# Patient Record
Sex: Female | Born: 1935
Health system: Southern US, Community
[De-identification: ages and names within clinical notes are randomized; demographics above are authoritative.]

## PROBLEM LIST (undated history)

## (undated) DIAGNOSIS — C50919 Malignant neoplasm of unspecified site of unspecified female breast: Secondary | ICD-10-CM

## (undated) DIAGNOSIS — H269 Unspecified cataract: Secondary | ICD-10-CM

## (undated) DIAGNOSIS — I10 Essential (primary) hypertension: Secondary | ICD-10-CM

## (undated) DIAGNOSIS — B159 Hepatitis A without hepatic coma: Secondary | ICD-10-CM

## (undated) DIAGNOSIS — R4189 Other symptoms and signs involving cognitive functions and awareness: Secondary | ICD-10-CM

## (undated) DIAGNOSIS — I639 Cerebral infarction, unspecified: Secondary | ICD-10-CM

## (undated) DIAGNOSIS — L253 Unspecified contact dermatitis due to other chemical products: Secondary | ICD-10-CM

## (undated) DIAGNOSIS — H3530 Unspecified macular degeneration: Secondary | ICD-10-CM

## (undated) DIAGNOSIS — H409 Unspecified glaucoma: Secondary | ICD-10-CM

## (undated) DIAGNOSIS — R7303 Prediabetes: Secondary | ICD-10-CM

## (undated) DIAGNOSIS — F419 Anxiety disorder, unspecified: Secondary | ICD-10-CM

## (undated) DIAGNOSIS — H5316 Psychophysical visual disturbances: Secondary | ICD-10-CM

## (undated) DIAGNOSIS — F0150 Vascular dementia without behavioral disturbance: Secondary | ICD-10-CM

## (undated) DIAGNOSIS — F411 Generalized anxiety disorder: Secondary | ICD-10-CM

## (undated) DIAGNOSIS — E785 Hyperlipidemia, unspecified: Secondary | ICD-10-CM

## (undated) DIAGNOSIS — K644 Residual hemorrhoidal skin tags: Secondary | ICD-10-CM

## (undated) DIAGNOSIS — M25559 Pain in unspecified hip: Secondary | ICD-10-CM

## (undated) DIAGNOSIS — I6990 Unspecified sequelae of unspecified cerebrovascular disease: Secondary | ICD-10-CM

## (undated) DIAGNOSIS — E559 Vitamin D deficiency, unspecified: Secondary | ICD-10-CM

## (undated) DIAGNOSIS — G453 Amaurosis fugax: Secondary | ICD-10-CM

## (undated) DIAGNOSIS — M21619 Bunion of unspecified foot: Secondary | ICD-10-CM

## (undated) DIAGNOSIS — R21 Rash and other nonspecific skin eruption: Secondary | ICD-10-CM

## (undated) DIAGNOSIS — K219 Gastro-esophageal reflux disease without esophagitis: Secondary | ICD-10-CM

## (undated) DIAGNOSIS — R609 Edema, unspecified: Secondary | ICD-10-CM

## (undated) DIAGNOSIS — R1310 Dysphagia, unspecified: Secondary | ICD-10-CM

## (undated) HISTORY — DX: Edema, unspecified: R60.9

## (undated) HISTORY — DX: Vitamin D deficiency, unspecified: E55.9

## (undated) HISTORY — DX: Essential (primary) hypertension: I10

## (undated) HISTORY — DX: Unspecified sequelae of unspecified cerebrovascular disease: I69.90

## (undated) HISTORY — DX: Unspecified contact dermatitis due to other chemical products: L25.3

## (undated) HISTORY — DX: Generalized anxiety disorder: F41.1

## (undated) HISTORY — DX: Unspecified cataract: H26.9

## (undated) HISTORY — DX: Gastro-esophageal reflux disease without esophagitis: K21.9

## (undated) HISTORY — DX: Residual hemorrhoidal skin tags: K64.4

## (undated) HISTORY — DX: Rash and other nonspecific skin eruption: R21

## (undated) HISTORY — DX: Bunion of unspecified foot: M21.619

## (undated) HISTORY — DX: Pain in unspecified hip: M25.559

## (undated) HISTORY — DX: Hepatitis a without hepatic coma: B15.9

## (undated) HISTORY — DX: Malignant neoplasm of unspecified site of unspecified female breast: C50.919

---

## 1970-05-23 HISTORY — PX: CHOLECYSTECTOMY: SHX55

## 1993-05-23 HISTORY — PX: BREAST LUMPECTOMY: SHX2

## 2002-05-23 HISTORY — PX: CARDIAC CATHETERIZATION: SHX172

## 2011-06-09 ENCOUNTER — Other Ambulatory Visit: Payer: Self-pay | Admitting: Internal Medicine

## 2011-06-09 DIAGNOSIS — Z780 Asymptomatic menopausal state: Secondary | ICD-10-CM

## 2011-06-16 ENCOUNTER — Other Ambulatory Visit: Payer: Self-pay

## 2012-08-13 ENCOUNTER — Encounter: Payer: Self-pay | Admitting: Internal Medicine

## 2012-08-13 ENCOUNTER — Ambulatory Visit (INDEPENDENT_AMBULATORY_CARE_PROVIDER_SITE_OTHER): Payer: Medicare Other | Admitting: Internal Medicine

## 2012-08-13 VITALS — BP 164/78 | HR 88 | Temp 97.8°F | Resp 20 | Ht 63.5 in | Wt 159.6 lb

## 2012-08-13 DIAGNOSIS — W1809XA Striking against other object with subsequent fall, initial encounter: Secondary | ICD-10-CM

## 2012-08-13 DIAGNOSIS — N390 Urinary tract infection, site not specified: Secondary | ICD-10-CM

## 2012-08-13 DIAGNOSIS — S0190XA Unspecified open wound of unspecified part of head, initial encounter: Secondary | ICD-10-CM

## 2012-08-13 MED ORDER — CIPROFLOXACIN HCL 500 MG PO TABS: 500.0000 mg | ORAL_TABLET | Freq: Two times a day (BID) | ORAL | Status: DC

## 2012-08-13 NOTE — Progress Notes (Signed)
Subjective:    Patient ID: Beth Foster, female    DOB: Jan 19, 1929, 79 y.o.   MRN: [id number]  HPI 79 yo female presents today with concerns about urinary frequency and dysuria. She fell this am.   Took azo for 2 days and one evening.  Helped in some ways, but gave her diarrhea.  Felt tachycardia.  Did not improve.  Did not take the azo this am--went 2-3 x this am.  Felt like she had a fever.  [place] Spaniel it was hot in waiting room.  Unable to make urine here b/c she says she will have a bm also.  Using depends.  Has been going on 3 nights and had incontinence.    Larey Seat this am and hit back of head on a door.  Back of head fell on threshold.  Says she was about to make a cup of coffee to take her medicine.  Turned around, felt a funny feeling, then fell. Bit inside of her lip also and it's sore.  Didn't injure arms or legs specifically though.  Has chronic left upper arm pain, but no changes.    BP unchanged.  Will not take additional meds and readings range in 140s-160s systolic.     Review of Systems General:  Denies fatigue, denies weight loss Skin:  Has chronic eczema, has laceration over posterior scalp that requires attention today s/p fall this am HEENT:  Notes headaches (has chronically with allergic rhinitis), denies visual changes, denies hearing loss, c/o chronic nasal congestion, denies sore throat, denies difficulty swallowing CV:  Denies chest pain, denies dyspnea on exertion, denies orthopnea, denies PND, denies edema Pulm:  Denies shortness of breath, denies wheezing, some chronic cough GI:  Denies abdominal pain, denies constipation, denies diarrhea, denies melena, denies hematochezia, denies nausea, denies vomiting GU:  c/o urinary frequency, c/o urinary urgency, c/o dysuria, c/o nocturia, c/o urinary incontinence Neurologic:  Denies paresthesias, denies new focal weakness MSK:  Denies joint pain, denies back pain, denies muscle pain, some chronic left upper arm discomfort Hematology:   Had bleeding from striking head this am, denies bruising Immunology:  Denies recurrent infections Psychiatry:  Denies changes in memory, denies depression, c/o anxiety     Objective:   Physical Exam  General:  Well-developed, well-nourished, well-groomed pt, not her usual self, is flushed, anxious Skin: 2 inch long laceration over posterior scalp which had stopped bleeding after area was cleaned with saline, area was cleansed, tincture of benzoin applied and steristrips to scalp, no bruises present at this time HEENT:   PERRLA, EOMI, some clear nasal drainage,mild PND CV:  RRR, no M/G/R, +S1/S2 Pulm:  Lungs CTA, no r/r/w Abd:  Soft, nondistended, nontender, no rebound or guarding, no palpable hepatosplenomegaly GU:  Bladder nontender, nondistended, no CVA tenderness MSK:  Mild right-sided rib pain, 5/5 strength in all 4 extremities with normal ROM, gait steady w/o use of assistive device Neuro:  CNs II-XII grossly intact, no focal deficits, no tremors Psych:  AAOx3, anxious today     Assessment & Plan:  1.  UTI:  Urine dipstick was grossly positive for UTI with >500 wbc, positive nitrite, 250 blood, and was orange in color from azo and cloudy --was started on a week's course of cipro --encouraged hydration with water and cranberry juice as well as yogurt 2.  H/o Fall:  Advised to take it easy over the next week until she recuperates from her infection, if remains weak, will advise PT 3.  Open wound to her head:  Was cleaned and dressed today with steristrips.  If is not healing, may require suturing, but bleeding had stopped.  Advised to avoid touching the area or washing her hair until I see her again next week to reevaluate the wound.  Let us know if she develops new weakness, increased confusion, signs of intracranial bleeding.    Pt's son had brought her to appt and all of this was also reviewed with him today and he expressed understanding.

## 2012-08-13 NOTE — Patient Instructions (Signed)
Please be extra careful walking around the next few days.    We will send your urine for a culture to identify the bacteria causing the infection.  I have given you antibiotics because you seem quite sick and I don't want your infection to get worse while we wait for the culture (may take a few days).  Please keep drinking plenty of water.    Do not mess with your wound on your head.  I will see you again in a week to see how it looks.

## 2012-08-15 LAB — CBC WITH DIFFERENTIAL/PLATELET
Basophils Absolute: 0 10*3/uL (ref 0.0–0.2)
Basos: 0 % (ref 0–3)
Eos: 1 % (ref 0–5)
Eosinophils Absolute: 0.1 10*3/uL (ref 0.0–0.4)
HCT: 40 % (ref 34.0–46.6)
Hemoglobin: 13.5 g/dL (ref 11.1–15.9)
Immature Grans (Abs): 0 10*3/uL (ref 0.0–0.1)
Immature Granulocytes: 0 % (ref 0–2)
Lymphocytes Absolute: 1.7 10*3/uL (ref 0.7–3.1)
Lymphs: 19 % (ref 14–46)
MCH: 31.3 pg (ref 26.6–33.0)
MCHC: 33.8 g/dL (ref 31.5–35.7)
MCV: 93 fL (ref 79–97)
Monocytes Absolute: 0.9 10*3/uL (ref 0.1–0.9)
Monocytes: 10 % (ref 4–12)
Neutrophils Absolute: 6.2 10*3/uL (ref 1.4–7.0)
Neutrophils Relative %: 70 % (ref 40–74)
RBC: 4.32 x10E6/uL (ref 3.77–5.28)
RDW: 13.2 % (ref 12.3–15.4)
WBC: 8.9 10*3/uL (ref 3.4–10.8)

## 2012-08-15 LAB — URINE CULTURE

## 2012-08-15 LAB — BASIC METABOLIC PANEL
BUN/Creatinine Ratio: 23 (ref 11–26)
BUN: 13 mg/dL (ref 8–27)
CO2: 23 mmol/L (ref 19–28)
Calcium: 9.2 mg/dL (ref 8.6–10.2)
Chloride: 100 mmol/L (ref 97–108)
Creatinine, Ser: 0.57 mg/dL (ref 0.57–1.00)
GFR calc Af Amer: 101 mL/min/{1.73_m2} (ref >59)
GFR calc non Af Amer: 87 mL/min/{1.73_m2} (ref >59)
Glucose: 96 mg/dL (ref 65–99)
Potassium: 3.9 mmol/L (ref 3.5–5.2)
Sodium: 138 mmol/L (ref 134–144)

## 2012-08-17 ENCOUNTER — Other Ambulatory Visit: Payer: Self-pay | Admitting: *Deleted

## 2012-08-17 DIAGNOSIS — I10 Essential (primary) hypertension: Secondary | ICD-10-CM

## 2012-08-17 DIAGNOSIS — E559 Vitamin D deficiency, unspecified: Secondary | ICD-10-CM

## 2012-08-20 ENCOUNTER — Ambulatory Visit: Payer: Medicare Other | Admitting: Internal Medicine

## 2012-08-20 ENCOUNTER — Encounter: Payer: Self-pay | Admitting: Internal Medicine

## 2012-08-20 ENCOUNTER — Ambulatory Visit (INDEPENDENT_AMBULATORY_CARE_PROVIDER_SITE_OTHER): Payer: Medicare Other | Admitting: Internal Medicine

## 2012-08-20 VITALS — BP 160/68 | HR 70 | Temp 96.1°F | Resp 16 | Ht 63.5 in | Wt 154.7 lb

## 2012-08-20 DIAGNOSIS — S0190XA Unspecified open wound of unspecified part of head, initial encounter: Secondary | ICD-10-CM | POA: Insufficient documentation

## 2012-08-20 DIAGNOSIS — G3184 Mild cognitive impairment, so stated: Secondary | ICD-10-CM | POA: Insufficient documentation

## 2012-08-20 DIAGNOSIS — R413 Other amnesia: Secondary | ICD-10-CM

## 2012-08-20 DIAGNOSIS — R269 Unspecified abnormalities of gait and mobility: Secondary | ICD-10-CM

## 2012-08-20 DIAGNOSIS — S0190XD Unspecified open wound of unspecified part of head, subsequent encounter: Secondary | ICD-10-CM

## 2012-08-20 DIAGNOSIS — E559 Vitamin D deficiency, unspecified: Secondary | ICD-10-CM | POA: Insufficient documentation

## 2012-08-20 DIAGNOSIS — Z5189 Encounter for other specified aftercare: Secondary | ICD-10-CM

## 2012-08-20 DIAGNOSIS — K219 Gastro-esophageal reflux disease without esophagitis: Secondary | ICD-10-CM

## 2012-08-20 DIAGNOSIS — N390 Urinary tract infection, site not specified: Secondary | ICD-10-CM

## 2012-08-20 NOTE — Assessment & Plan Note (Signed)
Had fall and was seen here last week.  Wound healing on head.  Balance improved some, but remains unsteady.  Discussed use of assistive device, but she is not keen on this.  Her granddaughter also would like a lightweight travel wheelchair for her for trips to the mall and long distances at the grocery store.  I ordered a PT, OT home health referral to assess home safety, assistive device and for exercise to help with balance.  She notes that she had home health therapy in the past with great benefit.  Handicapped tag also completed today so she will not have to walk so far.

## 2012-08-20 NOTE — Assessment & Plan Note (Signed)
Is no longer on vitamin D supplement per medication list.  Will recheck level before her upcoming visit.

## 2012-08-20 NOTE — Assessment & Plan Note (Signed)
Stable recently.  Continues on nexium which has been only successful PPI for this patient.

## 2012-08-20 NOTE — Assessment & Plan Note (Signed)
Dysuria and frequency resolved.  Using protective garments at night to prevent soiling her bed, but nocturia and incontinence seem to be resolved, as well.  Finishing her course of cipro, yogurt, adequate fluids.  Is generally feeling better now.

## 2012-08-20 NOTE — Progress Notes (Signed)
Patient ID: Beth Foster, female   DOB: Mar 13, 1929, 79 y.o.   MRN: [id number] Review of Systems  Constitutional: Negative for fever, chills and malaise/fatigue.  HENT: Negative for congestion.   Eyes: Negative for blurred vision.  Respiratory: Negative for cough and shortness of breath.   Cardiovascular: Negative for chest pain and palpitations.  Gastrointestinal: Negative for heartburn, nausea, vomiting, abdominal pain and constipation.       Gerd controlled with nexium  Genitourinary: Negative for dysuria, urgency and frequency.       See hpi.  Musculoskeletal: Negative for myalgias.  Skin: Negative for itching and rash.  Neurological: Positive for weakness. Negative for dizziness, loss of consciousness and headaches.       Poor balance.  No additional falls.  Endo/Heme/Allergies: Does not bruise/bleed easily.  Psychiatric/Behavioral: Positive for memory loss. Negative for depression. The patient is nervous/anxious. The patient does not have insomnia.    Physical Exam  Constitutional: She is oriented to person, place, and time. She appears well-developed and well-nourished.  HENT:  Head: Normocephalic.    Laceration/open wound on posterior scalp remains open in 1 cm area near inferior aspect of wound  Eyes: EOM are normal. Pupils are equal, round, and reactive to light.  Cardiovascular: Normal rate and regular rhythm.  Exam reveals no gallop and no friction rub.   No murmur heard. Pulmonary/Chest: Effort normal and breath sounds normal. No respiratory distress. She has no rales.  Abdominal: Soft. Bowel sounds are normal. She exhibits no distension. There is no tenderness.  Musculoskeletal: Normal range of motion. She exhibits no edema and no tenderness.  Neurological: She is alert and oriented to person, place, and time.  Gait still slightly unsteady, sways when walking  Skin: Skin is warm and dry.  See head picture  Psychiatric: She has a normal mood and affect.  anxious

## 2012-08-20 NOTE — Assessment & Plan Note (Signed)
Is healing slowly.  Still has a small ~1cm area open near the inferior portion of the wound.  I removed the steristrips today, cleansed the wound with normal saline and applied mupirocin cream to the open area.  Advised to keep open to heal.  She will wait until it closed to wash her hair.  I will reevaluate this 4/11.

## 2012-08-20 NOTE — Progress Notes (Signed)
Patient ID: Beth Foster, female   DOB: 08-18-1928, 79 y.o.   MRN: [id number] Code Status: Full code, has living will and granddaughter is her HCPOA  Allergies  Allergen Reactions  . Amoxicillin   . Cephalexin   . Clindamycin/Lincomycin   . Other     Beta blocker, ARBS, steroids  . Reglan (Metoclopramide)     Chief Complaint  Patient presents with  . Medical Managment of Chronic Issues  . check wound back of head    HPI: Patient is a 79 y.o. with h/o allergic rhinitis and eczema, cognitive impairment  Granddaughter to get necklace for her to wear to hit call button if she falls.    Gets winded easily.  Asks about traveling wheelchair and handicapped plaquard due to gait imbalance.    Blood pressure runs high and she will not take more medicine for it.  Dysuria improving.  Feels a lot better.  Drinking plenty of water now.  Has had some nocturnal incontinence.  No longer getting up 2-3 x per night since uti treatment.  Finishing course of cipro.  Review of Systems:  ROS   Past Medical History  Diagnosis Date  . GERD (gastroesophageal reflux disease)   . Hypertension   . Pain in joint, pelvic region and thigh   . Vitamin D deficiency   . Viral hepatitis A without mention of hepatic coma   . Malignant neoplasm of breast (female), unspecified site   . Anxiety state, unspecified   . Unspecified cataract   . Unspecified late effects of cerebrovascular disease   . External hemorrhoids without mention of complication   . Contact dermatitis and other eczema due to other chemical products   . Bunion   . Rash and other nonspecific skin eruption   . Edema    History reviewed. No pertinent past surgical history. Social History:   reports that she has never smoked. She does not have any smokeless tobacco history on file. She reports that she does not drink alcohol or use illicit drugs.  No family history on file.  Medications: Patient's Medications  New Prescriptions   No  medications on file  Previous Medications   CHOLECALCIFEROL (VITAMIN D) 1000 UNITS TABLET    Take 1,000 Units by mouth daily. Take one tablet once a day for vitamin d supplement   CIPROFLOXACIN (CIPRO) 500 MG TABLET    Take 1 tablet (500 mg total) by mouth 2 (two) times daily.   DILTIAZEM (CARDIZEM) 120 MG TABLET    Take one tablet once a day for blood pressure   ESOMEPRAZOLE (NEXIUM) 20 MG CAPSULE    Take 20 mg by mouth daily before breakfast. Take one tablet once a day for acid reflux   MULTIPLE VITAMIN (MULTIVITAMIN) TABLET    Take 1 tablet by mouth daily. Take one tablet once a day  Modified Medications   No medications on file  Discontinued Medications   No medications on file    Physical Exam:  Filed Vitals:   08/20/12 1008  BP: 160/68  Pulse: 70  Temp: 96.1 F (35.6 C)  Resp: 16  Height: 5' 3.5" (1.613 m)  Weight: 154 lb 11.2 oz (70.171 kg)   Physical Exam    Labs reviewed: 08/01/2011 Lipid Panel; Cholesterol 212, Triglycerides 53, HDL 82, LDL 119 10/14/2011  BMP: glucose 102, BUN 10, Creatinine 0.60 02/29/2012  CMP: glucose 112, BUN 7, Creatinine 0.75 Lipid: Cholesterol 224, Triglycerides 76, HDL 88, LDL 121 Vitamin D: 21.7 05/30/2012 BMP; Glucose  105, BUN 8, Creatinine 0.73 CBC; WBC 5.3, RBC 4.54, HGB 14.5 Vit D 25.3 Basic Metabolic Panel:  Recent Labs  16/10/96 1647  NA 138  K 3.9  CL 100  CO2 23  GLUCOSE 96  BUN 13  CREATININE 0.57  CALCIUM 9.2   CBC:  Recent Labs  08/13/12 1647  WBC 8.9  NEUTROABS 6.2  HGB 13.5  HCT 40.0  MCV 93    Past Procedures: never had cscope--has had hemorrhoids last mammogram several years ago had bone density about 10 years ago had stress test with terrible pain reaction in chest, then had cath and got hypotensive--advised to have no further caths 08/18/11:  MMSE:  28/30, passed clock drawing here at [facility]  Assessment/Plan    Labs/tests ordered:  Has BMP, Vit D, bone density ordered for before visit  08/31/12

## 2012-08-20 NOTE — Assessment & Plan Note (Signed)
MMSE:  28/30, passed clock drawing here at [facility] Having some more difficulty being home alone.  See gait disorder.

## 2012-08-22 DIAGNOSIS — R269 Unspecified abnormalities of gait and mobility: Secondary | ICD-10-CM

## 2012-08-22 DIAGNOSIS — G622 Polyneuropathy due to other toxic agents: Secondary | ICD-10-CM

## 2012-08-22 DIAGNOSIS — I10 Essential (primary) hypertension: Secondary | ICD-10-CM

## 2012-08-22 DIAGNOSIS — G619 Inflammatory polyneuropathy, unspecified: Secondary | ICD-10-CM

## 2012-08-22 DIAGNOSIS — I6990 Unspecified sequelae of unspecified cerebrovascular disease: Secondary | ICD-10-CM

## 2012-08-29 ENCOUNTER — Other Ambulatory Visit: Payer: Medicare Other

## 2012-08-29 DIAGNOSIS — I10 Essential (primary) hypertension: Secondary | ICD-10-CM

## 2012-08-29 DIAGNOSIS — E559 Vitamin D deficiency, unspecified: Secondary | ICD-10-CM

## 2012-08-30 LAB — BASIC METABOLIC PANEL
BUN/Creatinine Ratio: 17 (ref 11–26)
BUN: 13 mg/dL (ref 8–27)
CO2: 25 mmol/L (ref 19–28)
Calcium: 9.9 mg/dL (ref 8.6–10.2)
Chloride: 103 mmol/L (ref 97–108)
Creatinine, Ser: 0.76 mg/dL (ref 0.57–1.00)
GFR calc Af Amer: 85 mL/min/{1.73_m2} (ref >59)
GFR calc non Af Amer: 74 mL/min/{1.73_m2} (ref >59)
Glucose: 110 mg/dL — ABNORMAL HIGH (ref 65–99)
Potassium: 5.5 mmol/L — ABNORMAL HIGH (ref 3.5–5.2)
Sodium: 142 mmol/L (ref 134–144)

## 2012-08-30 LAB — VITAMIN D 25 HYDROXY (VIT D DEFICIENCY, FRACTURES): Vit D, 25-Hydroxy: 24.9 ng/mL — ABNORMAL LOW (ref 30.0–100.0)

## 2012-08-31 ENCOUNTER — Ambulatory Visit (INDEPENDENT_AMBULATORY_CARE_PROVIDER_SITE_OTHER): Payer: Medicare Other | Admitting: Internal Medicine

## 2012-08-31 ENCOUNTER — Encounter: Payer: Self-pay | Admitting: Internal Medicine

## 2012-08-31 ENCOUNTER — Telehealth: Payer: Self-pay | Admitting: *Deleted

## 2012-08-31 VITALS — BP 162/88 | HR 79 | Temp 97.6°F | Resp 16 | Ht 63.5 in | Wt 158.6 lb

## 2012-08-31 DIAGNOSIS — Z5189 Encounter for other specified aftercare: Secondary | ICD-10-CM

## 2012-08-31 DIAGNOSIS — R269 Unspecified abnormalities of gait and mobility: Secondary | ICD-10-CM

## 2012-08-31 DIAGNOSIS — S0190XD Unspecified open wound of unspecified part of head, subsequent encounter: Secondary | ICD-10-CM

## 2012-08-31 DIAGNOSIS — E785 Hyperlipidemia, unspecified: Secondary | ICD-10-CM

## 2012-08-31 DIAGNOSIS — E559 Vitamin D deficiency, unspecified: Secondary | ICD-10-CM

## 2012-08-31 NOTE — Assessment & Plan Note (Signed)
Balance was affected during UTI. Does have some difficulty with it at baseline.  Started with therapy last week.

## 2012-08-31 NOTE — Telephone Encounter (Signed)
Patient came in this morning and saw Dr Renato Gails and her blood pressure was 162/88 she had not taken her blood  pressure at that time. Beth Foster( [facility])  stated that patient blood pressure is 166/84. I called Dr  Renato Gails and she stated that it is noted

## 2012-08-31 NOTE — Progress Notes (Signed)
Patient ID: Beth Foster, female   DOB: May 29, 1928, 79 y.o.   MRN: [id number]  Allergies  Allergen Reactions  . Amoxicillin   . Cephalexin   . Clindamycin/Lincomycin   . Other     Beta blocker, ARBS, steroids  . Reglan (Metoclopramide)     Chief Complaint  Patient presents with  . Medical Managment of Chronic Issues    HPI: Patient is a 79 y.o. Caucasian female seen in the office today for management of her chronic conditions.  Still having some headaches since her fall.  Balance is slowly getting better.  Still feels like she will fall backwards every once in a while.  Holds onto things.  She has no other new sequelae since her fall.    Right eye is bothering her and says it has since she's fallen though she has not mentioned it before this.  Tears up and feels like it's swollen.  Has been coughing more.    Hasn't taken her meds, coffee or breakfast.    Does tell me that she had previously had a hemorrhagic stroke 3 years ago which resolved spontaneously.    Her potassium was a little elevated this time.  Eats a daily banana b/c of her lasix.  Review of Systems:  Review of Systems  Constitutional: Negative for fever, chills, weight loss and malaise/fatigue.  HENT: Negative for congestion and neck pain.        Right eye watering  Eyes: Negative for blurred vision.  Respiratory: Positive for cough. Negative for wheezing.        Mild dyspnea on exertion  Cardiovascular: Negative for chest pain and leg swelling.  Gastrointestinal: Negative for constipation.  Genitourinary: Negative for dysuria, urgency and frequency.  Musculoskeletal: Negative for myalgias.  Skin: Negative for rash.       Wound healing  Neurological: Positive for headaches. Negative for dizziness, tingling, tremors, sensory change, speech change, focal weakness, seizures and loss of consciousness.       Some lightheadedness  Psychiatric/Behavioral: Positive for memory loss. Negative for depression. The patient is  nervous/anxious.      Past Medical History  Diagnosis Date  . GERD (gastroesophageal reflux disease)   . Hypertension   . Pain in joint, pelvic region and thigh   . Vitamin D deficiency   . Viral hepatitis A without mention of hepatic coma   . Malignant neoplasm of breast (female), unspecified site   . Anxiety state, unspecified   . Unspecified cataract   . Unspecified late effects of cerebrovascular disease   . External hemorrhoids without mention of complication   . Contact dermatitis and other eczema due to other chemical products   . Bunion   . Rash and other nonspecific skin eruption   . Edema    No past surgical history on file. Social History:   reports that she has never smoked. She does not have any smokeless tobacco history on file. She reports that she does not drink alcohol or use illicit drugs.  No family history on file.  Medications: Patient's Medications  New Prescriptions   No medications on file  Previous Medications   CHOLECALCIFEROL (VITAMIN D) 1000 UNITS TABLET    Take 1,000 Units by mouth daily. Take one tablet once a day for vitamin d supplement   DILTIAZEM (CARDIZEM) 120 MG TABLET    Take one tablet once a day for blood pressure   ESOMEPRAZOLE (NEXIUM) 20 MG CAPSULE    Take 20 mg by mouth daily before  breakfast. Take one tablet once a day for acid reflux   MULTIPLE VITAMIN (MULTIVITAMIN) TABLET    Take 1 tablet by mouth daily. Take one tablet once a day  Modified Medications   No medications on file  Discontinued Medications   CIPROFLOXACIN (CIPRO) 500 MG TABLET    Take 1 tablet (500 mg total) by mouth 2 (two) times daily.     Physical Exam:  Filed Vitals:   08/31/12 0825  BP: 162/88  Pulse: 79  Temp: 97.6 F (36.4 C)  TempSrc: Oral  Resp: 16  Height: 5' 3.5" (1.613 m)  Weight: 158 lb 9.6 oz (71.94 kg)   Physical Exam  Constitutional: She appears well-developed and well-nourished. No distress.  HENT:  Postnasal drip present  Eyes:  Conjunctivae and EOM are normal. Pupils are equal, round, and reactive to light. Right eye exhibits discharge. Left eye exhibits no discharge.  Cardiovascular: Normal rate, regular rhythm, normal heart sounds and intact distal pulses.  Exam reveals no gallop and no friction rub.   No murmur heard. Pulmonary/Chest: Effort normal and breath sounds normal. No respiratory distress. She has no wheezes.  Abdominal: Bowel sounds are normal.  Musculoskeletal: Normal range of motion. She exhibits no edema and no tenderness.  Neurological: She is alert. She has normal reflexes. No cranial nerve deficit. She exhibits normal muscle tone. Coordination normal.  Oriented x 2, not to exact date  Skin: Skin is warm and dry. She is not diaphoretic.  Psychiatric: She has a normal mood and affect. Her behavior is normal.   Labs reviewed: Basic Metabolic Panel:  Recent Labs  40/98/11 1647 08/29/12 0810  NA 138 142  K 3.9 5.5*  CL 100 103  CO2 23 25  GLUCOSE 96 110*  BUN 13 13  CREATININE 0.57 0.76  CALCIUM 9.2 9.9   CBC:  Recent Labs  08/13/12 1647  WBC 8.9  NEUTROABS 6.2  HGB 13.5  HCT 40.0  MCV 93   Assessment/Plan Wound, open, head Is now entirely closed.  Improving slowly overall.     Gait disorder Balance was affected during UTI. Does have some difficulty with it at baseline.  Started with therapy last week.    Unspecified vitamin D deficiency Encouraged to take her vitamin D supplement.  Hyperlipidemia Check flp before next visit.  Labs/tests ordered:  Cbc, cmp, flp before next visit.  Note, she has no neurologic deficits to suggest she's had any intracranial bleeding.  Suspect she's had a concussion and this is causing her headaches.

## 2012-08-31 NOTE — Assessment & Plan Note (Signed)
Encouraged to take her vitamin D supplement.

## 2012-08-31 NOTE — Assessment & Plan Note (Signed)
Is now entirely closed.  Improving slowly overall.

## 2012-09-03 ENCOUNTER — Telehealth: Payer: Self-pay | Admitting: *Deleted

## 2012-09-03 NOTE — Telephone Encounter (Signed)
Alveria Apley, Occupational Therapy  from [facility] and she stated that patient blood pressure was 170/90 Pulse 80. Patient stated that she did take her medications. Lynden Ang will not perform therapy for patient. They also would like an order for [facility] with wheels and seat   Lynden Ang [phone] cell [phone] Office

## 2012-09-04 ENCOUNTER — Encounter: Payer: Self-pay | Admitting: Internal Medicine

## 2012-09-04 NOTE — Telephone Encounter (Signed)
Written prescription was done for the walker due to gait disorder and recent fall.  Patient has refused any alternative or new medications to be added to her list.  She has multiple allergies and frequent adverse reactions.

## 2012-09-17 ENCOUNTER — Other Ambulatory Visit: Payer: Self-pay | Admitting: *Deleted

## 2012-09-17 MED ORDER — DILTIAZEM HCL 120 MG PO TABS: ORAL_TABLET | ORAL | Status: DC

## 2012-09-19 ENCOUNTER — Other Ambulatory Visit: Payer: Self-pay | Admitting: *Deleted

## 2012-09-19 ENCOUNTER — Telehealth: Payer: Self-pay | Admitting: *Deleted

## 2012-09-19 MED ORDER — DILTIAZEM HCL 120 MG PO TABS: ORAL_TABLET | ORAL | Status: DC

## 2012-09-19 NOTE — Telephone Encounter (Signed)
[facility] dropped patient occupational therapy only she has meet her goals.  She thinks Physical therapy is still seeing her

## 2012-09-20 ENCOUNTER — Other Ambulatory Visit: Payer: Self-pay | Admitting: *Deleted

## 2012-09-20 MED ORDER — DILTIAZEM HCL 120 MG PO TABS: ORAL_TABLET | ORAL | Status: DC

## 2012-10-09 ENCOUNTER — Telehealth: Payer: Self-pay | Admitting: *Deleted

## 2012-10-09 ENCOUNTER — Encounter (HOSPITAL_COMMUNITY): Payer: Self-pay | Admitting: Emergency Medicine

## 2012-10-09 ENCOUNTER — Emergency Department (HOSPITAL_COMMUNITY): Payer: Medicare Other

## 2012-10-09 ENCOUNTER — Emergency Department (HOSPITAL_COMMUNITY)
Admission: EM | Admit: 2012-10-09 | Discharge: 2012-10-09 | Disposition: A | Discharge status: Home / Self Care | Payer: Medicare Other | Attending: Emergency Medicine | Admitting: Emergency Medicine

## 2012-10-09 DIAGNOSIS — Z853 Personal history of malignant neoplasm of breast: Secondary | ICD-10-CM | POA: Insufficient documentation

## 2012-10-09 DIAGNOSIS — Z8719 Personal history of other diseases of the digestive system: Secondary | ICD-10-CM | POA: Insufficient documentation

## 2012-10-09 DIAGNOSIS — Z8739 Personal history of other diseases of the musculoskeletal system and connective tissue: Secondary | ICD-10-CM | POA: Insufficient documentation

## 2012-10-09 DIAGNOSIS — Z880 Allergy status to penicillin: Secondary | ICD-10-CM | POA: Insufficient documentation

## 2012-10-09 DIAGNOSIS — Z8669 Personal history of other diseases of the nervous system and sense organs: Secondary | ICD-10-CM | POA: Insufficient documentation

## 2012-10-09 DIAGNOSIS — I10 Essential (primary) hypertension: Secondary | ICD-10-CM | POA: Insufficient documentation

## 2012-10-09 DIAGNOSIS — E559 Vitamin D deficiency, unspecified: Secondary | ICD-10-CM | POA: Insufficient documentation

## 2012-10-09 DIAGNOSIS — Z872 Personal history of diseases of the skin and subcutaneous tissue: Secondary | ICD-10-CM | POA: Insufficient documentation

## 2012-10-09 DIAGNOSIS — R220 Localized swelling, mass and lump, head: Secondary | ICD-10-CM

## 2012-10-09 DIAGNOSIS — Y9389 Activity, other specified: Secondary | ICD-10-CM | POA: Insufficient documentation

## 2012-10-09 DIAGNOSIS — Z79899 Other long term (current) drug therapy: Secondary | ICD-10-CM | POA: Insufficient documentation

## 2012-10-09 DIAGNOSIS — Z8619 Personal history of other infectious and parasitic diseases: Secondary | ICD-10-CM | POA: Insufficient documentation

## 2012-10-09 DIAGNOSIS — S0990XA Unspecified injury of head, initial encounter: Secondary | ICD-10-CM | POA: Insufficient documentation

## 2012-10-09 DIAGNOSIS — Z8679 Personal history of other diseases of the circulatory system: Secondary | ICD-10-CM | POA: Insufficient documentation

## 2012-10-09 DIAGNOSIS — Y9289 Other specified places as the place of occurrence of the external cause: Secondary | ICD-10-CM | POA: Insufficient documentation

## 2012-10-09 DIAGNOSIS — W19XXXA Unspecified fall, initial encounter: Secondary | ICD-10-CM | POA: Insufficient documentation

## 2012-10-09 DIAGNOSIS — S0993XA Unspecified injury of face, initial encounter: Secondary | ICD-10-CM | POA: Insufficient documentation

## 2012-10-09 DIAGNOSIS — Z8659 Personal history of other mental and behavioral disorders: Secondary | ICD-10-CM | POA: Insufficient documentation

## 2012-10-09 LAB — URINALYSIS, ROUTINE W REFLEX MICROSCOPIC
Bilirubin Urine: NEGATIVE
Glucose, UA: NEGATIVE mg/dL
Hgb urine dipstick: NEGATIVE
Ketones, ur: NEGATIVE mg/dL
Nitrite: NEGATIVE
Protein, ur: NEGATIVE mg/dL
Specific Gravity, Urine: 1.011 (ref 1.005–1.030)
Urobilinogen, UA: 0.2 mg/dL (ref 0.0–1.0)
pH: 6.5 (ref 5.0–8.0)

## 2012-10-09 LAB — CBC WITH DIFFERENTIAL/PLATELET
Basophils Absolute: 0 10*3/uL (ref 0.0–0.1)
Basophils Relative: 0 % (ref 0–1)
Eosinophils Absolute: 0.1 10*3/uL (ref 0.0–0.7)
Eosinophils Relative: 3 % (ref 0–5)
HCT: 41.3 % (ref 36.0–46.0)
Hemoglobin: 14.1 g/dL (ref 12.0–15.0)
Lymphocytes Relative: 37 % (ref 12–46)
Lymphs Abs: 1.9 10*3/uL (ref 0.7–4.0)
MCH: 32.3 pg (ref 26.0–34.0)
MCHC: 34.1 g/dL (ref 30.0–36.0)
MCV: 94.7 fL (ref 78.0–100.0)
Monocytes Absolute: 0.4 10*3/uL (ref 0.1–1.0)
Monocytes Relative: 7 % (ref 3–12)
Neutro Abs: 2.7 10*3/uL (ref 1.7–7.7)
Neutrophils Relative %: 53 % (ref 43–77)
Platelets: 211 10*3/uL (ref 150–400)
RBC: 4.36 MIL/uL (ref 3.87–5.11)
RDW: 13 % (ref 11.5–15.5)
WBC: 5.1 10*3/uL (ref 4.0–10.5)

## 2012-10-09 LAB — BASIC METABOLIC PANEL
BUN: 9 mg/dL (ref 6–23)
CO2: 22 mEq/L (ref 19–32)
Calcium: 9.7 mg/dL (ref 8.4–10.5)
Chloride: 104 mEq/L (ref 96–112)
Creatinine, Ser: 0.61 mg/dL (ref 0.50–1.10)
GFR calc Af Amer: >90 mL/min (ref >90)
GFR calc non Af Amer: 83 mL/min — ABNORMAL LOW (ref >90)
Glucose, Bld: 200 mg/dL — ABNORMAL HIGH (ref 70–99)
Potassium: 4.4 mEq/L (ref 3.5–5.1)
Sodium: 139 mEq/L (ref 135–145)

## 2012-10-09 LAB — SEDIMENTATION RATE: Sed Rate: 10 mm/hr (ref 0–22)

## 2012-10-09 LAB — URINE MICROSCOPIC-ADD ON

## 2012-10-09 NOTE — ED Provider Notes (Signed)
History     CSN: [id number]  Arrival date & time 10/09/12  1327   First MD Initiated Contact with Patient 10/09/12 1504      Chief Complaint  Patient presents with  . Fall    (Consider location/radiation/quality/duration/timing/severity/associated sxs/prior treatment) Patient is a 79 y.o. female presenting with fall. The history is provided by the patient.  Fall   patient here complaining of right-sided facial swelling symptoms started 3 days ago and has been localized to the lateral part of her right face. Denies any vision loss. Some pain in her right temple area. No fever or chills no neck pain or photophobia. No upper or lower extremity weakness. No confusion at all no slurred speech. Family has possibly noticed some facial drooping. Patient used ice on her face and the swelling is improved.  Past Medical History  Diagnosis Date  . GERD (gastroesophageal reflux disease)   . Hypertension   . Pain in joint, pelvic region and thigh   . Vitamin D deficiency   . Viral hepatitis A without mention of hepatic coma   . Malignant neoplasm of breast (female), unspecified site   . Anxiety state, unspecified   . Unspecified cataract   . Unspecified late effects of cerebrovascular disease   . External hemorrhoids without mention of complication   . Contact dermatitis and other eczema due to other chemical products   . Bunion   . Rash and other nonspecific skin eruption   . Edema     No past surgical history on file.  No family history on file.  History  Substance Use Topics  . Smoking status: Never Smoker   . Smokeless tobacco: Not on file  . Alcohol Use: No    OB History   Grav Para Term Preterm Abortions TAB SAB Ect Mult Living                  Review of Systems  All other systems reviewed and are negative.    Allergies  Amoxicillin; Cephalexin; Clindamycin/lincomycin; Other; and Reglan  Home Medications   Current Outpatient Rx  Name  Route  Sig  Dispense   Refill  . cholecalciferol (VITAMIN D) 1000 UNITS tablet   Oral   Take 1,000 Units by mouth daily.          Marland Kitchen diltiazem (TIAZAC) 120 MG 24 hr capsule   Oral   Take 120 mg by mouth daily.         . Multiple Vitamin (MULTIVITAMIN) tablet   Oral   Take 1 tablet by mouth daily.            BP 157/86  Pulse 101  Temp(Src) 98.2 F (36.8 C) (Oral)  Resp 16  SpO2 96%  Physical Exam  Nursing note and vitals reviewed. Constitutional: She is oriented to person, place, and time. She appears well-developed and well-nourished.  Non-toxic appearance. No distress.  HENT:  Head: Normocephalic and atraumatic.  Eyes: Conjunctivae, EOM and lids are normal. Pupils are equal, round, and reactive to light.  Neck: Normal range of motion. Neck supple. No tracheal deviation present. No mass present.  Cardiovascular: Normal rate, regular rhythm and normal heart sounds.  Exam reveals no gallop.   No murmur heard. Pulmonary/Chest: Effort normal and breath sounds normal. No stridor. No respiratory distress. She has no decreased breath sounds. She has no wheezes. She has no rhonchi. She has no rales.  Abdominal: Soft. Normal appearance and bowel sounds are normal. She exhibits no distension.  There is no tenderness. There is no rebound and no CVA tenderness.  Musculoskeletal: Normal range of motion. She exhibits no edema and no tenderness.  Neurological: She is alert and oriented to person, place, and time. She has normal strength. No cranial nerve deficit or sensory deficit. GCS eye subscore is 4. GCS verbal subscore is 5. GCS motor subscore is 6.  Skin: Skin is warm and dry. No abrasion and no rash noted.  Psychiatric: She has a normal mood and affect. Her speech is normal and behavior is normal.    ED Course  Procedures (including critical care time)  Labs Reviewed  BASIC METABOLIC PANEL - Abnormal; Notable for the following:    Glucose, Bld 200 (*)    GFR calc non Af Amer 83 (*)    All other  components within normal limits  CBC WITH DIFFERENTIAL  URINALYSIS, ROUTINE W REFLEX MICROSCOPIC  SEDIMENTATION RATE   No results found.   No diagnosis found.    MDM  Patient with negative workup here. No signs of cellulitis. Possible allergic reaction as her symptoms are responsive to ice. We'll discharge home        Toy Baker, MD 10/09/12 [phone]

## 2012-10-09 NOTE — ED Notes (Signed)
Pt c/o rt sided head, neck  pain and eye pain that began after a fall on Sunday. Pt rates pain 5/10. Pt has visible drooping of the rt eye. Family at bedside.

## 2012-10-09 NOTE — ED Notes (Signed)
Patient acuity changed after speaking with Anne Shutter, PA.   Due to patient history and new facial drooping, patient acuity bumped.

## 2012-10-09 NOTE — Telephone Encounter (Signed)
Beth Foster called  And stated that her grandmother woke up with pressure and pain on right side of jaw. Swollen as well. I asked her to take her to the ED or Urgent care since we did not have any availability in the office. She stated that she would.

## 2012-10-09 NOTE — ED Notes (Signed)
Fell 3-4 weeks ago and had bad gash on back of head  Now rt eye swollen since Sunday  It has gotten better w/ ice ,no blurred vision since Sunday just pain did not get scan when she fell she states

## 2012-10-09 NOTE — ED Notes (Signed)
Per PA, patient will be sent back to triage.  Not appropriate for FT.  Patient already in queue for room per triage.

## 2012-10-09 NOTE — ED Notes (Addendum)
Per patient and daughter, patient has been persistently weak in extremities and unsteadiness since her fall.  Patient has had facial droop and swelling with a lot of pain behind eye.   Patient states history of CVA.  [place], PA advised of assessment findings.

## 2012-10-31 ENCOUNTER — Encounter: Payer: Self-pay | Admitting: *Deleted

## 2012-11-01 ENCOUNTER — Ambulatory Visit (INDEPENDENT_AMBULATORY_CARE_PROVIDER_SITE_OTHER): Payer: Medicare Other | Admitting: Internal Medicine

## 2012-11-01 ENCOUNTER — Encounter: Payer: Self-pay | Admitting: Internal Medicine

## 2012-11-01 VITALS — BP 148/92 | HR 76 | Temp 98.0°F | Resp 14 | Ht 63.5 in | Wt 158.0 lb

## 2012-11-01 DIAGNOSIS — N6310 Unspecified lump in the right breast, unspecified quadrant: Secondary | ICD-10-CM

## 2012-11-01 DIAGNOSIS — C50919 Malignant neoplasm of unspecified site of unspecified female breast: Secondary | ICD-10-CM | POA: Insufficient documentation

## 2012-11-01 DIAGNOSIS — N644 Mastodynia: Secondary | ICD-10-CM

## 2012-11-01 NOTE — Patient Instructions (Addendum)
[facility] and ultrasound of right breast.

## 2012-11-01 NOTE — Progress Notes (Signed)
Patient ID: Beth Foster, female   DOB: 1928/08/02, 79 y.o.   MRN: [id number]  Allergies  Allergen Reactions  . Amoxicillin   . Cephalexin   . Clindamycin/Lincomycin   . Other     Beta blocker, ARBS, steroids  . Reglan (Metoclopramide)     Chief Complaint  Patient presents with  . Breast Pain    right    HPI: Patient is a 79 y.o. white female with h/o right breast cancer s/p lumpectomy with repeat surgery to "get it all" and radiation in 1996  seen in the office today for new concerns about right breast.  Had refused mammogram up to this point.  Now agreeable.    Right breast was very painful for 5 days.  Was taking tylenol with some benefit.  Not as sore as it had been.  Right breast where had cancer removed in past--has markers in place from prior cancer site.   Has evidence of scar, and very hard and swollen above the place where her mass was removed.  Review of Systems:  Review of Systems  Constitutional: Negative for weight loss.  Eyes: Negative for blurred vision.  Respiratory: Negative for shortness of breath.   Cardiovascular: Negative for chest pain, palpitations and leg swelling.       Right breast tender and c/o palpable mass as above  Gastrointestinal: Negative for abdominal pain.  Genitourinary: Negative for dysuria.  Skin:       Chronic eczema  Neurological: Positive for dizziness. Negative for weakness and headaches.  Endo/Heme/Allergies: Bruises/bleeds easily.  Psychiatric/Behavioral: Positive for memory loss. The patient is nervous/anxious.        Mild     Past Medical History  Diagnosis Date  . GERD (gastroesophageal reflux disease)   . Hypertension   . Pain in joint, pelvic region and thigh   . Vitamin D deficiency   . Viral hepatitis A without mention of hepatic coma   . Malignant neoplasm of breast (female), unspecified site   . Anxiety state, unspecified   . Unspecified cataract   . Unspecified late effects of cerebrovascular disease   . External  hemorrhoids without mention of complication   . Contact dermatitis and other eczema due to other chemical products   . Bunion   . Rash and other nonspecific skin eruption   . Edema   . Viral hepatitis A without mention of hepatic coma    Past Surgical History  Procedure Laterality Date  . Cholecystectomy  1972  . Breast lumpectomy  1995  . Cardiac catheterization  2004   Social History:   reports that she has never smoked. She does not have any smokeless tobacco history on file. She reports that she does not drink alcohol or use illicit drugs.  Family History  Problem Relation Age of Onset  . Cancer Mother     lung  . Cancer Brother     brain    Medications: Patient's Medications  New Prescriptions   No medications on file  Previous Medications   CHOLECALCIFEROL (VITAMIN D) 1000 UNITS TABLET    Take 1,000 Units by mouth daily.    DILTIAZEM (DILACOR XR) 120 MG 24 HR CAPSULE       MULTIPLE VITAMIN (MULTIVITAMIN) TABLET    Take 1 tablet by mouth daily.   Modified Medications   No medications on file  Discontinued Medications   No medications on file     Physical Exam:  Filed Vitals:   11/01/12 1107  BP: 148/92  Pulse: 76  Temp: 98 F (36.7 C)  TempSrc: Oral  Resp: 14  Height: 5' 3.5" (1.613 m)  Weight: 158 lb (71.668 kg)   Physical Exam  Constitutional: She is oriented to person, place, and time. She appears well-developed and well-nourished. No distress.  HENT:  Head: Normocephalic and atraumatic.  Neck: No JVD present.  Cardiovascular: Normal rate, regular rhythm, normal heart sounds and intact distal pulses.   Pulmonary/Chest: Effort normal and breath sounds normal. Right breast exhibits mass, skin change and tenderness. Right breast exhibits no inverted nipple and no nipple discharge. Breasts are asymmetrical. There is breast swelling.    Right breast with tenderness and palpable mass around 12 o'clock   Telangiectasias are present in the area where her  prior lumpectomy was around 12 o'clock   No axillary adenopathy  Abdominal: Soft. Bowel sounds are normal. She exhibits no distension and no mass. There is no tenderness.  Musculoskeletal: Normal range of motion.  Gait slightly unsteady, not using assistive device despite recommendations  Lymphadenopathy:    She has no cervical adenopathy.  Neurological: She is alert and oriented to person, place, and time.  Psychiatric: She has a normal mood and affect.   Labs reviewed: Basic Metabolic Panel:  Recent Labs  16/10/96 1647 08/29/12 0810 10/09/12 1432  NA 138 142 139  K 3.9 5.5* 4.4  CL 100 103 104  CO2 23 25 22   GLUCOSE 96 110* 200*  BUN 13 13 9   CREATININE 0.57 0.76 0.61  CALCIUM 9.2 9.9 9.7  CBC:  Recent Labs  08/13/12 1647 10/09/12 1432  WBC 8.9 5.1  NEUTROABS 6.2 2.7  HGB 13.5 14.1  HCT 40.0 41.3  MCV 93 94.7  PLT  --  211    Assessment/Plan Malignant neoplasm of breast (female), unspecified site Right breast with prior lumpectomy and radiation therapy.  Now with new pain.  Unfortunately, I do not have any detailed records about her prior treatments.  She has refused any mammography since she has been coming to see me until she developed this new pain within the past week.  She has had no weight loss.    Painful lumpy right breast - US Breast Right; Future - MM Digital Diagnostic Bilat; Future

## 2012-11-03 ENCOUNTER — Encounter: Payer: Self-pay | Admitting: Internal Medicine

## 2012-11-03 NOTE — Assessment & Plan Note (Signed)
Right breast with prior lumpectomy and radiation therapy.  Now with new pain.  Unfortunately, I do not have any detailed records about her prior treatments.  She has refused any mammography since she has been coming to see me until she developed this new pain within the past week.  She has had no weight loss.

## 2012-11-14 ENCOUNTER — Other Ambulatory Visit: Payer: Medicare Other

## 2012-12-03 ENCOUNTER — Ambulatory Visit
Admission: RE | Admit: 2012-12-03 | Discharge: 2012-12-03 | Disposition: A | Discharge status: Home / Self Care | Payer: Medicare Other | Source: Ambulatory Visit | Attending: Internal Medicine | Admitting: Internal Medicine

## 2012-12-03 DIAGNOSIS — C50919 Malignant neoplasm of unspecified site of unspecified female breast: Secondary | ICD-10-CM

## 2012-12-03 DIAGNOSIS — N6310 Unspecified lump in the right breast, unspecified quadrant: Secondary | ICD-10-CM

## 2012-12-03 DIAGNOSIS — N644 Mastodynia: Secondary | ICD-10-CM

## 2012-12-04 ENCOUNTER — Other Ambulatory Visit: Payer: Medicare Other

## 2012-12-04 DIAGNOSIS — E785 Hyperlipidemia, unspecified: Secondary | ICD-10-CM

## 2012-12-04 DIAGNOSIS — S0190XD Unspecified open wound of unspecified part of head, subsequent encounter: Secondary | ICD-10-CM

## 2012-12-05 LAB — COMPREHENSIVE METABOLIC PANEL
ALT: 17 IU/L (ref 0–32)
AST: 19 IU/L (ref 0–40)
Albumin/Globulin Ratio: 1.8 (ref 1.1–2.5)
Albumin: 4.4 g/dL (ref 3.5–4.7)
Alkaline Phosphatase: 61 IU/L (ref 39–117)
BUN/Creatinine Ratio: 12 (ref 11–26)
BUN: 9 mg/dL (ref 8–27)
CO2: 23 mmol/L (ref 18–29)
Calcium: 9.7 mg/dL (ref 8.6–10.2)
Chloride: 104 mmol/L (ref 97–108)
Creatinine, Ser: 0.78 mg/dL (ref 0.57–1.00)
GFR calc Af Amer: 82 mL/min/{1.73_m2} (ref >59)
GFR calc non Af Amer: 71 mL/min/{1.73_m2} (ref >59)
Globulin, Total: 2.4 g/dL (ref 1.5–4.5)
Glucose: 105 mg/dL — ABNORMAL HIGH (ref 65–99)
Potassium: 4.2 mmol/L (ref 3.5–5.2)
Sodium: 142 mmol/L (ref 134–144)
Total Bilirubin: 0.3 mg/dL (ref 0.0–1.2)
Total Protein: 6.8 g/dL (ref 6.0–8.5)

## 2012-12-05 LAB — CBC WITH DIFFERENTIAL/PLATELET
Basophils Absolute: 0 10*3/uL (ref 0.0–0.2)
Basos: 1 % (ref 0–3)
Eos: 3 % (ref 0–5)
Eosinophils Absolute: 0.1 10*3/uL (ref 0.0–0.4)
HCT: 41 % (ref 34.0–46.6)
Hemoglobin: 14.2 g/dL (ref 11.1–15.9)
Immature Grans (Abs): 0 10*3/uL (ref 0.0–0.1)
Immature Granulocytes: 0 % (ref 0–2)
Lymphocytes Absolute: 1.5 10*3/uL (ref 0.7–3.1)
Lymphs: 39 % (ref 14–46)
MCH: 32 pg (ref 26.6–33.0)
MCHC: 34.6 g/dL (ref 31.5–35.7)
MCV: 92 fL (ref 79–97)
Monocytes Absolute: 0.4 10*3/uL (ref 0.1–0.9)
Monocytes: 10 % (ref 4–12)
Neutrophils Absolute: 1.8 10*3/uL (ref 1.4–7.0)
Neutrophils Relative %: 47 % (ref 40–74)
RBC: 4.44 x10E6/uL (ref 3.77–5.28)
RDW: 12.9 % (ref 12.3–15.4)
WBC: 3.9 10*3/uL (ref 3.4–10.8)

## 2012-12-05 LAB — LIPID PANEL
Chol/HDL Ratio: 2.4 ratio units (ref 0.0–4.4)
Cholesterol, Total: 192 mg/dL (ref 100–199)
HDL: 80 mg/dL (ref >39)
LDL Calculated: 98 mg/dL (ref 0–99)
Triglycerides: 70 mg/dL (ref 0–149)
VLDL Cholesterol Cal: 14 mg/dL (ref 5–40)

## 2012-12-06 ENCOUNTER — Encounter: Payer: Self-pay | Admitting: Internal Medicine

## 2012-12-06 ENCOUNTER — Ambulatory Visit (INDEPENDENT_AMBULATORY_CARE_PROVIDER_SITE_OTHER): Payer: Medicare Other | Admitting: Internal Medicine

## 2012-12-06 VITALS — BP 152/90 | HR 76 | Temp 97.8°F | Resp 14 | Ht 63.5 in | Wt 157.8 lb

## 2012-12-06 DIAGNOSIS — R269 Unspecified abnormalities of gait and mobility: Secondary | ICD-10-CM

## 2012-12-06 DIAGNOSIS — E559 Vitamin D deficiency, unspecified: Secondary | ICD-10-CM

## 2012-12-06 DIAGNOSIS — N644 Mastodynia: Secondary | ICD-10-CM

## 2012-12-06 DIAGNOSIS — N6310 Unspecified lump in the right breast, unspecified quadrant: Secondary | ICD-10-CM | POA: Insufficient documentation

## 2012-12-06 DIAGNOSIS — E785 Hyperlipidemia, unspecified: Secondary | ICD-10-CM | POA: Insufficient documentation

## 2012-12-06 DIAGNOSIS — I10 Essential (primary) hypertension: Secondary | ICD-10-CM | POA: Insufficient documentation

## 2012-12-06 DIAGNOSIS — R413 Other amnesia: Secondary | ICD-10-CM

## 2012-12-06 DIAGNOSIS — G3184 Mild cognitive impairment, so stated: Secondary | ICD-10-CM

## 2012-12-06 NOTE — Progress Notes (Signed)
Patient ID: Beth Foster, female   DOB: 1929/03/13, 79 y.o.   MRN: [id number] Location:  [facility] [place] [facility]  Code Status: full code;  Granddaughter is her Print production planner of her living will   Allergies  Allergen Reactions  . Amoxicillin   . Cephalexin   . Clindamycin/Lincomycin   . Other     Beta blocker, ARBS, steroids  . Reglan (Metoclopramide)     Chief Complaint  Patient presents with  . Medical Managment of Chronic Issues    HPI: Patient is a 79 y.o. white female seen in the office today for med mgt of chronic diseases.  Staggering a lot.  Coughing a lot.  Mammogram turned out normal.  Had had breast swelling and pain which have gone away.  No further falls.  Holds onto things.  Is more careful than before.  BP at home 135/71.  High here, but she is nervous.  Had one day of dizziness where she went to the ED, but nothing was found.  CT was unremarkable when her left eye was swollen.  Second granddaughter is here now.  She is keeping her company and she's watching her children and a bird.  Is using walker when they go out.  Also has raised toilet seat which helps.  Bowels are moving well.  Has some concerns she will be incontinent of urine at night--uses depends in case and watches her fluids before bed.    Review of Systems:  Review of Systems  Constitutional: Negative for fever, weight loss and malaise/fatigue.  HENT: Negative for congestion.        Head remains tender where she fell and hit her head a few months ago  Eyes: Negative for blurred vision.  Respiratory: Positive for cough. Negative for sputum production, shortness of breath and wheezing.   Cardiovascular: Positive for chest pain. Negative for palpitations, orthopnea, leg swelling and PND.  Gastrointestinal: Negative for heartburn, abdominal pain and constipation.  Genitourinary: Positive for urgency. Negative for dysuria and frequency.  Musculoskeletal: Negative for back pain,  joint pain and falls.       Slow when first stands up, but improves as she walks  Skin: Negative for rash.  Neurological: Positive for dizziness. Negative for focal weakness.  Endo/Heme/Allergies: Positive for environmental allergies.       Atopic dermatitis  Psychiatric/Behavioral: Positive for memory loss. Negative for depression.     Past Medical History  Diagnosis Date  . GERD (gastroesophageal reflux disease)   . Hypertension   . Pain in joint, pelvic region and thigh   . Vitamin D deficiency   . Viral hepatitis A without mention of hepatic coma   . Malignant neoplasm of breast (female), unspecified site   . Anxiety state, unspecified   . Unspecified cataract   . Unspecified late effects of cerebrovascular disease   . External hemorrhoids without mention of complication   . Contact dermatitis and other eczema due to other chemical products   . Bunion   . Rash and other nonspecific skin eruption   . Edema   . Viral hepatitis A without mention of hepatic coma     Past Surgical History  Procedure Laterality Date  . Cholecystectomy  1972  . Breast lumpectomy  1995  . Cardiac catheterization  2004    Social History:   reports that she has never smoked. She does not have any smokeless tobacco history on file. She reports that she does not drink alcohol  or use illicit drugs.  Family History  Problem Relation Age of Onset  . Cancer Mother     lung  . Cancer Brother     brain    Medications: Patient's Medications  New Prescriptions   No medications on file  Previous Medications   CHOLECALCIFEROL (VITAMIN D) 1000 UNITS TABLET    Take 1,000 Units by mouth daily.    DILTIAZEM (DILACOR XR) 120 MG 24 HR CAPSULE       MULTIPLE VITAMIN (MULTIVITAMIN) TABLET    Take 1 tablet by mouth daily.   Modified Medications   No medications on file  Discontinued Medications   No medications on file   Physical Exam: Filed Vitals:   12/06/12 1345  BP: 152/90  Pulse: 76  Temp:  97.8 F (36.6 C)  TempSrc: Oral  Resp: 14  Height: 5' 3.5" (1.613 m)  Weight: 157 lb 12.8 oz (71.578 kg)  Physical Exam  Constitutional: She is oriented to person, place, and time. She appears well-developed and well-nourished. No distress.  HENT:  Head: Normocephalic and atraumatic.  Cardiovascular: Normal rate, regular rhythm, normal heart sounds and intact distal pulses.   Pulmonary/Chest: Effort normal and breath sounds normal.  Abdominal: Soft. Bowel sounds are normal.  Musculoskeletal: Normal range of motion.  Must use arms to stand from chair  Neurological: She is alert and oriented to person, place, and time.  Skin: Skin is warm and dry.  Pale, dry skin    Labs reviewed: Basic Metabolic Panel:  Recent Labs  95/28/41 0810 10/09/12 1432 12/04/12 0805  NA 142 139 142  K 5.5* 4.4 4.2  CL 103 104 104  CO2 25 22 23   GLUCOSE 110* 200* 105*  BUN 13 9 9   CREATININE 0.76 0.61 0.78  CALCIUM 9.9 9.7 9.7   Liver Function Tests:  Recent Labs  12/04/12 0805  AST 19  ALT 17  ALKPHOS 61  BILITOT 0.3  PROT 6.8  CBC:  Recent Labs  08/13/12 1647 10/09/12 1432 12/04/12 0805  WBC 8.9 5.1 3.9  NEUTROABS 6.2 2.7 1.8  HGB 13.5 14.1 14.2  HCT 40.0 41.3 41.0  MCV 93 94.7 92  PLT  --  211  --    Lipid Panel:  Recent Labs  12/04/12 0805  HDL 80  LDLCALC 98  TRIG 70  CHOLHDL 2.4   Assessment/Plan Gait disorder No further falls, but has had near falls due to sensation she will fall backwards.  Is using walker outside of her home and furniture surfing in the home.  Advised to use the walker more, but she's not been within the house.    Mild cognitive impairment with memory loss Has been stable lately.  No safety concerns.  Will need to retest memory soon.  Unspecified vitamin D deficiency Continue vitamin d supplement indefinitely.  She gets no sun exposure, but does eat a well-balanced diet.  Other and unspecified hyperlipidemia Last lipids at goal with  excellent HDL.  Not on medication.  Painful lumpy right breast This has resolved.  She no longer has any symptoms and her mammogram returned normal.  Unclear what the cause of this was.  She does not recall any trauma.   Labs/tests ordered:  None ordered today Next appt:  4 mos.

## 2012-12-06 NOTE — Assessment & Plan Note (Signed)
Last lipids at goal with excellent HDL.  Not on medication.

## 2012-12-06 NOTE — Assessment & Plan Note (Signed)
Has been stable lately.  No safety concerns.  Will need to retest memory soon.

## 2012-12-06 NOTE — Assessment & Plan Note (Signed)
Continue vitamin d supplement indefinitely.  She gets no sun exposure, but does eat a well-balanced diet.

## 2012-12-06 NOTE — Assessment & Plan Note (Signed)
This has resolved.  She no longer has any symptoms and her mammogram returned normal.  Unclear what the cause of this was.  She does not recall any trauma.

## 2012-12-06 NOTE — Assessment & Plan Note (Signed)
No further falls, but has had near falls due to sensation she will fall backwards.  Is using walker outside of her home and furniture surfing in the home.  Advised to use the walker more, but she's not been within the house.

## 2013-01-09 ENCOUNTER — Other Ambulatory Visit: Payer: Self-pay | Admitting: Geriatric Medicine

## 2013-01-09 ENCOUNTER — Telehealth: Payer: Self-pay | Admitting: Geriatric Medicine

## 2013-01-09 MED ORDER — FUROSEMIDE 20 MG PO TABS: 20.0000 mg | ORAL_TABLET | Freq: Every day | ORAL | Status: DC

## 2013-01-09 NOTE — Telephone Encounter (Signed)
Patient was here in July to see you. Greggory Stallion, her son in law called asking for her furosemide to be refilled. It is no longer on her med list. He says it is supposed to be as needed only and she has some swelling and they are going out of town. Would you authorize a new Rx for furosemide? Please advise, thanks.

## 2013-01-10 NOTE — Telephone Encounter (Signed)
Ok to resume lasix.

## 2013-01-23 ENCOUNTER — Emergency Department (HOSPITAL_COMMUNITY)
Admission: EM | Admit: 2013-01-23 | Discharge: 2013-01-24 | Disposition: A | Discharge status: Home / Self Care | Payer: Medicare Other | Attending: Emergency Medicine | Admitting: Emergency Medicine

## 2013-01-23 ENCOUNTER — Encounter (HOSPITAL_COMMUNITY): Payer: Self-pay | Admitting: *Deleted

## 2013-01-23 DIAGNOSIS — Y9289 Other specified places as the place of occurrence of the external cause: Secondary | ICD-10-CM | POA: Insufficient documentation

## 2013-01-23 DIAGNOSIS — Z8719 Personal history of other diseases of the digestive system: Secondary | ICD-10-CM | POA: Insufficient documentation

## 2013-01-23 DIAGNOSIS — Z8619 Personal history of other infectious and parasitic diseases: Secondary | ICD-10-CM | POA: Insufficient documentation

## 2013-01-23 DIAGNOSIS — Z8679 Personal history of other diseases of the circulatory system: Secondary | ICD-10-CM | POA: Insufficient documentation

## 2013-01-23 DIAGNOSIS — Z8669 Personal history of other diseases of the nervous system and sense organs: Secondary | ICD-10-CM | POA: Insufficient documentation

## 2013-01-23 DIAGNOSIS — Z853 Personal history of malignant neoplasm of breast: Secondary | ICD-10-CM | POA: Insufficient documentation

## 2013-01-23 DIAGNOSIS — Z880 Allergy status to penicillin: Secondary | ICD-10-CM | POA: Insufficient documentation

## 2013-01-23 DIAGNOSIS — Z8659 Personal history of other mental and behavioral disorders: Secondary | ICD-10-CM | POA: Insufficient documentation

## 2013-01-23 DIAGNOSIS — W010XXA Fall on same level from slipping, tripping and stumbling without subsequent striking against object, initial encounter: Secondary | ICD-10-CM | POA: Insufficient documentation

## 2013-01-23 DIAGNOSIS — Z872 Personal history of diseases of the skin and subcutaneous tissue: Secondary | ICD-10-CM | POA: Insufficient documentation

## 2013-01-23 DIAGNOSIS — Z8739 Personal history of other diseases of the musculoskeletal system and connective tissue: Secondary | ICD-10-CM | POA: Insufficient documentation

## 2013-01-23 DIAGNOSIS — Z91040 Latex allergy status: Secondary | ICD-10-CM | POA: Insufficient documentation

## 2013-01-23 DIAGNOSIS — Z8673 Personal history of transient ischemic attack (TIA), and cerebral infarction without residual deficits: Secondary | ICD-10-CM | POA: Insufficient documentation

## 2013-01-23 DIAGNOSIS — S300XXA Contusion of lower back and pelvis, initial encounter: Secondary | ICD-10-CM | POA: Insufficient documentation

## 2013-01-23 DIAGNOSIS — W19XXXA Unspecified fall, initial encounter: Secondary | ICD-10-CM

## 2013-01-23 DIAGNOSIS — I10 Essential (primary) hypertension: Secondary | ICD-10-CM | POA: Insufficient documentation

## 2013-01-23 DIAGNOSIS — Z8639 Personal history of other endocrine, nutritional and metabolic disease: Secondary | ICD-10-CM | POA: Insufficient documentation

## 2013-01-23 DIAGNOSIS — Z79899 Other long term (current) drug therapy: Secondary | ICD-10-CM | POA: Insufficient documentation

## 2013-01-23 DIAGNOSIS — Y9301 Activity, walking, marching and hiking: Secondary | ICD-10-CM | POA: Insufficient documentation

## 2013-01-23 DIAGNOSIS — T148XXA Other injury of unspecified body region, initial encounter: Secondary | ICD-10-CM

## 2013-01-23 LAB — BASIC METABOLIC PANEL
BUN: 9 mg/dL (ref 6–23)
CO2: 24 mEq/L (ref 19–32)
Calcium: 9.7 mg/dL (ref 8.4–10.5)
Chloride: 108 mEq/L (ref 96–112)
Creatinine, Ser: 0.53 mg/dL (ref 0.50–1.10)
GFR calc Af Amer: >90 mL/min (ref >90)
GFR calc non Af Amer: 86 mL/min — ABNORMAL LOW (ref >90)
Glucose, Bld: 107 mg/dL — ABNORMAL HIGH (ref 70–99)
Potassium: 3.8 mEq/L (ref 3.5–5.1)
Sodium: 144 mEq/L (ref 135–145)

## 2013-01-23 LAB — CBC
HCT: 40.4 % (ref 36.0–46.0)
Hemoglobin: 13.9 g/dL (ref 12.0–15.0)
MCH: 32.6 pg (ref 26.0–34.0)
MCHC: 34.4 g/dL (ref 30.0–36.0)
MCV: 94.6 fL (ref 78.0–100.0)
Platelets: 199 10*3/uL (ref 150–400)
RBC: 4.27 MIL/uL (ref 3.87–5.11)
RDW: 13.2 % (ref 11.5–15.5)
WBC: 4.6 10*3/uL (ref 4.0–10.5)

## 2013-01-23 MED ORDER — ACETAMINOPHEN 325 MG PO TABS
650.0000 mg | ORAL_TABLET | Freq: Once | ORAL | Status: AC
  Administered 2013-01-24: 650 mg via ORAL
  Filled 2013-01-23: qty 2

## 2013-01-23 NOTE — ED Notes (Signed)
Pt is unable at this time to give an urine specimen. The pt was advised to use call light for assistance to the restroom. The tech has reported to the RN in charge.

## 2013-01-23 NOTE — ED Provider Notes (Signed)
TIME SEEN: 11:41 PM  CHIEF COMPLAINT: Fall  HPI: Patient is an 79 year old female with a history of hypertension and prior stroke who presents the emergency department for evaluation for a bruise to her buttocks after a fall yesterday. Patient reports that she was sliding a box across the floor and was walking backwards taking a box when she lost her balance and fell onto her bottom. She is not sure if she hit her head. She denies any loss of consciousness. She denies any chest pain, shortness of breath, palpitations, dizziness, new numbness or tingling or focal weakness that caused her to fall. She's been able to ambulate since does complain of pain into her buttock and lower back. She is not on any anticoagulation.  Denies any recent fever, cough, vomiting or diarrhea.   ROS: See HPI Constitutional: no fever  Eyes: no drainage  ENT: no runny nose   Cardiovascular:  no chest pain  Resp: no SOB  GI: no vomiting GU: no dysuria Integumentary: no rash  Allergy: no hives  Musculoskeletal: no leg swelling  Neurological: no slurred speech ROS otherwise negative  PAST MEDICAL HISTORY/PAST SURGICAL HISTORY:  Past Medical History  Diagnosis Date  . GERD (gastroesophageal reflux disease)   . Hypertension   . Pain in joint, pelvic region and thigh   . Vitamin D deficiency   . Viral hepatitis A without mention of hepatic coma   . Malignant neoplasm of breast (female), unspecified site   . Anxiety state, unspecified   . Unspecified cataract   . Unspecified late effects of cerebrovascular disease   . External hemorrhoids without mention of complication   . Contact dermatitis and other eczema due to other chemical products   . Bunion   . Rash and other nonspecific skin eruption   . Edema   . Viral hepatitis A without mention of hepatic coma     MEDICATIONS:  Prior to Admission medications   Medication Sig Start Date End Date Taking? Authorizing Provider  cholecalciferol (VITAMIN D) 1000  UNITS tablet Take 1,000 Units by mouth daily.    Yes Historical Provider, MD  diltiazem (DILACOR XR) 120 MG 24 hr capsule Take 120 mg by mouth daily.  08/17/12  Yes Historical Provider, MD  furosemide (LASIX) 20 MG tablet Take 20 mg by mouth daily as needed for edema.   Yes Historical Provider, MD  Multiple Vitamin (MULTIVITAMIN) tablet Take 1 tablet by mouth daily.    Yes Historical Provider, MD    ALLERGIES:  Allergies  Allergen Reactions  . Amoxicillin   . Angiotensin Receptor Blockers   . Beta Adrenergic Blockers   . Cephalexin   . Clindamycin/Lincomycin   . Other      steroids  . Reglan [Metoclopramide]   . Latex Rash    SOCIAL HISTORY:  History  Substance Use Topics  . Smoking status: Never Smoker   . Smokeless tobacco: Not on file  . Alcohol Use: No    FAMILY HISTORY: Family History  Problem Relation Age of Onset  . Cancer Mother     lung  . Cancer Brother     brain    EXAM: BP 173/87  Pulse 79  Temp(Src) 97.4 F (36.3 C) (Oral)  Resp 18  SpO2 98% CONSTITUTIONAL: Alert and oriented and responds appropriately to questions. Well-appearing; well-nourished; GCS 15 HEAD: Normocephalic; atraumatic EYES: Conjunctivae clear, PERRL, EOMI ENT: normal nose; no rhinorrhea; moist mucous membranes; pharynx without lesions noted; no dental injury; no hemotypanum; no septal hematoma  NECK: Supple, no meningismus, no LAD; no midline spinal tenderness, step-off or deformity CARD: RRR; S1 and S2 appreciated; no murmurs, no clicks, no rubs, no gallops RESP: Normal chest excursion without splinting or tachypnea; breath sounds clear and equal bilaterally; no wheezes, no rhonchi, no rales; chest wall stable, nontender to palpation ABD/GI: Normal bowel sounds; non-distended; soft, non-tender, no rebound, no guarding PELVIS:  stable, nontender to palpation BACK:  She does have a large area of ecchymosis tooth her sacral region with tenderness to palpation over the sacral region and  lower lumbar spine with no step-off or deformity, there is no CVA tenderness; otherwise there is no midline spinal tenderness, step-off or deformity EXT: Mild tenderness to palpation over the left shoulder with normal range of motion, 2+ radial pulses bilaterally, otherwise Normal ROM in all joints; non-tender to palpation; no edema; normal capillary refill; no cyanosis    SKIN: Normal color for age and race; warm NEURO: Moves all extremities equally, normal gait, strength 5/5 in all 4 extremities, sensation to light touch intact diffusely, cranial nerves II through XII intact, no pronator dressed PSYCH: The patient's mood and manner are appropriate. Grooming and personal hygiene are appropriate.  MEDICAL DECISION MAKING: Patient with mechanical fall who presents with lower back pain and sacral pain. We'll obtain x-rays of her lower back, pelvis, left shoulder and a CT of her head and cervical spine. EKG shows no ischemic changes. Labs are completely unremarkable. Urinalysis pending. Will give Tylenol for pain control.   Date: 01/23/2013 21:26  Rate: 77  Rhythm: normal sinus rhythm  QRS Axis: normal  Intervals: normal  ST/T Wave abnormalities: normal  Conduction Disutrbances: none  Narrative Interpretation: unremarkable; no ischemic changes, no old for comparison      ED PROGRESS:  Patient's urine shows no sign of infection. Her imaging is all unremarkable. We'll discharge home with return precautions, ECP followup. Patient reports she can take Tylenol at home for pain.  Layla Maw Maleki Hippe, DO 01/24/13 [phone]

## 2013-01-23 NOTE — ED Notes (Signed)
Pt reports that she bent over last night to reach into a box last night and fell backwards after standing back up.  States that she has been 'stumbling' a lot lately.  Pt reports large bruise on buttock.  Denies hitting head.  Denies LOC.

## 2013-01-24 ENCOUNTER — Emergency Department (HOSPITAL_COMMUNITY): Payer: Medicare Other

## 2013-01-24 LAB — URINE MICROSCOPIC-ADD ON

## 2013-01-24 LAB — URINALYSIS, ROUTINE W REFLEX MICROSCOPIC
Bilirubin Urine: NEGATIVE
Glucose, UA: NEGATIVE mg/dL
Hgb urine dipstick: NEGATIVE
Ketones, ur: NEGATIVE mg/dL
Nitrite: NEGATIVE
Protein, ur: NEGATIVE mg/dL
Specific Gravity, Urine: 1.007 (ref 1.005–1.030)
Urobilinogen, UA: 0.2 mg/dL (ref 0.0–1.0)
pH: 7 (ref 5.0–8.0)

## 2013-01-25 LAB — URINE CULTURE
Colony Count: NO GROWTH
Culture: NO GROWTH

## 2013-02-08 ENCOUNTER — Encounter: Payer: Self-pay | Admitting: Internal Medicine

## 2013-02-08 ENCOUNTER — Ambulatory Visit (INDEPENDENT_AMBULATORY_CARE_PROVIDER_SITE_OTHER): Payer: Medicare Other | Admitting: Internal Medicine

## 2013-02-08 ENCOUNTER — Telehealth: Payer: Self-pay

## 2013-02-08 ENCOUNTER — Ambulatory Visit
Admission: RE | Admit: 2013-02-08 | Discharge: 2013-02-08 | Disposition: A | Discharge status: Home / Self Care | Payer: Medicare Other | Source: Ambulatory Visit | Attending: Internal Medicine | Admitting: Internal Medicine

## 2013-02-08 VITALS — BP 142/80 | HR 101 | Temp 97.8°F | Resp 18 | Ht 63.0 in | Wt 155.0 lb

## 2013-02-08 DIAGNOSIS — M7989 Other specified soft tissue disorders: Secondary | ICD-10-CM

## 2013-02-08 DIAGNOSIS — I83892 Varicose veins of left lower extremities with other complications: Secondary | ICD-10-CM

## 2013-02-08 DIAGNOSIS — W19XXXD Unspecified fall, subsequent encounter: Secondary | ICD-10-CM

## 2013-02-08 DIAGNOSIS — W19XXXA Unspecified fall, initial encounter: Secondary | ICD-10-CM

## 2013-02-08 DIAGNOSIS — I83893 Varicose veins of bilateral lower extremities with other complications: Secondary | ICD-10-CM

## 2013-02-08 NOTE — Telephone Encounter (Signed)
Venous ultrasound revealed superficial thrombophlebitis of her left leg--this means there are small blood clots in her varicose veins near the surface of her skin. I recommend she wear compression hose at least 30-55mmHg pressure on the left leg while up and about in the daytime. At night, elevate the foot of the bed slightly. For comfort, the best thing is warm compresses and short term, she may use aleve 1-2 tablets per day (ideally not for more than 1-2 weeks because it may affect her kidneys or stomach). Her leg may take 4-6 weeks to get better. She should return here if she is not getting any improvement or go to the ER if she develops new onset shortness of breath.   Dr.Reeds Response

## 2013-02-08 NOTE — Telephone Encounter (Signed)
Call Report from radiology:  IMPRESSION:  No evidence of left lower extremity DVT. The study is positive for  superficial thrombophlebitis within the great saphenous vein of the  Thigh. ** Patient to leave and we will further follow-up with patient once advised by physician   I called Dr.Reed to inform her of report, per Dr.Reed she will send additional recommendations/instructions via Epic to the clinical pool shortly

## 2013-02-08 NOTE — Progress Notes (Addendum)
Patient ID: Beth Foster, female   DOB: 06-07-1928, 79 y.o.   MRN: [id number] Venous ultrasound revealed superficial thrombophlebitis of her left leg--this means there are small blood clots in her varicose veins near the surface of her skin.  I recommend she wear compression hose at least 30-59mmHg pressure on the left leg while up and about in the daytime.  At night, elevate the foot of the bed slightly.  For comfort, the best thing is warm compresses and short term, she may use aleve 1-2 tablets per day (ideally not for more than 1-2 weeks because it may affect her kidneys or stomach).   Her leg may take 4-6 weeks to get better.  She should return here if she is not getting any improvement or go to the ER if she develops new onset shortness of breath.

## 2013-02-08 NOTE — Progress Notes (Signed)
Patient ID: Beth Foster, female   DOB: 05/24/1928, 79 y.o.   MRN: [id number] Location:  [facility] / [facility]  Code Status: DNR   Allergies  Allergen Reactions  . Angiotensin Receptor Blockers Itching, Swelling and Rash    Tongue swelling  . Beta Adrenergic Blockers Itching, Swelling and Rash    Tongue swelling   . Cephalexin Swelling    Tongue swelling  . Clindamycin/Lincomycin Itching, Swelling and Rash    Tongue swelling  . Amoxicillin Other (See Comments)    unknown  . Latex Rash  . Other Itching and Rash     Steroids cause head to toe rash and itching  . Reglan [Metoclopramide] Itching and Rash    Chief Complaint  Patient presents with  . Acute Visit    Left leg swollen from thigh to calf, sweeling increase during the day.    HPI: Patient is a 79 y.o. white female seen in the office today for acute visit.  She had a fall and went to the ED again--she still has a large baseball sized bruise on her sacrum from this which remains tender and some bruising on her left upper arm.  She was putting something out to trash and toppled over.    She is here due to left leg swelling that worsens through the day--present for the past few days, had erythema that had started in the calf and moved up to her groin area.  Now it is present at her medial knee and distal thigh area.  She has exquisite tenderness of her medial calf and a couple of hard areas present superficially at varicose vein sites.    Review of Systems:  Review of Systems  Constitutional: Negative for fever, chills and malaise/fatigue.  HENT: Negative for congestion.   Eyes: Negative for blurred vision.  Respiratory: Positive for shortness of breath.        Unchanged from her baseline though  Cardiovascular: Positive for leg swelling. Negative for chest pain.  Gastrointestinal: Negative for constipation.  Genitourinary: Negative for dysuria.  Musculoskeletal: Positive for falls.  Skin:  Negative for rash.  Neurological: Positive for dizziness. Negative for weakness.  Endo/Heme/Allergies: Bruises/bleeds easily.  Psychiatric/Behavioral: Positive for memory loss.     Past Medical History  Diagnosis Date  . GERD (gastroesophageal reflux disease)   . Hypertension   . Pain in joint, pelvic region and thigh   . Vitamin D deficiency   . Viral hepatitis A without mention of hepatic coma   . Malignant neoplasm of breast (female), unspecified site   . Anxiety state, unspecified   . Unspecified cataract   . Unspecified late effects of cerebrovascular disease   . External hemorrhoids without mention of complication   . Contact dermatitis and other eczema due to other chemical products   . Bunion   . Rash and other nonspecific skin eruption   . Edema   . Viral hepatitis A without mention of hepatic coma     Past Surgical History  Procedure Laterality Date  . Cholecystectomy  1972  . Breast lumpectomy  1995  . Cardiac catheterization  2004    Social History:   reports that she has never smoked. She does not have any smokeless tobacco history on file. She reports that she does not drink alcohol or use illicit drugs.  Family History  Problem Relation Age of Onset  . Cancer Mother     lung  . Cancer Brother  brain    Medications: Patient's Medications  New Prescriptions   No medications on file  Previous Medications   CHOLECALCIFEROL (VITAMIN D) 1000 UNITS TABLET    Take 1,000 Units by mouth daily.    DILTIAZEM (DILACOR XR) 120 MG 24 HR CAPSULE    Take 120 mg by mouth daily.    FUROSEMIDE (LASIX) 20 MG TABLET    Take 20 mg by mouth daily as needed for edema.   MULTIPLE VITAMIN (MULTIVITAMIN) TABLET    Take 1 tablet by mouth daily.   Modified Medications   No medications on file  Discontinued Medications   No medications on file     Physical Exam: Filed Vitals:   02/08/13 0833  BP: 142/80  Pulse: 101  Temp: 97.8 F (36.6 C)  TempSrc: Oral  Resp:  18  Height: 5\' 3"  (1.6 m)  Weight: 155 lb (70.308 kg)  SpO2: 95%  Physical Exam  Constitutional: She appears well-developed and well-nourished. No distress.  HENT:  Head: Normocephalic and atraumatic.  Cardiovascular: Exam reveals no gallop and no friction rub.   No murmur heard. Tachycardic, regular  Pulmonary/Chest: Effort normal and breath sounds normal. No respiratory distress.  Abdominal: Soft. Bowel sounds are normal. She exhibits no distension. There is no tenderness.  Musculoskeletal: Normal range of motion.  Neurological: She is alert.  Skin: Skin is warm and dry.  Large baseball sized bruise on sacrum, erythema of left medial distal thigh and medial knee area, tenderness of left medial calf over varicose veins, + homan's  Psychiatric: She has a normal mood and affect.     Labs reviewed: Basic Metabolic Panel:  Recent Labs  16/10/96 1432 12/04/12 0805 01/23/13 2129  NA 139 142 144  K 4.4 4.2 3.8  CL 104 104 108  CO2 22 23 24   GLUCOSE 200* 105* 107*  BUN 9 9 9   CREATININE 0.61 0.78 0.53  CALCIUM 9.7 9.7 9.7   Liver Function Tests:  Recent Labs  12/04/12 0805  AST 19  ALT 17  ALKPHOS 61  BILITOT 0.3  PROT 6.8   CBC:  Recent Labs  08/13/12 1647 10/09/12 1432 12/04/12 0805 01/23/13 2129  WBC 8.9 5.1 3.9 4.6  NEUTROABS 6.2 2.7 1.8  --   HGB 13.5 14.1 14.2 13.9  HCT 40.0 41.3 41.0 40.4  MCV 93 94.7 92 94.6  PLT  --  211  --  199   Lipid Panel:  Recent Labs  12/04/12 0805  HDL 80  LDLCALC 98  TRIG 70  CHOLHDL 2.4  Assessment/Plan 1. Left leg swelling - differential includes DVT, superficial phlebitis and cellulitis -is tachycardic, but this is not new -no open areas as source for cellulitis (at least not visible) -had recent trauma so puts her at risk for dvt - US Venous Img Lower Unilateral Left; Future with call report -had previous hemorrhagic stroke 3.5 years ago and says she was told never to take even baby asa, but her  granddaughter believes this was temporary--we never got these records  2. Varicose veins of left leg with edema -seems more prominent and are tender at this time - US Venous Img Lower Unilateral Left; Future  3. Fall at home, subsequent encounter -with sacral bruise and left upper extremity bruise--no fractures or head trauma  Labs/tests ordered:  Venous ultrasound left leg to r/o DVT and phlebitis Next appt:  As scheduled

## 2013-02-08 NOTE — Telephone Encounter (Signed)
Patient was informed that Dr. Renato Gails recommended that she wear compression hose at least 30-40 mmHg pressure on the left leg while she is up moving around during the daytime. She was informed to elevate the foot at night while sleeping by using pillows. She states that she would look for her compression hose and use aleve for pain and warm compresses for pain. Patient also states that she understands the recommendations and would call or go to the ER if no improvement. SR RMA

## 2013-03-14 ENCOUNTER — Ambulatory Visit (INDEPENDENT_AMBULATORY_CARE_PROVIDER_SITE_OTHER): Payer: Medicare Other | Admitting: Internal Medicine

## 2013-03-14 ENCOUNTER — Encounter: Payer: Self-pay | Admitting: Internal Medicine

## 2013-03-14 VITALS — BP 140/78 | HR 77 | Wt 152.0 lb

## 2013-03-14 DIAGNOSIS — Z23 Encounter for immunization: Secondary | ICD-10-CM

## 2013-03-14 DIAGNOSIS — G3184 Mild cognitive impairment, so stated: Secondary | ICD-10-CM

## 2013-03-14 DIAGNOSIS — I83892 Varicose veins of left lower extremities with other complications: Secondary | ICD-10-CM

## 2013-03-14 DIAGNOSIS — I83893 Varicose veins of bilateral lower extremities with other complications: Secondary | ICD-10-CM

## 2013-03-14 DIAGNOSIS — K219 Gastro-esophageal reflux disease without esophagitis: Secondary | ICD-10-CM

## 2013-03-14 DIAGNOSIS — R269 Unspecified abnormalities of gait and mobility: Secondary | ICD-10-CM

## 2013-03-14 DIAGNOSIS — R413 Other amnesia: Secondary | ICD-10-CM

## 2013-03-14 DIAGNOSIS — I10 Essential (primary) hypertension: Secondary | ICD-10-CM

## 2013-03-14 DIAGNOSIS — E559 Vitamin D deficiency, unspecified: Secondary | ICD-10-CM

## 2013-03-14 NOTE — Progress Notes (Signed)
Patient ID: Beth Foster, female   DOB: 1929-03-11, 79 y.o.   MRN: [id number] Location:  [facility] / [facility]  Code Status: DNR   Allergies  Allergen Reactions  . Angiotensin Receptor Blockers Itching, Swelling and Rash    Tongue swelling  . Beta Adrenergic Blockers Itching, Swelling and Rash    Tongue swelling   . Cephalexin Swelling    Tongue swelling  . Clindamycin/Lincomycin Itching, Swelling and Rash    Tongue swelling  . Amoxicillin Other (See Comments)    unknown  . Latex Rash  . Other Itching and Rash     Steroids cause head to toe rash and itching  . Reglan [Metoclopramide] Itching and Rash    Chief Complaint  Patient presents with  . Medical Managment of Chronic Issues    3 month follow-up, follow-up on legs -left leg worse   . Immunizations    Discuss flu vaccine, patient with blisters in area of flu vaccine and sorness still from last year flu vaccine   . Tachycardia    x several weeks at night patient with rapid pulse . FYI , patient drinks 4 cups or more of coffee daily     HPI: Patient is a 79 y.o. white female seen in the office today for regular visit about chronic medical conditions.    Left leg still has tight knot place in medial left lower leg just beneath knee.  Still hurts a little bit and it changes color.  Swelling is almost entirely resolved.    Right thumbnail is coming up ever since manicure.  No s/s of infection.    Says she had several blisters two years in a row from the flu shot, but her granddaughter notes only two small areas of irritation last time.    Notices here heart rate is fast at night.  She drinks 6 cups of coffee per her granddaughter.    They are trying to get her to move to an apartment at an independent living.  Discussed [place].    Review of Systems:  Review of Systems  Constitutional: Negative for weight loss.  HENT: Negative for congestion and hearing loss.   Eyes: Negative for blurred  vision.  Respiratory: Negative for cough and shortness of breath.   Cardiovascular: Negative for chest pain and leg swelling.  Gastrointestinal: Positive for heartburn and abdominal pain. Negative for nausea, vomiting and constipation.       Hemorrhoidal bleeding and irritation off and on;  Mild right lower quadrant pain and flatus (ate chili last night)  Genitourinary: Negative for dysuria.  Musculoskeletal: Positive for back pain. Negative for falls.       No falls since last visit, occasional loss of balance  Skin: Positive for rash.  Neurological: Positive for weakness. Negative for dizziness and headaches.  Endo/Heme/Allergies: Bruises/bleeds easily.  Psychiatric/Behavioral: Positive for memory loss. Negative for depression. The patient is nervous/anxious.      Past Medical History  Diagnosis Date  . GERD (gastroesophageal reflux disease)   . Hypertension   . Pain in joint, pelvic region and thigh   . Vitamin D deficiency   . Viral hepatitis A without mention of hepatic coma   . Malignant neoplasm of breast (female), unspecified site   . Anxiety state, unspecified   . Unspecified cataract   . Unspecified late effects of cerebrovascular disease   . External hemorrhoids without mention of complication   . Contact dermatitis and other eczema due to other  chemical products   . Bunion   . Rash and other nonspecific skin eruption   . Edema   . Viral hepatitis A without mention of hepatic coma     Past Surgical History  Procedure Laterality Date  . Cholecystectomy  1972  . Breast lumpectomy  1995  . Cardiac catheterization  2004    Social History:   reports that she has never smoked. She does not have any smokeless tobacco history on file. She reports that she does not drink alcohol or use illicit drugs.  Family History  Problem Relation Age of Onset  . Cancer Mother     lung  . Cancer Brother     brain    Medications: Patient's Medications  New Prescriptions   No  medications on file  Previous Medications   CHOLECALCIFEROL (VITAMIN D) 1000 UNITS TABLET    Take 1,000 Units by mouth daily.    DILTIAZEM (DILACOR XR) 120 MG 24 HR CAPSULE    Take 120 mg by mouth daily.    FUROSEMIDE (LASIX) 20 MG TABLET    Take 20 mg by mouth daily as needed for edema.   MULTIPLE VITAMIN (MULTIVITAMIN) TABLET    Take 1 tablet by mouth daily.   Modified Medications   No medications on file  Discontinued Medications   No medications on file     Physical Exam: Filed Vitals:   03/14/13 1316  BP: 140/78  Pulse: 77  Weight: 152 lb (68.947 kg)  SpO2: 96%  Physical Exam  Constitutional: She is oriented to person, place, and time. She appears well-developed and well-nourished. No distress.  Eyes: EOM are normal. Pupils are equal, round, and reactive to light.  Cardiovascular: Normal rate, regular rhythm, normal heart sounds and intact distal pulses.   Small 1cm tender raised area over varicose vein on left medial leg;  Using 4-prong cane to ambulate  Pulmonary/Chest: Effort normal and breath sounds normal. No respiratory distress.  Abdominal: Soft. Bowel sounds are normal. She exhibits no distension. There is no tenderness.  Musculoskeletal: Normal range of motion. She exhibits no edema.  Neurological: She is alert and oriented to person, place, and time.  Skin: Skin is warm and dry. There is pallor.  Psychiatric: She has a normal mood and affect.  Is anxious     Labs reviewed: Basic Metabolic Panel:  Recent Labs  16/10/96 1432 12/04/12 0805 01/23/13 2129  NA 139 142 144  K 4.4 4.2 3.8  CL 104 104 108  CO2 22 23 24   GLUCOSE 200* 105* 107*  BUN 9 9 9   CREATININE 0.61 0.78 0.53  CALCIUM 9.7 9.7 9.7   Liver Function Tests:  Recent Labs  12/04/12 0805  AST 19  ALT 17  ALKPHOS 61  BILITOT 0.3  PROT 6.8  CBC:  Recent Labs  08/13/12 1647 10/09/12 1432 12/04/12 0805 01/23/13 2129  WBC 8.9 5.1 3.9 4.6  NEUTROABS 6.2 2.7 1.8  --   HGB 13.5 14.1  14.2 13.9  HCT 40.0 41.3 41.0 40.4  MCV 93 94.7 92 94.6  PLT  --  211  --  199   Lipid Panel:  Recent Labs  12/04/12 0805  HDL 80  LDLCALC 98  TRIG 70  CHOLHDL 2.4   Assessment/Plan 1. Varicose veins of left leg with edema -improved with use of compression hose--swelling has gotten much much better  2. Gait disorder -using 4 prong cane with benefit  3. Mild cognitive impairment with memory loss -recommend she  not drive and her granddaughter agrees  4. Essential hypertension, benign -bp at goal   5. Vitamin D deficiency -continue current supplementation  6. GERD (gastroesophageal reflux disease) -is to watch acidic foods, but drinks a lot of coffee which may contribute  Is now agreeable to flu shot--monitor for rash--advised to come in if this develops so we can document it.      Labs/tests ordered:  Will do at next visit Next appt:  3 mos

## 2013-03-14 NOTE — Patient Instructions (Signed)
Please come fasting to your next regular visit so we can do your labs.

## 2013-03-24 ENCOUNTER — Other Ambulatory Visit: Payer: Self-pay | Admitting: Internal Medicine

## 2013-06-10 ENCOUNTER — Encounter: Payer: Self-pay | Admitting: Internal Medicine

## 2013-06-10 ENCOUNTER — Ambulatory Visit (INDEPENDENT_AMBULATORY_CARE_PROVIDER_SITE_OTHER): Payer: Medicare Other | Admitting: Internal Medicine

## 2013-06-10 VITALS — BP 152/86 | HR 70 | Temp 96.8°F | Wt 150.6 lb

## 2013-06-10 DIAGNOSIS — E559 Vitamin D deficiency, unspecified: Secondary | ICD-10-CM

## 2013-06-10 DIAGNOSIS — I10 Essential (primary) hypertension: Secondary | ICD-10-CM

## 2013-06-10 DIAGNOSIS — I83893 Varicose veins of bilateral lower extremities with other complications: Secondary | ICD-10-CM

## 2013-06-10 DIAGNOSIS — R269 Unspecified abnormalities of gait and mobility: Secondary | ICD-10-CM

## 2013-06-10 DIAGNOSIS — L814 Other melanin hyperpigmentation: Secondary | ICD-10-CM

## 2013-06-10 DIAGNOSIS — R413 Other amnesia: Secondary | ICD-10-CM

## 2013-06-10 DIAGNOSIS — G3184 Mild cognitive impairment, so stated: Secondary | ICD-10-CM

## 2013-06-10 DIAGNOSIS — I83892 Varicose veins of left lower extremities with other complications: Secondary | ICD-10-CM

## 2013-06-10 DIAGNOSIS — K219 Gastro-esophageal reflux disease without esophagitis: Secondary | ICD-10-CM

## 2013-06-10 DIAGNOSIS — L819 Disorder of pigmentation, unspecified: Secondary | ICD-10-CM

## 2013-06-10 NOTE — Progress Notes (Signed)
Patient ID: Beth Foster, female   DOB: 07/23/1928, 80 y.o.   MRN: [id number]   Location:  [facility]   Allergies  Allergen Reactions  . Angiotensin Receptor Blockers Itching, Swelling and Rash    Tongue swelling  . Beta Adrenergic Blockers Itching, Swelling and Rash    Tongue swelling   . Cephalexin Swelling    Tongue swelling  . Clindamycin/Lincomycin Itching, Swelling and Rash    Tongue swelling  . Amoxicillin Other (See Comments)    unknown  . Latex Rash  . Other Itching and Rash     Steroids cause head to toe rash and itching  . Reglan [Metoclopramide] Itching and Rash    Chief Complaint  Patient presents with  . Medical Managment of Chronic Issues    3 month f/u, no recent labs  . Immunizations    declines vaccines  . other    calius/corn on the RT foot    HPI: Patient is a 80 y.o. white female seen in the office today for med mgt chronic diseases No falls.   Left leg has healed from the superficial phlebitis.   Does not always walk with her cane, but her two granddaughters keep an eye on her. Balance bad sometimes.   BP as well controlled as pt permits--feels she is allergic to everything. Doesn't want to use walker as recommended.   Stomach doing ok.  Review of Systems:  Review of Systems  Constitutional: Negative for fever, chills, weight loss and malaise/fatigue.  HENT: Negative for congestion.   Eyes: Negative for blurred vision.  Respiratory: Negative for shortness of breath.   Cardiovascular: Negative for chest pain and leg swelling.  Gastrointestinal: Negative for abdominal pain and constipation.  Genitourinary: Negative for dysuria, urgency and frequency.  Musculoskeletal: Negative for falls.  Skin: Negative for rash.  Neurological: Positive for dizziness. Negative for loss of consciousness, weakness and headaches.       On standing  Endo/Heme/Allergies: Bruises/bleeds easily.  Psychiatric/Behavioral:  Positive for memory loss.    Past Medical History  Diagnosis Date  . GERD (gastroesophageal reflux disease)   . Hypertension   . Pain in joint, pelvic region and thigh   . Vitamin D deficiency   . Viral hepatitis A without mention of hepatic coma   . Malignant neoplasm of breast (female), unspecified site   . Anxiety state, unspecified   . Unspecified cataract   . Unspecified late effects of cerebrovascular disease   . External hemorrhoids without mention of complication   . Contact dermatitis and other eczema due to other chemical products   . Bunion   . Rash and other nonspecific skin eruption   . Edema   . Viral hepatitis A without mention of hepatic coma     Past Surgical History  Procedure Laterality Date  . Cholecystectomy  1972  . Breast lumpectomy  1995  . Cardiac catheterization  2004    Social History:   reports that she has never smoked. She does not have any smokeless tobacco history on file. She reports that she does not drink alcohol or use illicit drugs.  Family History  Problem Relation Age of Onset  . Cancer Mother     lung  . Cancer Brother     brain    Medications: Patient's Medications  New Prescriptions   No medications on file  Previous Medications   CHOLECALCIFEROL (VITAMIN D) 1000 UNITS TABLET    Take 1,000 Units by  mouth daily.    DILTIAZEM (DILACOR XR) 120 MG 24 HR CAPSULE    TAKE ONE CAPSULE BY MOUTH ONCE DAILY FOR BLOOD PRESSURE   FUROSEMIDE (LASIX) 20 MG TABLET    Take 20 mg by mouth daily as needed for edema.   MULTIPLE VITAMIN (MULTIVITAMIN) TABLET    Take 1 tablet by mouth daily.   Modified Medications   No medications on file  Discontinued Medications   No medications on file     Physical Exam: Filed Vitals:   06/10/13 1324  BP: 152/86  Pulse: 70  Temp: 96.8 F (36 C)  TempSrc: Oral  Weight: 150 lb 9.6 oz (68.312 kg)  SpO2: 98%  Physical Exam  Constitutional: She is oriented to person, place, and time. She appears  well-developed and well-nourished. No distress.  Cardiovascular: Normal rate, regular rhythm, normal heart sounds and intact distal pulses.   Pulmonary/Chest: Effort normal and breath sounds normal.  Abdominal: Soft. Bowel sounds are normal. She exhibits no distension. There is no tenderness.  Musculoskeletal: Normal range of motion. She exhibits no edema and no tenderness.  Walks with cane, unsteady on standing  Neurological: She is alert and oriented to person, place, and time.  Skin: Skin is warm and dry.  Small brown papules over arms, varicose veins on legs left greater than right, callous on bunion  Psychiatric: She has a normal mood and affect.    Labs reviewed: Basic Metabolic Panel:  Recent Labs  10/09/12 1432 12/04/12 0805 01/23/13 2129  NA 139 142 144  K 4.4 4.2 3.8  CL 104 104 108  CO2 22 23 24   GLUCOSE 200* 105* 107*  BUN 9 9 9   CREATININE 0.61 0.78 0.53  CALCIUM 9.7 9.7 9.7   Liver Function Tests:  Recent Labs  12/04/12 0805  AST 19  ALT 17  ALKPHOS 61  BILITOT 0.3  PROT 6.8  CBC:  Recent Labs  08/13/12 1647 10/09/12 1432 12/04/12 0805 01/23/13 2129  WBC 8.9 5.1 3.9 4.6  NEUTROABS 6.2 2.7 1.8  --   HGB 13.5 14.1 14.2 13.9  HCT 40.0 41.3 41.0 40.4  MCV 93 94.7 92 94.6  PLT  --  211  --  199   Lipid Panel:  Recent Labs  12/04/12 0805  HDL 80  LDLCALC 98  TRIG 70  CHOLHDL 2.4    Assessment/Plan 1. Mild cognitive impairment with memory loss -seems pretty stable over past couple of years -high risk for delirium  -will recheck mmse next visit -tried to encourage independent living apt over her private home for safety, but she is adamantly opposed  2. Essential hypertension, benign -bp remains a little higher than I would prefer, but   3. Gait disorder -again recommended walker use out and about and consistent use of cane due to h/o falls  4. Vitamin D deficiency -f/u level to determine dose, goal is >40  5. Liver spots -noted  on skin of arms, none on legs to speak of -pt says these are spider bites and come in threes, but I do not see this and neither does her granddaughter  6. GERD (gastroesophageal reflux disease) -stable at present with current meds and avoiding excess coffee  7. Varicose veins of left leg with edema -superficial thrombophlebitis resolved  Labs/tests ordered:   Orders Placed This Encounter  Procedures  . Basic metabolic panel  . Vitamin D, 25-hydroxy   Next appt:  3 mos with mmse

## 2013-06-11 LAB — BASIC METABOLIC PANEL
BUN/Creatinine Ratio: 14 (ref 11–26)
BUN: 9 mg/dL (ref 8–27)
CO2: 22 mmol/L (ref 18–29)
Calcium: 9.6 mg/dL (ref 8.7–10.3)
Chloride: 102 mmol/L (ref 97–108)
Creatinine, Ser: 0.65 mg/dL (ref 0.57–1.00)
GFR calc Af Amer: 96 mL/min/{1.73_m2} (ref >59)
GFR calc non Af Amer: 83 mL/min/{1.73_m2} (ref >59)
Glucose: 97 mg/dL (ref 65–99)
Potassium: 4.2 mmol/L (ref 3.5–5.2)
Sodium: 142 mmol/L (ref 134–144)

## 2013-06-11 LAB — VITAMIN D 25 HYDROXY (VIT D DEFICIENCY, FRACTURES): Vit D, 25-Hydroxy: 27.8 ng/mL — ABNORMAL LOW (ref 30.0–100.0)

## 2013-08-15 ENCOUNTER — Ambulatory Visit: Payer: Self-pay | Admitting: Internal Medicine

## 2013-08-29 ENCOUNTER — Ambulatory Visit: Payer: Self-pay | Admitting: Internal Medicine

## 2013-08-29 DIAGNOSIS — Z0289 Encounter for other administrative examinations: Secondary | ICD-10-CM

## 2013-09-19 ENCOUNTER — Other Ambulatory Visit: Payer: Self-pay | Admitting: Internal Medicine

## 2013-10-28 ENCOUNTER — Ambulatory Visit (INDEPENDENT_AMBULATORY_CARE_PROVIDER_SITE_OTHER): Payer: Medicare Other | Admitting: Internal Medicine

## 2013-10-28 ENCOUNTER — Encounter: Payer: Self-pay | Admitting: Internal Medicine

## 2013-10-28 VITALS — BP 138/80 | HR 87 | Temp 97.6°F | Resp 10 | Wt 150.0 lb

## 2013-10-28 DIAGNOSIS — R0600 Dyspnea, unspecified: Secondary | ICD-10-CM

## 2013-10-28 DIAGNOSIS — R197 Diarrhea, unspecified: Secondary | ICD-10-CM

## 2013-10-28 DIAGNOSIS — I10 Essential (primary) hypertension: Secondary | ICD-10-CM

## 2013-10-28 DIAGNOSIS — E559 Vitamin D deficiency, unspecified: Secondary | ICD-10-CM

## 2013-10-28 DIAGNOSIS — R413 Other amnesia: Secondary | ICD-10-CM

## 2013-10-28 DIAGNOSIS — R0609 Other forms of dyspnea: Secondary | ICD-10-CM

## 2013-10-28 DIAGNOSIS — R0989 Other specified symptoms and signs involving the circulatory and respiratory systems: Secondary | ICD-10-CM

## 2013-10-28 DIAGNOSIS — R5381 Other malaise: Secondary | ICD-10-CM

## 2013-10-28 DIAGNOSIS — G3184 Mild cognitive impairment, so stated: Secondary | ICD-10-CM

## 2013-10-28 DIAGNOSIS — K649 Unspecified hemorrhoids: Secondary | ICD-10-CM

## 2013-10-28 DIAGNOSIS — R5383 Other fatigue: Secondary | ICD-10-CM

## 2013-10-28 DIAGNOSIS — R531 Weakness: Secondary | ICD-10-CM

## 2013-10-28 DIAGNOSIS — R269 Unspecified abnormalities of gait and mobility: Secondary | ICD-10-CM

## 2013-10-28 NOTE — Progress Notes (Signed)
Patient ID: Beth Foster, female   DOB: [birth year], 80 y.o.   MRN: [id number]   Location:  [facility] / [facility]  Code Status: DNR  Allergies  Allergen Reactions  . Angiotensin Receptor Blockers Itching, Swelling and Rash    Tongue swelling  . Beta Adrenergic Blockers Itching, Swelling and Rash    Tongue swelling   . Cephalexin Swelling    Tongue swelling  . Clindamycin/Lincomycin Itching, Swelling and Rash    Tongue swelling  . Amoxicillin Other (See Comments)    unknown  . Latex Rash  . Other Itching and Rash     Steroids cause head to toe rash and itching  . Reglan [Metoclopramide] Itching and Rash    Chief Complaint  Patient presents with  . Shortness of Breath    x 3 weeks patient with SOB with activity   . Weakness    x 3 weeks patient with increased weakness. Patient with diarrhea and hemorrhoid bleeding-? related to weakness   . Bloated    x 2 weeks patient c/o bloating in stomach area   . Hemorrhoids    Patient states this is almost resloved, not intrested in getting checked  . Headache    Headache only with taking Centrum Silver MVI- ? alternative     HPI: Patient is a 80 y.o. white female seen in the office today for several concerns.  She has had several of these in the past.  She is very anxious at times.  Is short of breath when talks too much or walks any distance.  Had pains in chest and around her heart for a minute, sat down and went away.  Has periods of hard coughing--nonproductive, has runny nose.  Eats and nose runs.    Feels bloated in her abdomen also.  Has lost only 1 lbs since January.   Diarrhea off and on for three weeks.  Is gone.  Got her hemorrhoids flared up--still bleeding a little.  Diarrhea stopped three days ago  Is coughing more.  Never smoked in her life.    Has not had EKG here in office.    Has not fallen.  Still has pain in the place on the back of her head from her fall march of last year.     Blood pressures have been better--120s-130s most of the time.  Is checking it at night.  Taking her bp med at bedtime.  Says she gets a headache from centrum silver mvi.    Review of Systems:  Review of Systems  Constitutional: Positive for malaise/fatigue. Negative for fever and chills.  HENT:       Rhinitis when eats  Eyes: Negative for blurred vision.  Respiratory: Positive for cough and shortness of breath. Negative for sputum production and wheezing.   Cardiovascular: Negative for chest pain and leg swelling.  Gastrointestinal: Positive for blood in stool. Negative for abdominal pain, constipation and melena.       Hemorrhoids  Genitourinary: Negative for dysuria.  Musculoskeletal: Negative for falls.  Skin:       Has atopic dermatitis  Neurological: Positive for weakness. Negative for dizziness, loss of consciousness and headaches.  Endo/Heme/Allergies: Bruises/bleeds easily.  Psychiatric/Behavioral: Positive for memory loss. The patient is nervous/anxious.      Past Medical History  Diagnosis Date  . GERD (gastroesophageal reflux disease)   . Hypertension   . Pain in joint, pelvic region and thigh   . Vitamin D deficiency   .  Viral hepatitis A without mention of hepatic coma   . Malignant neoplasm of breast (female), unspecified site   . Anxiety state, unspecified   . Unspecified cataract   . Unspecified late effects of cerebrovascular disease   . External hemorrhoids without mention of complication   . Contact dermatitis and other eczema due to other chemical products   . Bunion   . Rash and other nonspecific skin eruption   . Edema   . Viral hepatitis A without mention of hepatic coma     Past Surgical History  Procedure Laterality Date  . Cholecystectomy  1972  . Breast lumpectomy  1995  . Cardiac catheterization  2004    Social History:   reports that she has never smoked. She does not have any smokeless tobacco history on file. She reports that she  does not drink alcohol or use illicit drugs.  Family History  Problem Relation Age of Onset  . Cancer Mother     lung  . Cancer Brother     brain    Medications: Patient's Medications  New Prescriptions   No medications on file  Previous Medications   CHOLECALCIFEROL (VITAMIN D) 1000 UNITS TABLET    Take 1,000 Units by mouth daily.    FUROSEMIDE (LASIX) 20 MG TABLET    Take 20 mg by mouth daily as needed for edema.   MULTIPLE VITAMIN (MULTIVITAMIN) TABLET    Take 1 tablet by mouth daily.   Modified Medications   Modified Medication Previous Medication   DILTIAZEM (DILACOR XR) 120 MG 24 HR CAPSULE diltiazem (DILACOR XR) 120 MG 24 hr capsule      TAKE 1 CAPSULE BY MOUTH DAILY FOR BLOOD PRESSURE at night    TAKE 1 CAPSULE BY MOUTH DAILY FOR BLOOD PRESSURE  Discontinued Medications   No medications on file     Physical Exam: Filed Vitals:   10/28/13 1206  BP: 138/80  Pulse: 87  Temp: 97.6 F (36.4 C)  TempSrc: Oral  Resp: 10  Weight: 150 lb (68.04 kg)  SpO2: 96%  Physical Exam  Constitutional: She is oriented to person, place, and time. She appears well-developed and well-nourished. No distress.  Cardiovascular: Normal rate, regular rhythm, normal heart sounds and intact distal pulses.   Pulmonary/Chest: Effort normal and breath sounds normal. No respiratory distress. She has no wheezes.  Abdominal: Soft. Bowel sounds are normal. She exhibits no distension and no mass. There is no tenderness.  Musculoskeletal: Normal range of motion. She exhibits no edema and no tenderness.  5/5 strength of all 4 extremities, walks with quad cane, but unsteady;  Cannot get up and go w/o using arms  Neurological: She is alert and oriented to person, place, and time.  Skin: Skin is warm and dry.  Psychiatric: She has a normal mood and affect.     Labs reviewed: Basic Metabolic Panel:  Recent Labs  12/04/12 0805 01/23/13 2129 06/10/13 1411  NA 142 144 142  K 4.2 3.8 4.2  CL 104  108 102  CO2 23 24 22   GLUCOSE 105* 107* 97  BUN 9 9 9   CREATININE 0.78 0.53 0.65  CALCIUM 9.7 9.7 9.6   Liver Function Tests:  Recent Labs  12/04/12 0805  AST 19  ALT 17  ALKPHOS 61  BILITOT 0.3  PROT 6.8  CBC:  Recent Labs  12/04/12 0805 01/23/13 2129  WBC 3.9 4.6  NEUTROABS 1.8  --   HGB 14.2 13.9  HCT 41.0 40.4  MCV  92 94.6  PLT  --  199   Lipid Panel:  Recent Labs  12/04/12 0805  HDL 80  LDLCALC 98  TRIG 70  CHOLHDL 2.4    Assessment/Plan 1. Generalized weakness - agrees to outpatient PT with her granddaughter's encouragement to help with her balance and proximal muscle weakness - CBC With differential/Platelet - Comprehensive metabolic panel - Ambulatory referral to Physical Therapy -suspect this developed when she had loose stools off and on for 3 wks--now resolved  2. Mild cognitive impairment with memory loss -stable, recheck mmse next time - Comprehensive metabolic panel - Ambulatory referral to Physical Therapy  3. Gait disorder -encouraged her to attend therapy and use the quad cane (appears wrong height and may need PT to adjust it) - Comprehensive metabolic panel - Ambulatory referral to Physical Therapy  4. Essential hypertension, benign -bp at goal most of the time with current regimen--no changes - Comprehensive metabolic panel  5. DOE (dyspnea on exertion) -suspect primarily deconditioning--she also informed me that she once had radiation when she had breast cancer many years ago so may have had some damage from this - Comprehensive metabolic panel -EKG was checked which was wnl  6. Vitamin D deficiency -cont supplement plus mvi daily   7. Diarrhea -encourage probiotic and yogurt use -seems it has resolved now--etiology unclear  8. Hemorrhoids -encouraged her to use anusol cream or suppository for this--had flared due to her diarrhea.  Labs/tests ordered:   Orders Placed This Encounter  Procedures  . CBC With  differential/Platelet  . Comprehensive metabolic panel  . Ambulatory referral to Physical Therapy    Referral Priority:  Routine    Referral Type:  Physical Medicine    Referral Reason:  Specialty Services Required    Requested Specialty:  Physical Therapy    Number of Visits Requested:  1  . EKG 12-Lead    Order Specific Question:  Where should this test be performed    Answer:  OTHER    Next appt:  Keep July appt

## 2013-10-29 LAB — COMPREHENSIVE METABOLIC PANEL
ALT: 12 IU/L (ref 0–32)
AST: 18 IU/L (ref 0–40)
Albumin/Globulin Ratio: 1.5 (ref 1.1–2.5)
Albumin: 4.3 g/dL (ref 3.5–4.7)
Alkaline Phosphatase: 61 IU/L (ref 39–117)
BUN/Creatinine Ratio: 14 (ref 11–26)
BUN: 8 mg/dL (ref 8–27)
CO2: 22 mmol/L (ref 18–29)
Calcium: 9.8 mg/dL (ref 8.7–10.3)
Chloride: 103 mmol/L (ref 97–108)
Creatinine, Ser: 0.59 mg/dL (ref 0.57–1.00)
GFR calc Af Amer: 98 mL/min/{1.73_m2} (ref >59)
GFR calc non Af Amer: 85 mL/min/{1.73_m2} (ref >59)
Globulin, Total: 2.8 g/dL (ref 1.5–4.5)
Glucose: 92 mg/dL (ref 65–99)
Potassium: 4.2 mmol/L (ref 3.5–5.2)
Sodium: 143 mmol/L (ref 134–144)
Total Bilirubin: 0.3 mg/dL (ref 0.0–1.2)
Total Protein: 7.1 g/dL (ref 6.0–8.5)

## 2013-10-29 LAB — CBC WITH DIFFERENTIAL
Basophils Absolute: 0 10*3/uL (ref 0.0–0.2)
Basos: 0 %
Eos: 2 %
Eosinophils Absolute: 0.1 10*3/uL (ref 0.0–0.4)
HCT: 42.1 % (ref 34.0–46.6)
Hemoglobin: 13.9 g/dL (ref 11.1–15.9)
Immature Grans (Abs): 0 10*3/uL (ref 0.0–0.1)
Immature Granulocytes: 0 %
Lymphocytes Absolute: 1.5 10*3/uL (ref 0.7–3.1)
Lymphs: 32 %
MCH: 31.4 pg (ref 26.6–33.0)
MCHC: 33 g/dL (ref 31.5–35.7)
MCV: 95 fL (ref 79–97)
Monocytes Absolute: 0.3 10*3/uL (ref 0.1–0.9)
Monocytes: 6 %
Neutrophils Absolute: 2.8 10*3/uL (ref 1.4–7.0)
Neutrophils Relative %: 60 %
Platelets: 222 10*3/uL (ref 150–379)
RBC: 4.42 x10E6/uL (ref 3.77–5.28)
RDW: 13 % (ref 12.3–15.4)
WBC: 4.7 10*3/uL (ref 3.4–10.8)

## 2013-11-19 ENCOUNTER — Telehealth: Payer: Self-pay | Admitting: *Deleted

## 2013-11-19 NOTE — Telephone Encounter (Signed)
Beth Foster called and wanted to know what the Furosemide was being taken for. Said to call Beth Foster and let her know so they could inform the patient. Tried calling her but had to leave message to return call.

## 2013-11-29 ENCOUNTER — Ambulatory Visit (INDEPENDENT_AMBULATORY_CARE_PROVIDER_SITE_OTHER): Payer: Medicare Other | Admitting: Internal Medicine

## 2013-11-29 ENCOUNTER — Encounter: Payer: Self-pay | Admitting: Internal Medicine

## 2013-11-29 VITALS — BP 160/90 | HR 92 | Temp 97.7°F | Wt 149.6 lb

## 2013-11-29 DIAGNOSIS — R0609 Other forms of dyspnea: Secondary | ICD-10-CM

## 2013-11-29 DIAGNOSIS — R0600 Dyspnea, unspecified: Secondary | ICD-10-CM

## 2013-11-29 DIAGNOSIS — R5381 Other malaise: Secondary | ICD-10-CM

## 2013-11-29 DIAGNOSIS — R531 Weakness: Secondary | ICD-10-CM

## 2013-11-29 DIAGNOSIS — R0989 Other specified symptoms and signs involving the circulatory and respiratory systems: Secondary | ICD-10-CM

## 2013-11-29 DIAGNOSIS — R413 Other amnesia: Secondary | ICD-10-CM

## 2013-11-29 DIAGNOSIS — R5383 Other fatigue: Secondary | ICD-10-CM

## 2013-11-29 DIAGNOSIS — R269 Unspecified abnormalities of gait and mobility: Secondary | ICD-10-CM

## 2013-11-29 DIAGNOSIS — I10 Essential (primary) hypertension: Secondary | ICD-10-CM

## 2013-11-29 DIAGNOSIS — G3184 Mild cognitive impairment, so stated: Secondary | ICD-10-CM

## 2013-11-29 NOTE — Progress Notes (Signed)
Patient ID: Beth Foster, female   DOB: 04-28-1929, 80 y.o.   MRN: [id number]   Location:  [facility]  Code Status: DNR  Allergies  Allergen Reactions  . Angiotensin Receptor Blockers Itching, Swelling and Rash    Tongue swelling  . Beta Adrenergic Blockers Itching, Swelling and Rash    Tongue swelling   . Cephalexin Swelling    Tongue swelling  . Clindamycin/Lincomycin Itching, Swelling and Rash    Tongue swelling  . Amoxicillin Other (See Comments)    unknown  . Latex Rash  . Other Itching and Rash     Steroids cause head to toe rash and itching  . Reglan [Metoclopramide] Itching and Rash    Chief Complaint  Patient presents with  . Follow-up    1 month f/u  . other    ? regarding the Furosimide    HPI: Patient is a 80 y.o. white female seen in the office today for f/u on walking--is going to start outpatient therapy Wednesday.  Left leg doing better.    Is worried about her lasix pill.  bp was high after she took it the one night.  Says she drank something with electrolytes in it.  BP 180/100 one time.  Had sodium in it so she thinks that is really what elevated her pressure.    Reviewed that lasix/furosemide is the water pill.    Is now using [employer]'s version of mvi.      Ate chili last night and doesn't feel too great after that.  Family has told her to alter her routine meals.    Review of Systems:  Review of Systems  Constitutional: Negative for fever and malaise/fatigue.  HENT: Negative for congestion.   Eyes: Negative for blurred vision.  Respiratory: Negative for shortness of breath.   Cardiovascular: Negative for chest pain.  Gastrointestinal: Negative for heartburn, diarrhea, constipation, blood in stool and melena.  Genitourinary: Negative for dysuria.  Musculoskeletal: Negative for falls and myalgias.  Skin: Negative for rash.  Neurological: Negative for dizziness, loss of consciousness and weakness.    Endo/Heme/Allergies: Bruises/bleeds easily.  Psychiatric/Behavioral: Positive for memory loss.     Past Medical History  Diagnosis Date  . GERD (gastroesophageal reflux disease)   . Hypertension   . Pain in joint, pelvic region and thigh   . Vitamin D deficiency   . Viral hepatitis A without mention of hepatic coma   . Malignant neoplasm of breast (female), unspecified site   . Anxiety state, unspecified   . Unspecified cataract   . Unspecified late effects of cerebrovascular disease   . External hemorrhoids without mention of complication   . Contact dermatitis and other eczema due to other chemical products   . Bunion   . Rash and other nonspecific skin eruption   . Edema   . Viral hepatitis A without mention of hepatic coma     Past Surgical History  Procedure Laterality Date  . Cholecystectomy  1972  . Breast lumpectomy  1995  . Cardiac catheterization  2004    Social History:   reports that she has never smoked. She does not have any smokeless tobacco history on file. She reports that she does not drink alcohol or use illicit drugs.  Family History  Problem Relation Age of Onset  . Cancer Mother     lung  . Cancer Brother     brain    Medications: Patient's Medications  New Prescriptions  No medications on file  Previous Medications   CHOLECALCIFEROL (VITAMIN D) 1000 UNITS TABLET    Take 1,000 Units by mouth daily.    DILTIAZEM (DILACOR XR) 120 MG 24 HR CAPSULE    TAKE 1 CAPSULE BY MOUTH DAILY FOR BLOOD PRESSURE at night   FUROSEMIDE (LASIX) 20 MG TABLET    Take 20 mg by mouth daily as needed for edema.   MULTIPLE VITAMIN (MULTIVITAMIN) TABLET    Take 1 tablet by mouth daily.   Modified Medications   No medications on file  Discontinued Medications   No medications on file     Physical Exam: Filed Vitals:   11/29/13 1044  BP: 160/90  Pulse: 92  Temp: 97.7 F (36.5 C)  TempSrc: Oral  Weight: 149 lb 9.6 oz (67.858 kg)  SpO2: 96%  Physical Exam   Constitutional: She is oriented to person, place, and time. She appears well-developed and well-nourished. No distress.  Cardiovascular: Normal rate, regular rhythm, normal heart sounds and intact distal pulses.   Pulmonary/Chest: Effort normal and breath sounds normal. No respiratory distress.  Abdominal: Soft. Bowel sounds are normal. She exhibits no distension and no mass. There is no tenderness.  Musculoskeletal: Normal range of motion. She exhibits edema. She exhibits no tenderness.  Walks with cane;  Very mild LLE edema vs RLE  Neurological: She is alert and oriented to person, place, and time.  Skin: Skin is warm and dry.    Labs reviewed: Basic Metabolic Panel:  Recent Labs  01/23/13 2129 06/10/13 1411 10/28/13 1240  NA 144 142 143  K 3.8 4.2 4.2  CL 108 102 103  CO2 24 22 22   GLUCOSE 107* 97 92  BUN 9 9 8   CREATININE 0.53 0.65 0.59  CALCIUM 9.7 9.6 9.8   Liver Function Tests:  Recent Labs  12/04/12 0805 10/28/13 1240  AST 19 18  ALT 17 12  ALKPHOS 61 61  BILITOT 0.3 0.3  PROT 6.8 7.1  CBC:  Recent Labs  12/04/12 0805 01/23/13 2129 10/28/13 1240  WBC 3.9 4.6 4.7  NEUTROABS 1.8  --  2.8  HGB 14.2 13.9 13.9  HCT 41.0 40.4 42.1  MCV 92 94.6 95  PLT  --  199 222   Lipid Panel:  Recent Labs  12/04/12 0805  HDL 80  LDLCALC 98  TRIG 70  CHOLHDL 2.4    Assessment/Plan 1. Gait disorder -is meant to be wearing life alert regularly -starts therapy next week   2. Mild cognitive impairment with memory loss -doing ok today, not on meds for this at this time  3. DOE (dyspnea on exertion) -breathing improved a little since using lasix  4. Generalized weakness -therapy to start, no falls since last visit  5. Essential hypertension, benign Up today due to canned tomatoes in chili she's been eating again Advised to avoid this  Labs/tests ordered: none today, will check next appt Next appt:  3 mos

## 2013-12-17 ENCOUNTER — Other Ambulatory Visit: Payer: Self-pay | Admitting: Internal Medicine

## 2013-12-24 ENCOUNTER — Encounter (HOSPITAL_COMMUNITY): Payer: Self-pay | Admitting: Emergency Medicine

## 2013-12-24 ENCOUNTER — Emergency Department (HOSPITAL_COMMUNITY)
Admission: EM | Admit: 2013-12-24 | Discharge: 2013-12-24 | Disposition: A | Discharge status: Home / Self Care | Payer: Medicare Other | Attending: Emergency Medicine | Admitting: Emergency Medicine

## 2013-12-24 ENCOUNTER — Telehealth: Payer: Self-pay | Admitting: *Deleted

## 2013-12-24 ENCOUNTER — Emergency Department (HOSPITAL_COMMUNITY): Payer: Medicare Other

## 2013-12-24 DIAGNOSIS — Z853 Personal history of malignant neoplasm of breast: Secondary | ICD-10-CM | POA: Insufficient documentation

## 2013-12-24 DIAGNOSIS — IMO0002 Reserved for concepts with insufficient information to code with codable children: Secondary | ICD-10-CM | POA: Insufficient documentation

## 2013-12-24 DIAGNOSIS — Z79899 Other long term (current) drug therapy: Secondary | ICD-10-CM | POA: Diagnosis not present

## 2013-12-24 DIAGNOSIS — R42 Dizziness and giddiness: Secondary | ICD-10-CM | POA: Insufficient documentation

## 2013-12-24 DIAGNOSIS — M25559 Pain in unspecified hip: Secondary | ICD-10-CM | POA: Insufficient documentation

## 2013-12-24 DIAGNOSIS — Z91040 Latex allergy status: Secondary | ICD-10-CM | POA: Diagnosis not present

## 2013-12-24 DIAGNOSIS — Z8619 Personal history of other infectious and parasitic diseases: Secondary | ICD-10-CM | POA: Insufficient documentation

## 2013-12-24 DIAGNOSIS — Z8669 Personal history of other diseases of the nervous system and sense organs: Secondary | ICD-10-CM | POA: Diagnosis not present

## 2013-12-24 DIAGNOSIS — I10 Essential (primary) hypertension: Secondary | ICD-10-CM | POA: Diagnosis present

## 2013-12-24 DIAGNOSIS — Z872 Personal history of diseases of the skin and subcutaneous tissue: Secondary | ICD-10-CM | POA: Insufficient documentation

## 2013-12-24 DIAGNOSIS — Z8659 Personal history of other mental and behavioral disorders: Secondary | ICD-10-CM | POA: Diagnosis not present

## 2013-12-24 DIAGNOSIS — R51 Headache: Secondary | ICD-10-CM | POA: Insufficient documentation

## 2013-12-24 DIAGNOSIS — Z862 Personal history of diseases of the blood and blood-forming organs and certain disorders involving the immune mechanism: Secondary | ICD-10-CM | POA: Insufficient documentation

## 2013-12-24 DIAGNOSIS — Z8639 Personal history of other endocrine, nutritional and metabolic disease: Secondary | ICD-10-CM | POA: Insufficient documentation

## 2013-12-24 DIAGNOSIS — R519 Headache, unspecified: Secondary | ICD-10-CM

## 2013-12-24 LAB — BASIC METABOLIC PANEL
Anion gap: 13 (ref 5–15)
BUN: 9 mg/dL (ref 6–23)
CO2: 26 mEq/L (ref 19–32)
Calcium: 9.8 mg/dL (ref 8.4–10.5)
Chloride: 101 mEq/L (ref 96–112)
Creatinine, Ser: 0.58 mg/dL (ref 0.50–1.10)
GFR calc Af Amer: >90 mL/min (ref >90)
GFR calc non Af Amer: 83 mL/min — ABNORMAL LOW (ref >90)
Glucose, Bld: 107 mg/dL — ABNORMAL HIGH (ref 70–99)
Potassium: 3.5 mEq/L — ABNORMAL LOW (ref 3.7–5.3)
Sodium: 140 mEq/L (ref 137–147)

## 2013-12-24 LAB — CBC WITH DIFFERENTIAL/PLATELET
Basophils Absolute: 0 10*3/uL (ref 0.0–0.1)
Basophils Relative: 0 % (ref 0–1)
Eosinophils Absolute: 0.1 10*3/uL (ref 0.0–0.7)
Eosinophils Relative: 1 % (ref 0–5)
HCT: 41.5 % (ref 36.0–46.0)
Hemoglobin: 14 g/dL (ref 12.0–15.0)
Lymphocytes Relative: 37 % (ref 12–46)
Lymphs Abs: 1.8 10*3/uL (ref 0.7–4.0)
MCH: 32.6 pg (ref 26.0–34.0)
MCHC: 33.7 g/dL (ref 30.0–36.0)
MCV: 96.7 fL (ref 78.0–100.0)
Monocytes Absolute: 0.4 10*3/uL (ref 0.1–1.0)
Monocytes Relative: 9 % (ref 3–12)
Neutro Abs: 2.7 10*3/uL (ref 1.7–7.7)
Neutrophils Relative %: 53 % (ref 43–77)
Platelets: 202 10*3/uL (ref 150–400)
RBC: 4.29 MIL/uL (ref 3.87–5.11)
RDW: 13.1 % (ref 11.5–15.5)
WBC: 5 10*3/uL (ref 4.0–10.5)

## 2013-12-24 LAB — TROPONIN I: Troponin I: <0.3 ng/mL (ref <0.30)

## 2013-12-24 NOTE — ED Provider Notes (Addendum)
CSN: [id number]     Arrival date & time 12/24/13  1313 History   First MD Initiated Contact with Patient 12/24/13 1328     Chief Complaint  Patient presents with  . Dizziness  . Hypertension     (Consider location/radiation/quality/duration/timing/severity/associated sxs/prior Treatment) HPI  This is an 80 year old female with a history of hypertension and hemorrhagic stroke who presents with headache and dizziness.  Patient reports that she was monitoring her blood pressures this morning.  Noted her blood pressures were acutely elevated compared to normal and seemed to correspond with patient symptoms.  She reported blood pressures between 258 and 527 systolic.  Normally her blood pressures range from 130 to 140.  Patient states that at times she felt dizzy, lightheaded, and had a headache. She denies any spinning dizziness.  The dizziness is not worsened with changes in position.  Currently she is symptom free.  She denies any recent changes in her blood pressure medications.  She denies any chest pain or shortness of breath during this time.  She denies any weakness, numbness, or tingling.  Patient does state that her prescription for diltiazem is now for diltiazem ER instead of XR.  Past Medical History  Diagnosis Date  . GERD (gastroesophageal reflux disease)   . Hypertension   . Pain in joint, pelvic region and thigh   . Vitamin D deficiency   . Viral hepatitis A without mention of hepatic coma   . Anxiety state, unspecified   . Unspecified cataract   . Unspecified late effects of cerebrovascular disease   . External hemorrhoids without mention of complication   . Contact dermatitis and other eczema due to other chemical products   . Bunion   . Rash and other nonspecific skin eruption   . Edema   . Viral hepatitis A without mention of hepatic coma   . Malignant neoplasm of breast (female), unspecified site dx'd 1996    rt breast; xrt    Past Surgical History  Procedure  Laterality Date  . Cholecystectomy  1972  . Breast lumpectomy  1995  . Cardiac catheterization  2004   Family History  Problem Relation Age of Onset  . Cancer Mother     lung  . Cancer Brother     brain   History  Substance Use Topics  . Smoking status: Never Smoker   . Smokeless tobacco: Not on file  . Alcohol Use: No   OB History   Grav Para Term Preterm Abortions TAB SAB Ect Mult Living                 Review of Systems  Constitutional: Negative for fever.  Eyes: Positive for visual disturbance.  Respiratory: Negative for cough, chest tightness and shortness of breath.   Cardiovascular: Negative for chest pain.  Gastrointestinal: Negative for nausea, vomiting and abdominal pain.  Genitourinary: Negative for dysuria.  Musculoskeletal: Negative for back pain.  Neurological: Positive for light-headedness and headaches.  All other systems reviewed and are negative.     Allergies  Angiotensin receptor blockers; Beta adrenergic blockers; Cephalexin; Clindamycin/lincomycin; Amoxicillin; Eggs or egg-derived products; Latex; Other; and Reglan  Home Medications   Prior to Admission medications   Medication Sig Start Date End Date Taking? Authorizing Provider  acetaminophen (TYLENOL) 500 MG tablet Take 500 mg by mouth every 6 (six) hours as needed for headache.   Yes Historical Provider, MD  cholecalciferol (VITAMIN D) 1000 UNITS tablet Take 1,000 Units by mouth daily after lunch.  Yes Historical Provider, MD  diltiazem (DILACOR XR) 120 MG 24 hr capsule Take 120 mg by mouth at bedtime.   Yes Historical Provider, MD  furosemide (LASIX) 20 MG tablet Take 20 mg by mouth daily as needed for edema.   Yes Historical Provider, MD  Multiple Vitamin (MULTIVITAMIN) tablet Take 1 tablet by mouth daily after breakfast.    Yes Historical Provider, MD   BP 170/74  Pulse 85  Temp(Src) 98 F (36.7 C) (Oral)  Resp 17  SpO2 96% Physical Exam  Nursing note and vitals  reviewed. Constitutional: She is oriented to person, place, and time. She appears well-developed and well-nourished.  Appears younger than stated age  HENT:  Head: Normocephalic and atraumatic.  Mouth/Throat: Oropharynx is clear and moist.  Eyes: EOM are normal. Pupils are equal, round, and reactive to light.  Neck: Neck supple.  Cardiovascular: Normal rate and regular rhythm.   Murmur heard. Pulmonary/Chest: Effort normal and breath sounds normal. No respiratory distress. She has no wheezes.  Abdominal: Soft. Bowel sounds are normal. There is no tenderness. There is no rebound.  Neurological: She is alert and oriented to person, place, and time.  5 out of 5 strength in all 4 extremities, cranial nerves II through XII intact, normal gait  Skin: Skin is warm and dry.  Psychiatric: She has a normal mood and affect.    ED Course  Procedures (including critical care time) Labs Review Labs Reviewed  BASIC METABOLIC PANEL - Abnormal; Notable for the following:    Potassium 3.5 (*)    Glucose, Bld 107 (*)    GFR calc non Af Amer 83 (*)    All other components within normal limits  CBC WITH DIFFERENTIAL  TROPONIN I    Imaging Review Ct Head Wo Contrast  12/24/2013   CLINICAL DATA:  Dizziness, hypertension. Remote history of breast cancer.  EXAM: CT HEAD WITHOUT CONTRAST  TECHNIQUE: Contiguous axial images were obtained from the base of the skull through the vertex without intravenous contrast.  COMPARISON:  CT of the head January 24, 2013  FINDINGS: The ventricles and sulci are normal for age. No intraparenchymal hemorrhage, mass effect nor midline shift. Patchy supratentorial white matter hypodensities are within normal range for patient's age and though non-specific suggest sequelae of chronic small vessel ischemic disease. No acute large vascular territory infarcts. Remote left thalamus lacunar infarct.  No abnormal extra-axial fluid collections. Basal cisterns are patent. Mild to  moderate calcific atherosclerosis of the carotid siphons with tortuosity. Dolichoectatic intracranial vessels can be seen with chronic hypertension.  No skull fracture. The included ocular globes and orbital contents are non-suspicious. Trace paranasal sinus mucosal thickening without air-fluid levels. Moderate left temporomandibular osteoarthrosis, mild on the right.  IMPRESSION: No acute intracranial process.  Involutional changes. Mild to moderate white matter changes suggest chronic small vessel ischemic disease. Remote left thalamus lacunar infarct.   Electronically Signed   By: Elon Alas   On: 12/24/2013 14:56     EKG Interpretation   Date/Time:  Tuesday December 24 2013 13:24:52 EDT Ventricular Rate:  84 PR Interval:  189 QRS Duration: 95 QT Interval:  386 QTC Calculation: 456 R Axis:   16 Text Interpretation:  Sinus rhythm Left atrial enlargement Probable  anteroseptal infarct, old No significant change since last tracing  Confirmed by Elyza Whitt  MD, Loma Sousa ([id number]) on 12/24/2013 1:38:40 PM      MDM   Final diagnoses:  Essential hypertension    Patient presents with complaints of  headache, dizziness, and spotty vision corresponding to high blood pressure readings earlier today.  She is currently symptom-free.  No sign of neurologic deficit on exam.  Initial blood pressure 170/74.  Patient was recently changed from diltiazem XR to ER at the same dosage.  Given that she symptom-free, will monitor.  Basic lab work was obtained to evaluate for endorgan damage.  EKG is reassuring.  CT head was obtained and shows no evidence of acute bleed.  There is evidence of her old thalamic infarct.  Patient's symptoms may be related to hypertensive urgency; however, she is currently symptom-free.  Other considerations include TIA; however, would not expect patient to have a headache.  ADCDE score is 2.  Patient remained asymptomatic in the ER.  Discussed with patient and granddaughter discharge  with close PCP followup.  TIA is a consideration, patient has been told not to take aspirin as prior stroke with hemorrhagic.  Patient would like to be discharged home. Patient is to continue to monitor blood pressures at making a lot.  She is followed closely in the next day or 2 with her primary care physician.  Patient and granddaughter were given strict return precautions regarding new or worsening symptoms, focal weakness or numbness, vision changes or any other symptoms.  After history, exam, and medical workup I feel the patient has been appropriately medically screened and is safe for discharge home. Pertinent diagnoses were discussed with the patient. Patient was given return precautions.     Merryl Hacker, MD 12/24/13 1527  Merryl Hacker, MD 12/24/13 [phone]

## 2013-12-24 NOTE — ED Notes (Signed)
Patient states that she woke up this am with some Headache dizziness and Spots in her vision earlier this am. Right now no symptoms noted.

## 2013-12-24 NOTE — Discharge Instructions (Signed)

## 2013-12-24 NOTE — Telephone Encounter (Signed)
Patient POA called and stated that she had to take patient to the ER because of her BP and wanted to let you know. She said you Beth Foster call her if you want at # [phone]

## 2013-12-24 NOTE — ED Notes (Signed)
MD at bedside. Dr. Dina Rich at bedside.

## 2013-12-25 NOTE — Telephone Encounter (Signed)
Daughter called and left voicemail that mother was discharged from ER but still having BP issues. Called back and had to leave her a message and told her that we needed to make her mother an appointment to follow up hospital and to check her BP.

## 2014-02-23 ENCOUNTER — Other Ambulatory Visit: Payer: Self-pay | Admitting: Internal Medicine

## 2014-02-28 ENCOUNTER — Ambulatory Visit (INDEPENDENT_AMBULATORY_CARE_PROVIDER_SITE_OTHER): Payer: Medicare Other | Admitting: Internal Medicine

## 2014-02-28 ENCOUNTER — Encounter: Payer: Self-pay | Admitting: Internal Medicine

## 2014-02-28 VITALS — BP 162/86 | HR 100 | Temp 98.1°F | Resp 18 | Ht 63.0 in | Wt 151.0 lb

## 2014-02-28 DIAGNOSIS — Z5181 Encounter for therapeutic drug level monitoring: Secondary | ICD-10-CM

## 2014-02-28 DIAGNOSIS — R269 Unspecified abnormalities of gait and mobility: Secondary | ICD-10-CM

## 2014-02-28 DIAGNOSIS — Z23 Encounter for immunization: Secondary | ICD-10-CM

## 2014-02-28 DIAGNOSIS — L239 Allergic contact dermatitis, unspecified cause: Secondary | ICD-10-CM

## 2014-02-28 DIAGNOSIS — R531 Weakness: Secondary | ICD-10-CM

## 2014-02-28 DIAGNOSIS — L200 Besnier's prurigo: Secondary | ICD-10-CM

## 2014-02-28 DIAGNOSIS — I10 Essential (primary) hypertension: Secondary | ICD-10-CM

## 2014-02-28 DIAGNOSIS — G3184 Mild cognitive impairment, so stated: Secondary | ICD-10-CM

## 2014-02-28 MED ORDER — HYDROCORTISONE 1 % EX OINT: 1.0000 "application " | TOPICAL_OINTMENT | Freq: Two times a day (BID) | CUTANEOUS | Status: DC

## 2014-02-28 NOTE — Progress Notes (Signed)
Patient ID: Beth Foster, female   DOB: March 16, 1929, 80 y.o.   MRN: [id number]   Location:  [facility]  Code Status: DNR  Allergies  Allergen Reactions  . Angiotensin Receptor Blockers Itching, Swelling and Rash    Tongue swelling  . Beta Adrenergic Blockers Itching, Swelling and Rash    Tongue swelling   . Cephalexin Swelling    Tongue swelling  . Clindamycin/Lincomycin Itching, Swelling and Rash    Tongue swelling  . Amoxicillin Other (See Comments)    unknown  . Eggs Or Egg-Derived Products Other (See Comments)    Blisters in mouth after eating for 3 days  . Latex Rash  . Other Itching and Rash     Steroids cause head to toe rash and itching  . Reglan [Metoclopramide] Itching and Rash    Chief Complaint  Patient presents with  . Medical Management of Chronic Issues    HPI: Patient is a 80 y.o. white female seen in the office today for medical mgt of chronic diseases.    Under her arms, used a battery operated shaver for her axillary areas and has had irritation, bumps under there.  No change in her deodorant--uses special deodorant that is for those who have had cancer.    Acid reflux does ok if she is careful what she eats.  Had chili.  Canned foods seem to increase her bp.  We have discussed this.  Did have some after this.    Got her flu shot today.      Using her cane to walk.  Balance better, she says, but still does stagger sometimes--forgets the cane throughout her house.    Review of Systems:  Review of Systems  Constitutional: Negative for fever and malaise/fatigue.  HENT: Positive for hearing loss.   Eyes: Negative for blurred vision.  Respiratory: Negative for shortness of breath.   Cardiovascular: Negative for chest pain and leg swelling.  Gastrointestinal: Positive for heartburn. Negative for abdominal pain, blood in stool and melena.  Genitourinary: Negative for dysuria, urgency and frequency.  Musculoskeletal:  Negative for falls.  Skin: Positive for itching and rash.  Neurological: Negative for dizziness, weakness and headaches.  Endo/Heme/Allergies: Bruises/bleeds easily.  Psychiatric/Behavioral: Positive for memory loss. Negative for depression.     Past Medical History  Diagnosis Date  . GERD (gastroesophageal reflux disease)   . Hypertension   . Pain in joint, pelvic region and thigh   . Vitamin D deficiency   . Viral hepatitis A without mention of hepatic coma   . Anxiety state, unspecified   . Unspecified cataract   . Unspecified late effects of cerebrovascular disease   . External hemorrhoids without mention of complication   . Contact dermatitis and other eczema due to other chemical products   . Bunion   . Rash and other nonspecific skin eruption   . Edema   . Viral hepatitis A without mention of hepatic coma   . Malignant neoplasm of breast (female), unspecified site dx'd 1996    rt breast; xrt     Past Surgical History  Procedure Laterality Date  . Cholecystectomy  1972  . Breast lumpectomy  1995  . Cardiac catheterization  2004    Social History:   reports that she has never smoked. She does not have any smokeless tobacco history on file. She reports that she does not drink alcohol or use illicit drugs.  Family History  Problem Relation Age of Onset  .  Cancer Mother     lung  . Cancer Brother     brain    Medications: Patient's Medications  New Prescriptions   No medications on file  Previous Medications   ACETAMINOPHEN (TYLENOL) 500 MG TABLET    Take 500 mg by mouth every 6 (six) hours as needed for headache.   CHOLECALCIFEROL (VITAMIN D) 1000 UNITS TABLET    Take 1,000 Units by mouth daily after lunch.    DILTIAZEM (DILACOR XR) 120 MG 24 HR CAPSULE    Take 120 mg by mouth at bedtime. For blood pressure   FUROSEMIDE (LASIX) 20 MG TABLET    Take 20 mg by mouth daily as needed for edema.   MULTIPLE VITAMIN (MULTIVITAMIN) TABLET    Take 1 tablet by mouth  daily after breakfast.   Modified Medications   No medications on file  Discontinued Medications   DILTIAZEM (DILACOR XR) 120 MG 24 HR CAPSULE    TAKE ONE CAPSULE BY MOUTH EVERY DAY FOR BLOOD PRESSURE     Physical Exam: Filed Vitals:   02/28/14 1048  BP: 162/86  Pulse: 100  Temp: 98.1 F (36.7 C)  TempSrc: Oral  Resp: 18  Height: 5\' 3"  (1.6 m)  Weight: 151 lb (68.493 kg)  SpO2: 97%  Physical Exam  Constitutional: She is oriented to person, place, and time. She appears well-developed and well-nourished. No distress.  Cardiovascular: Normal rate, regular rhythm, normal heart sounds and intact distal pulses.   Pulmonary/Chest: Effort normal and breath sounds normal. No respiratory distress.  Abdominal: Soft. Bowel sounds are normal. She exhibits no distension and no mass. There is no tenderness.  Musculoskeletal: Normal range of motion. She exhibits no edema and no tenderness.  Neurological: She is alert and oriented to person, place, and time.  Walking with cane (but not using it--encouraged proper use)  Skin: Rash noted. There is erythema.  Erythematous patches beneath both axillas;  Has skin tags on right axilla     Labs reviewed: Basic Metabolic Panel:  Recent Labs  06/10/13 1411 10/28/13 1240 12/24/13 1402  NA 142 143 140  K 4.2 4.2 3.5*  CL 102 103 101  CO2 22 22 26   GLUCOSE 97 92 107*  BUN 9 8 9   CREATININE 0.65 0.59 0.58  CALCIUM 9.6 9.8 9.8   Liver Function Tests:  Recent Labs  10/28/13 1240  AST 18  ALT 12  ALKPHOS 61  BILITOT 0.3  PROT 7.1   No results found for this basename: LIPASE, AMYLASE,  in the last 8760 hours No results found for this basename: AMMONIA,  in the last 8760 hours CBC:  Recent Labs  10/28/13 1240 12/24/13 1402  WBC 4.7 5.0  NEUTROABS 2.8 2.7  HGB 13.9 14.0  HCT 42.1 41.5  MCV 95 96.7  PLT 222 202    Assessment/Plan 1. Essential hypertension, benign -bp elevated this am after being nonadherent with low sodium  diet and did not take meds before coming  2. Gait disorder -cont to use cane as directed  3. Mild cognitive impairment with memory loss -stable, seems more alert today  4. Generalized weakness -improved after therapy  5. Encounter for immunization -flu shot given  6. Allergic eczema -of axillary areas--seems it started as irritation after shaving, but looks like her usual eczema so will start her on a steroid cream for this  -cont deodorant she uses b/c she says this hasn't change, but if no better, will advise to change deodorants. - hydrocortisone  1 % ointment; Apply 1 application topically 2 (two) times daily. To underarms before applying deodorant  Dispense: 56 g; Refill: 1  7.  Medication monitoring---reviewed that genetic testing results were normal and all meds should be continued as ordered -refuses to take her vitamin D b/c she blames it for her headaches (likely bp related, but refuses to take meds as directed)  Labs/tests ordered:  BMP today Next appt:  3 mos  Yarelis Ambrosino L. Jamillah Camilo, D.O. [facility] [address], [place] [address] Cell Phone (Mon-Fri 8am-5pm):  [phone] On Call:  [phone] & follow prompts after 5pm & weekends Office Phone:  [phone] Office Fax:  [phone]

## 2014-03-01 LAB — BASIC METABOLIC PANEL
BUN/Creatinine Ratio: 11 (ref 11–26)
BUN: 8 mg/dL (ref 8–27)
CO2: 27 mmol/L (ref 18–29)
Calcium: 10.4 mg/dL — ABNORMAL HIGH (ref 8.7–10.3)
Chloride: 101 mmol/L (ref 97–108)
Creatinine, Ser: 0.71 mg/dL (ref 0.57–1.00)
GFR calc Af Amer: 91 mL/min/{1.73_m2} (ref >59)
GFR calc non Af Amer: 79 mL/min/{1.73_m2} (ref >59)
Glucose: 101 mg/dL — ABNORMAL HIGH (ref 65–99)
Potassium: 5.4 mmol/L — ABNORMAL HIGH (ref 3.5–5.2)
Sodium: 144 mmol/L (ref 134–144)

## 2014-03-24 ENCOUNTER — Other Ambulatory Visit: Payer: Self-pay | Admitting: *Deleted

## 2014-03-24 MED ORDER — DILTIAZEM HCL ER 120 MG PO CP24: 120.0000 mg | ORAL_CAPSULE | Freq: Every day | ORAL | Status: DC

## 2014-03-24 NOTE — Telephone Encounter (Signed)
[facility] fax Patients choice of pharmcy-[facility]

## 2014-05-30 ENCOUNTER — Ambulatory Visit: Payer: Medicare Other | Admitting: Internal Medicine

## 2014-07-03 ENCOUNTER — Ambulatory Visit (INDEPENDENT_AMBULATORY_CARE_PROVIDER_SITE_OTHER): Payer: Medicare Other | Admitting: Internal Medicine

## 2014-07-03 ENCOUNTER — Encounter: Payer: Self-pay | Admitting: Internal Medicine

## 2014-07-03 VITALS — BP 152/80 | HR 78 | Temp 97.6°F | Resp 20 | Ht 63.0 in | Wt 146.0 lb

## 2014-07-03 DIAGNOSIS — R51 Headache: Secondary | ICD-10-CM | POA: Diagnosis not present

## 2014-07-03 DIAGNOSIS — R269 Unspecified abnormalities of gait and mobility: Secondary | ICD-10-CM

## 2014-07-03 DIAGNOSIS — I10 Essential (primary) hypertension: Secondary | ICD-10-CM

## 2014-07-03 DIAGNOSIS — R079 Chest pain, unspecified: Secondary | ICD-10-CM

## 2014-07-03 DIAGNOSIS — G3184 Mild cognitive impairment, so stated: Secondary | ICD-10-CM | POA: Diagnosis not present

## 2014-07-03 DIAGNOSIS — R519 Headache, unspecified: Secondary | ICD-10-CM

## 2014-07-03 MED ORDER — FUROSEMIDE 20 MG PO TABS: 20.0000 mg | ORAL_TABLET | Freq: Every day | ORAL | Status: DC | PRN

## 2014-07-03 MED ORDER — DILTIAZEM HCL ER 180 MG PO CP24: 180.0000 mg | ORAL_CAPSULE | Freq: Every day | ORAL | Status: DC

## 2014-07-03 NOTE — Progress Notes (Signed)
Patient ID: Beth Foster, female   DOB: [birth year], 81 y.o.   MRN: [id number]   Location:  [facility] / [facility]  Code Status: DNR  Allergies  Allergen Reactions  . Angiotensin Receptor Blockers Itching, Swelling and Rash    Tongue swelling  . Beta Adrenergic Blockers Itching, Swelling and Rash    Tongue swelling   . Cephalexin Swelling    Tongue swelling  . Clindamycin/Lincomycin Itching, Swelling and Rash    Tongue swelling  . Amoxicillin Other (See Comments)    unknown  . Eggs Or Egg-Derived Products Other (See Comments)    Blisters in mouth after eating for 3 days  . Latex Rash  . Other Itching and Rash     Steroids cause head to toe rash and itching  . Reglan [Metoclopramide] Itching and Rash    Chief Complaint  Patient presents with  . Medical Management of Chronic Issues    BP issuses,     HPI: Patient is a 81 y.o. white female seen in the office today for med mgt of chronic diseases. BPs are high per her daughter--usually over 150 now.  Her older sister passed away in the past couple of weeks (had been on hospice).    Pink capsule diltiazem pill is the one she can take, but can't take orange one.  Clonidine made her dizzy in the past.  Hydralazine caused ankle swelling.    MMSE 29/30 and passed clock today. She forgets things and retells stories.    No falls since last time.  Still only sometimes uses cane--sometimes just carries it.  Also was not tall enough.    Taking a new multivitamin for >79 with increased vit D in it.    Had a sharp pain under her breast that lasted for a minute or so and quit.  Has had 2-3 episodes.  Once woke her up.  Has previously had XRT for breast cancer.  Another time, she was nervous about something.  Gets worried about storms coming and falling on her house.    Has GPS life alert button.  Can be found even in [place].    Review of Systems:  Review of Systems  Constitutional: Negative for fever.    Eyes: Negative for blurred vision.  Respiratory: Negative for shortness of breath.   Cardiovascular: Positive for chest pain. Negative for palpitations and leg swelling.  Gastrointestinal: Negative for abdominal pain.  Genitourinary: Negative for dysuria.  Musculoskeletal: Negative for falls.  Neurological: Positive for dizziness and headaches.  Psychiatric/Behavioral: Positive for memory loss. Negative for depression. The patient is nervous/anxious.      Past Medical History  Diagnosis Date  . GERD (gastroesophageal reflux disease)   . Hypertension   . Pain in joint, pelvic region and thigh   . Vitamin D deficiency   . Viral hepatitis A without mention of hepatic coma   . Anxiety state, unspecified   . Unspecified cataract   . Unspecified late effects of cerebrovascular disease   . External hemorrhoids without mention of complication   . Contact dermatitis and other eczema due to other chemical products   . Bunion   . Rash and other nonspecific skin eruption   . Edema   . Viral hepatitis A without mention of hepatic coma   . Malignant neoplasm of breast (female), unspecified site dx'd 1996    rt breast; xrt     Past Surgical History  Procedure Laterality Date  . Cholecystectomy  1972  .  Breast lumpectomy  1995  . Cardiac catheterization  2004    Social History:   reports that she has never smoked. She does not have any smokeless tobacco history on file. She reports that she does not drink alcohol or use illicit drugs.  Family History  Problem Relation Age of Onset  . Cancer Mother     lung  . Cancer Brother     brain    Medications: Patient's Medications  New Prescriptions   No medications on file  Previous Medications   ACETAMINOPHEN (TYLENOL) 500 MG TABLET    Take 500 mg by mouth every 6 (six) hours as needed for headache.   CHOLECALCIFEROL (VITAMIN D) 1000 UNITS TABLET    Take 1,000 Units by mouth daily after lunch.    DILTIAZEM (DILACOR XR) 120 MG 24 HR  CAPSULE    Take 1 capsule (120 mg total) by mouth at bedtime. For blood pressure   FUROSEMIDE (LASIX) 20 MG TABLET    Take 20 mg by mouth daily as needed for edema.   HYDROCORTISONE 1 % OINTMENT    Apply 1 application topically 2 (two) times daily. To underarms before applying deodorant   MULTIPLE VITAMIN (MULTIVITAMIN) TABLET    Take 1 tablet by mouth daily after breakfast.   Modified Medications   No medications on file  Discontinued Medications   No medications on file     Physical Exam: Filed Vitals:   07/03/14 0739  BP: 152/80  Pulse: 78  Temp: 97.6 F (36.4 C)  TempSrc: Oral  Resp: 20  Height: 5\' 3"  (1.6 m)  Weight: 146 lb (66.225 kg)  SpO2: 97%  Physical Exam  Constitutional: She is oriented to person, place, and time. She appears well-developed and well-nourished. No distress.  Cardiovascular: Normal rate, regular rhythm, normal heart sounds and intact distal pulses.   Pulmonary/Chest: Effort normal and breath sounds normal. No respiratory distress.  Abdominal: Bowel sounds are normal.  Musculoskeletal: Normal range of motion.  Walks with quad cane when she remembers to use it  Neurological: She is alert and oriented to person, place, and time.  Psychiatric: She has a normal mood and affect.    Labs reviewed: Basic Metabolic Panel:  Recent Labs  10/28/13 1240 12/24/13 1402 02/28/14 1131  NA 143 140 144  K 4.2 3.5* 5.4*  CL 103 101 101  CO2 22 26 27   GLUCOSE 92 107* 101*  BUN 8 9 8   CREATININE 0.59 0.58 0.71  CALCIUM 9.8 9.8 10.4*   Liver Function Tests:  Recent Labs  10/28/13 1240  AST 18  ALT 12  ALKPHOS 61  BILITOT 0.3  PROT 7.1   No results for input(s): LIPASE, AMYLASE in the last 8760 hours. No results for input(s): AMMONIA in the last 8760 hours. CBC:  Recent Labs  10/28/13 1240 12/24/13 1402  WBC 4.7 5.0  NEUTROABS 2.8 2.7  HGB 13.9 14.0  HCT 42.1 41.5  MCV 95 96.7  PLT 222 202   Assessment/Plan 1. Essential hypertension,  benign -will increase her diltiazem to 180mg  at bedtime from 120mg  at bedtime--needs certain brand as written in as note to pharmacy  -renew furosemide (LASIX) 20 MG tablet; Take 1 tablet (20 mg total) by mouth daily as needed for edema.  Dispense: 90 tablet; Refill: 1 - diltiazem (DILACOR XR) 180 MG 24 hr capsule; Take 1 capsule (180 mg total) by mouth at bedtime. For blood pressure  Dispense: 90 capsule; Refill: 1  2. Mild cognitive impairment  with memory loss -mmse 29/30 today and passed her clock--this is an improvement  3. Gait disorder -her granddaughter and I adjusted her cane to the proper height today b/c she was c/o being hunched over -encouraged regular cane use -also does have a gps life alert  4. Frequent headaches -seems these are due to her elevated blood pressures -her granddaughter is agreeing with increasing her diltiazem--hopefully she will also encourage her to keep taking the increased dose -emphasized importance of taking it for several days before stopping it suddenly from side effects (esp since it's the same med)  5. Chest pain, unspecified chest pain type -primarily due to stress and also has had radiation for breast cancer in the past -does not seem typical and only lasts 1-2 minutes  Labs/tests ordered:  No new today Next appt:  2 wks f/u bp  Alger Kerstein L. Kinnick Maus, D.O. [facility] [address], [place] [address] Cell Phone (Mon-Fri 8am-5pm):  [phone] On Call:  [phone] & follow prompts after 5pm & weekends Office Phone:  [phone] Office Fax:  [phone]

## 2014-07-03 NOTE — Progress Notes (Signed)
Clock passed

## 2014-07-24 ENCOUNTER — Encounter: Payer: Self-pay | Admitting: Internal Medicine

## 2014-07-24 ENCOUNTER — Ambulatory Visit (INDEPENDENT_AMBULATORY_CARE_PROVIDER_SITE_OTHER): Payer: Medicare Other | Admitting: Internal Medicine

## 2014-07-24 VITALS — BP 138/82 | HR 88 | Temp 97.7°F | Resp 18 | Ht 63.0 in | Wt 148.4 lb

## 2014-07-24 DIAGNOSIS — S60311A Abrasion of right thumb, initial encounter: Secondary | ICD-10-CM | POA: Diagnosis not present

## 2014-07-24 DIAGNOSIS — R51 Headache: Secondary | ICD-10-CM

## 2014-07-24 DIAGNOSIS — I10 Essential (primary) hypertension: Secondary | ICD-10-CM

## 2014-07-24 DIAGNOSIS — R519 Headache, unspecified: Secondary | ICD-10-CM

## 2014-07-24 DIAGNOSIS — C50911 Malignant neoplasm of unspecified site of right female breast: Secondary | ICD-10-CM

## 2014-07-24 NOTE — Progress Notes (Signed)
Patient ID: Beth Foster, female   DOB: [birth year], 81 y.o.   MRN: [id number]   Location:  [facility]  Code Status: DNR   Allergies  Allergen Reactions  . Angiotensin Receptor Blockers Itching, Swelling and Rash    Tongue swelling  . Beta Adrenergic Blockers Itching, Swelling and Rash    Tongue swelling   . Cephalexin Swelling    Tongue swelling  . Clindamycin/Lincomycin Itching, Swelling and Rash    Tongue swelling  . Amoxicillin Other (See Comments)    unknown  . Eggs Or Egg-Derived Products Other (See Comments)    Blisters in mouth after eating for 3 days  . Latex Rash  . Other Itching and Rash     Steroids cause head to toe rash and itching  . Reglan [Metoclopramide] Itching and Rash    Chief Complaint  Patient presents with  . Medical Management of Chronic Issues    HPI: Patient is a 81 y.o. white female seen in the office today for f/u on her blood pressure.  I increased her diltiazem from 120 to 180 mg last time.  BPs are mostly improved 902X-115Z systolic and 20E-02M diastolic (previously was having readings as high as 336P systolic).  She says it is making her wake up early in the morning and has headaches and nausea.  She had the headaches and nausea before.    Right thumb injury.  Review of Systems:  Review of Systems  Constitutional: Negative for fever, chills and weight loss.  HENT: Negative for hearing loss.   Eyes: Negative for blurred vision.  Respiratory: Negative for shortness of breath.   Cardiovascular: Positive for palpitations. Negative for chest pain and leg swelling.  Gastrointestinal: Positive for nausea.  Genitourinary: Negative for dysuria, urgency and frequency.  Musculoskeletal: Negative for myalgias and falls.  Skin:       Right first finger injury  Neurological: Positive for dizziness and headaches.  Psychiatric/Behavioral: Positive for memory loss.     Past Medical History  Diagnosis Date  .  GERD (gastroesophageal reflux disease)   . Hypertension   . Pain in joint, pelvic region and thigh   . Vitamin D deficiency   . Viral hepatitis A without mention of hepatic coma   . Anxiety state, unspecified   . Unspecified cataract   . Unspecified late effects of cerebrovascular disease   . External hemorrhoids without mention of complication   . Contact dermatitis and other eczema due to other chemical products   . Bunion   . Rash and other nonspecific skin eruption   . Edema   . Viral hepatitis A without mention of hepatic coma   . Malignant neoplasm of breast (female), unspecified site dx'd 1996    rt breast; xrt     Past Surgical History  Procedure Laterality Date  . Cholecystectomy  1972  . Breast lumpectomy  1995  . Cardiac catheterization  2004    Social History:   reports that she has never smoked. She does not have any smokeless tobacco history on file. She reports that she does not drink alcohol or use illicit drugs.  Family History  Problem Relation Age of Onset  . Cancer Mother     lung  . Cancer Brother     brain    Medications: Patient's Medications  New Prescriptions   No medications on file  Previous Medications   ACETAMINOPHEN (TYLENOL) 500 MG TABLET    Take 500 mg by  mouth every 6 (six) hours as needed for headache.   CHOLECALCIFEROL (VITAMIN D) 1000 UNITS TABLET    Take 1,000 Units by mouth daily after lunch.    DILTIAZEM (DILACOR XR) 180 MG 24 HR CAPSULE    Take 1 capsule (180 mg total) by mouth at bedtime. For blood pressure   FUROSEMIDE (LASIX) 20 MG TABLET    Take 1 tablet (20 mg total) by mouth daily as needed for edema.   HYDROCORTISONE 1 % OINTMENT    Apply 1 application topically 2 (two) times daily. To underarms before applying deodorant   MULTIPLE VITAMIN (MULTIVITAMIN) TABLET    Take 1 tablet by mouth daily after breakfast.   Modified Medications   No medications on file  Discontinued Medications   No medications on file      Physical Exam: Filed Vitals:   07/24/14 0736  BP: 138/82  Pulse: 88  Temp: 97.7 F (36.5 C)  TempSrc: Oral  Resp: 18  Height: 5\' 3"  (1.6 m)  Weight: 148 lb 6.4 oz (67.314 kg)  SpO2: 97%  Physical Exam  Constitutional: She is oriented to person, place, and time. She appears well-developed and well-nourished. No distress.  Cardiovascular: Normal rate, regular rhythm, normal heart sounds and intact distal pulses.   Pulmonary/Chest: Effort normal and breath sounds normal. No respiratory distress.  Abdominal: Soft. Bowel sounds are normal.  Neurological: She is alert and oriented to person, place, and time.  Skin: Skin is warm and dry. There is pallor.  Small abrasion of right thumb over PIP, mild inflammation, no drainage  Psychiatric: She has a normal mood and affect.    Labs reviewed: Basic Metabolic Panel:  Recent Labs  10/28/13 1240 12/24/13 1402 02/28/14 1131  NA 143 140 144  K 4.2 3.5* 5.4*  CL 103 101 101  CO2 22 26 27   GLUCOSE 92 107* 101*  BUN 8 9 8   CREATININE 0.59 0.58 0.71  CALCIUM 9.8 9.8 10.4*   Liver Function Tests:  Recent Labs  10/28/13 1240  AST 18  ALT 12  ALKPHOS 61  BILITOT 0.3  PROT 7.1   No results for input(s): LIPASE, AMYLASE in the last 8760 hours. No results for input(s): AMMONIA in the last 8760 hours. CBC:  Recent Labs  10/28/13 1240 12/24/13 1402  WBC 4.7 5.0  NEUTROABS 2.8 2.7  HGB 13.9 14.0  HCT 42.1 41.5  MCV 95 96.7  PLT 222 202   Assessment/Plan 1. Essential hypertension, benign -bp improved with diltiazem 180mg  daily at bedtime -says she is having nausea, headaches, and palpitations but I believe her headaches are when her bp is high and she should have fewer palpitations with a higher dose--she is frequently looking for side effects with new meds -glad her bps are mostly at goal under 150/90  2. Frequent headaches -suspect related to elevated bps--will see if they get better  3. Abrasion of right  thumb, initial encounter -injured on her dresser drawer -advised to apply a dressing that wraps around the finger so she does not lose the dressing each time she washes her hands or does dishes  4.  Right breast cancer in past:  Requests two new mastectomy/prosthetic bras -Rx provided and name of local store ([facility])  Labs/tests ordered:  No new Next appt:   3 mos  Rennee Coyne L. Deshaun Weisinger, D.O. [facility] [address], [place] [address] Cell Phone (Mon-Fri 8am-5pm):  [phone] On Call:  [phone] & follow prompts after 5pm & weekends Office Phone:  [phone] Office Fax:  [phone]

## 2014-07-24 NOTE — Addendum Note (Signed)
Addended by: Hollace Kinnier L on: 07/24/2014 11:11 AM   Modules accepted: Level of Service

## 2014-07-24 NOTE — Patient Instructions (Signed)
Continue increased dose of diltiazem  Get a dressing for your right thumb that wraps around it so it stays in place better.  You may first apply neosporin or triple antibiotic ointment.

## 2014-08-25 DIAGNOSIS — C50919 Malignant neoplasm of unspecified site of unspecified female breast: Secondary | ICD-10-CM | POA: Diagnosis not present

## 2014-08-26 ENCOUNTER — Telehealth: Payer: Self-pay | Admitting: *Deleted

## 2014-08-26 NOTE — Telephone Encounter (Signed)
Received forms from [employer] # [phone] Fax#: [phone] Order filled out for Mastectomy Supplies for Post Surgical Bra (H2992) and Silicone Breast Prosthesis (E2683)  Dx: C50.919. Given to Dr Mariea Clonts to review and sign.

## 2014-09-15 ENCOUNTER — Encounter: Payer: Self-pay | Admitting: Internal Medicine

## 2014-10-27 ENCOUNTER — Ambulatory Visit: Payer: Medicare Other | Admitting: Internal Medicine

## 2014-11-28 ENCOUNTER — Ambulatory Visit (INDEPENDENT_AMBULATORY_CARE_PROVIDER_SITE_OTHER): Payer: Medicare Other | Admitting: Internal Medicine

## 2014-11-28 ENCOUNTER — Encounter: Payer: Self-pay | Admitting: Internal Medicine

## 2014-11-28 VITALS — BP 154/80 | HR 85 | Temp 97.7°F | Resp 16 | Ht 63.0 in | Wt 148.6 lb

## 2014-11-28 DIAGNOSIS — Z13220 Encounter for screening for lipoid disorders: Secondary | ICD-10-CM

## 2014-11-28 DIAGNOSIS — R51 Headache: Secondary | ICD-10-CM | POA: Diagnosis not present

## 2014-11-28 DIAGNOSIS — R5383 Other fatigue: Secondary | ICD-10-CM | POA: Diagnosis not present

## 2014-11-28 DIAGNOSIS — R519 Headache, unspecified: Secondary | ICD-10-CM

## 2014-11-28 DIAGNOSIS — L239 Allergic contact dermatitis, unspecified cause: Secondary | ICD-10-CM

## 2014-11-28 DIAGNOSIS — L200 Besnier's prurigo: Secondary | ICD-10-CM

## 2014-11-28 DIAGNOSIS — I10 Essential (primary) hypertension: Secondary | ICD-10-CM

## 2014-11-28 DIAGNOSIS — L659 Nonscarring hair loss, unspecified: Secondary | ICD-10-CM

## 2014-11-28 DIAGNOSIS — G3184 Mild cognitive impairment, so stated: Secondary | ICD-10-CM

## 2014-11-28 DIAGNOSIS — R269 Unspecified abnormalities of gait and mobility: Secondary | ICD-10-CM | POA: Diagnosis not present

## 2014-11-28 MED ORDER — HYDROCORTISONE 1 % EX OINT: 1.0000 "application " | TOPICAL_OINTMENT | Freq: Two times a day (BID) | CUTANEOUS | Status: DC

## 2014-11-28 MED ORDER — WOMENS MULTIVITAMIN PLUS PO TABS: 1.0000 | ORAL_TABLET | Freq: Every day | ORAL | Status: DC

## 2014-11-28 NOTE — Progress Notes (Signed)
Patient ID: Beth Foster, female   DOB: Aug 27, 1928, 81 y.o.   MRN: [id number]   Location:  [facility]  Code Status: DNR Goals of Care: Advanced Directive information Does patient have an advance directive?: Yes, Type of Advance Directive: [place];Living will;Out of facility DNR (pink MOST or yellow form), Pre-existing out of facility DNR order (yellow form or pink MOST form): Yellow form placed in chart (order not valid for inpatient use), Does patient want to make changes to advanced directive?: No - Patient declined   Chief Complaint  Patient presents with  . Acute Visit    Patient c/o weakness and tired x 1 week. Yesterday patient with diarrhea and nausea   . Medication Management    Discuss adding Vit C and Iron supplement    HPI: Patient is a 81 y.o.  seen in the office today for both med mgt of chronic diseases and an acute visit due to weakness and fatigue.    2 days ago she had a sore throat and runny nose which resolved, but then she got nausea and diarrhea. Has her chronic headaches.  Says she had a fever (not recorded) off and on.  She denies eating any chili or beef stew.  Says tomatoes in stew give her loose stools.    Says something bit her end of May, early June and thinks she was allergic to it and that made her short-winded.  Has to sit down and can't get much done.  Just told her granddaughter last week that she felt bad.  She was very sob yesterday when her granddaughter called her.  She had just finished her shower and gone out to the living room.  Says some palpitations a couple of times.  Ankles are not swelling.    BP 154/80 today. Says it's been 109 and 323 systolic at home a couple of days.  BP going up and down, says weaker in the mornings when it's lower.  We have reviewed her records many times and there is no consistency.    Says she has ingrown toenail now and bunion.  Wearing comfortable sneakers.   Advised to make podiatry appt.  Review of Systems:  Review of Systems  Constitutional: Positive for chills and malaise/fatigue. Negative for fever and weight loss.  HENT: Negative for congestion.   Eyes: Negative for blurred vision.  Respiratory: Positive for cough and shortness of breath.   Cardiovascular: Negative for chest pain and leg swelling.  Gastrointestinal: Negative for abdominal pain.  Genitourinary: Negative for dysuria, urgency and frequency.  Musculoskeletal: Negative for myalgias and falls.       Poor balance  Skin: Positive for itching and rash.       eczema  Neurological: Positive for dizziness and weakness. Negative for loss of consciousness.  Psychiatric/Behavioral: Positive for memory loss. Negative for depression. The patient is nervous/anxious.     Past Medical History  Diagnosis Date  . GERD (gastroesophageal reflux disease)   . Hypertension   . Pain in joint, pelvic region and thigh   . Vitamin D deficiency   . Viral hepatitis A without mention of hepatic coma   . Anxiety state, unspecified   . Unspecified cataract   . Unspecified late effects of cerebrovascular disease   . External hemorrhoids without mention of complication   . Contact dermatitis and other eczema due to other chemical products   . Bunion   . Rash and other nonspecific  skin eruption   . Edema   . Viral hepatitis A without mention of hepatic coma   . Malignant neoplasm of breast (female), unspecified site dx'd 1996    rt breast; xrt     Past Surgical History  Procedure Laterality Date  . Cholecystectomy  1972  . Breast lumpectomy  1995  . Cardiac catheterization  2004    Allergies  Allergen Reactions  . Angiotensin Receptor Blockers Itching, Swelling and Rash    Tongue swelling  . Beta Adrenergic Blockers Itching, Swelling and Rash    Tongue swelling   . Cephalexin Swelling    Tongue swelling  . Clindamycin/Lincomycin Itching, Swelling and Rash    Tongue swelling  .  Amoxicillin Other (See Comments)    unknown  . Eggs Or Egg-Derived Products Other (See Comments)    Blisters in mouth after eating for 3 days  . Latex Rash  . Other Itching and Rash     Steroids cause head to toe rash and itching  . Reglan [Metoclopramide] Itching and Rash   Medications: Patient's Medications  New Prescriptions   No medications on file  Previous Medications   ACETAMINOPHEN (TYLENOL) 500 MG TABLET    Take 500 mg by mouth every 6 (six) hours as needed for headache.   CHOLECALCIFEROL (VITAMIN D) 1000 UNITS TABLET    Take 1,000 Units by mouth daily after lunch.    DILTIAZEM (DILACOR XR) 180 MG 24 HR CAPSULE    Take 1 capsule (180 mg total) by mouth at bedtime. For blood pressure   FUROSEMIDE (LASIX) 20 MG TABLET    Take 1 tablet (20 mg total) by mouth daily as needed for edema.   HYDROCORTISONE 1 % OINTMENT    Apply 1 application topically 2 (two) times daily. To underarms before applying deodorant  Modified Medications   No medications on file  Discontinued Medications   MULTIPLE VITAMIN (MULTIVITAMIN) TABLET    Take 1 tablet by mouth daily after breakfast.     Physical Exam: Filed Vitals:   11/28/14 0838  BP: 154/80  Pulse: 85  Temp: 97.7 F (36.5 C)  TempSrc: Oral  Resp: 16  Height: 5\' 3"  (1.6 m)  Weight: 148 lb 9.6 oz (67.405 kg)  SpO2: 95%   Physical Exam  Constitutional: She is oriented to person, place, and time. She appears well-developed and well-nourished. No distress.  Cardiovascular: Normal rate, regular rhythm, normal heart sounds and intact distal pulses.   Pulmonary/Chest: Effort normal and breath sounds normal. She has no wheezes. She has no rales.  Abdominal: Soft. Bowel sounds are normal. She exhibits no distension and no mass. There is no tenderness.  Musculoskeletal: Normal range of motion.  Unsteady gait uses quad cane  Neurological: She is alert and oriented to person, place, and time. No cranial nerve deficit.  Skin: Skin is warm and  dry.  Small cyst with blackhead on right shoulder (where pt thought she'd been bit)  Psychiatric:  Anxious     Labs reviewed: Basic Metabolic Panel:  Recent Labs  12/24/13 1402 02/28/14 1131  NA 140 144  K 3.5* 5.4*  CL 101 101  CO2 26 27  GLUCOSE 107* 101*  BUN 9 8  CREATININE 0.58 0.71  CALCIUM 9.8 10.4*   Liver Function Tests: No results for input(s): AST, ALT, ALKPHOS, BILITOT, PROT, ALBUMIN in the last 8760 hours. No results for input(s): LIPASE, AMYLASE in the last 8760 hours. No results for input(s): AMMONIA in the last 8760 hours.  CBC:  Recent Labs  12/24/13 1402  WBC 5.0  NEUTROABS 2.7  HGB 14.0  HCT 41.5  MCV 96.7  PLT 202   Lipid Panel: No results for input(s): CHOL, HDL, LDLCALC, TRIG, CHOLHDL, LDLDIRECT in the last 8760 hours. No results found for: HGBA1C  Assessment/Plan 1. Other fatigue - suspect she is now dehydrated from a viral infection with nausea, diarrhea, some resolved upper respiratory symptoms -will f/u labs: - TSH - CBC with Differential/Platelet -CMP  2. Allergic eczema - no acute concerns about this, has been stable and needs renewal of ointment - hydrocortisone 1 % ointment; Apply 1 application topically 2 (two) times daily. To underarms before applying deodorant  Dispense: 56 g; Refill: 1  3. Thinning hair - ok to take the mvi with iron in it  - Multiple Vitamins-Minerals (WOMENS MULTIVITAMIN PLUS) TABS; Take 1 tablet by mouth daily.  Dispense: 30 tablet; Refill: 5 - Comprehensive metabolic panel - TSH  4. Essential hypertension, benign -bp elevated, but she has refused changes to her medications beyond what Ive done in the past and she has not tolerated numerous previous meds  5. Frequent headaches -I suspect these are related to her blood pressure, but she refuses adjustment of her meds.    6. Mild cognitive impairment with memory loss -cont to monitor -encouraged her to get more assistance from her grandchildren who  are willing and able to help her -is still independently managing her meds and living alone, uses quad cane to ambulate   7. Gait disorder -cont quad cane use, has refused to use her walker -no recent falls  8. Lipid screening -last check was 2014, so will reassess - Lipid panel  Labs/tests ordered:   Orders Placed This Encounter  Procedures  . Comprehensive metabolic panel    Order Specific Question:  Has the patient fasted?    Answer:  Yes  . TSH  . CBC with Differential/Platelet  . Lipid panel    Order Specific Question:  Has the patient fasted?    Answer:  Yes    Next appt:  3 mos  Adana Marik L. Vencil Basnett, D.O. [facility] [address], [place] [address] Cell Phone (Mon-Fri 8am-5pm):  [phone] On Call:  [phone] & follow prompts after 5pm & weekends Office Phone:  [phone] Office Fax:  [phone]

## 2014-11-29 LAB — CBC WITH DIFFERENTIAL/PLATELET
Basophils Absolute: 0 10*3/uL (ref 0.0–0.2)
Basos: 1 %
EOS (ABSOLUTE): 0.1 10*3/uL (ref 0.0–0.4)
Eos: 2 %
Hematocrit: 41.6 % (ref 34.0–46.6)
Hemoglobin: 13.9 g/dL (ref 11.1–15.9)
Immature Grans (Abs): 0 10*3/uL (ref 0.0–0.1)
Immature Granulocytes: 0 %
Lymphocytes Absolute: 1.2 10*3/uL (ref 0.7–3.1)
Lymphs: 29 %
MCH: 32 pg (ref 26.6–33.0)
MCHC: 33.4 g/dL (ref 31.5–35.7)
MCV: 96 fL (ref 79–97)
Monocytes Absolute: 0.3 10*3/uL (ref 0.1–0.9)
Monocytes: 8 %
Neutrophils Absolute: 2.5 10*3/uL (ref 1.4–7.0)
Neutrophils: 60 %
Platelets: 210 10*3/uL (ref 150–379)
RBC: 4.35 x10E6/uL (ref 3.77–5.28)
RDW: 13.1 % (ref 12.3–15.4)
WBC: 4.1 10*3/uL (ref 3.4–10.8)

## 2014-11-29 LAB — COMPREHENSIVE METABOLIC PANEL
ALT: 14 IU/L (ref 0–32)
AST: 17 IU/L (ref 0–40)
Albumin/Globulin Ratio: 1.6 (ref 1.1–2.5)
Albumin: 4.3 g/dL (ref 3.5–4.7)
Alkaline Phosphatase: 59 IU/L (ref 39–117)
BUN/Creatinine Ratio: 14 (ref 11–26)
BUN: 8 mg/dL (ref 8–27)
Bilirubin Total: 0.5 mg/dL (ref 0.0–1.2)
CO2: 22 mmol/L (ref 18–29)
Calcium: 9.3 mg/dL (ref 8.7–10.3)
Chloride: 104 mmol/L (ref 97–108)
Creatinine, Ser: 0.59 mg/dL (ref 0.57–1.00)
GFR calc Af Amer: 97 mL/min/{1.73_m2} (ref >59)
GFR calc non Af Amer: 84 mL/min/{1.73_m2} (ref >59)
Globulin, Total: 2.7 g/dL (ref 1.5–4.5)
Glucose: 109 mg/dL — ABNORMAL HIGH (ref 65–99)
Potassium: 4.2 mmol/L (ref 3.5–5.2)
Sodium: 142 mmol/L (ref 134–144)
Total Protein: 7 g/dL (ref 6.0–8.5)

## 2014-11-29 LAB — LIPID PANEL
Chol/HDL Ratio: 2.3 ratio units (ref 0.0–4.4)
Cholesterol, Total: 217 mg/dL — ABNORMAL HIGH (ref 100–199)
HDL: 96 mg/dL (ref >39)
LDL Calculated: 109 mg/dL — ABNORMAL HIGH (ref 0–99)
Triglycerides: 62 mg/dL (ref 0–149)
VLDL Cholesterol Cal: 12 mg/dL (ref 5–40)

## 2014-11-29 LAB — TSH: TSH: 0.802 u[IU]/mL (ref 0.450–4.500)

## 2015-01-09 ENCOUNTER — Other Ambulatory Visit: Payer: Self-pay | Admitting: Internal Medicine

## 2015-01-10 ENCOUNTER — Observation Stay (HOSPITAL_COMMUNITY)
Admission: EM | Admit: 2015-01-10 | Discharge: 2015-01-13 | Disposition: A | Discharge status: Skilled Nursing Facility | Payer: Medicare Other | Attending: Internal Medicine | Admitting: Internal Medicine

## 2015-01-10 ENCOUNTER — Emergency Department (HOSPITAL_COMMUNITY): Payer: Medicare Other

## 2015-01-10 ENCOUNTER — Inpatient Hospital Stay (HOSPITAL_COMMUNITY): Payer: Medicare Other

## 2015-01-10 ENCOUNTER — Encounter (HOSPITAL_COMMUNITY): Payer: Self-pay

## 2015-01-10 DIAGNOSIS — K219 Gastro-esophageal reflux disease without esophagitis: Secondary | ICD-10-CM | POA: Diagnosis not present

## 2015-01-10 DIAGNOSIS — R0789 Other chest pain: Secondary | ICD-10-CM | POA: Insufficient documentation

## 2015-01-10 DIAGNOSIS — S0990XA Unspecified injury of head, initial encounter: Secondary | ICD-10-CM

## 2015-01-10 DIAGNOSIS — Z8673 Personal history of transient ischemic attack (TIA), and cerebral infarction without residual deficits: Secondary | ICD-10-CM | POA: Diagnosis not present

## 2015-01-10 DIAGNOSIS — R52 Pain, unspecified: Secondary | ICD-10-CM

## 2015-01-10 DIAGNOSIS — R634 Abnormal weight loss: Secondary | ICD-10-CM | POA: Insufficient documentation

## 2015-01-10 DIAGNOSIS — R269 Unspecified abnormalities of gait and mobility: Secondary | ICD-10-CM | POA: Diagnosis not present

## 2015-01-10 DIAGNOSIS — R0609 Other forms of dyspnea: Secondary | ICD-10-CM

## 2015-01-10 DIAGNOSIS — I10 Essential (primary) hypertension: Secondary | ICD-10-CM

## 2015-01-10 DIAGNOSIS — R0600 Dyspnea, unspecified: Secondary | ICD-10-CM | POA: Diagnosis present

## 2015-01-10 DIAGNOSIS — E559 Vitamin D deficiency, unspecified: Secondary | ICD-10-CM | POA: Diagnosis not present

## 2015-01-10 DIAGNOSIS — S098XXA Other specified injuries of head, initial encounter: Secondary | ICD-10-CM | POA: Diagnosis not present

## 2015-01-10 DIAGNOSIS — T148 Other injury of unspecified body region: Secondary | ICD-10-CM | POA: Diagnosis present

## 2015-01-10 DIAGNOSIS — Z9181 History of falling: Secondary | ICD-10-CM | POA: Insufficient documentation

## 2015-01-10 DIAGNOSIS — Z923 Personal history of irradiation: Secondary | ICD-10-CM | POA: Diagnosis not present

## 2015-01-10 DIAGNOSIS — R55 Syncope and collapse: Principal | ICD-10-CM

## 2015-01-10 DIAGNOSIS — M542 Cervicalgia: Secondary | ICD-10-CM | POA: Diagnosis not present

## 2015-01-10 DIAGNOSIS — K5900 Constipation, unspecified: Secondary | ICD-10-CM | POA: Diagnosis not present

## 2015-01-10 DIAGNOSIS — M1990 Unspecified osteoarthritis, unspecified site: Secondary | ICD-10-CM | POA: Diagnosis not present

## 2015-01-10 DIAGNOSIS — R739 Hyperglycemia, unspecified: Secondary | ICD-10-CM | POA: Diagnosis not present

## 2015-01-10 DIAGNOSIS — S0003XA Contusion of scalp, initial encounter: Secondary | ICD-10-CM | POA: Diagnosis not present

## 2015-01-10 DIAGNOSIS — R079 Chest pain, unspecified: Secondary | ICD-10-CM | POA: Diagnosis not present

## 2015-01-10 DIAGNOSIS — R51 Headache: Secondary | ICD-10-CM | POA: Diagnosis not present

## 2015-01-10 DIAGNOSIS — S0083XA Contusion of other part of head, initial encounter: Secondary | ICD-10-CM | POA: Diagnosis not present

## 2015-01-10 DIAGNOSIS — S01111A Laceration without foreign body of right eyelid and periocular area, initial encounter: Secondary | ICD-10-CM | POA: Diagnosis not present

## 2015-01-10 DIAGNOSIS — J189 Pneumonia, unspecified organism: Secondary | ICD-10-CM | POA: Insufficient documentation

## 2015-01-10 DIAGNOSIS — Z853 Personal history of malignant neoplasm of breast: Secondary | ICD-10-CM | POA: Diagnosis not present

## 2015-01-10 DIAGNOSIS — G3184 Mild cognitive impairment, so stated: Secondary | ICD-10-CM | POA: Diagnosis not present

## 2015-01-10 DIAGNOSIS — R5383 Other fatigue: Secondary | ICD-10-CM | POA: Diagnosis present

## 2015-01-10 DIAGNOSIS — S0512XA Contusion of eyeball and orbital tissues, left eye, initial encounter: Secondary | ICD-10-CM | POA: Diagnosis not present

## 2015-01-10 DIAGNOSIS — Y92002 Bathroom of unspecified non-institutional (private) residence single-family (private) house as the place of occurrence of the external cause: Secondary | ICD-10-CM | POA: Insufficient documentation

## 2015-01-10 DIAGNOSIS — S199XXA Unspecified injury of neck, initial encounter: Secondary | ICD-10-CM | POA: Diagnosis not present

## 2015-01-10 DIAGNOSIS — S0181XA Laceration without foreign body of other part of head, initial encounter: Secondary | ICD-10-CM

## 2015-01-10 DIAGNOSIS — E785 Hyperlipidemia, unspecified: Secondary | ICD-10-CM | POA: Diagnosis not present

## 2015-01-10 DIAGNOSIS — S0993XA Unspecified injury of face, initial encounter: Secondary | ICD-10-CM | POA: Diagnosis not present

## 2015-01-10 DIAGNOSIS — W1812XA Fall from or off toilet with subsequent striking against object, initial encounter: Secondary | ICD-10-CM | POA: Diagnosis not present

## 2015-01-10 DIAGNOSIS — T148XXA Other injury of unspecified body region, initial encounter: Secondary | ICD-10-CM

## 2015-01-10 DIAGNOSIS — J984 Other disorders of lung: Secondary | ICD-10-CM | POA: Diagnosis not present

## 2015-01-10 HISTORY — DX: Cerebral infarction, unspecified: I63.9

## 2015-01-10 LAB — URINALYSIS, ROUTINE W REFLEX MICROSCOPIC
Bilirubin Urine: NEGATIVE
Glucose, UA: 100 mg/dL — AB
Hgb urine dipstick: NEGATIVE
Ketones, ur: NEGATIVE mg/dL
Leukocytes, UA: NEGATIVE
Nitrite: NEGATIVE
Protein, ur: NEGATIVE mg/dL
Specific Gravity, Urine: 1.009 (ref 1.005–1.030)
Urobilinogen, UA: 0.2 mg/dL (ref 0.0–1.0)
pH: 7.5 (ref 5.0–8.0)

## 2015-01-10 LAB — BASIC METABOLIC PANEL
Anion gap: 8 (ref 5–15)
BUN: 8 mg/dL (ref 6–20)
CO2: 26 mmol/L (ref 22–32)
Calcium: 8.9 mg/dL (ref 8.9–10.3)
Chloride: 105 mmol/L (ref 101–111)
Creatinine, Ser: 0.67 mg/dL (ref 0.44–1.00)
GFR calc Af Amer: >60 mL/min (ref >60)
GFR calc non Af Amer: >60 mL/min (ref >60)
Glucose, Bld: 180 mg/dL — ABNORMAL HIGH (ref 65–99)
Potassium: 3.6 mmol/L (ref 3.5–5.1)
Sodium: 139 mmol/L (ref 135–145)

## 2015-01-10 LAB — TROPONIN I
Troponin I: <0.03 ng/mL (ref <0.031)
Troponin I: <0.03 ng/mL (ref <0.031)

## 2015-01-10 LAB — CBC
HCT: 41.3 % (ref 36.0–46.0)
Hemoglobin: 13.9 g/dL (ref 12.0–15.0)
MCH: 32.6 pg (ref 26.0–34.0)
MCHC: 33.7 g/dL (ref 30.0–36.0)
MCV: 96.7 fL (ref 78.0–100.0)
Platelets: 177 10*3/uL (ref 150–400)
RBC: 4.27 MIL/uL (ref 3.87–5.11)
RDW: 12.9 % (ref 11.5–15.5)
WBC: 9.2 10*3/uL (ref 4.0–10.5)

## 2015-01-10 LAB — CBG MONITORING, ED: Glucose-Capillary: 168 mg/dL — ABNORMAL HIGH (ref 65–99)

## 2015-01-10 MED ORDER — TETANUS-DIPHTH-ACELL PERTUSSIS 5-2.5-18.5 LF-MCG/0.5 IM SUSP
0.5000 mL | Freq: Once | INTRAMUSCULAR | Status: AC
  Administered 2015-01-10: 0.5 mL via INTRAMUSCULAR
  Filled 2015-01-10: qty 0.5

## 2015-01-10 MED ORDER — WOMENS MULTIVITAMIN PLUS PO TABS: 1.0000 | ORAL_TABLET | Freq: Every day | ORAL | Status: DC

## 2015-01-10 MED ORDER — ONDANSETRON HCL 4 MG/2ML IJ SOLN
4.0000 mg | Freq: Once | INTRAMUSCULAR | Status: AC
  Administered 2015-01-10: 4 mg via INTRAVENOUS
  Filled 2015-01-10: qty 2

## 2015-01-10 MED ORDER — MORPHINE SULFATE (PF) 2 MG/ML IV SOLN
2.0000 mg | Freq: Once | INTRAVENOUS | Status: AC
  Administered 2015-01-10: 2 mg via INTRAVENOUS
  Filled 2015-01-10: qty 1

## 2015-01-10 MED ORDER — DILTIAZEM HCL ER 180 MG PO CP24
180.0000 mg | ORAL_CAPSULE | Freq: Every day | ORAL | Status: DC
  Administered 2015-01-10 – 2015-01-12 (×3): 180 mg via ORAL
  Filled 2015-01-10 (×6): qty 1

## 2015-01-10 MED ORDER — ONDANSETRON HCL 4 MG PO TABS
4.0000 mg | ORAL_TABLET | Freq: Four times a day (QID) | ORAL | Status: DC | PRN
  Administered 2015-01-12: 4 mg via ORAL
  Filled 2015-01-10: qty 1

## 2015-01-10 MED ORDER — OXYCODONE HCL 5 MG PO TABS
5.0000 mg | ORAL_TABLET | ORAL | Status: DC | PRN
  Administered 2015-01-11 (×3): 5 mg via ORAL
  Filled 2015-01-10 (×4): qty 1

## 2015-01-10 MED ORDER — ADULT MULTIVITAMIN W/MINERALS CH
1.0000 | ORAL_TABLET | Freq: Every day | ORAL | Status: DC
  Administered 2015-01-10 – 2015-01-13 (×4): 1 via ORAL
  Filled 2015-01-10 (×4): qty 1

## 2015-01-10 MED ORDER — ONDANSETRON HCL 4 MG/2ML IJ SOLN
4.0000 mg | Freq: Four times a day (QID) | INTRAMUSCULAR | Status: DC | PRN
  Administered 2015-01-10 – 2015-01-11 (×3): 4 mg via INTRAVENOUS
  Filled 2015-01-10 (×3): qty 2

## 2015-01-10 MED ORDER — ACETAMINOPHEN 325 MG PO TABS
650.0000 mg | ORAL_TABLET | Freq: Four times a day (QID) | ORAL | Status: DC | PRN
  Administered 2015-01-10 – 2015-01-12 (×3): 650 mg via ORAL
  Filled 2015-01-10 (×3): qty 2

## 2015-01-10 MED ORDER — HYDROCORTISONE 1 % EX OINT
1.0000 "application " | TOPICAL_OINTMENT | Freq: Two times a day (BID) | CUTANEOUS | Status: DC
  Filled 2015-01-10: qty 28.35

## 2015-01-10 MED ORDER — POLYETHYLENE GLYCOL 3350 17 G PO PACK
17.0000 g | PACK | Freq: Every day | ORAL | Status: DC | PRN
  Administered 2015-01-12: 17 g via ORAL
  Filled 2015-01-10: qty 1

## 2015-01-10 MED ORDER — VITAMIN D 1000 UNITS PO TABS
1000.0000 [IU] | ORAL_TABLET | Freq: Every day | ORAL | Status: DC
  Administered 2015-01-10 – 2015-01-13 (×4): 1000 [IU] via ORAL
  Filled 2015-01-10 (×4): qty 1

## 2015-01-10 MED ORDER — DOCUSATE SODIUM 100 MG PO CAPS
100.0000 mg | ORAL_CAPSULE | Freq: Two times a day (BID) | ORAL | Status: DC
  Administered 2015-01-10 – 2015-01-12 (×5): 100 mg via ORAL
  Filled 2015-01-10 (×5): qty 1

## 2015-01-10 MED ORDER — MAGNESIUM CITRATE PO SOLN
1.0000 | Freq: Once | ORAL | Status: AC | PRN
  Administered 2015-01-13: 1 via ORAL
  Filled 2015-01-10: qty 296

## 2015-01-10 MED ORDER — LIDOCAINE-EPINEPHRINE (PF) 2 %-1:200000 IJ SOLN
10.0000 mL | Freq: Once | INTRAMUSCULAR | Status: AC
  Administered 2015-01-10: 10 mL via INTRADERMAL
  Filled 2015-01-10: qty 20

## 2015-01-10 MED ORDER — ACETAMINOPHEN 650 MG RE SUPP: 650.0000 mg | Freq: Four times a day (QID) | RECTAL | Status: DC | PRN

## 2015-01-10 MED ORDER — SODIUM CHLORIDE 0.9 % IJ SOLN
3.0000 mL | Freq: Two times a day (BID) | INTRAMUSCULAR | Status: DC
  Administered 2015-01-10 – 2015-01-12 (×5): 3 mL via INTRAVENOUS

## 2015-01-10 MED ORDER — DOCUSATE SODIUM 100 MG PO CAPS: 100.0000 mg | ORAL_CAPSULE | Freq: Two times a day (BID) | ORAL | Status: DC

## 2015-01-10 MED ORDER — SORBITOL 70 % SOLN
30.0000 mL | Freq: Every day | Status: DC | PRN
  Filled 2015-01-10 (×2): qty 30

## 2015-01-10 MED ORDER — ACETAMINOPHEN 325 MG PO TABS
650.0000 mg | ORAL_TABLET | ORAL | Status: DC | PRN
  Administered 2015-01-10: 650 mg via ORAL
  Filled 2015-01-10: qty 2

## 2015-01-10 MED ORDER — ALUM & MAG HYDROXIDE-SIMETH 200-200-20 MG/5ML PO SUSP: 30.0000 mL | Freq: Four times a day (QID) | ORAL | Status: DC | PRN

## 2015-01-10 MED ORDER — ENOXAPARIN SODIUM 40 MG/0.4ML ~~LOC~~ SOLN
40.0000 mg | SUBCUTANEOUS | Status: DC
  Administered 2015-01-10 – 2015-01-12 (×2): 40 mg via SUBCUTANEOUS
  Filled 2015-01-10 (×3): qty 0.4

## 2015-01-10 NOTE — H&P (Signed)
[facility] History and Physical                                                                                    Beth Foster, is a 81 y.o. female  MRN: [id number]   DOB - 06-04-1928  Admit Date - 01/10/2015  Outpatient Primary MD for the patient is 45, Jonelle Sidle, DO  Referring MD: Linker / ER  With History of -  Past Medical History  Diagnosis Date  . GERD (gastroesophageal reflux disease)   . Hypertension   . Pain in joint, pelvic region and thigh   . Vitamin D deficiency   . Viral hepatitis A without mention of hepatic coma   . Anxiety state, unspecified   . Unspecified cataract   . Unspecified late effects of cerebrovascular disease   . External hemorrhoids without mention of complication   . Contact dermatitis and other eczema due to other chemical products   . Bunion   . Rash and other nonspecific skin eruption   . Edema   . Viral hepatitis A without mention of hepatic coma   . Malignant neoplasm of breast (female), unspecified site dx'd 1996    rt breast; xrt   . Stroke       Past Surgical History  Procedure Laterality Date  . Cholecystectomy  1972  . Breast lumpectomy  1995  . Cardiac catheterization  2004    in for   Chief Complaint  Patient presents with  . Fall  . Loss of Consciousness     HPI This is a 81 year old female patient with a past medical history of remote breast cancer in 1996 status post lumpectomy and radiation therapy, hypertension, mild cognitive impairment, hyperlipidemia, reflux and vitamin D deficiency. Patient was sent to the ER via EMS after experiencing a syncopal event and collapse at home. Patient was seated on the toilet when she had a syncopal event with apparent loss of consciousness and fell to the floor. This resulted in a large contusion to the left supraorbital region as well as a laceration to the right eyebrow. Patient reports that she has been straining more to have bowel movements and has not taken any laxatives.  Patient does have an order for Lasix at home as needed for swelling but has not taken any dosages recently.  In the ER, patient was afebrile, blood pressure 161/74, pulse 90 and regular respirations 16 with room air sats 94%. Telemetry as well as EKG revealed sinus rhythm without any concerning findings. Patient's laceration was sutured in the emergency department and ice was applied to the contusion. CT of the head /cervical spine and maxillofacial bones revealed no acute intracranial injury in the soft tissue swelling on the left frontal scalp and periorbital soft tissue hematoma was visualized. Cervical spine without fracture or subluxation. Laboratory data unremarkable except for mild hyperglycemia glucose 180, glucose was 100 in the urinalysis and urine specific gravity is was 1.009.  In further discussion with the patient, in addition to the issues with constipation the patient has had unintentional weight loss over the past several months going from 155 to 148 pounds but she attributes this to poor oral  intake partly from lack of appetite but primarily because she just was not interested in preparing food. She reports she has recently begun to prepare more food and feels her weight may be increasing. She also reports intermittent issues with fatigue. She was recently evaluated in July by her primary care physician because of this. A TSH was obtained and was normal as was her CBC and no definitive causes for the fatigue identified noting patient at the same time had been treated for an acute upper rest for infection and was felt to be dehydrated. Patient has noticed a reemergence of the weakness and fatigue over the past few weeks. She reports that when she is mopping the floor in her home she has some mild chest twinges that go away when she rests. She also noticed dyspnea on exertion with activity such as mopping the floor. Her granddaughter also reports that on a recent trip while they were out walking  around a department store patient became quite short winded and had to stop to catch her breath. At baseline patient ambulates with a cane and has a documented history of gait instability. Since arrival patient is complaining of tenderness over the anterior left lateral breast that was not present prior to her fall/syncopal event.   Review of Systems   In addition to the HPI above,  No Fever-chills, myalgias or other constitutional symptoms No Headache, changes with Vision or hearing, new weakness, tingling, numbness in any extremity, No problems swallowing food or Liquids, indigestion/reflux No Cough or palpitations, orthopnea  No Abdominal pain, N/V; no melena or hematochezia, no dark tarry stools No dysuria, hematuria or flank pain No new skin rashes, lesions, masses or bruises, No new joints pains-aches No polyuria, polydypsia or polyphagia,  *A full 10 point Review of Systems was done, except as stated above, all other Review of Systems were negative.  Social History Social History  Substance Use Topics  . Smoking status: Never Smoker   . Smokeless tobacco: Not on file  . Alcohol Use: No    Resides at: Private residence  Lives with: Alone  Ambulatory status: With a cane   Family History Family History  Problem Relation Age of Onset  . Cancer Mother     lung  . Cancer Brother     brain     Prior to Admission medications   Medication Sig Start Date End Date Taking? Authorizing Provider  acetaminophen (TYLENOL) 500 MG tablet Take 500 mg by mouth every 6 (six) hours as needed for headache.    Historical Provider, MD  cholecalciferol (VITAMIN D) 1000 UNITS tablet Take 1,000 Units by mouth daily after lunch.     Historical Provider, MD  diltiazem (DILACOR XR) 180 MG 24 hr capsule Take 1 capsule (180 mg total) by mouth at bedtime. For blood pressure 07/03/14   Tiffany L Reed, DO  furosemide (LASIX) 20 MG tablet Take 1 tablet (20 mg total) by mouth daily as needed for  edema. 07/03/14   Tiffany L Reed, DO  hydrocortisone 1 % ointment Apply 1 application topically 2 (two) times daily. To underarms before applying deodorant 11/28/14   Tiffany L Reed, DO  Multiple Vitamins-Minerals (WOMENS MULTIVITAMIN PLUS) TABS Take 1 tablet by mouth daily. 11/28/14   Tiffany L Reed, DO    Allergies  Allergen Reactions  . Angiotensin Receptor Blockers Itching, Swelling and Rash    Tongue swelling  . Beta Adrenergic Blockers Itching, Swelling and Rash    Tongue swelling   .  Cephalexin Swelling    Tongue swelling  . Clindamycin/Lincomycin Itching, Swelling and Rash    Tongue swelling  . Amoxicillin Other (See Comments)    unknown  . Eggs Or Egg-Derived Products Other (See Comments)    Blisters in mouth after eating for 3 days  . Latex Rash  . Other Itching and Rash     Steroids cause head to toe rash and itching  . Reglan [Metoclopramide] Itching and Rash    Physical Exam  Vitals  Blood pressure 151/84, pulse 87, temperature 98.7 F (37.1 C), temperature source Oral, resp. rate 19, height 5\' 6"  (1.676 m), weight 148 lb (67.132 kg), SpO2 96 %.   General:  In no acute distress, appears healthy and well nourished  Psych:  Normal affect, Denies Suicidal or Homicidal ideations, Awake Alert, Oriented X 3. Speech and thought patterns are clear and appropriate, no apparent short term memory deficits  Neuro:   No focal neurological deficits, CN II through XII intact, Strength 5/5 all 4 extremities, Sensation intact all 4 extremities.  ENT:  Ears appear Normal-there is a dressing over the right eyebrow region status post repair of traumatic laceration-patient has a large bluish discolored hematoma/contusion on the left supraorbital/antral scalp area, Conjunctivae clear, PER. Moist oral mucosa without erythema or exudates.  Neck:  Supple, No lymphadenopathy appreciated  Respiratory:  Symmetrical chest wall movement, Good air movement bilaterally, CTAB. Room Air  Cardiac:   RRR, No Murmurs, no LE edema noted, no JVD, No carotid bruits, peripheral pulses palpable at 2+  Abdomen:  Positive bowel sounds, Soft, slightly tender over suprapubic region, Non distended,  No masses appreciated, no obvious hepatosplenomegaly  Skin:  No Cyanosis, Normal Skin Turgor, No Skin Rash   Extremities: Symmetrical without obvious trauma or injury,  no effusions.  Data Review  CBC  Recent Labs Lab 01/10/15 0722  WBC 9.2  HGB 13.9  HCT 41.3  PLT 177  MCV 96.7  MCH 32.6  MCHC 33.7  RDW 12.9    Chemistries   Recent Labs Lab 01/10/15 0722  NA 139  K 3.6  CL 105  CO2 26  GLUCOSE 180*  BUN 8  CREATININE 0.67  CALCIUM 8.9    estimated creatinine clearance is 49 mL/min (by C-G formula based on Cr of 0.67).  No results for input(s): TSH, T4TOTAL, T3FREE, THYROIDAB in the last 72 hours.  Invalid input(s): FREET3  Coagulation profile No results for input(s): INR, PROTIME in the last 168 hours.  No results for input(s): DDIMER in the last 72 hours.  Cardiac Enzymes No results for input(s): CKMB, TROPONINI, MYOGLOBIN in the last 168 hours.  Invalid input(s): CK  Invalid input(s): POCBNP  Urinalysis    Component Value Date/Time   COLORURINE STRAW* 01/10/2015 0941   APPEARANCEUR CLEAR 01/10/2015 0941   LABSPEC 1.009 01/10/2015 0941   PHURINE 7.5 01/10/2015 0941   GLUCOSEU 100* 01/10/2015 0941   HGBUR NEGATIVE 01/10/2015 0941   BILIRUBINUR NEGATIVE 01/10/2015 0941   KETONESUR NEGATIVE 01/10/2015 0941   PROTEINUR NEGATIVE 01/10/2015 0941   UROBILINOGEN 0.2 01/10/2015 0941   NITRITE NEGATIVE 01/10/2015 0941   LEUKOCYTESUR NEGATIVE 01/10/2015 0941    Imaging results:   Ct Head Wo Contrast  01/10/2015   CLINICAL DATA:  Fall in bathroom. Headache and scalp laceration. Neck pain. Hypertension. Previous stroke.  EXAM: CT HEAD WITHOUT CONTRAST  CT MAXILLOFACIAL WITHOUT CONTRAST  CT CERVICAL SPINE WITHOUT CONTRAST  TECHNIQUE: Multidetector CT imaging of  the head, cervical spine, and  maxillofacial structures were performed using the standard protocol without intravenous contrast. Multiplanar CT image reconstructions of the cervical spine and maxillofacial structures were also generated.  COMPARISON:  Head CT on 12/24/2013 and cervical spine CT on 01/24/2013  FINDINGS: CT HEAD FINDINGS  There is no evidence of intracranial hemorrhage, brain edema, or other signs of acute infarction. There is no evidence of intracranial mass lesion or mass effect. No abnormal extraaxial fluid collections are identified.  Old left thalamic lacunar infarct again noted. Mild chronic small vessel disease again demonstrated. No evidence hydrocephalus.  Moderate left frontal scalp hematoma is seen. No evidence of skull fracture or pneumocephalus.  CT MAXILLOFACIAL FINDINGS  No evidence of acute fracture involving the facial bones or orbits. Mild left periorbital soft tissue hematoma is noted. Globes and other intraorbital anatomy are normal in appearance. No evidence of orbital emphysema or sinus air-fluid levels. No other significant bone abnormality identified.  CT CERVICAL SPINE FINDINGS  No evidence of acute fracture, subluxation, or prevertebral soft tissue swelling.  Mild to moderate degenerative disc disease is seen at C3-4 and C5-6. Moderate bilateral facet DJD is seen at most cervical levels. Atlantoaxial degenerative changes are also noted.  IMPRESSION: Left frontal scalp and periorbital soft tissue hematoma. No evidence of skull fracture or acute intracranial abnormality.  Mild chronic small vessel disease and old left thalamic lacunar infarct.  No evidence of orbital or facial bone fracture.  No evidence of cervical spine fracture or subluxation. Degenerative spondylosis, as described above.   Electronically Signed   By: Earle Gell M.D.   On: 01/10/2015 08:18   Ct Cervical Spine Wo Contrast  01/10/2015   CLINICAL DATA:  Fall in bathroom. Headache and scalp laceration. Neck  pain. Hypertension. Previous stroke.  EXAM: CT HEAD WITHOUT CONTRAST  CT MAXILLOFACIAL WITHOUT CONTRAST  CT CERVICAL SPINE WITHOUT CONTRAST  TECHNIQUE: Multidetector CT imaging of the head, cervical spine, and maxillofacial structures were performed using the standard protocol without intravenous contrast. Multiplanar CT image reconstructions of the cervical spine and maxillofacial structures were also generated.  COMPARISON:  Head CT on 12/24/2013 and cervical spine CT on 01/24/2013  FINDINGS: CT HEAD FINDINGS  There is no evidence of intracranial hemorrhage, brain edema, or other signs of acute infarction. There is no evidence of intracranial mass lesion or mass effect. No abnormal extraaxial fluid collections are identified.  Old left thalamic lacunar infarct again noted. Mild chronic small vessel disease again demonstrated. No evidence hydrocephalus.  Moderate left frontal scalp hematoma is seen. No evidence of skull fracture or pneumocephalus.  CT MAXILLOFACIAL FINDINGS  No evidence of acute fracture involving the facial bones or orbits. Mild left periorbital soft tissue hematoma is noted. Globes and other intraorbital anatomy are normal in appearance. No evidence of orbital emphysema or sinus air-fluid levels. No other significant bone abnormality identified.  CT CERVICAL SPINE FINDINGS  No evidence of acute fracture, subluxation, or prevertebral soft tissue swelling.  Mild to moderate degenerative disc disease is seen at C3-4 and C5-6. Moderate bilateral facet DJD is seen at most cervical levels. Atlantoaxial degenerative changes are also noted.  IMPRESSION: Left frontal scalp and periorbital soft tissue hematoma. No evidence of skull fracture or acute intracranial abnormality.  Mild chronic small vessel disease and old left thalamic lacunar infarct.  No evidence of orbital or facial bone fracture.  No evidence of cervical spine fracture or subluxation. Degenerative spondylosis, as described above.    Electronically Signed   By: Sharrie Rothman.D.  On: 01/10/2015 08:18   Ct Maxillofacial Wo Cm  01/10/2015   CLINICAL DATA:  Fall in bathroom. Headache and scalp laceration. Neck pain. Hypertension. Previous stroke.  EXAM: CT HEAD WITHOUT CONTRAST  CT MAXILLOFACIAL WITHOUT CONTRAST  CT CERVICAL SPINE WITHOUT CONTRAST  TECHNIQUE: Multidetector CT imaging of the head, cervical spine, and maxillofacial structures were performed using the standard protocol without intravenous contrast. Multiplanar CT image reconstructions of the cervical spine and maxillofacial structures were also generated.  COMPARISON:  Head CT on 12/24/2013 and cervical spine CT on 01/24/2013  FINDINGS: CT HEAD FINDINGS  There is no evidence of intracranial hemorrhage, brain edema, or other signs of acute infarction. There is no evidence of intracranial mass lesion or mass effect. No abnormal extraaxial fluid collections are identified.  Old left thalamic lacunar infarct again noted. Mild chronic small vessel disease again demonstrated. No evidence hydrocephalus.  Moderate left frontal scalp hematoma is seen. No evidence of skull fracture or pneumocephalus.  CT MAXILLOFACIAL FINDINGS  No evidence of acute fracture involving the facial bones or orbits. Mild left periorbital soft tissue hematoma is noted. Globes and other intraorbital anatomy are normal in appearance. No evidence of orbital emphysema or sinus air-fluid levels. No other significant bone abnormality identified.  CT CERVICAL SPINE FINDINGS  No evidence of acute fracture, subluxation, or prevertebral soft tissue swelling.  Mild to moderate degenerative disc disease is seen at C3-4 and C5-6. Moderate bilateral facet DJD is seen at most cervical levels. Atlantoaxial degenerative changes are also noted.  IMPRESSION: Left frontal scalp and periorbital soft tissue hematoma. No evidence of skull fracture or acute intracranial abnormality.  Mild chronic small vessel disease and old left  thalamic lacunar infarct.  No evidence of orbital or facial bone fracture.  No evidence of cervical spine fracture or subluxation. Degenerative spondylosis, as described above.   Electronically Signed   By: Earle Gell M.D.   On: 01/10/2015 08:18     EKG: (Independently reviewed) sinus rhythm without any ST segment or T-wave changes that would be concerning for ischemia, rate 93 bpm, QTC 470 ms   Assessment & Plan  Principal Problem:   Syncope and collapse -Admit to telemetry -Echocardiogram -Based on history, at this juncture seems to have been precipitated by straining to have a bowel movement i.e. possible vagal etiology -see cardiac evaluation below -Check orthostatic vital signs  Active Problems:   Contusion of left supra orbit/Laceration of right eyebrow -Ice to affected areas to minimize swelling -Tylenol or oxycodone for pain    Essential hypertension, benign -Current blood pressure moderately controlled and certainly being influenced by pain -Resume home CCB; patient takes at [facility]    DOE/Exertional chest pain/Fatigue -Concerns that the symptoms may be harbingers of underlying ischemic heart disease -Echocardiogram as above -Current EKG unremarkable -Cycle troponin -Check two-view chest x-ray (also may help delineate if has rib fracture in setting of left anterior chest wall pain post syncope and collapse) -Check room air ambulatory oximetry    Mild cognitive impairment with memory loss -Family at bedside -Patient does far has been able to maintain independent living in the community    GERD  -When necessary and acid noting not on PPI or H2 blocker prior to admission    HLD -Not on medications prior to admission    Vitamin D deficiency -Continue vitamin D supplementation    Acute hyperglycemia -Check hemoglobin A1c    DVT Prophylaxis: Lovenox  Family Communication:   Several family members at bedside including granddaughter  who is power of attorney  Code  Status:  Full code  Condition: Stable   Discharge disposition: Anticipate discharge home hopefully within the next 24-48 hours pending outcome of syncope and cardiac ischemic evaluation  Time spent in minutes : 60      ELLIS,ALLISON L. ANP on 01/10/2015 at 10:57 AM  Between 7am to 7pm - Pager - [phone]  After 7pm go to [email or web address] - password [email or web address]  And look for the night coverage person covering me after hours  [facility]  Addendum  I personally evaluated patient on 01/10/2015 and agree with the above findings. Beth Foster is a pleasant 81 year old female with a past medical history of cognitive impairment, vitamin D deficiency, hypertension who currently resides home alone, was brought to the emergency department after having a a syncopal event at home. She reported waking up this morning not feeling herself, describing dizziness, lightheadedness. She had a syncopal event walking from her bathroom to her bedroom and reported dizziness just prior to her fall. Unclear how long she was out for. Her life alert activated EMS. Family members reporting that she has had multiple falls over the past several months. She also complains of having dyspnea on exertion and on occasion may feel mild chest pain with performing activities of daily living. She currently denies chest pain. We'll cycle troponins, obtain a transthoracic echocardiogram and monitor her on continuous cardiac monitoring. Consult physical therapy.

## 2015-01-10 NOTE — ED Notes (Signed)
Per EMS pt got up to use the restroom and blacked out; pt woke up on hard wood floor with some blood loss; pt has gash on right side of head; Hematoma on left side of head; Cervical spin tenderness to palpation; pt c/o of pain 9/10 on arrival; Pt a&ox 4 on arrival. Pt has hx of breast CA and HTN;

## 2015-01-10 NOTE — Progress Notes (Signed)
Pt admitted to the unit at 1311. Pt mental status is alert and oriented x 4. Pt oriented to room, staff, and call bell. Skin is intact and appropriate for ethnicity. Full assessment charted in [facility]. Call bell within reach. Visitor guidelines reviewed w/ pt and/or family.

## 2015-01-10 NOTE — Progress Notes (Signed)
Pt arrived to the unit.

## 2015-01-10 NOTE — ED Notes (Signed)
Right orbit lac cleansed and irrigated with sterile saline. Bleeding controlled. Left elbow skin tear irrigated and cleansed with sterile saline as well and wrapped with gauze wrap.

## 2015-01-10 NOTE — Progress Notes (Signed)
Received report from ED. Awaiting pt arrival.

## 2015-01-10 NOTE — ED Notes (Signed)
MD at bedside. 

## 2015-01-10 NOTE — ED Provider Notes (Signed)
LACERATION REPAIR Performed by: Renold Genta Authorized by: Jeannett Senior A Consent: Verbal consent obtained. Risks and benefits: risks, benefits and alternatives were discussed Consent given by: patient Patient identity confirmed: provided demographic data Prepped and Draped in normal sterile fashion Wound explored  Laceration Location: right eyebrow  Laceration Length: 4cm  No Foreign Bodies seen or palpated  Anesthesia: local infiltration  Local anesthetic: lidocaine 2% w epinephrine  Anesthetic total: 3 ml  Irrigation method: syringe Amount of cleaning: standard  Skin closure: prolene 6.0  Number of sutures: 6  Technique: simple interrupted  Patient tolerance: Patient tolerated the procedure well with no immediate complications.   Jeannett Senior, PA-C 01/10/15 4008  Alfonzo Beers, MD 01/10/15 [phone]

## 2015-01-10 NOTE — ED Provider Notes (Signed)
CSN: [id number]     Arrival date & time 01/10/15  8315 History   First MD Initiated Contact with Patient 01/10/15 [phone]     Chief Complaint  Patient presents with  . Fall  . Loss of Consciousness     (Consider location/radiation/quality/duration/timing/severity/associated sxs/prior Treatment) HPI  Pt presenting after syncope resulting in fall to floor.  She was in the bathroom and states she felt very weak on the commode, when she tried to stand up she fell to the floor and hit her head.  No chest pain, no palpitations.  She arrives in c-collar, c/o headache and neck pain.  Pt denies any recent fever or illness, no vomiting or diarrhea.  She had not yet eaten this morning.  She denies chest or abdominal pain. There are no other associated systemic symptoms, there are no other alleviating or modifying factors.   Past Medical History  Diagnosis Date  . GERD (gastroesophageal reflux disease)   . Hypertension   . Pain in joint, pelvic region and thigh   . Vitamin D deficiency   . Viral hepatitis A without mention of hepatic coma   . Anxiety state, unspecified   . Unspecified cataract   . Unspecified late effects of cerebrovascular disease   . External hemorrhoids without mention of complication   . Contact dermatitis and other eczema due to other chemical products   . Bunion   . Rash and other nonspecific skin eruption   . Edema   . Viral hepatitis A without mention of hepatic coma   . Malignant neoplasm of breast (female), unspecified site dx'd 1996    rt breast; xrt   . Stroke    Past Surgical History  Procedure Laterality Date  . Cholecystectomy  1972  . Breast lumpectomy  1995  . Cardiac catheterization  2004   Family History  Problem Relation Age of Onset  . Cancer Mother     lung  . Cancer Brother     brain   Social History  Substance Use Topics  . Smoking status: Never Smoker   . Smokeless tobacco: None  . Alcohol Use: No   OB History    No data available      Review of Systems  ROS reviewed and all otherwise negative except for mentioned in HPI    Allergies  Angiotensin receptor blockers; Beta adrenergic blockers; Cephalexin; Clindamycin/lincomycin; Amoxicillin; Eggs or egg-derived products; Latex; Other; and Reglan  Home Medications   Prior to Admission medications   Medication Sig Start Date End Date Taking? Authorizing Provider  acetaminophen (TYLENOL) 500 MG tablet Take 500 mg by mouth every 6 (six) hours as needed for headache.    Historical Provider, MD  cholecalciferol (VITAMIN D) 1000 UNITS tablet Take 1,000 Units by mouth daily after lunch.     Historical Provider, MD  diltiazem (DILACOR XR) 180 MG 24 hr capsule Take 1 capsule (180 mg total) by mouth at bedtime. For blood pressure 07/03/14   Tiffany L Reed, DO  furosemide (LASIX) 20 MG tablet Take 1 tablet (20 mg total) by mouth daily as needed for edema. 07/03/14   Tiffany L Reed, DO  hydrocortisone 1 % ointment Apply 1 application topically 2 (two) times daily. To underarms before applying deodorant 11/28/14   Tiffany L Reed, DO  Multiple Vitamins-Minerals (WOMENS MULTIVITAMIN PLUS) TABS Take 1 tablet by mouth daily. 11/28/14   Tiffany L Reed, DO   BP 164/81 mmHg  Pulse 90  Temp(Src) 98.7 F (37.1 C) (Oral)  Resp 20  Ht 5\' 6"  (1.676 m)  Wt 148 lb (67.132 kg)  BMI 23.90 kg/m2  SpO2 94%  Vitals reviewed Physical Exam  Physical Examination: General appearance - alert, well appearing, and in no distress Mental status - alert, oriented to person, place, and time Head- hematoma overlying left forehead, laceration to right brown Eyes - pupils equal and reactive, extraocular eye movements intact Neck- some mildine tenderness of cspine, c-collar in place Mouth - mucous membranes moist, pharynx normal without lesions, no maloclusion Chest - clear to auscultation, no wheezes, rales or rhonchi, symmetric air entry Heart - normal rate, regular rhythm, normal S1, S2, no murmurs, rubs,  clicks or gallops Abdomen - soft, nontender, nondistended, no masses or organomegaly, nabs Neurological - alert, oriented, normal speech, moving all extremities, strength and sensation intact Musculoskeletal - no joint tenderness, deformity or swelling Extremities - peripheral pulses normal, no pedal edema, no clubbing or cyanosis Skin - normal coloration and turgor, no rashes  ED Course  Procedures (including critical care time) Labs Review Labs Reviewed  BASIC METABOLIC PANEL - Abnormal; Notable for the following:    Glucose, Bld 180 (*)    All other components within normal limits  URINALYSIS, ROUTINE W REFLEX MICROSCOPIC (NOT AT [facility]) - Abnormal; Notable for the following:    Color, Urine STRAW (*)    Glucose, UA 100 (*)    All other components within normal limits  CBG MONITORING, ED - Abnormal; Notable for the following:    Glucose-Capillary 168 (*)    All other components within normal limits  CBC    Imaging Review Ct Head Wo Contrast  01/10/2015   CLINICAL DATA:  Fall in bathroom. Headache and scalp laceration. Neck pain. Hypertension. Previous stroke.  EXAM: CT HEAD WITHOUT CONTRAST  CT MAXILLOFACIAL WITHOUT CONTRAST  CT CERVICAL SPINE WITHOUT CONTRAST  TECHNIQUE: Multidetector CT imaging of the head, cervical spine, and maxillofacial structures were performed using the standard protocol without intravenous contrast. Multiplanar CT image reconstructions of the cervical spine and maxillofacial structures were also generated.  COMPARISON:  Head CT on 12/24/2013 and cervical spine CT on 01/24/2013  FINDINGS: CT HEAD FINDINGS  There is no evidence of intracranial hemorrhage, brain edema, or other signs of acute infarction. There is no evidence of intracranial mass lesion or mass effect. No abnormal extraaxial fluid collections are identified.  Old left thalamic lacunar infarct again noted. Mild chronic small vessel disease again demonstrated. No evidence hydrocephalus.  Moderate left  frontal scalp hematoma is seen. No evidence of skull fracture or pneumocephalus.  CT MAXILLOFACIAL FINDINGS  No evidence of acute fracture involving the facial bones or orbits. Mild left periorbital soft tissue hematoma is noted. Globes and other intraorbital anatomy are normal in appearance. No evidence of orbital emphysema or sinus air-fluid levels. No other significant bone abnormality identified.  CT CERVICAL SPINE FINDINGS  No evidence of acute fracture, subluxation, or prevertebral soft tissue swelling.  Mild to moderate degenerative disc disease is seen at C3-4 and C5-6. Moderate bilateral facet DJD is seen at most cervical levels. Atlantoaxial degenerative changes are also noted.  IMPRESSION: Left frontal scalp and periorbital soft tissue hematoma. No evidence of skull fracture or acute intracranial abnormality.  Mild chronic small vessel disease and old left thalamic lacunar infarct.  No evidence of orbital or facial bone fracture.  No evidence of cervical spine fracture or subluxation. Degenerative spondylosis, as described above.   Electronically Signed   By: Sharrie Rothman.D.  On: 01/10/2015 08:18   Ct Cervical Spine Wo Contrast  01/10/2015   CLINICAL DATA:  Fall in bathroom. Headache and scalp laceration. Neck pain. Hypertension. Previous stroke.  EXAM: CT HEAD WITHOUT CONTRAST  CT MAXILLOFACIAL WITHOUT CONTRAST  CT CERVICAL SPINE WITHOUT CONTRAST  TECHNIQUE: Multidetector CT imaging of the head, cervical spine, and maxillofacial structures were performed using the standard protocol without intravenous contrast. Multiplanar CT image reconstructions of the cervical spine and maxillofacial structures were also generated.  COMPARISON:  Head CT on 12/24/2013 and cervical spine CT on 01/24/2013  FINDINGS: CT HEAD FINDINGS  There is no evidence of intracranial hemorrhage, brain edema, or other signs of acute infarction. There is no evidence of intracranial mass lesion or mass effect. No abnormal extraaxial  fluid collections are identified.  Old left thalamic lacunar infarct again noted. Mild chronic small vessel disease again demonstrated. No evidence hydrocephalus.  Moderate left frontal scalp hematoma is seen. No evidence of skull fracture or pneumocephalus.  CT MAXILLOFACIAL FINDINGS  No evidence of acute fracture involving the facial bones or orbits. Mild left periorbital soft tissue hematoma is noted. Globes and other intraorbital anatomy are normal in appearance. No evidence of orbital emphysema or sinus air-fluid levels. No other significant bone abnormality identified.  CT CERVICAL SPINE FINDINGS  No evidence of acute fracture, subluxation, or prevertebral soft tissue swelling.  Mild to moderate degenerative disc disease is seen at C3-4 and C5-6. Moderate bilateral facet DJD is seen at most cervical levels. Atlantoaxial degenerative changes are also noted.  IMPRESSION: Left frontal scalp and periorbital soft tissue hematoma. No evidence of skull fracture or acute intracranial abnormality.  Mild chronic small vessel disease and old left thalamic lacunar infarct.  No evidence of orbital or facial bone fracture.  No evidence of cervical spine fracture or subluxation. Degenerative spondylosis, as described above.   Electronically Signed   By: Earle Gell M.D.   On: 01/10/2015 08:18   Ct Maxillofacial Wo Cm  01/10/2015   CLINICAL DATA:  Fall in bathroom. Headache and scalp laceration. Neck pain. Hypertension. Previous stroke.  EXAM: CT HEAD WITHOUT CONTRAST  CT MAXILLOFACIAL WITHOUT CONTRAST  CT CERVICAL SPINE WITHOUT CONTRAST  TECHNIQUE: Multidetector CT imaging of the head, cervical spine, and maxillofacial structures were performed using the standard protocol without intravenous contrast. Multiplanar CT image reconstructions of the cervical spine and maxillofacial structures were also generated.  COMPARISON:  Head CT on 12/24/2013 and cervical spine CT on 01/24/2013  FINDINGS: CT HEAD FINDINGS  There is no  evidence of intracranial hemorrhage, brain edema, or other signs of acute infarction. There is no evidence of intracranial mass lesion or mass effect. No abnormal extraaxial fluid collections are identified.  Old left thalamic lacunar infarct again noted. Mild chronic small vessel disease again demonstrated. No evidence hydrocephalus.  Moderate left frontal scalp hematoma is seen. No evidence of skull fracture or pneumocephalus.  CT MAXILLOFACIAL FINDINGS  No evidence of acute fracture involving the facial bones or orbits. Mild left periorbital soft tissue hematoma is noted. Globes and other intraorbital anatomy are normal in appearance. No evidence of orbital emphysema or sinus air-fluid levels. No other significant bone abnormality identified.  CT CERVICAL SPINE FINDINGS  No evidence of acute fracture, subluxation, or prevertebral soft tissue swelling.  Mild to moderate degenerative disc disease is seen at C3-4 and C5-6. Moderate bilateral facet DJD is seen at most cervical levels. Atlantoaxial degenerative changes are also noted.  IMPRESSION: Left frontal scalp and periorbital soft tissue hematoma.  No evidence of skull fracture or acute intracranial abnormality.  Mild chronic small vessel disease and old left thalamic lacunar infarct.  No evidence of orbital or facial bone fracture.  No evidence of cervical spine fracture or subluxation. Degenerative spondylosis, as described above.   Electronically Signed   By: Earle Gell M.D.   On: 01/10/2015 08:18   I have personally reviewed and evaluated these images and lab results as part of my medical decision-making.   EKG Interpretation   Date/Time:  Saturday January 10 2015 07:01:00 EDT Ventricular Rate:  93 PR Interval:  198 QRS Duration: 97 QT Interval:  378 QTC Calculation: 470 R Axis:   25 Text Interpretation:  Sinus rhythm No significant change since last  tracing Confirmed by [facility]  MD, MARTHA [phone]) on 01/10/2015 7:20:56 AM      MDM   Final  diagnoses:  Syncope, unspecified syncope type  Head injury, initial encounter  Hematoma  Laceration of face, initial encounter    Pt presenting after syncope causing a fall- she has hematoma of forehead, brow laceration, no other areas of pain.  Ct scans are reassuring and afterwards, c-collar was cleared by me.  Laceration repaired by PA under my supervision.  EKG reassuring, not significantly anemia, urine reassuring as well.    10:23 AM d/w alison- triad midlevel, pt will be admitted to telemetry bed.    Alfonzo Beers, MD 01/10/15 [phone]

## 2015-01-10 NOTE — ED Notes (Signed)
Hospitalist at bedside 

## 2015-01-10 NOTE — ED Notes (Signed)
CBG 168. 

## 2015-01-11 ENCOUNTER — Inpatient Hospital Stay (HOSPITAL_COMMUNITY): Payer: Medicare Other

## 2015-01-11 DIAGNOSIS — R55 Syncope and collapse: Secondary | ICD-10-CM | POA: Diagnosis not present

## 2015-01-11 LAB — TROPONIN I: Troponin I: <0.03 ng/mL (ref <0.031)

## 2015-01-11 LAB — GLUCOSE, CAPILLARY: Glucose-Capillary: 112 mg/dL — ABNORMAL HIGH (ref 65–99)

## 2015-01-11 MED ORDER — SODIUM CHLORIDE 0.9 % IV SOLN
INTRAVENOUS | Status: DC
  Administered 2015-01-11 – 2015-01-13 (×3): via INTRAVENOUS

## 2015-01-11 MED ORDER — LEVOFLOXACIN IN D5W 750 MG/150ML IV SOLN
750.0000 mg | INTRAVENOUS | Status: DC
  Filled 2015-01-11: qty 150

## 2015-01-11 MED ORDER — LEVOFLOXACIN IN D5W 750 MG/150ML IV SOLN: 750.0000 mg | INTRAVENOUS | Status: DC

## 2015-01-11 MED ORDER — LEVOFLOXACIN 750 MG PO TABS
750.0000 mg | ORAL_TABLET | ORAL | Status: DC
  Administered 2015-01-11: 750 mg via ORAL
  Filled 2015-01-11: qty 1

## 2015-01-11 MED ORDER — RISAQUAD PO CAPS
2.0000 | ORAL_CAPSULE | Freq: Every day | ORAL | Status: DC
  Administered 2015-01-11 – 2015-01-13 (×3): 2 via ORAL
  Filled 2015-01-11 (×3): qty 2

## 2015-01-11 NOTE — Progress Notes (Signed)
ANTIBIOTIC CONSULT NOTE - INITIAL  Pharmacy Consult for levofloxacin Indication: CAP  Allergies  Allergen Reactions  . Amoxicillin Anaphylaxis and Swelling  . Angiotensin Receptor Blockers Anaphylaxis, Itching and Rash    Tongue swelling  . Beta Adrenergic Blockers Anaphylaxis, Itching and Rash    Tongue swelling   . Cephalexin Anaphylaxis    Tongue swelling  . Clindamycin/Lincomycin Anaphylaxis, Itching and Rash    Tongue swelling  . Eggs Or Egg-Derived Products Other (See Comments)    Blisters in mouth after eating for 3 days, mild reaction egg-derived vaccines  . Tape Other (See Comments)    Pulls skin off.  Please use "paper" tape.  . Corticosteroids Itching and Rash  . Hydrocortisone Itching and Rash  . Latex Rash  . Other Itching and Rash    Pine allergy, Fragrance free soaps and laundry products  . Reglan [Metoclopramide] Itching and Rash    Patient Measurements: Height: 5\' 6"  (167.6 cm) Weight: 147 lb 11.3 oz (67 kg) IBW/kg (Calculated) : 59.3  Vital Signs: Temp: 98 F (36.7 C) (08/21 0531) Temp Source: Oral (08/21 0531) BP: 132/69 mmHg (08/21 0531) Pulse Rate: 86 (08/21 0531) Intake/Output from previous day: 08/20 0701 - 08/21 0700 In: 120 [P.O.:120] Out: -  Intake/Output from this shift:    Labs:  Recent Labs  01/10/15 0722  WBC 9.2  HGB 13.9  PLT 177  CREATININE 0.67   Estimated Creatinine Clearance: 49 mL/min (by C-G formula based on Cr of 0.67). No results for input(s): VANCOTROUGH, VANCOPEAK, VANCORANDOM, GENTTROUGH, GENTPEAK, GENTRANDOM, TOBRATROUGH, TOBRAPEAK, TOBRARND, AMIKACINPEAK, AMIKACINTROU, AMIKACIN in the last 72 hours.   Microbiology: No results found for this or any previous visit (from the past 720 hour(s)).  Medical History: Past Medical History  Diagnosis Date  . GERD (gastroesophageal reflux disease)   . Hypertension   . Pain in joint, pelvic region and thigh   . Vitamin D deficiency   . Viral hepatitis A without  mention of hepatic coma   . Anxiety state, unspecified   . Unspecified cataract   . Unspecified late effects of cerebrovascular disease   . External hemorrhoids without mention of complication   . Contact dermatitis and other eczema due to other chemical products   . Bunion   . Rash and other nonspecific skin eruption   . Edema   . Viral hepatitis A without mention of hepatic coma   . Malignant neoplasm of breast (female), unspecified site dx'd 1996    rt breast; xrt   . Stroke     Medications:  Prescriptions prior to admission  Medication Sig Dispense Refill Last Dose  . cholecalciferol (VITAMIN D) 1000 UNITS tablet Take 1,000 Units by mouth daily after lunch.    01/09/2015 at Unknown time  . diltiazem (DILACOR XR) 180 MG 24 hr capsule Take 1 capsule (180 mg total) by mouth at bedtime. For blood pressure 90 capsule 1 01/09/2015 at Unknown time  . furosemide (LASIX) 20 MG tablet Take 1 tablet (20 mg total) by mouth daily as needed for edema. 90 tablet 1 Past Month at Unknown time  . Menthol (HALLS COUGH DROPS MT) Use as directed 1 lozenge in the mouth or throat 2 (two) times daily as needed (for cough).   01/09/2015 at Unknown time  . Multiple Vitamins-Minerals (WOMENS MULTIVITAMIN PLUS) TABS Take 1 tablet by mouth daily. 30 tablet 5 01/09/2015 at Unknown time  . acetaminophen (TYLENOL) 500 MG tablet Take 500 mg by mouth daily as needed for headache.  01/05/2015   Assessment: 81 y/o female admitted after fall and LOC. Pharmacy consulted to begin Levaquin for CAP. She is afebrile, WBC are normal, and she is taking PO's. Renal function is normal. CXR shows atelectasis vs airspace disease.  Goal of Therapy:  Eradication of infection  Plan:  - Levaquin 750 mg PO q48h for 5 days - Pharmacy signing off, please re-consult if needed  [facility], [place].D., BCPS Clinical Pharmacist Pager: [phone] 01/11/2015 9:36 AM

## 2015-01-11 NOTE — Progress Notes (Signed)
  Echocardiogram 2D Echocardiogram has been performed.  Beth Foster 01/11/2015, 12:21 PM

## 2015-01-11 NOTE — Progress Notes (Signed)
Patient Demographics  Beth Foster, is a 81 y.o. female, DOB - 1928-08-07, TKZ:[id number]  Admit date - 01/10/2015   Admitting Physician Kelvin Cellar, MD  Outpatient Primary MD for the patient is REED, TIFFANY, DO  LOS - 1   Chief Complaint  Patient presents with  . Fall  . Loss of Consciousness         Subjective:   Beth Foster today has,  No chest pain, No abdominal pain - No Nausea, No new weakness tingling or numbness, denies shortness of breath, and planes of cough, reports mild occasional headache.  Assessment & Plan    Principal Problem:   Syncope and collapse Active Problems:   Mild cognitive impairment with memory loss   GERD (gastroesophageal reflux disease)   HLD (hyperlipidemia)   Essential hypertension, benign   Vitamin D deficiency   Contusion of left supra orbit   Laceration of right eyebrow   DOE (dyspnea on exertion)   Exertional chest pain   Fatigue   Acute hyperglycemia  Syncope and collapse  - Most likely related due to dehydration and infectious process  - Negative urinalysis , chest x-ray for possible early pneumonia in left lung . -  continue to monitor on telemetry , continue with IV fluid . Continue to treat CAP. - 2-D echo EF 50-55%, no regional wall motion abnormality , grade 1 diastolic dysfunction .  Early community-acquired pneumonia - X-ray showing evolving left lung opacity, start on IV levofloxacin.   hydration and weakness -  Continue with IV fluids - PT consult   vitamin D deficiency - Continue with supplements  Hyperglycemia - follow On glycohemoglobin  Hypertension - Continue with diltiazem  Code Status: full  Family Communication: daughter at bedside   Disposition Plan: pending PT evaluation   Procedures  None   Consults   None    Medications  Scheduled Meds: . acidophilus  2 capsule Oral Daily  . cholecalciferol   1,000 Units Oral QPC lunch  . diltiazem  180 mg Oral QHS  . docusate sodium  100 mg Oral BID  . enoxaparin (LOVENOX) injection  40 mg Subcutaneous Q24H  . [START ON 01/12/2015] levofloxacin (LEVAQUIN) IV  750 mg Intravenous Q48H  . multivitamin with minerals  1 tablet Oral Daily  . sodium chloride  3 mL Intravenous Q12H   Continuous Infusions: . sodium chloride     PRN Meds:.acetaminophen **OR** acetaminophen, alum & mag hydroxide-simeth, magnesium citrate, ondansetron **OR** ondansetron (ZOFRAN) IV, oxyCODONE, polyethylene glycol, sorbitol  DVT Prophylaxis  Lovenox -  Lab Results  Component Value Date   PLT 177 01/10/2015    Antibiotics    Anti-infectives    Start     Dose/Rate Route Frequency Ordered Stop   01/13/15 1200  levofloxacin (LEVAQUIN) IVPB 750 mg  Status:  Discontinued     750 mg 100 mL/hr over 90 Minutes Intravenous Every 24 hours 01/11/15 1520 01/11/15 1522   01/12/15 1000  levofloxacin (LEVAQUIN) IVPB 750 mg     750 mg 100 mL/hr over 90 Minutes Intravenous Every 48 hours 01/11/15 1529     01/11/15 1530  levofloxacin (LEVAQUIN) IVPB 750 mg  Status:  Discontinued     750 mg 100 mL/hr over 90 Minutes Intravenous  Every 48 hours 01/11/15 1522 01/11/15 1529   01/11/15 1000  levofloxacin (LEVAQUIN) tablet 750 mg  Status:  Discontinued     750 mg Oral Every 48 hours 01/11/15 0942 01/11/15 1520          Objective:   Filed Vitals:   01/11/15 0003 01/11/15 0500 01/11/15 0531 01/11/15 1309  BP: 155/78  132/69 152/74  Pulse: 100  86 89  Temp: 98.4 F (36.9 C)  98 F (36.7 C) 98 F (36.7 C)  TempSrc: Oral  Oral Oral  Resp: 18  18 19   Height:      Weight:  67 kg (147 lb 11.3 oz)    SpO2: 94%  93% 97%    Wt Readings from Last 3 Encounters:  01/11/15 67 kg (147 lb 11.3 oz)  11/28/14 67.405 kg (148 lb 9.6 oz)  07/24/14 67.314 kg (148 lb 6.4 oz)     Intake/Output Summary (Last 24 hours) at 01/11/15 1529 Last data filed at 01/11/15 1452  Gross per 24 hour   Intake    440 ml  Output      0 ml  Net    440 ml     Physical Exam  Awake Alert, Oriented X 3, No new F.N deficits, Normal affect Left supraorbital/forehead hematoma/contusion, right eyebrow laceration status post repair. Supple Neck,No JVD, No cervical lymphadenopathy appriciated.  Symmetrical Chest wall movement, Good air movement bilaterally,  RRR,No Gallops,Rubs or new Murmurs, No Parasternal Heave +ve B.Sounds, Abd Soft, No tenderness, No organomegaly appriciated, No rebound - guarding or rigidity. No Cyanosis, Clubbing or edema, No new Rash or bruise     Data Review   Micro Results No results found for this or any previous visit (from the past 240 hour(s)).  Radiology Reports Dg Chest 2 View  01/10/2015   CLINICAL DATA:  Syncope  EXAM: CHEST  2 VIEW  COMPARISON:  None.  FINDINGS: Upper normal heart size. Minimal patchy density at the lateral left base. Lungs are otherwise clear. No pneumothorax. No pleural effusion.  IMPRESSION: Minimal patchy atelectasis versus airspace disease at the lateral left base.   Electronically Signed   By: Marybelle Killings M.D.   On: 01/10/2015 12:58   Ct Head Wo Contrast  01/10/2015   CLINICAL DATA:  Fall in bathroom. Headache and scalp laceration. Neck pain. Hypertension. Previous stroke.  EXAM: CT HEAD WITHOUT CONTRAST  CT MAXILLOFACIAL WITHOUT CONTRAST  CT CERVICAL SPINE WITHOUT CONTRAST  TECHNIQUE: Multidetector CT imaging of the head, cervical spine, and maxillofacial structures were performed using the standard protocol without intravenous contrast. Multiplanar CT image reconstructions of the cervical spine and maxillofacial structures were also generated.  COMPARISON:  Head CT on 12/24/2013 and cervical spine CT on 01/24/2013  FINDINGS: CT HEAD FINDINGS  There is no evidence of intracranial hemorrhage, brain edema, or other signs of acute infarction. There is no evidence of intracranial mass lesion or mass effect. No abnormal extraaxial fluid  collections are identified.  Old left thalamic lacunar infarct again noted. Mild chronic small vessel disease again demonstrated. No evidence hydrocephalus.  Moderate left frontal scalp hematoma is seen. No evidence of skull fracture or pneumocephalus.  CT MAXILLOFACIAL FINDINGS  No evidence of acute fracture involving the facial bones or orbits. Mild left periorbital soft tissue hematoma is noted. Globes and other intraorbital anatomy are normal in appearance. No evidence of orbital emphysema or sinus air-fluid levels. No other significant bone abnormality identified.  CT CERVICAL SPINE FINDINGS  No evidence of  acute fracture, subluxation, or prevertebral soft tissue swelling.  Mild to moderate degenerative disc disease is seen at C3-4 and C5-6. Moderate bilateral facet DJD is seen at most cervical levels. Atlantoaxial degenerative changes are also noted.  IMPRESSION: Left frontal scalp and periorbital soft tissue hematoma. No evidence of skull fracture or acute intracranial abnormality.  Mild chronic small vessel disease and old left thalamic lacunar infarct.  No evidence of orbital or facial bone fracture.  No evidence of cervical spine fracture or subluxation. Degenerative spondylosis, as described above.   Electronically Signed   By: Earle Gell M.D.   On: 01/10/2015 08:18   Ct Cervical Spine Wo Contrast  01/10/2015   CLINICAL DATA:  Fall in bathroom. Headache and scalp laceration. Neck pain. Hypertension. Previous stroke.  EXAM: CT HEAD WITHOUT CONTRAST  CT MAXILLOFACIAL WITHOUT CONTRAST  CT CERVICAL SPINE WITHOUT CONTRAST  TECHNIQUE: Multidetector CT imaging of the head, cervical spine, and maxillofacial structures were performed using the standard protocol without intravenous contrast. Multiplanar CT image reconstructions of the cervical spine and maxillofacial structures were also generated.  COMPARISON:  Head CT on 12/24/2013 and cervical spine CT on 01/24/2013  FINDINGS: CT HEAD FINDINGS  There is no  evidence of intracranial hemorrhage, brain edema, or other signs of acute infarction. There is no evidence of intracranial mass lesion or mass effect. No abnormal extraaxial fluid collections are identified.  Old left thalamic lacunar infarct again noted. Mild chronic small vessel disease again demonstrated. No evidence hydrocephalus.  Moderate left frontal scalp hematoma is seen. No evidence of skull fracture or pneumocephalus.  CT MAXILLOFACIAL FINDINGS  No evidence of acute fracture involving the facial bones or orbits. Mild left periorbital soft tissue hematoma is noted. Globes and other intraorbital anatomy are normal in appearance. No evidence of orbital emphysema or sinus air-fluid levels. No other significant bone abnormality identified.  CT CERVICAL SPINE FINDINGS  No evidence of acute fracture, subluxation, or prevertebral soft tissue swelling.  Mild to moderate degenerative disc disease is seen at C3-4 and C5-6. Moderate bilateral facet DJD is seen at most cervical levels. Atlantoaxial degenerative changes are also noted.  IMPRESSION: Left frontal scalp and periorbital soft tissue hematoma. No evidence of skull fracture or acute intracranial abnormality.  Mild chronic small vessel disease and old left thalamic lacunar infarct.  No evidence of orbital or facial bone fracture.  No evidence of cervical spine fracture or subluxation. Degenerative spondylosis, as described above.   Electronically Signed   By: Earle Gell M.D.   On: 01/10/2015 08:18   Ct Maxillofacial Wo Cm  01/10/2015   CLINICAL DATA:  Fall in bathroom. Headache and scalp laceration. Neck pain. Hypertension. Previous stroke.  EXAM: CT HEAD WITHOUT CONTRAST  CT MAXILLOFACIAL WITHOUT CONTRAST  CT CERVICAL SPINE WITHOUT CONTRAST  TECHNIQUE: Multidetector CT imaging of the head, cervical spine, and maxillofacial structures were performed using the standard protocol without intravenous contrast. Multiplanar CT image reconstructions of the  cervical spine and maxillofacial structures were also generated.  COMPARISON:  Head CT on 12/24/2013 and cervical spine CT on 01/24/2013  FINDINGS: CT HEAD FINDINGS  There is no evidence of intracranial hemorrhage, brain edema, or other signs of acute infarction. There is no evidence of intracranial mass lesion or mass effect. No abnormal extraaxial fluid collections are identified.  Old left thalamic lacunar infarct again noted. Mild chronic small vessel disease again demonstrated. No evidence hydrocephalus.  Moderate left frontal scalp hematoma is seen. No evidence of skull fracture or pneumocephalus.  CT MAXILLOFACIAL FINDINGS  No evidence of acute fracture involving the facial bones or orbits. Mild left periorbital soft tissue hematoma is noted. Globes and other intraorbital anatomy are normal in appearance. No evidence of orbital emphysema or sinus air-fluid levels. No other significant bone abnormality identified.  CT CERVICAL SPINE FINDINGS  No evidence of acute fracture, subluxation, or prevertebral soft tissue swelling.  Mild to moderate degenerative disc disease is seen at C3-4 and C5-6. Moderate bilateral facet DJD is seen at most cervical levels. Atlantoaxial degenerative changes are also noted.  IMPRESSION: Left frontal scalp and periorbital soft tissue hematoma. No evidence of skull fracture or acute intracranial abnormality.  Mild chronic small vessel disease and old left thalamic lacunar infarct.  No evidence of orbital or facial bone fracture.  No evidence of cervical spine fracture or subluxation. Degenerative spondylosis, as described above.   Electronically Signed   By: Earle Gell M.D.   On: 01/10/2015 08:18     CBC  Recent Labs Lab 01/10/15 0722  WBC 9.2  HGB 13.9  HCT 41.3  PLT 177  MCV 96.7  MCH 32.6  MCHC 33.7  RDW 12.9    Chemistries   Recent Labs Lab 01/10/15 0722  NA 139  K 3.6  CL 105  CO2 26  GLUCOSE 180*  BUN 8  CREATININE 0.67  CALCIUM 8.9    ------------------------------------------------------------------------------------------------------------------ estimated creatinine clearance is 49 mL/min (by C-G formula based on Cr of 0.67). ------------------------------------------------------------------------------------------------------------------ No results for input(s): HGBA1C in the last 72 hours. ------------------------------------------------------------------------------------------------------------------ No results for input(s): CHOL, HDL, LDLCALC, TRIG, CHOLHDL, LDLDIRECT in the last 72 hours. ------------------------------------------------------------------------------------------------------------------ No results for input(s): TSH, T4TOTAL, T3FREE, THYROIDAB in the last 72 hours.  Invalid input(s): FREET3 ------------------------------------------------------------------------------------------------------------------ No results for input(s): VITAMINB12, FOLATE, FERRITIN, TIBC, IRON, RETICCTPCT in the last 72 hours.  Coagulation profile No results for input(s): INR, PROTIME in the last 168 hours.  No results for input(s): DDIMER in the last 72 hours.  Cardiac Enzymes  Recent Labs Lab 01/10/15 1110 01/10/15 1545 01/10/15 2247  TROPONINI <0.03 <0.03 <0.03   ------------------------------------------------------------------------------------------------------------------ Invalid input(s): POCBNP     Time Spent in minutes   30 minutes   Elmus Mathes M.D on 01/11/2015 at 3:29 PM  Between 7am to 7pm - Pager - [phone]  After 7pm go to [email or web address] - password [facility]  [facility]   Office  [phone]

## 2015-01-12 DIAGNOSIS — R55 Syncope and collapse: Secondary | ICD-10-CM | POA: Diagnosis not present

## 2015-01-12 LAB — CBC
HCT: 40.4 % (ref 36.0–46.0)
Hemoglobin: 13.4 g/dL (ref 12.0–15.0)
MCH: 32.5 pg (ref 26.0–34.0)
MCHC: 33.2 g/dL (ref 30.0–36.0)
MCV: 98.1 fL (ref 78.0–100.0)
Platelets: 160 10*3/uL (ref 150–400)
RBC: 4.12 MIL/uL (ref 3.87–5.11)
RDW: 13.1 % (ref 11.5–15.5)
WBC: 5 10*3/uL (ref 4.0–10.5)

## 2015-01-12 LAB — PHOSPHORUS: Phosphorus: 2.1 mg/dL — ABNORMAL LOW (ref 2.5–4.6)

## 2015-01-12 LAB — GLUCOSE, CAPILLARY: Glucose-Capillary: 98 mg/dL (ref 65–99)

## 2015-01-12 LAB — BASIC METABOLIC PANEL
Anion gap: 9 (ref 5–15)
BUN: 5 mg/dL — ABNORMAL LOW (ref 6–20)
CO2: 24 mmol/L (ref 22–32)
Calcium: 8.4 mg/dL — ABNORMAL LOW (ref 8.9–10.3)
Chloride: 106 mmol/L (ref 101–111)
Creatinine, Ser: 0.58 mg/dL (ref 0.44–1.00)
GFR calc Af Amer: >60 mL/min (ref >60)
GFR calc non Af Amer: >60 mL/min (ref >60)
Glucose, Bld: 97 mg/dL (ref 65–99)
Potassium: 3.2 mmol/L — ABNORMAL LOW (ref 3.5–5.1)
Sodium: 139 mmol/L (ref 135–145)

## 2015-01-12 LAB — MAGNESIUM: Magnesium: 1.9 mg/dL (ref 1.7–2.4)

## 2015-01-12 LAB — HEMOGLOBIN A1C
Hgb A1c MFr Bld: 6 % — ABNORMAL HIGH (ref 4.8–5.6)
Mean Plasma Glucose: 126 mg/dL

## 2015-01-12 MED ORDER — ENSURE ENLIVE PO LIQD
237.0000 mL | Freq: Two times a day (BID) | ORAL | Status: DC
  Administered 2015-01-12 – 2015-01-13 (×4): 237 mL via ORAL

## 2015-01-12 MED ORDER — POTASSIUM PHOSPHATES 15 MMOLE/5ML IV SOLN
20.0000 mmol | Freq: Once | INTRAVENOUS | Status: AC
  Administered 2015-01-12: 20 mmol via INTRAVENOUS
  Filled 2015-01-12: qty 6.67

## 2015-01-12 MED ORDER — POTASSIUM CHLORIDE CRYS ER 20 MEQ PO TBCR
40.0000 meq | EXTENDED_RELEASE_TABLET | ORAL | Status: AC
  Administered 2015-01-12 (×2): 40 meq via ORAL
  Filled 2015-01-12 (×2): qty 2

## 2015-01-12 MED ORDER — LEVOFLOXACIN IN D5W 750 MG/150ML IV SOLN
750.0000 mg | INTRAVENOUS | Status: DC
  Administered 2015-01-13: 750 mg via INTRAVENOUS
  Filled 2015-01-12: qty 150

## 2015-01-12 NOTE — Clinical Documentation Improvement (Signed)
Hospitalist  Please clarify if the following diagnosis, "Pneumonia" was:   Present on Admission (POA)  NOT present at the time of admission and it developed during the inpatient stay  Unable to clinically determine whether the condition was present on admission.  Unknown   Supporting Information: "Early community-acquired pneumonia - X-ray showing evolving left lung opacity, start on IV levofloxacin" is documented in the progress note dated 01/11/15.   Thank You,  Erling Conte RN BSN CCDS [phone] Health Information Management [place]

## 2015-01-12 NOTE — Clinical Social Work Placement (Signed)
   CLINICAL SOCIAL WORK PLACEMENT  NOTE  Date:  01/12/2015  Patient Details  Name: Beth Foster MRN: [id number] Date of Birth: [birth year]  Clinical Social Work is seeking post-discharge placement for this patient at the [place] level of care (*CSW will initial, date and re-position this form in  chart as items are completed):  Yes   Patient/family provided with [facility] Work Department's list of facilities offering this level of care within the geographic area requested by the patient (or if unable, by the patient's family).  Yes   Patient/family informed of their freedom to choose among providers that offer the needed level of care, that participate in Medicare, Medicaid or managed care program needed by the patient, have an available bed and are willing to accept the patient.  Yes   Patient/family informed of [facility]'s ownership interest in [facility] and [facility], as well as of the fact that they are under no obligation to receive care at these facilities.  PASRR submitted to EDS on 01/12/15     PASRR number received on 01/12/15     Existing PASRR number confirmed on       FL2 transmitted to all facilities in geographic area requested by pt/family on 01/12/15     FL2 transmitted to all facilities within larger geographic area on       Patient informed that his/her managed care company has contracts with or will negotiate with certain facilities, including the following:            Patient/family informed of bed offers received.  Patient chooses bed at       Physician recommends and patient chooses bed at      Patient to be transferred to   on  .  Patient to be transferred to facility by       Patient family notified on   of transfer.  Name of family member notified:        PHYSICIAN       Additional Comment:    _______________________________________________ Liz Beach MSW, [place], [place], [phone]

## 2015-01-12 NOTE — Evaluation (Signed)
Physical Therapy Evaluation Patient Details Name: Beth Foster MRN: [id number] DOB: [birth year] Today's Date: 01/12/2015   History of Present Illness  Patient is a 81 y/o female s/p fall at home. X-ray showing evolving left lung opacity. CT cervical spine-Left frontal scalp and periorbital soft tissue hematoma. PMH includes HTN, breast ca, stroke.  Clinical Impression  Patient presents with pain, weakness and balance deficits s/p fall impacting mobility. Pt with guarded movements during mobility assessment due to soreness. Required use of RW for support. Pt independent PTA and using SPC for ambulation. Very supportive grand daughter. Pt not safe to return home at this time. Would benefit from ST SNF to maximize independence and mobility with eventual transition into independent living facility.     Follow Up Recommendations SNF;Supervision/Assistance - 24 hour    Equipment Recommendations  None recommended by PT    Recommendations for Other Services OT consult     Precautions / Restrictions Precautions Precautions: Fall Restrictions Weight Bearing Restrictions: No      Mobility  Bed Mobility Overal bed mobility: Needs Assistance Bed Mobility: Supine to Sit;Sit to Supine     Supine to sit: Supervision;HOB elevated Sit to supine: Supervision;HOB elevated   General bed mobility comments: Supervision for safety. Guarded movements secondary to soreness.  Transfers Overall transfer level: Needs assistance Equipment used: None Transfers: Sit to/from Stand Sit to Stand: Min guard         General transfer comment: Min guard for safety.   Ambulation/Gait Ambulation/Gait assistance: Min assist Ambulation Distance (Feet): 20 Feet (+120') Assistive device: Rolling walker (2 wheeled);None Gait Pattern/deviations: Step-through pattern;Decreased stride length;Trunk flexed   Gait velocity interpretation: <1.8 ft/sec, indicative of risk for recurrent falls General Gait Details:  Slow, guarded gait. Furniture walking within room with 1 LOB posteriorly when exiting bathroom. use of RW in hallway for support. Dyspnea present.   Stairs            Wheelchair Mobility    Modified Rankin (Stroke Patients Only)       Balance Overall balance assessment: Needs assistance Sitting-balance support: Feet supported;No upper extremity supported Sitting balance-Leahy Scale: Fair     Standing balance support: During functional activity Standing balance-Leahy Scale: Fair                               Pertinent Vitals/Pain Pain Assessment: Faces Faces Pain Scale: Hurts little more Pain Location: left ribs, left hip Pain Descriptors / Indicators: Sore;Aching Pain Intervention(s): Monitored during session;Repositioned    Home Living Family/patient expects to be discharged to:: Skilled nursing facility Living Arrangements: Alone Available Help at Discharge: Family;Available PRN/intermittently Type of Home: House         Home Equipment: Walker - 4 wheels;Toilet riser;Cane - single point      Prior Function Level of Independence: Independent with assistive device(s)         Comments: Pt using SPC PTA. Does not drive. [place] daughter assists with grcoery shopping.     Hand Dominance        Extremity/Trunk Assessment   Upper Extremity Assessment: Defer to OT evaluation           Lower Extremity Assessment: Generalized weakness         Communication   Communication: No difficulties  Cognition Arousal/Alertness: Awake/alert Behavior During Therapy: WFL for tasks assessed/performed Overall Cognitive Status: Within Functional Limits for tasks assessed  General Comments General comments (skin integrity, edema, etc.): Grand daughter present inr oom during session. Lengthy discussion re: mobility, disposition, rehab details, healing etc.     Exercises        Assessment/Plan    PT Assessment  Patient needs continued PT services  PT Diagnosis Difficulty walking;Generalized weakness;Acute pain   PT Problem List Decreased strength;Pain;Cardiopulmonary status limiting activity;Decreased activity tolerance;Decreased balance;Decreased mobility  PT Treatment Interventions Balance training;Gait training;Functional mobility training;Therapeutic activities;Therapeutic exercise;Patient/family education   PT Goals (Current goals can be found in the Care Plan section) Acute Rehab PT Goals Patient Stated Goal: to go to rehab and then home PT Goal Formulation: With patient/family Time For Goal Achievement: 01/26/15 Potential to Achieve Goals: Fair    Frequency Min 2X/week   Barriers to discharge Decreased caregiver support      Co-evaluation               End of Session Equipment Utilized During Treatment: Gait belt Activity Tolerance: Patient tolerated treatment well Patient left: in bed;with call bell/phone within reach;with bed alarm set;with family/visitor present Nurse Communication: Mobility status    Functional Assessment Tool Used: Clinical judgment Functional Limitation: Mobility: Walking and moving around Mobility: Walking and Moving Around Current Status (U9323): At least 20 percent but less than 40 percent impaired, limited or restricted Mobility: Walking and Moving Around Goal Status [phone]): At least 1 percent but less than 20 percent impaired, limited or restricted    Time: 2025-4270 PT Time Calculation (min) (ACUTE ONLY): 36 min   Charges:   PT Evaluation $Initial PT Evaluation Tier I: 1 Procedure PT Treatments $Therapeutic Activity: 8-22 mins   PT G Codes:   PT G-Codes **NOT FOR INPATIENT CLASS** Functional Assessment Tool Used: Clinical judgment Functional Limitation: Mobility: Walking and moving around Mobility: Walking and Moving Around Current Status (W2376): At least 20 percent but less than 40 percent impaired, limited or restricted Mobility:  Walking and Moving Around Goal Status [phone]): At least 1 percent but less than 20 percent impaired, limited or restricted    [place] 01/12/2015, 3:43 PM Wray Kearns, [place], DPT [phone]

## 2015-01-12 NOTE — Clinical Social Work Note (Addendum)
Clinical Social Work Assessment  Patient Details  Name: Beth Foster MRN: [id number] Date of Birth: 18-Jan-1929  Date of referral:  01/12/15               Reason for consult:  Facility Placement, Discharge Planning                Permission sought to share information with:  Facility Sport and exercise psychologist, Family Supports Permission granted to share information::  Yes, Verbal Permission Granted  Name::     Public librarian::  SNFs  Relationship::     Contact Information:     Housing/Transportation Living arrangements for the past 2 months:  [place] (Granddaughter Vicente Males) Source of Information:  Other (Comment Required) (Granddaughter Vicente Males) Patient Interpreter Needed:  None Criminal Activity/Legal Involvement Pertinent to Current Situation/Hospitalization:  No - Comment as needed Significant Relationships:  Other Family Members Lives with:  Self Do you feel safe going back to the place where you live?  Yes Need for family participation in patient care:  Yes (Comment)  Care giving concerns:  Patients granddaughter agrees that the patient needs continued rehab at discharge, however she states she was led to believe this would be at CIR not a SNF.   Social Worker assessment / plan:  CSW met with patient and granddaughter Vicente Males at bedside to complete assessment. Vicente Males states that she does not want her grandmother going to a "nursing home". CSW explained that many patients go to SNFs for short term rehab and transition back home when able. She adamantly states that she wants the patient to go to CIR or she will take the patient home. CSW explained that it is unlikely the patient will be a candidate for CIR as this was not recommended for the patient and the patient is observation status. CSW explained that at this point the patient's options would be to go to SNF or go home with 24 hr caregivers. CSW has notified RNCM of Anna's frustration with change in patient status  (inpatient>observation). Vicente Males is agreeable to SNF search at this time but not sure if she will utilize this option. CSW explained that patient will likely discharge tomorrow so Vicente Males will have to make a quick decision. Anna verbalizes understanding. CSW will followup with bed offers.   Employment status:  Disabled (Comment on whether or not currently receiving Disability) Insurance information:  Managed Medicare PT Recommendations:  [place] / Referral to community resources:  [place]  Patient/Family's Response to care:  Patient's granddaughter appears frustrated about the patient being changed to observation status. She states she will appeal the patient's discharge if she is not happy with the discharge options. CSW explained that this would not be a good utilization of the appeal process as this should only be used if she feels the patient has a medical need that requires a continued hospital stay.  Patient/Family's Understanding of and Emotional Response to Diagnosis, Current Treatment, and Prognosis:  Patient's family appears to have good understanding of post DC needs and reason for admission. At this time patient will go to SNF or go home at DC.   Emotional Assessment Appearance:  Appears stated age Attitude/Demeanor/Rapport:  Other (Appropriate) Affect (typically observed):  Accepting, Appropriate, Calm, Pleasant Orientation:  Oriented to Self, Oriented to Place, Oriented to  Time, Oriented to Situation Alcohol / Substance use:  Never Used Psych involvement (Current and /or in the community):  No (Comment)  Discharge Needs  Concerns to be  addressed:  Discharge Planning Concerns Readmission within the last 30 days:  No Current discharge risk:  Chronically ill, Physical Impairment Barriers to Discharge:  Continued Medical Work up   [employer] MSW, [place], [place], [phone]

## 2015-01-12 NOTE — Progress Notes (Signed)
Patient taken to bathrom family not at bedside at present time.

## 2015-01-12 NOTE — Progress Notes (Signed)
Patient Demographics  Beth Foster, is a 81 y.o. female, DOB - [birth year], CWC:[id number]  Admit date - 01/10/2015   Admitting Physician Kelvin Cellar, MD  Outpatient Primary MD for the patient is REED, TIFFANY, DO  LOS - 2   Chief Complaint  Patient presents with  . Fall  . Loss of Consciousness         Subjective:   Beth Foster today has,  No chest pain, No abdominal pain - No Nausea, No new weakness tingling or numbness, denies shortness of breath, cough significantly improved, headache resolved.  Assessment & Plan    Principal Problem:   Syncope and collapse Active Problems:   Mild cognitive impairment with memory loss   GERD (gastroesophageal reflux disease)   HLD (hyperlipidemia)   Essential hypertension, benign   Vitamin D deficiency   Contusion of left supra orbit   Laceration of right eyebrow   DOE (dyspnea on exertion)   Exertional chest pain   Fatigue   Acute hyperglycemia  Syncope and collapse  - Most likely related due to dehydration and infectious process  - Negative urinalysis , chest x-ray for possible early pneumonia in left lung . - continue to monitor on telemetry (no events on telemetry), -  continue with IV fluid . Continue to treat CAP. - 2-D echo EF 50-55%, no regional wall motion abnormality , grade 1 diastolic dysfunction .  Early community-acquired pneumonia present on admission - X-ray showing evolving left lung opacity,  on IV levofloxacin.   hydration and weakness -  Continue with IV fluids - PT consult   vitamin D deficiency - Continue with supplements  Hyperglycemia - fglycohemoglobin is 6  Hypertension - Continue with diltiazem  Code Status: full  Family Communication: daughter at bedside   Disposition Plan: pending PT evaluation   Procedures  None   Consults   None    Medications  Scheduled Meds: . acidophilus  2  capsule Oral Daily  . cholecalciferol  1,000 Units Oral QPC lunch  . diltiazem  180 mg Oral QHS  . docusate sodium  100 mg Oral BID  . enoxaparin (LOVENOX) injection  40 mg Subcutaneous Q24H  . feeding supplement (ENSURE ENLIVE)  237 mL Oral BID BM  . [START ON 01/13/2015] levofloxacin (LEVAQUIN) IV  750 mg Intravenous Q48H  . multivitamin with minerals  1 tablet Oral Daily  . potassium phosphate IVPB (mmol)  20 mmol Intravenous Once  . sodium chloride  3 mL Intravenous Q12H   Continuous Infusions: . sodium chloride 75 mL/hr at 01/12/15 0435   PRN Meds:.acetaminophen **OR** acetaminophen, alum & mag hydroxide-simeth, magnesium citrate, ondansetron **OR** ondansetron (ZOFRAN) IV, oxyCODONE, polyethylene glycol, sorbitol  DVT Prophylaxis  Lovenox -  Lab Results  Component Value Date   PLT 160 01/12/2015    Antibiotics    Anti-infectives    Start     Dose/Rate Route Frequency Ordered Stop   01/13/15 1200  levofloxacin (LEVAQUIN) IVPB 750 mg  Status:  Discontinued     750 mg 100 mL/hr over 90 Minutes Intravenous Every 24 hours 01/11/15 1520 01/11/15 1522   01/13/15 1000  levofloxacin (LEVAQUIN) IVPB 750 mg     750 mg 100 mL/hr over 90 Minutes Intravenous Every 48 hours  01/12/15 1017     01/12/15 1000  levofloxacin (LEVAQUIN) IVPB 750 mg  Status:  Discontinued     750 mg 100 mL/hr over 90 Minutes Intravenous Every 48 hours 01/11/15 1529 01/12/15 1017   01/11/15 1530  levofloxacin (LEVAQUIN) IVPB 750 mg  Status:  Discontinued     750 mg 100 mL/hr over 90 Minutes Intravenous Every 48 hours 01/11/15 1522 01/11/15 1529   01/11/15 1000  levofloxacin (LEVAQUIN) tablet 750 mg  Status:  Discontinued     750 mg Oral Every 48 hours 01/11/15 0942 01/11/15 1520          Objective:   Filed Vitals:   01/12/15 0103 01/12/15 0434 01/12/15 0600 01/12/15 1023  BP: 150/73  160/74 156/70  Pulse: 85  89 82  Temp: 98.2 F (36.8 C)  97.7 F (36.5 C) 97.6 F (36.4 C)  TempSrc: Oral  Oral  Oral  Resp: 18  18 20   Height:      Weight:  69.6 kg (153 lb 7 oz)    SpO2: 95%  91% 96%    Wt Readings from Last 3 Encounters:  01/12/15 69.6 kg (153 lb 7 oz)  11/28/14 67.405 kg (148 lb 9.6 oz)  07/24/14 67.314 kg (148 lb 6.4 oz)     Intake/Output Summary (Last 24 hours) at 01/12/15 1439 Last data filed at 01/12/15 0946  Gross per 24 hour  Intake 716.25 ml  Output      0 ml  Net 716.25 ml     Physical Exam  Awake Alert, Oriented X 3, No new F.N deficits, Normal affect Left supraorbital/forehead hematoma/contusion, right eyebrow laceration status post repair. Supple Neck,No JVD, No cervical lymphadenopathy appriciated.  Symmetrical Chest wall movement, Good air movement bilaterally,  RRR,No Gallops,Rubs or new Murmurs, No Parasternal Heave +ve B.Sounds, Abd Soft, No tenderness, No organomegaly appriciated, No rebound - guarding or rigidity. No Cyanosis, Clubbing or edema, No new Rash or bruise     Data Review   Micro Results No results found for this or any previous visit (from the past 240 hour(s)).  Radiology Reports Dg Chest 2 View  01/10/2015   CLINICAL DATA:  Syncope  EXAM: CHEST  2 VIEW  COMPARISON:  None.  FINDINGS: Upper normal heart size. Minimal patchy density at the lateral left base. Lungs are otherwise clear. No pneumothorax. No pleural effusion.  IMPRESSION: Minimal patchy atelectasis versus airspace disease at the lateral left base.   Electronically Signed   By: Marybelle Killings M.D.   On: 01/10/2015 12:58   Ct Head Wo Contrast  01/10/2015   CLINICAL DATA:  Fall in bathroom. Headache and scalp laceration. Neck pain. Hypertension. Previous stroke.  EXAM: CT HEAD WITHOUT CONTRAST  CT MAXILLOFACIAL WITHOUT CONTRAST  CT CERVICAL SPINE WITHOUT CONTRAST  TECHNIQUE: Multidetector CT imaging of the head, cervical spine, and maxillofacial structures were performed using the standard protocol without intravenous contrast. Multiplanar CT image reconstructions of the  cervical spine and maxillofacial structures were also generated.  COMPARISON:  Head CT on 12/24/2013 and cervical spine CT on 01/24/2013  FINDINGS: CT HEAD FINDINGS  There is no evidence of intracranial hemorrhage, brain edema, or other signs of acute infarction. There is no evidence of intracranial mass lesion or mass effect. No abnormal extraaxial fluid collections are identified.  Old left thalamic lacunar infarct again noted. Mild chronic small vessel disease again demonstrated. No evidence hydrocephalus.  Moderate left frontal scalp hematoma is seen. No evidence of skull fracture or pneumocephalus.  CT MAXILLOFACIAL FINDINGS  No evidence of acute fracture involving the facial bones or orbits. Mild left periorbital soft tissue hematoma is noted. Globes and other intraorbital anatomy are normal in appearance. No evidence of orbital emphysema or sinus air-fluid levels. No other significant bone abnormality identified.  CT CERVICAL SPINE FINDINGS  No evidence of acute fracture, subluxation, or prevertebral soft tissue swelling.  Mild to moderate degenerative disc disease is seen at C3-4 and C5-6. Moderate bilateral facet DJD is seen at most cervical levels. Atlantoaxial degenerative changes are also noted.  IMPRESSION: Left frontal scalp and periorbital soft tissue hematoma. No evidence of skull fracture or acute intracranial abnormality.  Mild chronic small vessel disease and old left thalamic lacunar infarct.  No evidence of orbital or facial bone fracture.  No evidence of cervical spine fracture or subluxation. Degenerative spondylosis, as described above.   Electronically Signed   By: Earle Gell M.D.   On: 01/10/2015 08:18   Ct Cervical Spine Wo Contrast  01/10/2015   CLINICAL DATA:  Fall in bathroom. Headache and scalp laceration. Neck pain. Hypertension. Previous stroke.  EXAM: CT HEAD WITHOUT CONTRAST  CT MAXILLOFACIAL WITHOUT CONTRAST  CT CERVICAL SPINE WITHOUT CONTRAST  TECHNIQUE: Multidetector CT  imaging of the head, cervical spine, and maxillofacial structures were performed using the standard protocol without intravenous contrast. Multiplanar CT image reconstructions of the cervical spine and maxillofacial structures were also generated.  COMPARISON:  Head CT on 12/24/2013 and cervical spine CT on 01/24/2013  FINDINGS: CT HEAD FINDINGS  There is no evidence of intracranial hemorrhage, brain edema, or other signs of acute infarction. There is no evidence of intracranial mass lesion or mass effect. No abnormal extraaxial fluid collections are identified.  Old left thalamic lacunar infarct again noted. Mild chronic small vessel disease again demonstrated. No evidence hydrocephalus.  Moderate left frontal scalp hematoma is seen. No evidence of skull fracture or pneumocephalus.  CT MAXILLOFACIAL FINDINGS  No evidence of acute fracture involving the facial bones or orbits. Mild left periorbital soft tissue hematoma is noted. Globes and other intraorbital anatomy are normal in appearance. No evidence of orbital emphysema or sinus air-fluid levels. No other significant bone abnormality identified.  CT CERVICAL SPINE FINDINGS  No evidence of acute fracture, subluxation, or prevertebral soft tissue swelling.  Mild to moderate degenerative disc disease is seen at C3-4 and C5-6. Moderate bilateral facet DJD is seen at most cervical levels. Atlantoaxial degenerative changes are also noted.  IMPRESSION: Left frontal scalp and periorbital soft tissue hematoma. No evidence of skull fracture or acute intracranial abnormality.  Mild chronic small vessel disease and old left thalamic lacunar infarct.  No evidence of orbital or facial bone fracture.  No evidence of cervical spine fracture or subluxation. Degenerative spondylosis, as described above.   Electronically Signed   By: Earle Gell M.D.   On: 01/10/2015 08:18   Ct Maxillofacial Wo Cm  01/10/2015   CLINICAL DATA:  Fall in bathroom. Headache and scalp laceration.  Neck pain. Hypertension. Previous stroke.  EXAM: CT HEAD WITHOUT CONTRAST  CT MAXILLOFACIAL WITHOUT CONTRAST  CT CERVICAL SPINE WITHOUT CONTRAST  TECHNIQUE: Multidetector CT imaging of the head, cervical spine, and maxillofacial structures were performed using the standard protocol without intravenous contrast. Multiplanar CT image reconstructions of the cervical spine and maxillofacial structures were also generated.  COMPARISON:  Head CT on 12/24/2013 and cervical spine CT on 01/24/2013  FINDINGS: CT HEAD FINDINGS  There is no evidence of intracranial hemorrhage, brain edema, or  other signs of acute infarction. There is no evidence of intracranial mass lesion or mass effect. No abnormal extraaxial fluid collections are identified.  Old left thalamic lacunar infarct again noted. Mild chronic small vessel disease again demonstrated. No evidence hydrocephalus.  Moderate left frontal scalp hematoma is seen. No evidence of skull fracture or pneumocephalus.  CT MAXILLOFACIAL FINDINGS  No evidence of acute fracture involving the facial bones or orbits. Mild left periorbital soft tissue hematoma is noted. Globes and other intraorbital anatomy are normal in appearance. No evidence of orbital emphysema or sinus air-fluid levels. No other significant bone abnormality identified.  CT CERVICAL SPINE FINDINGS  No evidence of acute fracture, subluxation, or prevertebral soft tissue swelling.  Mild to moderate degenerative disc disease is seen at C3-4 and C5-6. Moderate bilateral facet DJD is seen at most cervical levels. Atlantoaxial degenerative changes are also noted.  IMPRESSION: Left frontal scalp and periorbital soft tissue hematoma. No evidence of skull fracture or acute intracranial abnormality.  Mild chronic small vessel disease and old left thalamic lacunar infarct.  No evidence of orbital or facial bone fracture.  No evidence of cervical spine fracture or subluxation. Degenerative spondylosis, as described above.    Electronically Signed   By: Earle Gell M.D.   On: 01/10/2015 08:18     CBC  Recent Labs Lab 01/10/15 0722 01/12/15 0536  WBC 9.2 5.0  HGB 13.9 13.4  HCT 41.3 40.4  PLT 177 160  MCV 96.7 98.1  MCH 32.6 32.5  MCHC 33.7 33.2  RDW 12.9 13.1    Chemistries   Recent Labs Lab 01/10/15 0722 01/12/15 0536  NA 139 139  K 3.6 3.2*  CL 105 106  CO2 26 24  GLUCOSE 180* 97  BUN 8 5*  CREATININE 0.67 0.58  CALCIUM 8.9 8.4*  MG  --  1.9   ------------------------------------------------------------------------------------------------------------------ estimated creatinine clearance is 49 mL/min (by C-G formula based on Cr of 0.58). ------------------------------------------------------------------------------------------------------------------  Recent Labs  01/10/15 1111  HGBA1C 6.0*   ------------------------------------------------------------------------------------------------------------------ No results for input(s): CHOL, HDL, LDLCALC, TRIG, CHOLHDL, LDLDIRECT in the last 72 hours. ------------------------------------------------------------------------------------------------------------------ No results for input(s): TSH, T4TOTAL, T3FREE, THYROIDAB in the last 72 hours.  Invalid input(s): FREET3 ------------------------------------------------------------------------------------------------------------------ No results for input(s): VITAMINB12, FOLATE, FERRITIN, TIBC, IRON, RETICCTPCT in the last 72 hours.  Coagulation profile No results for input(s): INR, PROTIME in the last 168 hours.  No results for input(s): DDIMER in the last 72 hours.  Cardiac Enzymes  Recent Labs Lab 01/10/15 1110 01/10/15 1545 01/10/15 2247  TROPONINI <0.03 <0.03 <0.03   ------------------------------------------------------------------------------------------------------------------ Invalid input(s): POCBNP     Time Spent in minutes   30 minutes   Johnothan Bascomb M.D  on 01/12/2015 at 2:39 PM  Between 7am to 7pm - Pager - [phone]  After 7pm go to [email or web address] - password [facility]  [facility]   Office  [phone]

## 2015-01-12 NOTE — Care Management Note (Addendum)
Case Management Note  Patient Details  Name: Beth Foster MRN: [id number] Date of Birth: [birth year]  Subjective/Objective:                 Spoke with patient and her granddaughter Beth Foster who states that she is her POA. Beth Foster states that her grandmother will be coming home with her and her husband from the hospital if she does not go to a SNF.  Beth Foster's home address: [address] [place] [address]. Beth Foster's [phone]. Patient currently lives at home by herself and has had 2 falls in the past 3 years per Beth Foster.  Patient's granddaughter Beth Foster would like to have a bed on the first level of her home to avoid stairs, denies needing any other DME.   Spoke with Jermaine from [facility], he will come to patient's room and speak with patient and grandaughter about costs of renting or buying a bed, as the patient does not have a qualifying diagnosis. Granddaughter given pamphlet from [facility] [employer] to assist in ALF placement if needed after discharge.    Action/Plan:  Will continue to follow and offer resources and [facility] as needed.  Expected Discharge Date:                  Expected Discharge Plan:  [place]  In-House Referral:     Discharge planning Services  CM Consult  Post Acute Care Choice:    Choice offered to:  Patient, [facility] POA / Guardian  DME Arranged:    DME Agency:     HH Arranged:    [place] Agency:     Status of Service:  In process, will continue to follow  Medicare Important Message Given:    Date Medicare IM Given:    Medicare IM give by:    Date Additional Medicare IM Given:    Additional Medicare Important Message give by:     If discussed at [place] of Stay Meetings, dates discussed:    Additional Comments:  Carles Collet, RN 01/12/2015, 11:27 AM

## 2015-01-12 NOTE — Progress Notes (Signed)
Reviewed Code 44 papers with patient. Questions answered by CM and patient signed. Original kept by patient and copy given to Tina Stutts Administrative Assistant for Case Management. Care Management form signed and placed in shadow chart. 

## 2015-01-13 ENCOUNTER — Observation Stay (HOSPITAL_COMMUNITY): Payer: Medicare Other

## 2015-01-13 DIAGNOSIS — I10 Essential (primary) hypertension: Secondary | ICD-10-CM | POA: Diagnosis not present

## 2015-01-13 DIAGNOSIS — R269 Unspecified abnormalities of gait and mobility: Secondary | ICD-10-CM | POA: Diagnosis not present

## 2015-01-13 DIAGNOSIS — R0600 Dyspnea, unspecified: Secondary | ICD-10-CM | POA: Diagnosis not present

## 2015-01-13 DIAGNOSIS — W19XXXD Unspecified fall, subsequent encounter: Secondary | ICD-10-CM | POA: Diagnosis not present

## 2015-01-13 DIAGNOSIS — M542 Cervicalgia: Secondary | ICD-10-CM | POA: Diagnosis not present

## 2015-01-13 DIAGNOSIS — R110 Nausea: Secondary | ICD-10-CM | POA: Diagnosis not present

## 2015-01-13 DIAGNOSIS — S01111A Laceration without foreign body of right eyelid and periocular area, initial encounter: Secondary | ICD-10-CM | POA: Diagnosis not present

## 2015-01-13 DIAGNOSIS — Z9181 History of falling: Secondary | ICD-10-CM | POA: Diagnosis not present

## 2015-01-13 DIAGNOSIS — Z8673 Personal history of transient ischemic attack (TIA), and cerebral infarction without residual deficits: Secondary | ICD-10-CM | POA: Diagnosis not present

## 2015-01-13 DIAGNOSIS — R488 Other symbolic dysfunctions: Secondary | ICD-10-CM | POA: Diagnosis not present

## 2015-01-13 DIAGNOSIS — R05 Cough: Secondary | ICD-10-CM | POA: Diagnosis not present

## 2015-01-13 DIAGNOSIS — R634 Abnormal weight loss: Secondary | ICD-10-CM | POA: Diagnosis not present

## 2015-01-13 DIAGNOSIS — S299XXA Unspecified injury of thorax, initial encounter: Secondary | ICD-10-CM | POA: Diagnosis not present

## 2015-01-13 DIAGNOSIS — M6281 Muscle weakness (generalized): Secondary | ICD-10-CM | POA: Diagnosis not present

## 2015-01-13 DIAGNOSIS — G8911 Acute pain due to trauma: Secondary | ICD-10-CM | POA: Diagnosis not present

## 2015-01-13 DIAGNOSIS — R278 Other lack of coordination: Secondary | ICD-10-CM | POA: Diagnosis not present

## 2015-01-13 DIAGNOSIS — R0789 Other chest pain: Secondary | ICD-10-CM | POA: Diagnosis not present

## 2015-01-13 DIAGNOSIS — E785 Hyperlipidemia, unspecified: Secondary | ICD-10-CM | POA: Diagnosis not present

## 2015-01-13 DIAGNOSIS — S0181XD Laceration without foreign body of other part of head, subsequent encounter: Secondary | ICD-10-CM | POA: Diagnosis not present

## 2015-01-13 DIAGNOSIS — S0181XA Laceration without foreign body of other part of head, initial encounter: Secondary | ICD-10-CM | POA: Diagnosis not present

## 2015-01-13 DIAGNOSIS — M2550 Pain in unspecified joint: Secondary | ICD-10-CM | POA: Diagnosis not present

## 2015-01-13 DIAGNOSIS — K219 Gastro-esophageal reflux disease without esophagitis: Secondary | ICD-10-CM | POA: Diagnosis not present

## 2015-01-13 DIAGNOSIS — S0990XA Unspecified injury of head, initial encounter: Secondary | ICD-10-CM | POA: Diagnosis not present

## 2015-01-13 DIAGNOSIS — Z853 Personal history of malignant neoplasm of breast: Secondary | ICD-10-CM | POA: Diagnosis not present

## 2015-01-13 DIAGNOSIS — K5900 Constipation, unspecified: Secondary | ICD-10-CM | POA: Diagnosis not present

## 2015-01-13 DIAGNOSIS — E559 Vitamin D deficiency, unspecified: Secondary | ICD-10-CM | POA: Diagnosis not present

## 2015-01-13 DIAGNOSIS — R739 Hyperglycemia, unspecified: Secondary | ICD-10-CM | POA: Diagnosis not present

## 2015-01-13 DIAGNOSIS — G3184 Mild cognitive impairment, so stated: Secondary | ICD-10-CM | POA: Diagnosis not present

## 2015-01-13 DIAGNOSIS — R5383 Other fatigue: Secondary | ICD-10-CM | POA: Diagnosis not present

## 2015-01-13 DIAGNOSIS — R55 Syncope and collapse: Secondary | ICD-10-CM | POA: Diagnosis not present

## 2015-01-13 DIAGNOSIS — S0083XA Contusion of other part of head, initial encounter: Secondary | ICD-10-CM | POA: Diagnosis not present

## 2015-01-13 DIAGNOSIS — M1990 Unspecified osteoarthritis, unspecified site: Secondary | ICD-10-CM | POA: Diagnosis not present

## 2015-01-13 DIAGNOSIS — Z923 Personal history of irradiation: Secondary | ICD-10-CM | POA: Diagnosis not present

## 2015-01-13 DIAGNOSIS — S0083XD Contusion of other part of head, subsequent encounter: Secondary | ICD-10-CM | POA: Diagnosis not present

## 2015-01-13 DIAGNOSIS — T148 Other injury of unspecified body region: Secondary | ICD-10-CM | POA: Diagnosis not present

## 2015-01-13 DIAGNOSIS — D649 Anemia, unspecified: Secondary | ICD-10-CM | POA: Diagnosis not present

## 2015-01-13 DIAGNOSIS — N644 Mastodynia: Secondary | ICD-10-CM | POA: Diagnosis not present

## 2015-01-13 DIAGNOSIS — R609 Edema, unspecified: Secondary | ICD-10-CM | POA: Diagnosis not present

## 2015-01-13 DIAGNOSIS — J189 Pneumonia, unspecified organism: Secondary | ICD-10-CM | POA: Diagnosis not present

## 2015-01-13 DIAGNOSIS — R079 Chest pain, unspecified: Secondary | ICD-10-CM | POA: Diagnosis not present

## 2015-01-13 LAB — BASIC METABOLIC PANEL
Anion gap: 7 (ref 5–15)
BUN: <5 mg/dL — ABNORMAL LOW (ref 6–20)
CO2: 25 mmol/L (ref 22–32)
Calcium: 9.1 mg/dL (ref 8.9–10.3)
Chloride: 107 mmol/L (ref 101–111)
Creatinine, Ser: 0.54 mg/dL (ref 0.44–1.00)
GFR calc Af Amer: >60 mL/min (ref >60)
GFR calc non Af Amer: >60 mL/min (ref >60)
Glucose, Bld: 109 mg/dL — ABNORMAL HIGH (ref 65–99)
Potassium: 3.7 mmol/L (ref 3.5–5.1)
Sodium: 139 mmol/L (ref 135–145)

## 2015-01-13 LAB — GLUCOSE, CAPILLARY: Glucose-Capillary: 116 mg/dL — ABNORMAL HIGH (ref 65–99)

## 2015-01-13 MED ORDER — HYDRALAZINE HCL 25 MG PO TABS
25.0000 mg | ORAL_TABLET | Freq: Three times a day (TID) | ORAL | Status: DC
  Administered 2015-01-13 (×2): 25 mg via ORAL
  Filled 2015-01-13 (×2): qty 1

## 2015-01-13 MED ORDER — LEVOFLOXACIN 750 MG PO TABS: 750.0000 mg | ORAL_TABLET | Freq: Every day | ORAL | Status: AC

## 2015-01-13 MED ORDER — HYDROCODONE-ACETAMINOPHEN 7.5-325 MG/15ML PO SOLN: 15.0000 mL | Freq: Four times a day (QID) | ORAL | Status: DC | PRN

## 2015-01-13 MED ORDER — RISAQUAD PO CAPS: 2.0000 | ORAL_CAPSULE | Freq: Every day | ORAL | Status: AC

## 2015-01-13 MED ORDER — SORBITOL 70 % SOLN: 30.0000 mL | Freq: Every day | Status: DC | PRN

## 2015-01-13 MED ORDER — HYDRALAZINE HCL 20 MG/ML IJ SOLN
5.0000 mg | Freq: Four times a day (QID) | INTRAMUSCULAR | Status: DC | PRN
  Administered 2015-01-13: 5 mg via INTRAVENOUS
  Filled 2015-01-13: qty 1

## 2015-01-13 MED ORDER — POLYETHYLENE GLYCOL 3350 17 G PO PACK
17.0000 g | PACK | Freq: Once | ORAL | Status: AC
  Administered 2015-01-13: 17 g via ORAL
  Filled 2015-01-13: qty 1

## 2015-01-13 MED ORDER — ADULT MULTIVITAMIN W/MINERALS CH
1.0000 | ORAL_TABLET | Freq: Every day | ORAL | Status: DC
Start: 2015-01-13 — End: 2015-03-19

## 2015-01-13 MED ORDER — ONDANSETRON HCL 4 MG PO TABS: 4.0000 mg | ORAL_TABLET | Freq: Four times a day (QID) | ORAL | Status: DC | PRN

## 2015-01-13 MED ORDER — DOCUSATE SODIUM 100 MG PO CAPS
200.0000 mg | ORAL_CAPSULE | Freq: Once | ORAL | Status: AC
  Administered 2015-01-13: 200 mg via ORAL
  Filled 2015-01-13: qty 2

## 2015-01-13 MED ORDER — HYDRALAZINE HCL 25 MG PO TABS: 25.0000 mg | ORAL_TABLET | Freq: Three times a day (TID) | ORAL | Status: DC

## 2015-01-13 MED ORDER — POLYETHYLENE GLYCOL 3350 17 G PO PACK: 17.0000 g | PACK | Freq: Every day | ORAL | Status: DC | PRN

## 2015-01-13 NOTE — Clinical Social Work Placement (Signed)
   CLINICAL SOCIAL WORK PLACEMENT  NOTE  Date:  01/13/2015  Patient Details  Name: TYKIERA RAVEN MRN: [id number] Date of Birth: May 14, 1929  Clinical Social Work is seeking post-discharge placement for this patient at the [place] level of care (*CSW will initial, date and re-position this form in  chart as items are completed):  Yes   Patient/family provided with [place] Work Department's list of facilities offering this level of care within the geographic area requested by the patient (or if unable, by the patient's family).  Yes   Patient/family informed of their freedom to choose among providers that offer the needed level of care, that participate in Medicare, Medicaid or managed care program needed by the patient, have an available bed and are willing to accept the patient.  Yes   Patient/family informed of [facility]'s ownership interest in [facility] and [facility], as well as of the fact that they are under no obligation to receive care at these facilities.  PASRR submitted to EDS on 01/12/15     PASRR number received on 01/12/15     Existing PASRR number confirmed on       FL2 transmitted to all facilities in geographic area requested by pt/family on 01/12/15     FL2 transmitted to all facilities within larger geographic area on       Patient informed that his/her managed care company has contracts with or will negotiate with certain facilities, including the following:        Yes   Patient/family informed of bed offers received.  Patient chooses bed at [facility]     Physician recommends and patient chooses bed at      Patient to be transferred to [facility] on 01/13/15.  Patient to be transferred to facility by [facility]     Patient family notified on 01/13/15 of transfer.  Name of family member notified:  Patient granddaughter at bedside     PHYSICIAN       Additional Comment:   Barbette Or,  [place]

## 2015-01-13 NOTE — Progress Notes (Signed)
Gave report to [place], nurse at [facility]. All questions answered. Pt escorted by staff to vehicle via wheelchair. Pt transported to facility by car escorted by daughter (Amy).

## 2015-01-13 NOTE — Clinical Social Work Note (Signed)
Clinical Social Worker facilitated patient discharge including contacting patient family and facility to confirm patient discharge plans.  Clinical information faxed to facility and family agreeable with plan.  CSW arranged transport via patient family to [facility]  RN to call report prior to discharge.  Clinical Social Worker will sign off for now as social work intervention is no longer needed. Please consult [place] again if new need arises.  Barbette Or, [place]

## 2015-01-13 NOTE — Discharge Instructions (Signed)
Follow with Primary MD REED, TIFFANY, DO after discharge from SNF  Get CBC, CMP, 2 view Chest X ray checked  by Primary MD next visit.    Activity: As tolerated with Full fall precautions use walker/cane & assistance as needed   Disposition SNF   Diet: Heart Healthy  , with feeding assistance and aspiration precautions.  For Heart failure patients - Check your Weight same time everyday, if you gain over 2 pounds, or you develop in leg swelling, experience more shortness of breath or chest pain, call your Primary MD immediately. Follow Cardiac Low Salt Diet and 1.5 lit/day fluid restriction.   On your next visit with your primary care physician please Get Medicines reviewed and adjusted.   Please request your Prim.MD to go over all Hospital Tests and Procedure/Radiological results at the follow up, please get all Hospital records sent to your Prim MD by signing hospital release before you go home.   If you experience worsening of your admission symptoms, develop shortness of breath, life threatening emergency, suicidal or homicidal thoughts you must seek medical attention immediately by calling 911 or calling your MD immediately  if symptoms less severe.  You Must read complete instructions/literature along with all the possible adverse reactions/side effects for all the Medicines you take and that have been prescribed to you. Take any new Medicines after you have completely understood and accpet all the possible adverse reactions/side effects.   Do not drive, operating heavy machinery, perform activities at heights, swimming or participation in water activities or provide baby sitting services if your were admitted for syncope or siezures until you have seen by Primary MD or a Neurologist and advised to do so again.  Do not drive when taking Pain medications.    Do not take more than prescribed Pain, Sleep and Anxiety Medications  Special Instructions: If you have smoked or chewed  Tobacco  in the last 2 yrs please stop smoking, stop any regular Alcohol  and or any Recreational drug use.  Wear Seat belts while driving.   Please note  You were cared for by a hospitalist during your hospital stay. If you have any questions about your discharge medications or the care you received while you were in the hospital after you are discharged, you can call the unit and asked to speak with the hospitalist on call if the hospitalist that took care of you is not available. Once you are discharged, your primary care physician will handle any further medical issues. Please note that NO REFILLS for any discharge medications will be authorized once you are discharged, as it is imperative that you return to your primary care physician (or establish a relationship with a primary care physician if you do not have one) for your aftercare needs so that they can reassess your need for medications and monitor your lab values.

## 2015-01-13 NOTE — Progress Notes (Signed)
Attempted to call report to nurse at [facility]. Left a message on voicemail to call back, with phone number.

## 2015-01-13 NOTE — Progress Notes (Signed)
Spoke with patient's granddaughter and POA Vicente Males. She states that they plan to go to SNF [facility]) at discharge.

## 2015-01-13 NOTE — Discharge Summary (Signed)
Beth Foster, is a 81 y.o. female  DOB 07/11/1928  MRN [id number].  Admission date:  01/10/2015  Admitting Physician  Kelvin Cellar, MD  Discharge Date:  01/13/2015   Primary MD  Hollace Kinnier, DO  Recommendations for primary care physician for things to follow:  - Check CBC, BMP during next visit   Admission Diagnosis  Syncope and collapse [R55] Hematoma [T14.8] Head injury, initial encounter [S09.90XA] Laceration of face, initial encounter [S01.81XA] Syncope, unspecified syncope type [R55]   Discharge Diagnosis  Syncope and collapse [R55] Hematoma [T14.8] Head injury, initial encounter [S09.90XA] Laceration of face, initial encounter [S01.81XA] Syncope, unspecified syncope type [R55]    Principal Problem:   Syncope and collapse Active Problems:   Mild cognitive impairment with memory loss   GERD (gastroesophageal reflux disease)   HLD (hyperlipidemia)   Essential hypertension, benign   Vitamin D deficiency   Contusion of left supra orbit   Laceration of right eyebrow   DOE (dyspnea on exertion)   Exertional chest pain   Fatigue   Acute hyperglycemia      Past Medical History  Diagnosis Date  . GERD (gastroesophageal reflux disease)   . Hypertension   . Pain in joint, pelvic region and thigh   . Vitamin D deficiency   . Viral hepatitis A without mention of hepatic coma   . Anxiety state, unspecified   . Unspecified cataract   . Unspecified late effects of cerebrovascular disease   . External hemorrhoids without mention of complication   . Contact dermatitis and other eczema due to other chemical products   . Bunion   . Rash and other nonspecific skin eruption   . Edema   . Viral hepatitis A without mention of hepatic coma   . Malignant neoplasm of breast (female), unspecified site dx'd 1996    rt breast; xrt   . Stroke     Past Surgical History  Procedure Laterality  Date  . Cholecystectomy  1972  . Breast lumpectomy  1995  . Cardiac catheterization  2004       History of present illness and  Hospital Course:     Kindly see H&P for history of present illness and admission details, please review complete Labs, Consult reports and Test reports for all details in brief  HPI  from the history and physical done on the day of admission 01/10/2015 HPI This is a 81 year old female patient with a past medical history of remote breast cancer in 1996 status post lumpectomy and radiation therapy, hypertension, mild cognitive impairment, hyperlipidemia, reflux and vitamin D deficiency. Patient was sent to the ER via EMS after experiencing a syncopal event and collapse at home. Patient was seated on the toilet when she had a syncopal event with apparent loss of consciousness and fell to the floor. This resulted in a large contusion to the left supraorbital region as well as a laceration to the right eyebrow. Patient reports that she has been straining more to have bowel movements and has not taken  any laxatives. Patient does have an order for Lasix at home as needed for swelling but has not taken any dosages recently.  In the ER, patient was afebrile, blood pressure 161/74, pulse 90 and regular respirations 16 with room air sats 94%. Telemetry as well as EKG revealed sinus rhythm without any concerning findings. Patient's laceration was sutured in the emergency department and ice was applied to the contusion. CT of the head /cervical spine and maxillofacial bones revealed no acute intracranial injury in the soft tissue swelling on the left frontal scalp and periorbital soft tissue hematoma was visualized. Cervical spine without fracture or subluxation. Laboratory data unremarkable except for mild hyperglycemia glucose 180, glucose was 100 in the urinalysis and urine specific gravity is was 1.009.  In further discussion with the patient, in addition to the issues with  constipation the patient has had unintentional weight loss over the past several months going from 155 to 148 pounds but she attributes this to poor oral intake partly from lack of appetite but primarily because she just was not interested in preparing food. She reports she has recently begun to prepare more food and feels her weight may be increasing. She also reports intermittent issues with fatigue. She was recently evaluated in July by her primary care physician because of this. A TSH was obtained and was normal as was her CBC and no definitive causes for the fatigue identified noting patient at the same time had been treated for an acute upper rest for infection and was felt to be dehydrated. Patient has noticed a reemergence of the weakness and fatigue over the past few weeks. She reports that when she is mopping the floor in her home she has some mild chest twinges that go away when she rests. She also noticed dyspnea on exertion with activity such as mopping the floor. Her granddaughter also reports that on a recent trip while they were out walking around a department store patient became quite short winded and had to stop to catch her breath. At baseline patient ambulates with a cane and has a documented history of gait instability. Since arrival patient is complaining of tenderness over the anterior left lateral breast that was not present prior to her fall/syncopal event.   Hospital Course   Syncope and collapse  - Most likely related due to dehydration and infectious process  - Negative urinalysis , chest x-ray for possible early pneumonia in left lung . - On telemetry monitor (no events on telemetry), - 2-D echo EF 50-55%, no regional wall motion abnormality , grade 1 diastolic dysfunction .  Early community-acquired pneumonia present on admission - X-ray showing evolving left lung opacity,  - Treated with levofloxacin, to finish another 4 days as an outpatient (2 doses)  hydration and  weakness - Treated with IV fluids - PT consulted, and for discharge to SNF  vitamin D deficiency - Continue with supplements  Hyperglycemia - glycohemoglobin is 6, CBG control during hospital stay  Hypertension - Patient on Cardizem at home, but pressure un controlled, so started on oral hydralazine  Discharge Condition:  stable   Follow UP  Follow-up Information    Call REED, TIFFANY, DO.   Specialty:  Geriatric Medicine   Why:  Posthospitalization follow-up, after discharge from SNF   Contact information:   [address] [place] [address] [phone]         Discharge Instructions  and  Discharge Medications     Discharge Instructions  Diet - low sodium heart healthy    Complete by:  As directed      Increase activity slowly    Complete by:  As directed             Medication List    STOP taking these medications        acetaminophen 500 MG tablet  Commonly known as:  TYLENOL      TAKE these medications        acidophilus Caps capsule  Take 2 capsules by mouth daily. Take for 10 days then stop     cholecalciferol 1000 UNITS tablet  Commonly known as:  VITAMIN D  Take 1,000 Units by mouth daily after lunch.     diltiazem 180 MG 24 hr capsule  Commonly known as:  DILACOR XR  TAKE ONE CAPSULE BY MOUTH EVERY NIGHT AT BEDTIME FOR BLOOD PRESSURE     furosemide 20 MG tablet  Commonly known as:  LASIX  TAKE 1 TABLET BY MOUTH DAILY AS NEEDED FOR EDEMA     HALLS COUGH DROPS MT  Use as directed 1 lozenge in the mouth or throat 2 (two) times daily as needed (for cough).     hydrALAZINE 25 MG tablet  Commonly known as:  APRESOLINE  Take 1 tablet (25 mg total) by mouth every 8 (eight) hours.     HYDROcodone-acetaminophen 7.5-325 mg/15 ml solution  Commonly known as:  HYCET  Take 15 mLs by mouth 4 (four) times daily as needed for moderate pain.     levofloxacin 750 MG tablet  Commonly known as:  LEVAQUIN  Take 1 tablet (750 mg total) by  mouth daily. Take for another 4 days(total of 2 tablets), then stop  Start taking on:  01/14/2015     multivitamin with minerals Tabs tablet  Take 1 tablet by mouth daily.     ondansetron 4 MG tablet  Commonly known as:  ZOFRAN  Take 1 tablet (4 mg total) by mouth every 6 (six) hours as needed for nausea.     polyethylene glycol packet  Commonly known as:  MIRALAX / GLYCOLAX  Take 17 g by mouth daily as needed for mild constipation.     sorbitol 70 % Soln  Take 30 mLs by mouth daily as needed for moderate constipation.     WOMENS MULTIVITAMIN PLUS Tabs  Take 1 tablet by mouth daily.          Diet and Activity recommendation: See Discharge Instructions above   Consults obtained -  none   Major procedures and Radiology Reports - PLEASE review detailed and final reports for all details, in brief -      Dg Chest 2 View  01/10/2015   CLINICAL DATA:  Syncope  EXAM: CHEST  2 VIEW  COMPARISON:  None.  FINDINGS: Upper normal heart size. Minimal patchy density at the lateral left base. Lungs are otherwise clear. No pneumothorax. No pleural effusion.  IMPRESSION: Minimal patchy atelectasis versus airspace disease at the lateral left base.   Electronically Signed   By: Marybelle Killings M.D.   On: 01/10/2015 12:58   Dg Ribs Unilateral Left  01/13/2015   CLINICAL DATA:  Fall 3 days ago with pain around the left breast. Initial encounter.  EXAM: LEFT RIBS - 2 VIEW  COMPARISON:  Chest x-ray 3 days ago  FINDINGS: No fracture or other bone lesions are seen involving the ribs. No left pneumothorax or hemothorax. Opacity at the peripheral left base is  diminished compatible with atelectasis.  IMPRESSION: Negative for left rib fracture or pneumothorax.   Electronically Signed   By: Monte Fantasia M.D.   On: 01/13/2015 10:21   Ct Head Wo Contrast  01/10/2015   CLINICAL DATA:  Fall in bathroom. Headache and scalp laceration. Neck pain. Hypertension. Previous stroke.  EXAM: CT HEAD WITHOUT CONTRAST   CT MAXILLOFACIAL WITHOUT CONTRAST  CT CERVICAL SPINE WITHOUT CONTRAST  TECHNIQUE: Multidetector CT imaging of the head, cervical spine, and maxillofacial structures were performed using the standard protocol without intravenous contrast. Multiplanar CT image reconstructions of the cervical spine and maxillofacial structures were also generated.  COMPARISON:  Head CT on 12/24/2013 and cervical spine CT on 01/24/2013  FINDINGS: CT HEAD FINDINGS  There is no evidence of intracranial hemorrhage, brain edema, or other signs of acute infarction. There is no evidence of intracranial mass lesion or mass effect. No abnormal extraaxial fluid collections are identified.  Old left thalamic lacunar infarct again noted. Mild chronic small vessel disease again demonstrated. No evidence hydrocephalus.  Moderate left frontal scalp hematoma is seen. No evidence of skull fracture or pneumocephalus.  CT MAXILLOFACIAL FINDINGS  No evidence of acute fracture involving the facial bones or orbits. Mild left periorbital soft tissue hematoma is noted. Globes and other intraorbital anatomy are normal in appearance. No evidence of orbital emphysema or sinus air-fluid levels. No other significant bone abnormality identified.  CT CERVICAL SPINE FINDINGS  No evidence of acute fracture, subluxation, or prevertebral soft tissue swelling.  Mild to moderate degenerative disc disease is seen at C3-4 and C5-6. Moderate bilateral facet DJD is seen at most cervical levels. Atlantoaxial degenerative changes are also noted.  IMPRESSION: Left frontal scalp and periorbital soft tissue hematoma. No evidence of skull fracture or acute intracranial abnormality.  Mild chronic small vessel disease and old left thalamic lacunar infarct.  No evidence of orbital or facial bone fracture.  No evidence of cervical spine fracture or subluxation. Degenerative spondylosis, as described above.   Electronically Signed   By: Earle Gell M.D.   On: 01/10/2015 08:18   Ct  Cervical Spine Wo Contrast  01/10/2015   CLINICAL DATA:  Fall in bathroom. Headache and scalp laceration. Neck pain. Hypertension. Previous stroke.  EXAM: CT HEAD WITHOUT CONTRAST  CT MAXILLOFACIAL WITHOUT CONTRAST  CT CERVICAL SPINE WITHOUT CONTRAST  TECHNIQUE: Multidetector CT imaging of the head, cervical spine, and maxillofacial structures were performed using the standard protocol without intravenous contrast. Multiplanar CT image reconstructions of the cervical spine and maxillofacial structures were also generated.  COMPARISON:  Head CT on 12/24/2013 and cervical spine CT on 01/24/2013  FINDINGS: CT HEAD FINDINGS  There is no evidence of intracranial hemorrhage, brain edema, or other signs of acute infarction. There is no evidence of intracranial mass lesion or mass effect. No abnormal extraaxial fluid collections are identified.  Old left thalamic lacunar infarct again noted. Mild chronic small vessel disease again demonstrated. No evidence hydrocephalus.  Moderate left frontal scalp hematoma is seen. No evidence of skull fracture or pneumocephalus.  CT MAXILLOFACIAL FINDINGS  No evidence of acute fracture involving the facial bones or orbits. Mild left periorbital soft tissue hematoma is noted. Globes and other intraorbital anatomy are normal in appearance. No evidence of orbital emphysema or sinus air-fluid levels. No other significant bone abnormality identified.  CT CERVICAL SPINE FINDINGS  No evidence of acute fracture, subluxation, or prevertebral soft tissue swelling.  Mild to moderate degenerative disc disease is seen at C3-4 and C5-6.  Moderate bilateral facet DJD is seen at most cervical levels. Atlantoaxial degenerative changes are also noted.  IMPRESSION: Left frontal scalp and periorbital soft tissue hematoma. No evidence of skull fracture or acute intracranial abnormality.  Mild chronic small vessel disease and old left thalamic lacunar infarct.  No evidence of orbital or facial bone fracture.   No evidence of cervical spine fracture or subluxation. Degenerative spondylosis, as described above.   Electronically Signed   By: Earle Gell M.D.   On: 01/10/2015 08:18   Ct Maxillofacial Wo Cm  01/10/2015   CLINICAL DATA:  Fall in bathroom. Headache and scalp laceration. Neck pain. Hypertension. Previous stroke.  EXAM: CT HEAD WITHOUT CONTRAST  CT MAXILLOFACIAL WITHOUT CONTRAST  CT CERVICAL SPINE WITHOUT CONTRAST  TECHNIQUE: Multidetector CT imaging of the head, cervical spine, and maxillofacial structures were performed using the standard protocol without intravenous contrast. Multiplanar CT image reconstructions of the cervical spine and maxillofacial structures were also generated.  COMPARISON:  Head CT on 12/24/2013 and cervical spine CT on 01/24/2013  FINDINGS: CT HEAD FINDINGS  There is no evidence of intracranial hemorrhage, brain edema, or other signs of acute infarction. There is no evidence of intracranial mass lesion or mass effect. No abnormal extraaxial fluid collections are identified.  Old left thalamic lacunar infarct again noted. Mild chronic small vessel disease again demonstrated. No evidence hydrocephalus.  Moderate left frontal scalp hematoma is seen. No evidence of skull fracture or pneumocephalus.  CT MAXILLOFACIAL FINDINGS  No evidence of acute fracture involving the facial bones or orbits. Mild left periorbital soft tissue hematoma is noted. Globes and other intraorbital anatomy are normal in appearance. No evidence of orbital emphysema or sinus air-fluid levels. No other significant bone abnormality identified.  CT CERVICAL SPINE FINDINGS  No evidence of acute fracture, subluxation, or prevertebral soft tissue swelling.  Mild to moderate degenerative disc disease is seen at C3-4 and C5-6. Moderate bilateral facet DJD is seen at most cervical levels. Atlantoaxial degenerative changes are also noted.  IMPRESSION: Left frontal scalp and periorbital soft tissue hematoma. No evidence of  skull fracture or acute intracranial abnormality.  Mild chronic small vessel disease and old left thalamic lacunar infarct.  No evidence of orbital or facial bone fracture.  No evidence of cervical spine fracture or subluxation. Degenerative spondylosis, as described above.   Electronically Signed   By: Earle Gell M.D.   On: 01/10/2015 08:18    Micro Results     No results found for this or any previous visit (from the past 240 hour(s)).     Today   Subjective:   Beth Foster today has no headache,no chest abdominal pain,no new weakness tingling or numbness, feels much better  Today, reports no bowel movement since admission, cough significantly improved.   Objective:   Blood pressure 159/79, pulse 99, temperature 98.5 F (36.9 C), temperature source Oral, resp. rate 18, height 5\' 6"  (3.976 m), weight 69.5 kg (153 lb 3.5 oz), SpO2 95 %.   Intake/Output Summary (Last 24 hours) at 01/13/15 1150 Last data filed at 01/13/15 0931  Gross per 24 hour  Intake 2068.67 ml  Output    400 ml  Net 1668.67 ml    Exam Awake Alert, Oriented X 3, No new F.N deficits, Normal affect Left supraorbital/forehead hematoma/contusion, right eyebrow laceration status post repair. Supple Neck,No JVD, No cervical lymphadenopathy appriciated.  Symmetrical Chest wall movement, Good air movement bilaterally,  RRR,No Gallops,Rubs or new Murmurs, No Parasternal Heave +ve B.Sounds, Abd  Soft, No tenderness, No organomegaly appriciated, No rebound - guarding or rigidity. No Cyanosis, Clubbing or edema, No new Rash or bruise   Data Review   CBC w Diff: Lab Results  Component Value Date   WBC 5.0 01/12/2015   WBC 4.1 11/28/2014   HGB 13.4 01/12/2015   HCT 40.4 01/12/2015   HCT 41.6 11/28/2014   PLT 160 01/12/2015   LYMPHOPCT 37 12/24/2013   MONOPCT 9 12/24/2013   EOSPCT 1 12/24/2013   BASOPCT 0 12/24/2013    CMP: Lab Results  Component Value Date   NA 139 01/13/2015   NA 142 11/28/2014   K  3.7 01/13/2015   CL 107 01/13/2015   CO2 25 01/13/2015   BUN <5* 01/13/2015   BUN 8 11/28/2014   CREATININE 0.54 01/13/2015   PROT 7.0 11/28/2014   BILITOT 0.5 11/28/2014   BILITOT 0.3 10/28/2013   ALKPHOS 59 11/28/2014   AST 17 11/28/2014   ALT 14 11/28/2014  .   Total Time in preparing paper work, data evaluation and todays exam - 35 minutes  ELGERGAWY, DAWOOD M.D on 01/13/2015 at 11:50 AM  [facility]   Office  [phone]

## 2015-01-13 NOTE — Progress Notes (Signed)
Md made aware of patient elevated BP after decreasing stress in her environment pressures continue to remain high

## 2015-01-14 ENCOUNTER — Telehealth: Payer: Self-pay | Admitting: Internal Medicine

## 2015-01-14 ENCOUNTER — Telehealth: Payer: Self-pay

## 2015-01-14 NOTE — Telephone Encounter (Signed)
TOC (transition of cal) made to patient na, left message..Beth Foster

## 2015-01-14 NOTE — Telephone Encounter (Signed)
Patient's grand-daughter called requesting to speak directly with Dr.Reed. Patient is under care of Dr.Gates at [facility]. Patient's grand-daughter is questioning some of the medication changes that Dr.Gates is making and would like to touch base with Dr.Reed who will resume care once patient is released from rehab at [facility]  I called Vicente Males to get more information. Patient is taking hydralazine 1 by mouth every 8 hours and cardizem 180 mg 1 by mouth daily at bedtime. At times patient's b/p is high then sometimes it drop in a low normal range. Patient's grand-daughter was questioning if she should tell the facility to stop the hydralazine that was started in the hospital to avoid low b/p readings. Patient's grand-daughter is requesting to speak directly with Dr.Reed for advice for she feels Dr.Reed is more familiar with patient vs Dr.Gates that newly started seeing patient while in rehab.  Please advise

## 2015-01-15 NOTE — Telephone Encounter (Signed)
TOC.Marland KitchenMarland KitchenGranddaughter returned call.  Patient is presently in a Rehab center.  While she is in the facility, the provider will take over her care..cdavis .

## 2015-01-15 NOTE — Telephone Encounter (Signed)
Just FYI.   Spoke with Beth Foster.  Beth Foster was leaving her restroom and blacked out.  The fall sensor called the ambulance. She was conscious when EMS arrived.  Her whole head is black and blue.  She had several skin tears. She had 6 stitches placed over her eye.  She was given morphine for pain and admitted.  She had a workup and her CXR, CT brain and pulmonary tests were negative per her daughter.  She also was not orthostatic at the hospital.  She was then sent to [facility] for rehab.  She has remained very weak and constipated from pain medication.  This is being managed well.  She got very upset when she learned she had to go to rehab and her bp went sky high.  Her cardizem was increased to 240mg  and hydralazine was added three times a day.  Pt now feels weak and is not doing therapy.  Beth Foster says recently her systolic was 979 and she felt lightheaded and weak.  Pt is used to higher bps and I've never been able to get her to accept additional meds for bp control due to these feelings.  Orthostatics have not been checked at [facility] so this was suggested.  Pt is eager to go home, but not safe and Beth Foster is looking for another level of care for her long term (AL).

## 2015-01-16 LAB — VITAMIN D 1,25 DIHYDROXY
Vitamin D 1, 25 (OH)2 Total: 80 pg/mL
Vitamin D2 1, 25 (OH)2: <10 pg/mL
Vitamin D3 1, 25 (OH)2: 79 pg/mL

## 2015-01-29 DIAGNOSIS — Z8701 Personal history of pneumonia (recurrent): Secondary | ICD-10-CM | POA: Diagnosis not present

## 2015-01-29 DIAGNOSIS — H269 Unspecified cataract: Secondary | ICD-10-CM | POA: Diagnosis not present

## 2015-01-29 DIAGNOSIS — K219 Gastro-esophageal reflux disease without esophagitis: Secondary | ICD-10-CM | POA: Diagnosis not present

## 2015-01-29 DIAGNOSIS — R260 Ataxic gait: Secondary | ICD-10-CM | POA: Diagnosis not present

## 2015-01-29 DIAGNOSIS — I5032 Chronic diastolic (congestive) heart failure: Secondary | ICD-10-CM | POA: Diagnosis not present

## 2015-01-29 DIAGNOSIS — I10 Essential (primary) hypertension: Secondary | ICD-10-CM | POA: Diagnosis not present

## 2015-01-29 DIAGNOSIS — R55 Syncope and collapse: Secondary | ICD-10-CM | POA: Diagnosis not present

## 2015-01-29 DIAGNOSIS — B159 Hepatitis A without hepatic coma: Secondary | ICD-10-CM | POA: Diagnosis not present

## 2015-01-29 DIAGNOSIS — F0280 Dementia in other diseases classified elsewhere without behavioral disturbance: Secondary | ICD-10-CM | POA: Diagnosis not present

## 2015-01-29 DIAGNOSIS — M6281 Muscle weakness (generalized): Secondary | ICD-10-CM | POA: Diagnosis not present

## 2015-01-29 DIAGNOSIS — E559 Vitamin D deficiency, unspecified: Secondary | ICD-10-CM | POA: Diagnosis not present

## 2015-01-29 DIAGNOSIS — G309 Alzheimer's disease, unspecified: Secondary | ICD-10-CM | POA: Diagnosis not present

## 2015-01-29 DIAGNOSIS — F411 Generalized anxiety disorder: Secondary | ICD-10-CM | POA: Diagnosis not present

## 2015-02-02 DIAGNOSIS — F0280 Dementia in other diseases classified elsewhere without behavioral disturbance: Secondary | ICD-10-CM | POA: Diagnosis not present

## 2015-02-02 DIAGNOSIS — K219 Gastro-esophageal reflux disease without esophagitis: Secondary | ICD-10-CM | POA: Diagnosis not present

## 2015-02-02 DIAGNOSIS — H269 Unspecified cataract: Secondary | ICD-10-CM | POA: Diagnosis not present

## 2015-02-02 DIAGNOSIS — F411 Generalized anxiety disorder: Secondary | ICD-10-CM | POA: Diagnosis not present

## 2015-02-02 DIAGNOSIS — G309 Alzheimer's disease, unspecified: Secondary | ICD-10-CM | POA: Diagnosis not present

## 2015-02-02 DIAGNOSIS — B159 Hepatitis A without hepatic coma: Secondary | ICD-10-CM | POA: Diagnosis not present

## 2015-02-02 DIAGNOSIS — R260 Ataxic gait: Secondary | ICD-10-CM | POA: Diagnosis not present

## 2015-02-02 DIAGNOSIS — E559 Vitamin D deficiency, unspecified: Secondary | ICD-10-CM | POA: Diagnosis not present

## 2015-02-02 DIAGNOSIS — M6281 Muscle weakness (generalized): Secondary | ICD-10-CM | POA: Diagnosis not present

## 2015-02-02 DIAGNOSIS — I5032 Chronic diastolic (congestive) heart failure: Secondary | ICD-10-CM | POA: Diagnosis not present

## 2015-02-02 DIAGNOSIS — Z8701 Personal history of pneumonia (recurrent): Secondary | ICD-10-CM | POA: Diagnosis not present

## 2015-02-02 DIAGNOSIS — R55 Syncope and collapse: Secondary | ICD-10-CM | POA: Diagnosis not present

## 2015-02-02 DIAGNOSIS — I10 Essential (primary) hypertension: Secondary | ICD-10-CM | POA: Diagnosis not present

## 2015-02-03 DIAGNOSIS — R260 Ataxic gait: Secondary | ICD-10-CM | POA: Diagnosis not present

## 2015-02-03 DIAGNOSIS — G309 Alzheimer's disease, unspecified: Secondary | ICD-10-CM | POA: Diagnosis not present

## 2015-02-03 DIAGNOSIS — F0280 Dementia in other diseases classified elsewhere without behavioral disturbance: Secondary | ICD-10-CM | POA: Diagnosis not present

## 2015-02-03 DIAGNOSIS — H269 Unspecified cataract: Secondary | ICD-10-CM | POA: Diagnosis not present

## 2015-02-03 DIAGNOSIS — M6281 Muscle weakness (generalized): Secondary | ICD-10-CM | POA: Diagnosis not present

## 2015-02-03 DIAGNOSIS — E559 Vitamin D deficiency, unspecified: Secondary | ICD-10-CM | POA: Diagnosis not present

## 2015-02-03 DIAGNOSIS — Z8701 Personal history of pneumonia (recurrent): Secondary | ICD-10-CM | POA: Diagnosis not present

## 2015-02-03 DIAGNOSIS — B159 Hepatitis A without hepatic coma: Secondary | ICD-10-CM | POA: Diagnosis not present

## 2015-02-03 DIAGNOSIS — I10 Essential (primary) hypertension: Secondary | ICD-10-CM | POA: Diagnosis not present

## 2015-02-03 DIAGNOSIS — F411 Generalized anxiety disorder: Secondary | ICD-10-CM | POA: Diagnosis not present

## 2015-02-03 DIAGNOSIS — K219 Gastro-esophageal reflux disease without esophagitis: Secondary | ICD-10-CM | POA: Diagnosis not present

## 2015-02-03 DIAGNOSIS — R55 Syncope and collapse: Secondary | ICD-10-CM | POA: Diagnosis not present

## 2015-02-03 DIAGNOSIS — I5032 Chronic diastolic (congestive) heart failure: Secondary | ICD-10-CM | POA: Diagnosis not present

## 2015-02-04 DIAGNOSIS — K219 Gastro-esophageal reflux disease without esophagitis: Secondary | ICD-10-CM | POA: Diagnosis not present

## 2015-02-04 DIAGNOSIS — B159 Hepatitis A without hepatic coma: Secondary | ICD-10-CM | POA: Diagnosis not present

## 2015-02-04 DIAGNOSIS — E559 Vitamin D deficiency, unspecified: Secondary | ICD-10-CM | POA: Diagnosis not present

## 2015-02-04 DIAGNOSIS — R260 Ataxic gait: Secondary | ICD-10-CM | POA: Diagnosis not present

## 2015-02-04 DIAGNOSIS — G309 Alzheimer's disease, unspecified: Secondary | ICD-10-CM | POA: Diagnosis not present

## 2015-02-04 DIAGNOSIS — I10 Essential (primary) hypertension: Secondary | ICD-10-CM | POA: Diagnosis not present

## 2015-02-04 DIAGNOSIS — H269 Unspecified cataract: Secondary | ICD-10-CM | POA: Diagnosis not present

## 2015-02-04 DIAGNOSIS — M6281 Muscle weakness (generalized): Secondary | ICD-10-CM | POA: Diagnosis not present

## 2015-02-04 DIAGNOSIS — R55 Syncope and collapse: Secondary | ICD-10-CM | POA: Diagnosis not present

## 2015-02-04 DIAGNOSIS — I5032 Chronic diastolic (congestive) heart failure: Secondary | ICD-10-CM | POA: Diagnosis not present

## 2015-02-04 DIAGNOSIS — Z8701 Personal history of pneumonia (recurrent): Secondary | ICD-10-CM | POA: Diagnosis not present

## 2015-02-04 DIAGNOSIS — F0280 Dementia in other diseases classified elsewhere without behavioral disturbance: Secondary | ICD-10-CM | POA: Diagnosis not present

## 2015-02-04 DIAGNOSIS — F411 Generalized anxiety disorder: Secondary | ICD-10-CM | POA: Diagnosis not present

## 2015-02-05 ENCOUNTER — Ambulatory Visit (INDEPENDENT_AMBULATORY_CARE_PROVIDER_SITE_OTHER): Payer: Medicare Other | Admitting: Internal Medicine

## 2015-02-05 ENCOUNTER — Encounter: Payer: Self-pay | Admitting: Internal Medicine

## 2015-02-05 VITALS — BP 140/80 | HR 81 | Temp 97.6°F | Resp 20 | Ht 66.0 in | Wt 145.2 lb

## 2015-02-05 DIAGNOSIS — K219 Gastro-esophageal reflux disease without esophagitis: Secondary | ICD-10-CM | POA: Diagnosis not present

## 2015-02-05 DIAGNOSIS — I10 Essential (primary) hypertension: Secondary | ICD-10-CM

## 2015-02-05 DIAGNOSIS — H269 Unspecified cataract: Secondary | ICD-10-CM | POA: Diagnosis not present

## 2015-02-05 DIAGNOSIS — I5032 Chronic diastolic (congestive) heart failure: Secondary | ICD-10-CM | POA: Diagnosis not present

## 2015-02-05 DIAGNOSIS — S0083XD Contusion of other part of head, subsequent encounter: Secondary | ICD-10-CM

## 2015-02-05 DIAGNOSIS — Z8701 Personal history of pneumonia (recurrent): Secondary | ICD-10-CM | POA: Diagnosis not present

## 2015-02-05 DIAGNOSIS — R51 Headache: Secondary | ICD-10-CM

## 2015-02-05 DIAGNOSIS — M6281 Muscle weakness (generalized): Secondary | ICD-10-CM | POA: Diagnosis not present

## 2015-02-05 DIAGNOSIS — G309 Alzheimer's disease, unspecified: Secondary | ICD-10-CM | POA: Diagnosis not present

## 2015-02-05 DIAGNOSIS — R55 Syncope and collapse: Secondary | ICD-10-CM | POA: Diagnosis not present

## 2015-02-05 DIAGNOSIS — F0280 Dementia in other diseases classified elsewhere without behavioral disturbance: Secondary | ICD-10-CM | POA: Diagnosis not present

## 2015-02-05 DIAGNOSIS — R519 Headache, unspecified: Secondary | ICD-10-CM

## 2015-02-05 DIAGNOSIS — H8110 Benign paroxysmal vertigo, unspecified ear: Secondary | ICD-10-CM

## 2015-02-05 DIAGNOSIS — B159 Hepatitis A without hepatic coma: Secondary | ICD-10-CM | POA: Diagnosis not present

## 2015-02-05 DIAGNOSIS — R269 Unspecified abnormalities of gait and mobility: Secondary | ICD-10-CM | POA: Diagnosis not present

## 2015-02-05 DIAGNOSIS — F411 Generalized anxiety disorder: Secondary | ICD-10-CM | POA: Diagnosis not present

## 2015-02-05 DIAGNOSIS — E559 Vitamin D deficiency, unspecified: Secondary | ICD-10-CM | POA: Diagnosis not present

## 2015-02-05 DIAGNOSIS — R260 Ataxic gait: Secondary | ICD-10-CM | POA: Diagnosis not present

## 2015-02-05 DIAGNOSIS — G3184 Mild cognitive impairment, so stated: Secondary | ICD-10-CM

## 2015-02-05 NOTE — Progress Notes (Signed)
Patient ID: Beth Foster, female   DOB: 03/05/1929, 81 y.o.   MRN: [id number]   Location:  [facility]  Code Status: DNR Goals of Care: Advanced Directive information Does patient have an advance directive?: Yes, Would patient like information on creating an advanced directive?: No - patient declined information, Type of Advance Directive: [place], Does patient want to make changes to advanced directive?: No - Patient declined   Chief Complaint  Patient presents with  . Medical Management of Chronic Issues    f/u from fall, HA are off and on since fall    HPI: Patient is a 81 y.o. white female seen in the office today for f/u after a hospitalization and rehab stay at [facility]. Reviewed hospital records and notes from [facility] rehab.  Synopsis of what happened:Beth Foster was leaving her restroom and blacked out. The fall sensor called the ambulance. She was conscious when EMS arrived. Her whole head is black and blue. She had several skin tears. She had 6 stitches placed over her eye. She was given morphine for pain and admitted. She had a workup and her CXR, CT brain and pulmonary tests were negative per her daughter. She also was not orthostatic at the hospital. She was then sent to [facility] for rehab. She has remained very weak and constipated from pain medication. This is being managed well. She got very upset when she learned she had to go to rehab and her bp went sky high. Her cardizem was increased to 240mg  and hydralazine was added three times a day. Pt now feels weak and is not doing therapy. Vicente Males says recently her systolic was 630 and she felt lightheaded and weak. Pt is used to higher bps and I've never been able to get her to accept additional meds for bp control due to these feelings. Orthostatics have not been checked at [facility] so this was suggested. Pt is eager to go home, but not safe and Vicente Males is looking  for another level of care for her long term (AL).   Still getting headaches.  Bruising improved.  Got very dizzy like under water after OT on Monday.  He was treating her for BPPV with Epley maneuver.  Took a zofran for the nausea.  Has helped.    Not every time she gets up.  Is using her rollator walker now.  Her granddaughters have also wanted this for years just like I have.  Pt says she's been weaker on the right than the left.  They also gave her leg exercises.  Had home safety eval yesterday also.  Left leg longer than right and has scoliosis.  Has had to step with left leg before right leg.  Wants to go to the side if she steps with right leg first.  Right leg never used to be weaker than left.    Has been drinking more water.   She is staying with Vicente Males now.  Has the two dogs with her in the daytime.    Review of Systems:  Review of Systems  Constitutional: Negative for fever and chills.  HENT: Positive for hearing loss. Negative for congestion.   Eyes: Negative for blurred vision.  Respiratory: Negative for shortness of breath.   Cardiovascular: Negative for chest pain and leg swelling.  Gastrointestinal: Negative for abdominal pain, constipation, blood in stool and melena.  Genitourinary: Negative for dysuria, urgency and frequency.  Musculoskeletal: Positive for falls.  Skin: Negative for  rash.       Ecchymoses of face  Neurological: Positive for dizziness. Negative for sensory change, speech change, focal weakness and loss of consciousness.  Endo/Heme/Allergies: Bruises/bleeds easily.  Psychiatric/Behavioral: Positive for memory loss. Negative for depression. The patient is not nervous/anxious and does not have insomnia.     Past Medical History  Diagnosis Date  . GERD (gastroesophageal reflux disease)   . Hypertension   . Pain in joint, pelvic region and thigh   . Vitamin D deficiency   . Viral hepatitis A without mention of hepatic coma   . Anxiety state, unspecified     . Unspecified cataract   . Unspecified late effects of cerebrovascular disease   . External hemorrhoids without mention of complication   . Contact dermatitis and other eczema due to other chemical products   . Bunion   . Rash and other nonspecific skin eruption   . Edema   . Viral hepatitis A without mention of hepatic coma   . Malignant neoplasm of breast (female), unspecified site dx'd 1996    rt breast; xrt   . Stroke     Past Surgical History  Procedure Laterality Date  . Cholecystectomy  1972  . Breast lumpectomy  1995  . Cardiac catheterization  2004    Allergies  Allergen Reactions  . Amoxicillin Anaphylaxis and Swelling  . Angiotensin Receptor Blockers Anaphylaxis, Itching and Rash    Tongue swelling  . Beta Adrenergic Blockers Anaphylaxis, Itching and Rash    Tongue swelling   . Cephalexin Anaphylaxis    Tongue swelling  . Clindamycin/Lincomycin Anaphylaxis, Itching and Rash    Tongue swelling  . Eggs Or Egg-Derived Products Other (See Comments)    Blisters in mouth after eating for 3 days, mild reaction egg-derived vaccines  . Tape Other (See Comments)    Pulls skin off.  Please use "paper" tape.  . Corticosteroids Itching and Rash  . Hydrocortisone Itching and Rash  . Latex Rash  . Other Itching and Rash    Pine allergy, Fragrance free soaps and laundry products  . Reglan [Metoclopramide] Itching and Rash   Medications: Patient's Medications  New Prescriptions   No medications on file  Previous Medications   CHOLECALCIFEROL (VITAMIN D) 1000 UNITS TABLET    Take 1,000 Units by mouth daily after lunch.    DILT-XR 240 MG 24 HR CAPSULE    Take 1 capsule by mouth at bedtime.   FUROSEMIDE (LASIX) 20 MG TABLET    TAKE 1 TABLET BY MOUTH DAILY AS NEEDED FOR EDEMA   HYDRALAZINE (APRESOLINE) 25 MG TABLET    Take 1 tablet (25 mg total) by mouth every 8 (eight) hours.   HYDROCODONE-ACETAMINOPHEN (HYCET) 7.5-325 MG/15 ML SOLUTION    Take 15 mLs by mouth 4 (four)  times daily as needed for moderate pain.   MENTHOL (HALLS COUGH DROPS MT)    Use as directed 1 lozenge in the mouth or throat 2 (two) times daily as needed (for cough).   MULTIPLE VITAMIN (MULTIVITAMIN WITH MINERALS) TABS TABLET    Take 1 tablet by mouth daily.   MULTIPLE VITAMINS-MINERALS (WOMENS MULTIVITAMIN PLUS) TABS    Take 1 tablet by mouth daily.   ONDANSETRON (ZOFRAN) 4 MG TABLET    Take 1 tablet (4 mg total) by mouth every 6 (six) hours as needed for nausea.   POLYETHYLENE GLYCOL (MIRALAX / GLYCOLAX) PACKET    Take 17 g by mouth daily as needed for mild constipation.   SORBITOL  70 % SOLN    Take 30 mLs by mouth daily as needed for moderate constipation.  Modified Medications   No medications on file  Discontinued Medications   DILTIAZEM (DILACOR XR) 180 MG 24 HR CAPSULE    TAKE ONE CAPSULE BY MOUTH EVERY NIGHT AT BEDTIME FOR BLOOD PRESSURE    Physical Exam: Filed Vitals:   02/05/15 1604  BP: 140/80  Pulse: 81  Temp: 97.6 F (36.4 C)  TempSrc: Oral  Resp: 20  Height: 5\' 6"  (1.676 m)  Weight: 145 lb 3.2 oz (65.862 kg)  SpO2: 97%   Physical Exam  Constitutional: She is oriented to person, place, and time. She appears well-developed and well-nourished. No distress.  Cardiovascular: Normal rate, regular rhythm, normal heart sounds and intact distal pulses.   Pulmonary/Chest: Effort normal and breath sounds normal. No respiratory distress.  Abdominal: Soft. Bowel sounds are normal.  Genitourinary: No vaginal discharge found.  Musculoskeletal: Normal range of motion. She exhibits no edema or tenderness.  Neurological: She is alert and oriented to person, place, and time.  Skin: Skin is warm and dry.  Ecchymoses on face of bilateral eyes and left side of neck (from fall early august)  Psychiatric: She has a normal mood and affect.    Labs reviewed: Basic Metabolic Panel:  Recent Labs  11/28/14 0921 01/10/15 0722 01/12/15 0536 01/12/15 0926 01/13/15 0700  NA 142 139  139  --  139  K 4.2 3.6 3.2*  --  3.7  CL 104 105 106  --  107  CO2 22 26 24   --  25  GLUCOSE 109* 180* 97  --  109*  BUN 8 8 5*  --  <5*  CREATININE 0.59 0.67 0.58  --  0.54  CALCIUM 9.3 8.9 8.4*  --  9.1  MG  --   --  1.9  --   --   PHOS  --   --   --  2.1*  --   TSH 0.802  --   --   --   --    Liver Function Tests:  Recent Labs  11/28/14 0921  AST 17  ALT 14  ALKPHOS 59  BILITOT 0.5  PROT 7.0   No results for input(s): LIPASE, AMYLASE in the last 8760 hours. No results for input(s): AMMONIA in the last 8760 hours. CBC:  Recent Labs  11/28/14 0921 01/10/15 0722 01/12/15 0536  WBC 4.1 9.2 5.0  NEUTROABS 2.5  --   --   HGB  --  13.9 13.4  HCT 41.6 41.3 40.4  MCV  --  96.7 98.1  PLT  --  177 160   Lipid Panel:  Recent Labs  11/28/14 0921  CHOL 217*  HDL 96  LDLCALC 109*  TRIG 62  CHOLHDL 2.3   Lab Results  Component Value Date   HGBA1C 6.0* 01/10/2015   Assessment/Plan 1. Syncope and collapse -etiology unclear but now sounds like she had pneumonia and vertigo -was hospitalized then in rehab at [facility] through earlier last week  2. Facial bruising, subsequent encounter -gradually resolving s/p syncope with fall  3. Essential hypertension, benign -bp right at goal at 140 /80 today -cont current regimen  4. Frequent headaches -ongoing problem -I thought it was b/c of her bp being elevated historically -cont to monitor -due to pt's perceived right LE weakness, will consider MRI if these are ongoing  5. Mild cognitive impairment with memory loss -has been stable, was quite alert today and  did not repeat any stories or questions  6. Gait disorder -cont PT at home and use of rollator walker at all times  7. BPPV (benign paroxysmal positional vertigo), unspecified laterality -cont epley maneuver for her vertigo--seems this has helped; cont zofran as needed for nausea  Labs/tests ordered:  No orders of the defined types were placed in this  encounter.   Will check labs at next appt  Next appt:  2 mos  Narya Beavin L. Jianni Batten, D.O. [facility] [address], [place] [address] Cell Phone (Mon-Fri 8am-5pm):  [phone] On Call:  [phone] & follow prompts after 5pm & weekends Office Phone:  [phone] Office Fax:  [phone]

## 2015-02-09 DIAGNOSIS — K219 Gastro-esophageal reflux disease without esophagitis: Secondary | ICD-10-CM | POA: Diagnosis not present

## 2015-02-09 DIAGNOSIS — F411 Generalized anxiety disorder: Secondary | ICD-10-CM | POA: Diagnosis not present

## 2015-02-09 DIAGNOSIS — G309 Alzheimer's disease, unspecified: Secondary | ICD-10-CM | POA: Diagnosis not present

## 2015-02-09 DIAGNOSIS — B159 Hepatitis A without hepatic coma: Secondary | ICD-10-CM | POA: Diagnosis not present

## 2015-02-09 DIAGNOSIS — H269 Unspecified cataract: Secondary | ICD-10-CM | POA: Diagnosis not present

## 2015-02-09 DIAGNOSIS — R260 Ataxic gait: Secondary | ICD-10-CM | POA: Diagnosis not present

## 2015-02-09 DIAGNOSIS — M6281 Muscle weakness (generalized): Secondary | ICD-10-CM | POA: Diagnosis not present

## 2015-02-09 DIAGNOSIS — R55 Syncope and collapse: Secondary | ICD-10-CM | POA: Diagnosis not present

## 2015-02-09 DIAGNOSIS — F0280 Dementia in other diseases classified elsewhere without behavioral disturbance: Secondary | ICD-10-CM | POA: Diagnosis not present

## 2015-02-09 DIAGNOSIS — I10 Essential (primary) hypertension: Secondary | ICD-10-CM | POA: Diagnosis not present

## 2015-02-09 DIAGNOSIS — E559 Vitamin D deficiency, unspecified: Secondary | ICD-10-CM | POA: Diagnosis not present

## 2015-02-09 DIAGNOSIS — I5032 Chronic diastolic (congestive) heart failure: Secondary | ICD-10-CM | POA: Diagnosis not present

## 2015-02-09 DIAGNOSIS — Z8701 Personal history of pneumonia (recurrent): Secondary | ICD-10-CM | POA: Diagnosis not present

## 2015-02-11 DIAGNOSIS — G309 Alzheimer's disease, unspecified: Secondary | ICD-10-CM | POA: Diagnosis not present

## 2015-02-11 DIAGNOSIS — F411 Generalized anxiety disorder: Secondary | ICD-10-CM | POA: Diagnosis not present

## 2015-02-11 DIAGNOSIS — K219 Gastro-esophageal reflux disease without esophagitis: Secondary | ICD-10-CM | POA: Diagnosis not present

## 2015-02-11 DIAGNOSIS — M6281 Muscle weakness (generalized): Secondary | ICD-10-CM | POA: Diagnosis not present

## 2015-02-11 DIAGNOSIS — I10 Essential (primary) hypertension: Secondary | ICD-10-CM | POA: Diagnosis not present

## 2015-02-11 DIAGNOSIS — I5032 Chronic diastolic (congestive) heart failure: Secondary | ICD-10-CM | POA: Diagnosis not present

## 2015-02-11 DIAGNOSIS — F0280 Dementia in other diseases classified elsewhere without behavioral disturbance: Secondary | ICD-10-CM | POA: Diagnosis not present

## 2015-02-11 DIAGNOSIS — B159 Hepatitis A without hepatic coma: Secondary | ICD-10-CM | POA: Diagnosis not present

## 2015-02-11 DIAGNOSIS — E559 Vitamin D deficiency, unspecified: Secondary | ICD-10-CM | POA: Diagnosis not present

## 2015-02-11 DIAGNOSIS — Z8701 Personal history of pneumonia (recurrent): Secondary | ICD-10-CM | POA: Diagnosis not present

## 2015-02-11 DIAGNOSIS — R260 Ataxic gait: Secondary | ICD-10-CM | POA: Diagnosis not present

## 2015-02-11 DIAGNOSIS — R55 Syncope and collapse: Secondary | ICD-10-CM | POA: Diagnosis not present

## 2015-02-11 DIAGNOSIS — H269 Unspecified cataract: Secondary | ICD-10-CM | POA: Diagnosis not present

## 2015-02-17 DIAGNOSIS — R55 Syncope and collapse: Secondary | ICD-10-CM | POA: Diagnosis not present

## 2015-02-17 DIAGNOSIS — M6281 Muscle weakness (generalized): Secondary | ICD-10-CM | POA: Diagnosis not present

## 2015-02-17 DIAGNOSIS — R260 Ataxic gait: Secondary | ICD-10-CM | POA: Diagnosis not present

## 2015-02-17 DIAGNOSIS — F0280 Dementia in other diseases classified elsewhere without behavioral disturbance: Secondary | ICD-10-CM | POA: Diagnosis not present

## 2015-02-17 DIAGNOSIS — H269 Unspecified cataract: Secondary | ICD-10-CM | POA: Diagnosis not present

## 2015-02-17 DIAGNOSIS — G309 Alzheimer's disease, unspecified: Secondary | ICD-10-CM | POA: Diagnosis not present

## 2015-02-17 DIAGNOSIS — K219 Gastro-esophageal reflux disease without esophagitis: Secondary | ICD-10-CM | POA: Diagnosis not present

## 2015-02-17 DIAGNOSIS — F411 Generalized anxiety disorder: Secondary | ICD-10-CM | POA: Diagnosis not present

## 2015-02-17 DIAGNOSIS — E559 Vitamin D deficiency, unspecified: Secondary | ICD-10-CM | POA: Diagnosis not present

## 2015-02-17 DIAGNOSIS — Z8701 Personal history of pneumonia (recurrent): Secondary | ICD-10-CM | POA: Diagnosis not present

## 2015-02-17 DIAGNOSIS — I10 Essential (primary) hypertension: Secondary | ICD-10-CM | POA: Diagnosis not present

## 2015-02-17 DIAGNOSIS — I5032 Chronic diastolic (congestive) heart failure: Secondary | ICD-10-CM | POA: Diagnosis not present

## 2015-02-17 DIAGNOSIS — B159 Hepatitis A without hepatic coma: Secondary | ICD-10-CM | POA: Diagnosis not present

## 2015-02-19 DIAGNOSIS — H269 Unspecified cataract: Secondary | ICD-10-CM | POA: Diagnosis not present

## 2015-02-19 DIAGNOSIS — I5032 Chronic diastolic (congestive) heart failure: Secondary | ICD-10-CM | POA: Diagnosis not present

## 2015-02-19 DIAGNOSIS — B159 Hepatitis A without hepatic coma: Secondary | ICD-10-CM | POA: Diagnosis not present

## 2015-02-19 DIAGNOSIS — F411 Generalized anxiety disorder: Secondary | ICD-10-CM | POA: Diagnosis not present

## 2015-02-19 DIAGNOSIS — G309 Alzheimer's disease, unspecified: Secondary | ICD-10-CM | POA: Diagnosis not present

## 2015-02-19 DIAGNOSIS — E559 Vitamin D deficiency, unspecified: Secondary | ICD-10-CM | POA: Diagnosis not present

## 2015-02-19 DIAGNOSIS — R260 Ataxic gait: Secondary | ICD-10-CM | POA: Diagnosis not present

## 2015-02-19 DIAGNOSIS — R55 Syncope and collapse: Secondary | ICD-10-CM | POA: Diagnosis not present

## 2015-02-19 DIAGNOSIS — M6281 Muscle weakness (generalized): Secondary | ICD-10-CM | POA: Diagnosis not present

## 2015-02-19 DIAGNOSIS — F0280 Dementia in other diseases classified elsewhere without behavioral disturbance: Secondary | ICD-10-CM | POA: Diagnosis not present

## 2015-02-19 DIAGNOSIS — Z8701 Personal history of pneumonia (recurrent): Secondary | ICD-10-CM | POA: Diagnosis not present

## 2015-02-19 DIAGNOSIS — K219 Gastro-esophageal reflux disease without esophagitis: Secondary | ICD-10-CM | POA: Diagnosis not present

## 2015-02-19 DIAGNOSIS — I10 Essential (primary) hypertension: Secondary | ICD-10-CM | POA: Diagnosis not present

## 2015-02-22 ENCOUNTER — Other Ambulatory Visit: Payer: Self-pay | Admitting: Internal Medicine

## 2015-02-24 DIAGNOSIS — H269 Unspecified cataract: Secondary | ICD-10-CM | POA: Diagnosis not present

## 2015-02-24 DIAGNOSIS — R260 Ataxic gait: Secondary | ICD-10-CM | POA: Diagnosis not present

## 2015-02-24 DIAGNOSIS — B159 Hepatitis A without hepatic coma: Secondary | ICD-10-CM | POA: Diagnosis not present

## 2015-02-24 DIAGNOSIS — F0280 Dementia in other diseases classified elsewhere without behavioral disturbance: Secondary | ICD-10-CM | POA: Diagnosis not present

## 2015-02-24 DIAGNOSIS — F411 Generalized anxiety disorder: Secondary | ICD-10-CM | POA: Diagnosis not present

## 2015-02-24 DIAGNOSIS — G309 Alzheimer's disease, unspecified: Secondary | ICD-10-CM | POA: Diagnosis not present

## 2015-02-24 DIAGNOSIS — M6281 Muscle weakness (generalized): Secondary | ICD-10-CM | POA: Diagnosis not present

## 2015-02-24 DIAGNOSIS — K219 Gastro-esophageal reflux disease without esophagitis: Secondary | ICD-10-CM | POA: Diagnosis not present

## 2015-02-24 DIAGNOSIS — Z8701 Personal history of pneumonia (recurrent): Secondary | ICD-10-CM | POA: Diagnosis not present

## 2015-02-24 DIAGNOSIS — I10 Essential (primary) hypertension: Secondary | ICD-10-CM | POA: Diagnosis not present

## 2015-02-24 DIAGNOSIS — E559 Vitamin D deficiency, unspecified: Secondary | ICD-10-CM | POA: Diagnosis not present

## 2015-02-24 DIAGNOSIS — I5032 Chronic diastolic (congestive) heart failure: Secondary | ICD-10-CM | POA: Diagnosis not present

## 2015-02-24 DIAGNOSIS — R55 Syncope and collapse: Secondary | ICD-10-CM | POA: Diagnosis not present

## 2015-02-26 DIAGNOSIS — M6281 Muscle weakness (generalized): Secondary | ICD-10-CM | POA: Diagnosis not present

## 2015-02-26 DIAGNOSIS — K219 Gastro-esophageal reflux disease without esophagitis: Secondary | ICD-10-CM | POA: Diagnosis not present

## 2015-02-26 DIAGNOSIS — B159 Hepatitis A without hepatic coma: Secondary | ICD-10-CM | POA: Diagnosis not present

## 2015-02-26 DIAGNOSIS — R55 Syncope and collapse: Secondary | ICD-10-CM | POA: Diagnosis not present

## 2015-02-26 DIAGNOSIS — Z8701 Personal history of pneumonia (recurrent): Secondary | ICD-10-CM | POA: Diagnosis not present

## 2015-02-26 DIAGNOSIS — I10 Essential (primary) hypertension: Secondary | ICD-10-CM | POA: Diagnosis not present

## 2015-02-26 DIAGNOSIS — R260 Ataxic gait: Secondary | ICD-10-CM | POA: Diagnosis not present

## 2015-02-26 DIAGNOSIS — E559 Vitamin D deficiency, unspecified: Secondary | ICD-10-CM | POA: Diagnosis not present

## 2015-02-26 DIAGNOSIS — G309 Alzheimer's disease, unspecified: Secondary | ICD-10-CM | POA: Diagnosis not present

## 2015-02-26 DIAGNOSIS — H269 Unspecified cataract: Secondary | ICD-10-CM | POA: Diagnosis not present

## 2015-02-26 DIAGNOSIS — F0280 Dementia in other diseases classified elsewhere without behavioral disturbance: Secondary | ICD-10-CM | POA: Diagnosis not present

## 2015-02-26 DIAGNOSIS — F411 Generalized anxiety disorder: Secondary | ICD-10-CM | POA: Diagnosis not present

## 2015-02-26 DIAGNOSIS — I5032 Chronic diastolic (congestive) heart failure: Secondary | ICD-10-CM | POA: Diagnosis not present

## 2015-03-02 DIAGNOSIS — R55 Syncope and collapse: Secondary | ICD-10-CM | POA: Diagnosis not present

## 2015-03-02 DIAGNOSIS — B159 Hepatitis A without hepatic coma: Secondary | ICD-10-CM | POA: Diagnosis not present

## 2015-03-02 DIAGNOSIS — G309 Alzheimer's disease, unspecified: Secondary | ICD-10-CM | POA: Diagnosis not present

## 2015-03-02 DIAGNOSIS — I10 Essential (primary) hypertension: Secondary | ICD-10-CM | POA: Diagnosis not present

## 2015-03-02 DIAGNOSIS — E559 Vitamin D deficiency, unspecified: Secondary | ICD-10-CM | POA: Diagnosis not present

## 2015-03-02 DIAGNOSIS — H269 Unspecified cataract: Secondary | ICD-10-CM | POA: Diagnosis not present

## 2015-03-02 DIAGNOSIS — F0280 Dementia in other diseases classified elsewhere without behavioral disturbance: Secondary | ICD-10-CM | POA: Diagnosis not present

## 2015-03-02 DIAGNOSIS — I5032 Chronic diastolic (congestive) heart failure: Secondary | ICD-10-CM | POA: Diagnosis not present

## 2015-03-02 DIAGNOSIS — R260 Ataxic gait: Secondary | ICD-10-CM | POA: Diagnosis not present

## 2015-03-02 DIAGNOSIS — Z8701 Personal history of pneumonia (recurrent): Secondary | ICD-10-CM | POA: Diagnosis not present

## 2015-03-02 DIAGNOSIS — F411 Generalized anxiety disorder: Secondary | ICD-10-CM | POA: Diagnosis not present

## 2015-03-02 DIAGNOSIS — M6281 Muscle weakness (generalized): Secondary | ICD-10-CM | POA: Diagnosis not present

## 2015-03-02 DIAGNOSIS — K219 Gastro-esophageal reflux disease without esophagitis: Secondary | ICD-10-CM | POA: Diagnosis not present

## 2015-03-04 DIAGNOSIS — M6281 Muscle weakness (generalized): Secondary | ICD-10-CM | POA: Diagnosis not present

## 2015-03-04 DIAGNOSIS — E559 Vitamin D deficiency, unspecified: Secondary | ICD-10-CM | POA: Diagnosis not present

## 2015-03-04 DIAGNOSIS — H269 Unspecified cataract: Secondary | ICD-10-CM | POA: Diagnosis not present

## 2015-03-04 DIAGNOSIS — Z8701 Personal history of pneumonia (recurrent): Secondary | ICD-10-CM | POA: Diagnosis not present

## 2015-03-04 DIAGNOSIS — F411 Generalized anxiety disorder: Secondary | ICD-10-CM | POA: Diagnosis not present

## 2015-03-04 DIAGNOSIS — B159 Hepatitis A without hepatic coma: Secondary | ICD-10-CM | POA: Diagnosis not present

## 2015-03-04 DIAGNOSIS — R260 Ataxic gait: Secondary | ICD-10-CM | POA: Diagnosis not present

## 2015-03-04 DIAGNOSIS — G309 Alzheimer's disease, unspecified: Secondary | ICD-10-CM | POA: Diagnosis not present

## 2015-03-04 DIAGNOSIS — I5032 Chronic diastolic (congestive) heart failure: Secondary | ICD-10-CM | POA: Diagnosis not present

## 2015-03-04 DIAGNOSIS — K219 Gastro-esophageal reflux disease without esophagitis: Secondary | ICD-10-CM | POA: Diagnosis not present

## 2015-03-04 DIAGNOSIS — R55 Syncope and collapse: Secondary | ICD-10-CM | POA: Diagnosis not present

## 2015-03-04 DIAGNOSIS — I10 Essential (primary) hypertension: Secondary | ICD-10-CM | POA: Diagnosis not present

## 2015-03-04 DIAGNOSIS — F0280 Dementia in other diseases classified elsewhere without behavioral disturbance: Secondary | ICD-10-CM | POA: Diagnosis not present

## 2015-03-05 ENCOUNTER — Ambulatory Visit: Payer: Medicare Other | Admitting: Internal Medicine

## 2015-03-09 DIAGNOSIS — E559 Vitamin D deficiency, unspecified: Secondary | ICD-10-CM | POA: Diagnosis not present

## 2015-03-09 DIAGNOSIS — B159 Hepatitis A without hepatic coma: Secondary | ICD-10-CM | POA: Diagnosis not present

## 2015-03-09 DIAGNOSIS — F0280 Dementia in other diseases classified elsewhere without behavioral disturbance: Secondary | ICD-10-CM | POA: Diagnosis not present

## 2015-03-09 DIAGNOSIS — F411 Generalized anxiety disorder: Secondary | ICD-10-CM | POA: Diagnosis not present

## 2015-03-09 DIAGNOSIS — R260 Ataxic gait: Secondary | ICD-10-CM | POA: Diagnosis not present

## 2015-03-09 DIAGNOSIS — I10 Essential (primary) hypertension: Secondary | ICD-10-CM | POA: Diagnosis not present

## 2015-03-09 DIAGNOSIS — K219 Gastro-esophageal reflux disease without esophagitis: Secondary | ICD-10-CM | POA: Diagnosis not present

## 2015-03-09 DIAGNOSIS — Z8701 Personal history of pneumonia (recurrent): Secondary | ICD-10-CM | POA: Diagnosis not present

## 2015-03-09 DIAGNOSIS — M6281 Muscle weakness (generalized): Secondary | ICD-10-CM | POA: Diagnosis not present

## 2015-03-09 DIAGNOSIS — R55 Syncope and collapse: Secondary | ICD-10-CM | POA: Diagnosis not present

## 2015-03-09 DIAGNOSIS — I5032 Chronic diastolic (congestive) heart failure: Secondary | ICD-10-CM | POA: Diagnosis not present

## 2015-03-09 DIAGNOSIS — H269 Unspecified cataract: Secondary | ICD-10-CM | POA: Diagnosis not present

## 2015-03-09 DIAGNOSIS — G309 Alzheimer's disease, unspecified: Secondary | ICD-10-CM | POA: Diagnosis not present

## 2015-03-11 DIAGNOSIS — I10 Essential (primary) hypertension: Secondary | ICD-10-CM | POA: Diagnosis not present

## 2015-03-11 DIAGNOSIS — B159 Hepatitis A without hepatic coma: Secondary | ICD-10-CM | POA: Diagnosis not present

## 2015-03-11 DIAGNOSIS — G309 Alzheimer's disease, unspecified: Secondary | ICD-10-CM | POA: Diagnosis not present

## 2015-03-11 DIAGNOSIS — R260 Ataxic gait: Secondary | ICD-10-CM | POA: Diagnosis not present

## 2015-03-11 DIAGNOSIS — H269 Unspecified cataract: Secondary | ICD-10-CM | POA: Diagnosis not present

## 2015-03-11 DIAGNOSIS — Z8701 Personal history of pneumonia (recurrent): Secondary | ICD-10-CM | POA: Diagnosis not present

## 2015-03-11 DIAGNOSIS — F411 Generalized anxiety disorder: Secondary | ICD-10-CM | POA: Diagnosis not present

## 2015-03-11 DIAGNOSIS — R55 Syncope and collapse: Secondary | ICD-10-CM | POA: Diagnosis not present

## 2015-03-11 DIAGNOSIS — F0280 Dementia in other diseases classified elsewhere without behavioral disturbance: Secondary | ICD-10-CM | POA: Diagnosis not present

## 2015-03-11 DIAGNOSIS — M6281 Muscle weakness (generalized): Secondary | ICD-10-CM | POA: Diagnosis not present

## 2015-03-11 DIAGNOSIS — I5032 Chronic diastolic (congestive) heart failure: Secondary | ICD-10-CM | POA: Diagnosis not present

## 2015-03-11 DIAGNOSIS — K219 Gastro-esophageal reflux disease without esophagitis: Secondary | ICD-10-CM | POA: Diagnosis not present

## 2015-03-11 DIAGNOSIS — E559 Vitamin D deficiency, unspecified: Secondary | ICD-10-CM | POA: Diagnosis not present

## 2015-03-12 ENCOUNTER — Ambulatory Visit: Payer: Self-pay | Admitting: Internal Medicine

## 2015-03-19 ENCOUNTER — Ambulatory Visit (INDEPENDENT_AMBULATORY_CARE_PROVIDER_SITE_OTHER): Payer: Medicare Other | Admitting: Internal Medicine

## 2015-03-19 ENCOUNTER — Encounter: Payer: Self-pay | Admitting: Internal Medicine

## 2015-03-19 VITALS — BP 148/60 | HR 88 | Temp 97.7°F | Resp 20 | Ht 66.0 in | Wt 146.4 lb

## 2015-03-19 DIAGNOSIS — R739 Hyperglycemia, unspecified: Secondary | ICD-10-CM | POA: Diagnosis not present

## 2015-03-19 DIAGNOSIS — I10 Essential (primary) hypertension: Secondary | ICD-10-CM | POA: Diagnosis not present

## 2015-03-19 DIAGNOSIS — R5383 Other fatigue: Secondary | ICD-10-CM | POA: Diagnosis not present

## 2015-03-19 DIAGNOSIS — R059 Cough, unspecified: Secondary | ICD-10-CM

## 2015-03-19 DIAGNOSIS — R51 Headache: Secondary | ICD-10-CM

## 2015-03-19 DIAGNOSIS — Z23 Encounter for immunization: Secondary | ICD-10-CM | POA: Diagnosis not present

## 2015-03-19 DIAGNOSIS — R05 Cough: Secondary | ICD-10-CM | POA: Diagnosis not present

## 2015-03-19 DIAGNOSIS — G3184 Mild cognitive impairment, so stated: Secondary | ICD-10-CM | POA: Diagnosis not present

## 2015-03-19 DIAGNOSIS — R519 Headache, unspecified: Secondary | ICD-10-CM

## 2015-03-19 DIAGNOSIS — R269 Unspecified abnormalities of gait and mobility: Secondary | ICD-10-CM | POA: Diagnosis not present

## 2015-03-19 NOTE — Progress Notes (Signed)
Patient ID: Beth Foster, female   DOB: 08/01/1928, 81 y.o.   MRN: [id number]   Location: [place] Provider: Rexene Edison. Mariea Clonts, D.O., C.M.D.  Code Status: DNR Goals of Care: Advanced Directive information Does patient have an advance directive?: Yes, Would patient like information on creating an advanced directive?: No - patient declined information, Type of Advance Directive: [place], Does patient want to make changes to advanced directive?: No - Patient declined  Chief Complaint  Patient presents with  . Medical Management of Chronic Issues  . Hospitalization Follow-up    HPI: Patient is a 81 y.o. female seen in the office today for medical mgt of chronic diseases. Still with some headache where her head has bumps on it from her prior fall.    Says she's been coughing more.  Wheezes occasionally.  Not productive.    No more falls.  PT is done now.  Walking with her walker.  Reinforced again that she should use her walker.    Wants her blood pressure medications reduced.  BP at home was normal with her medications.  Always elevated here when she is anxious.  Says she can't function when she gets up b/c she does not feel good--nauseous and weak.  Says she thinks the drugstore gave her the wrong meds, but her granddaughter checked them and they were ok.  Pt says the pills were the wrong color--her granddaughter was not told this.    She is back to living by herself and has lost weight since.  Her family is keeping a close eye on her and she is also wearing a pendant for life alert.  Review of Systems:  Review of Systems  Constitutional: Positive for malaise/fatigue. Negative for fever and chills.  HENT: Negative for congestion.   Eyes: Negative for blurred vision.  Respiratory: Positive for cough. Negative for shortness of breath.   Cardiovascular: Negative for chest pain and leg swelling.  Gastrointestinal: Negative for abdominal pain.  Genitourinary:  Negative for dysuria, urgency and frequency.  Musculoskeletal: Negative for myalgias and falls.  Skin: Negative for rash.  Neurological: Positive for dizziness and weakness.       On standing and first thing in the morning  Psychiatric/Behavioral: Positive for memory loss.    Past Medical History  Diagnosis Date  . GERD (gastroesophageal reflux disease)   . Hypertension   . Pain in joint, pelvic region and thigh   . Vitamin D deficiency   . Viral hepatitis A without mention of hepatic coma   . Anxiety state, unspecified   . Unspecified cataract   . Unspecified late effects of cerebrovascular disease   . External hemorrhoids without mention of complication   . Contact dermatitis and other eczema due to other chemical products   . Bunion   . Rash and other nonspecific skin eruption   . Edema   . Viral hepatitis A without mention of hepatic coma   . Malignant neoplasm of breast (female), unspecified site dx'd 1996    rt breast; xrt   . Stroke [facility])     Past Surgical History  Procedure Laterality Date  . Cholecystectomy  1972  . Breast lumpectomy  1995  . Cardiac catheterization  2004    Allergies  Allergen Reactions  . Amoxicillin Anaphylaxis and Swelling  . Angiotensin Receptor Blockers Anaphylaxis, Itching and Rash    Tongue swelling  . Beta Adrenergic Blockers Anaphylaxis, Itching and Rash    Tongue swelling   .  Cephalexin Anaphylaxis    Tongue swelling  . Clindamycin/Lincomycin Anaphylaxis, Itching and Rash    Tongue swelling  . Eggs Or Egg-Derived Products Other (See Comments)    Blisters in mouth after eating for 3 days, mild reaction egg-derived vaccines  . Tape Other (See Comments)    Pulls skin off.  Please use "paper" tape.  . Corticosteroids Itching and Rash  . Hydrocortisone Itching and Rash  . Latex Rash  . Other Itching and Rash    Pine allergy, Fragrance free soaps and laundry products  . Reglan [Metoclopramide] Itching and Rash      Medication  List       This list is accurate as of: 03/19/15  8:27 AM.  Always use your most recent med list.               cholecalciferol 1000 UNITS tablet  Commonly known as:  VITAMIN D  Take 1,000 Units by mouth daily after lunch.     DILT-XR 240 MG 24 hr capsule  Generic drug:  diltiazem  TAKE 1 CAPSULE BY MOUTH DAILY     furosemide 20 MG tablet  Commonly known as:  LASIX  TAKE 1 TABLET BY MOUTH DAILY AS NEEDED FOR EDEMA     HALLS COUGH DROPS MT  Use as directed 1 lozenge in the mouth or throat 2 (two) times daily as needed (for cough).     polyethylene glycol packet  Commonly known as:  MIRALAX / GLYCOLAX  Take 17 g by mouth daily as needed for mild constipation.     sorbitol 70 % Soln  Take 30 mLs by mouth daily as needed for moderate constipation.     WOMENS MULTIVITAMIN PLUS Tabs  Take 1 tablet by mouth daily.        Health Maintenance  Topic Date Due  . ZOSTAVAX  10/23/1990  . DEXA SCAN  10/23/1995  . PNA vac Low Risk Adult (1 of 2 - PCV13) 10/23/1995  . INFLUENZA VACCINE  12/22/2014  . TETANUS/TDAP  01/09/2025    Physical Exam: Filed Vitals:   03/19/15 0700 03/19/15 0741  BP: 129/72 148/60  Pulse:  88  Temp:  97.7 F (36.5 C)  TempSrc:  Oral  Resp:  20  Height:  5\' 6"  (1.676 m)  Weight:  146 lb 6.4 oz (66.407 kg)  SpO2:  97%   Body mass index is 23.64 kg/(m^2). Physical Exam  Constitutional: She appears well-developed and well-nourished. No distress.  Cardiovascular: Normal rate, regular rhythm and normal heart sounds.   Pulmonary/Chest: Effort normal and breath sounds normal. No respiratory distress.  Abdominal: Soft. Bowel sounds are normal.  Musculoskeletal: Normal range of motion.  Slow gait with rollator walker  Neurological: She is alert.  Short term memory loss  Skin: Skin is warm and dry.  Some residual scar tissue and tenderness around right eye and forehead  Psychiatric: She has a normal mood and affect.    Labs reviewed: Basic  Metabolic Panel:  Recent Labs  11/28/14 0921 01/10/15 0722 01/12/15 0536 01/12/15 0926 01/13/15 0700  NA 142 139 139  --  139  K 4.2 3.6 3.2*  --  3.7  CL 104 105 106  --  107  CO2 22 26 24   --  25  GLUCOSE 109* 180* 97  --  109*  BUN 8 8 5*  --  <5*  CREATININE 0.59 0.67 0.58  --  0.54  CALCIUM 9.3 8.9 8.4*  --  9.1  MG  --   --  1.9  --   --   PHOS  --   --   --  2.1*  --   TSH 0.802  --   --   --   --    Liver Function Tests:  Recent Labs  11/28/14 0921  AST 17  ALT 14  ALKPHOS 59  BILITOT 0.5  PROT 7.0  ALBUMIN 4.3   No results for input(s): LIPASE, AMYLASE in the last 8760 hours. No results for input(s): AMMONIA in the last 8760 hours. CBC:  Recent Labs  11/28/14 0921 01/10/15 0722 01/12/15 0536  WBC 4.1 9.2 5.0  NEUTROABS 2.5  --   --   HGB  --  13.9 13.4  HCT 41.6 41.3 40.4  MCV  --  96.7 98.1  PLT  --  177 160   Lipid Panel:  Recent Labs  11/28/14 0921  CHOL 217*  HDL 96  LDLCALC 109*  TRIG 62  CHOLHDL 2.3   Lab Results  Component Value Date   HGBA1C 6.0* 01/10/2015     Assessment/Plan 1. Essential hypertension, benign -bp at goal for her--cannot tolerate lower bps -would not further decrease meds though either - CBC with Differential/Platelet  2. Frequent headaches - due to posttrauma, use tylenol as needed - CBC with Differential/Platelet  3. Mild cognitive impairment with memory loss -back to living independently--discussed safety, life alert and walker use, eating and drinking enough -her granddaughter is monitoring her closely  4. Gait disorder -must use rollator walker for stability and get up slowly in the am -must refill her vitamin D and take 2000 units daily    5. Other fatigue -chronic, but worse since fall -will check labs today to r/o anemia, renal failure, electrolyte abnormality contributing  6. Cough -dry, seems to be allergy-related/PND - encouraged hydration -lungs clear  7. Hyperglycemia - noted  on prior labs and during hospitalization - f/u labs: - Hemoglobin A1c - Comprehensive metabolic panel  8. Need for prophylactic vaccination and inoculation against influenza - Flu Vaccine QUAD 36+ mos PF IM (Fluarix & Fluzone Quad PF) given  Labs/tests ordered:   Orders Placed This Encounter  Procedures  . Flu Vaccine QUAD 36+ mos PF IM (Fluarix & Fluzone Quad PF)  . Hemoglobin A1c  . CBC with Differential/Platelet  . Comprehensive metabolic panel   Next appt:  3 mos med mgt  Ita Fritzsche L. Wei Poplaski, D.O. [facility] [address], [place] [address] Cell Phone (Mon-Fri 8am-5pm):  [phone] On Call:  [phone] & follow prompts after 5pm & weekends Office Phone:  [phone] Office Fax:  [phone]

## 2015-03-20 LAB — CBC WITH DIFFERENTIAL/PLATELET
Basophils Absolute: 0 10*3/uL (ref 0.0–0.2)
Basos: 0 %
EOS (ABSOLUTE): 0 10*3/uL (ref 0.0–0.4)
Eos: 0 %
Hematocrit: 42.2 % (ref 34.0–46.6)
Hemoglobin: 14.2 g/dL (ref 11.1–15.9)
Immature Grans (Abs): 0 10*3/uL (ref 0.0–0.1)
Immature Granulocytes: 0 %
Lymphocytes Absolute: 1.1 10*3/uL (ref 0.7–3.1)
Lymphs: 15 %
MCH: 32.7 pg (ref 26.6–33.0)
MCHC: 33.6 g/dL (ref 31.5–35.7)
MCV: 97 fL (ref 79–97)
Monocytes Absolute: 0.3 10*3/uL (ref 0.1–0.9)
Monocytes: 4 %
Neutrophils Absolute: 5.7 10*3/uL (ref 1.4–7.0)
Neutrophils: 81 %
Platelets: 239 10*3/uL (ref 150–379)
RBC: 4.34 x10E6/uL (ref 3.77–5.28)
RDW: 13.3 % (ref 12.3–15.4)
WBC: 7.1 10*3/uL (ref 3.4–10.8)

## 2015-03-20 LAB — HEMOGLOBIN A1C
Est. average glucose Bld gHb Est-mCnc: 126 mg/dL
Hgb A1c MFr Bld: 6 % — ABNORMAL HIGH (ref 4.8–5.6)

## 2015-03-20 LAB — COMPREHENSIVE METABOLIC PANEL
ALT: 14 IU/L (ref 0–32)
AST: 15 IU/L (ref 0–40)
Albumin/Globulin Ratio: 1.4 (ref 1.1–2.5)
Albumin: 4.3 g/dL (ref 3.5–4.7)
Alkaline Phosphatase: 76 IU/L (ref 39–117)
BUN/Creatinine Ratio: 15 (ref 11–26)
BUN: 11 mg/dL (ref 8–27)
Bilirubin Total: 0.4 mg/dL (ref 0.0–1.2)
CO2: 25 mmol/L (ref 18–29)
Calcium: 9.8 mg/dL (ref 8.7–10.3)
Chloride: 100 mmol/L (ref 97–106)
Creatinine, Ser: 0.72 mg/dL (ref 0.57–1.00)
GFR calc Af Amer: 89 mL/min/{1.73_m2} (ref >59)
GFR calc non Af Amer: 77 mL/min/{1.73_m2} (ref >59)
Globulin, Total: 3.1 g/dL (ref 1.5–4.5)
Glucose: 98 mg/dL (ref 65–99)
Potassium: 5.1 mmol/L (ref 3.5–5.2)
Sodium: 142 mmol/L (ref 136–144)
Total Protein: 7.4 g/dL (ref 6.0–8.5)

## 2015-04-09 ENCOUNTER — Ambulatory Visit: Payer: Medicare Other | Admitting: Internal Medicine

## 2015-04-15 ENCOUNTER — Ambulatory Visit (INDEPENDENT_AMBULATORY_CARE_PROVIDER_SITE_OTHER): Payer: Medicare Other | Admitting: Internal Medicine

## 2015-04-15 ENCOUNTER — Encounter: Payer: Self-pay | Admitting: Internal Medicine

## 2015-04-15 VITALS — BP 148/84 | HR 115 | Temp 97.8°F | Resp 16 | Ht 66.0 in | Wt 146.0 lb

## 2015-04-15 DIAGNOSIS — H5711 Ocular pain, right eye: Secondary | ICD-10-CM | POA: Diagnosis not present

## 2015-04-15 DIAGNOSIS — J301 Allergic rhinitis due to pollen: Secondary | ICD-10-CM

## 2015-04-15 DIAGNOSIS — K648 Other hemorrhoids: Secondary | ICD-10-CM | POA: Diagnosis not present

## 2015-04-15 DIAGNOSIS — K5900 Constipation, unspecified: Secondary | ICD-10-CM | POA: Diagnosis not present

## 2015-04-15 DIAGNOSIS — J309 Allergic rhinitis, unspecified: Secondary | ICD-10-CM | POA: Insufficient documentation

## 2015-04-15 DIAGNOSIS — K644 Residual hemorrhoidal skin tags: Secondary | ICD-10-CM

## 2015-04-15 NOTE — Patient Instructions (Addendum)
Recommend sit in sitz bath twice to reduce pain and swelling in rectal area.   May use Tucks pad OTC as needed for rectal pain  If no better with sitz bath in next few days, highly recommend GI or surgery eval for banding vs hemorrhoidectomy.  May take miralax daily for constipation along with colace (stool softener) daily  Continue other medications as ordered  Reassured patient regarding eye pain. May use warm compresses as needed to eye  Follow up with Dr Mariea Clonts as scheduled

## 2015-04-15 NOTE — Progress Notes (Signed)
Patient ID: Beth Foster, female   DOB: 16-Feb-1929, 81 y.o.   MRN: [id number]    Location:    [facility]   Place of Service:  OFFICE   Chief Complaint  Patient presents with  . Acute Visit    Patient with hemorrhoids (painful) and no BM x 2 weeks . Hemorrhoids is causing urinary and bowel issues. Here with grand-daughter Amy   . Eye Problem    Right eye is painful, onset this morning. Patient with history of injury to right eye related to fall      HPI:  81 yo female seen today for hemorrhoid pain and eye pain. She has 2 week hx painful hemorrhoids. Noticed [facility] last weekend. She has been straining to have BM and reports hard stools with big balls. No N/V, abdominal pain. She has increased urinary leakage with overflow sensation. Takes miralax daily. She has not started stool softener yet  Past Medical History  Diagnosis Date  . GERD (gastroesophageal reflux disease)   . Hypertension   . Pain in joint, pelvic region and thigh   . Vitamin D deficiency   . Viral hepatitis A without mention of hepatic coma   . Anxiety state, unspecified   . Unspecified cataract   . Unspecified late effects of cerebrovascular disease   . External hemorrhoids without mention of complication   . Contact dermatitis and other eczema due to other chemical products   . Bunion   . Rash and other nonspecific skin eruption   . Edema   . Viral hepatitis A without mention of hepatic coma   . Malignant neoplasm of breast (female), unspecified site dx'd 1996    rt breast; xrt   . Stroke [facility])     Past Surgical History  Procedure Laterality Date  . Cholecystectomy  1972  . Breast lumpectomy  1995  . Cardiac catheterization  2004    Patient Care Team: Gayland Curry, DO as PCP - General (Geriatric Medicine)  Social History   Social History  . Marital Status: Widowed    Spouse Name: N/A  . Number of Children: N/A  . Years of Education: N/A   Occupational History  . Not on file.   Social History Main  Topics  . Smoking status: Never Smoker   . Smokeless tobacco: Not on file  . Alcohol Use: No  . Drug Use: No  . Sexual Activity: No   Other Topics Concern  . Not on file   Social History Narrative     reports that she has never smoked. She does not have any smokeless tobacco history on file. She reports that she does not drink alcohol or use illicit drugs.  Allergies  Allergen Reactions  . Amoxicillin Anaphylaxis and Swelling  . Angiotensin Receptor Blockers Anaphylaxis, Itching and Rash    Tongue swelling  . Beta Adrenergic Blockers Anaphylaxis, Itching and Rash    Tongue swelling   . Cephalexin Anaphylaxis    Tongue swelling  . Clindamycin/Lincomycin Anaphylaxis, Itching and Rash    Tongue swelling  . Eggs Or Egg-Derived Products Other (See Comments)    Blisters in mouth after eating for 3 days, mild reaction egg-derived vaccines  . Tape Other (See Comments)    Pulls skin off.  Please use "paper" tape.  . Corticosteroids Itching and Rash  . Hydrocortisone Itching and Rash  . Latex Rash  . Other Itching and Rash    Pine allergy, Fragrance free soaps and laundry products  . Reglan [  Metoclopramide] Itching and Rash    Medications: Patient's Medications  New Prescriptions   No medications on file  Previous Medications   CHOLECALCIFEROL (VITAMIN D) 1000 UNITS TABLET    Take 1,000 Units by mouth daily after lunch.    DILT-XR 240 MG 24 HR CAPSULE    TAKE 1 CAPSULE BY MOUTH DAILY   FUROSEMIDE (LASIX) 20 MG TABLET    TAKE 1 TABLET BY MOUTH DAILY AS NEEDED FOR EDEMA   MENTHOL (HALLS COUGH DROPS MT)    Use as directed 1 lozenge in the mouth or throat 2 (two) times daily as needed (for cough).   MULTIPLE VITAMINS-MINERALS (WOMENS MULTIVITAMIN PLUS) TABS    Take 1 tablet by mouth daily.   POLYETHYLENE GLYCOL (MIRALAX / GLYCOLAX) PACKET    Take 17 g by mouth daily as needed for mild constipation.   PROBIOTIC PRODUCT (PROBIOTIC DAILY PO)    Take by mouth daily.  Modified  Medications   No medications on file  Discontinued Medications   SORBITOL 70 % SOLN    Take 30 mLs by mouth daily as needed for moderate constipation.    Review of Systems  Eyes: Positive for pain.  Gastrointestinal: Positive for constipation, blood in stool and rectal pain.  All other systems reviewed and are negative.   Filed Vitals:   04/15/15 0923  BP: 148/84  Pulse: 115  Temp: 97.8 F (36.6 C)  TempSrc: Oral  Resp: 16  Height: 5\' 6"  (1.676 m)  Weight: 146 lb (66.225 kg)  SpO2: 92%   Body mass index is 23.58 kg/(m^2).  Physical Exam  Constitutional: She is oriented to person, place, and time. She appears well-developed and well-nourished. No distress.  Eyes: Pupils are equal, round, and reactive to light. Right eye exhibits no discharge. Left eye exhibits no discharge. No scleral icterus.  Lower lid internal cobblestoning and red but no exudate  Genitourinary: Rectal exam shows external hemorrhoid, internal hemorrhoid and tenderness. Rectal exam shows no fissure, no mass and anal tone normal.    No vaginal discharge found.  No rectal prolapse  Neurological: She is alert and oriented to person, place, and time.  Skin: Skin is warm and dry. No rash noted.  Psychiatric: She has a normal mood and affect. Her behavior is normal.     Labs reviewed: Office Visit on 03/19/2015  Component Date Value Ref Range Status  . Hgb A1c MFr Bld 03/19/2015 6.0* 4.8 - 5.6 % Final   Comment:          Pre-diabetes: 5.7 - 6.4          Diabetes: >6.4          Glycemic control for adults with diabetes: <7.0   . Est. average glucose Bld gHb Est-m* 03/19/2015 126   Final  . WBC 03/19/2015 7.1  3.4 - 10.8 x10E3/uL Final  . RBC 03/19/2015 4.34  3.77 - 5.28 x10E6/uL Final  . Hemoglobin 03/19/2015 14.2  11.1 - 15.9 g/dL Final  . Hematocrit 03/19/2015 42.2  34.0 - 46.6 % Final  . MCV 03/19/2015 97  79 - 97 fL Final  . MCH 03/19/2015 32.7  26.6 - 33.0 pg Final  . MCHC 03/19/2015 33.6  31.5  - 35.7 g/dL Final  . RDW 03/19/2015 13.3  12.3 - 15.4 % Final  . Platelets 03/19/2015 239  150 - 379 x10E3/uL Final  . Neutrophils 03/19/2015 81   Final  . Lymphs 03/19/2015 15   Final  . Monocytes 03/19/2015  4   Final  . Eos 03/19/2015 0   Final  . Basos 03/19/2015 0   Final  . Neutrophils Absolute 03/19/2015 5.7  1.4 - 7.0 x10E3/uL Final  . Lymphocytes Absolute 03/19/2015 1.1  0.7 - 3.1 x10E3/uL Final  . Monocytes Absolute 03/19/2015 0.3  0.1 - 0.9 x10E3/uL Final  . EOS (ABSOLUTE) 03/19/2015 0.0  0.0 - 0.4 x10E3/uL Final  . Basophils Absolute 03/19/2015 0.0  0.0 - 0.2 x10E3/uL Final  . Immature Granulocytes 03/19/2015 0   Final  . Immature Grans (Abs) 03/19/2015 0.0  0.0 - 0.1 x10E3/uL Final  . Glucose 03/19/2015 98  65 - 99 mg/dL Final  . BUN 03/19/2015 11  8 - 27 mg/dL Final  . Creatinine, Ser 03/19/2015 0.72  0.57 - 1.00 mg/dL Final  . GFR calc non Af Amer 03/19/2015 77  >59 mL/min/1.73 Final  . GFR calc Af Amer 03/19/2015 89  >59 mL/min/1.73 Final  . BUN/Creatinine Ratio 03/19/2015 15  11 - 26 Final  . Sodium 03/19/2015 142  136 - 144 mmol/L Final                 **Please note reference interval change**  . Potassium 03/19/2015 5.1  3.5 - 5.2 mmol/L Final                 **Please note reference interval change**  . Chloride 03/19/2015 100  97 - 106 mmol/L Final                 **Please note reference interval change**  . CO2 03/19/2015 25  18 - 29 mmol/L Final  . Calcium 03/19/2015 9.8  8.7 - 10.3 mg/dL Final  . Total Protein 03/19/2015 7.4  6.0 - 8.5 g/dL Final  . Albumin 03/19/2015 4.3  3.5 - 4.7 g/dL Final  . Globulin, Total 03/19/2015 3.1  1.5 - 4.5 g/dL Final  . Albumin/Globulin Ratio 03/19/2015 1.4  1.1 - 2.5 Final  . Bilirubin Total 03/19/2015 0.4  0.0 - 1.2 mg/dL Final  . Alkaline Phosphatase 03/19/2015 76  39 - 117 IU/L Final  . AST 03/19/2015 15  0 - 40 IU/L Final  . ALT 03/19/2015 14  0 - 32 IU/L Final    No results found.   Assessment/Plan   ICD-9-CM  ICD-10-CM   1. External hemorrhoids without complication Q000111Q 0000000   2. Eye pain, right 379.91 H57.11   3. Allergic rhinitis due to pollen 477.0 J30.1   4.......constipation  Recommend sit in sitz bath twice to reduce pain and swelling in rectal area. She has an allergy to corticosteroids (itching, rash)  May use Tucks pad OTC as needed for rectal pain  If no better with sitz bath in next few days, highly recommend GI or surgery eval for banding vs hemorrhoidectomy.  May take miralax daily for constipation along with colace (stool softener) daily  Continue other medications as ordered  Reassured patient regarding eye pain. May use warm compresses as needed to eye  Follow up with Dr Mariea Clonts as scheduled  Cordella Register. Perlie Gold  [facility] [address] [place], [place] [address] [phone] Cell (Monday-Friday 8 AM - 5 PM) [phone] After 5 PM and follow prompts

## 2015-06-09 ENCOUNTER — Encounter: Payer: Self-pay | Admitting: Internal Medicine

## 2015-06-09 ENCOUNTER — Ambulatory Visit (INDEPENDENT_AMBULATORY_CARE_PROVIDER_SITE_OTHER): Payer: Medicare Other | Admitting: Internal Medicine

## 2015-06-09 VITALS — BP 140/80 | HR 88 | Temp 98.0°F | Resp 20 | Ht 66.0 in | Wt 148.8 lb

## 2015-06-09 DIAGNOSIS — R0600 Dyspnea, unspecified: Secondary | ICD-10-CM | POA: Diagnosis not present

## 2015-06-09 DIAGNOSIS — R05 Cough: Secondary | ICD-10-CM

## 2015-06-09 DIAGNOSIS — J209 Acute bronchitis, unspecified: Secondary | ICD-10-CM | POA: Diagnosis not present

## 2015-06-09 DIAGNOSIS — G3184 Mild cognitive impairment, so stated: Secondary | ICD-10-CM | POA: Diagnosis not present

## 2015-06-09 DIAGNOSIS — R059 Cough, unspecified: Secondary | ICD-10-CM | POA: Insufficient documentation

## 2015-06-09 DIAGNOSIS — R5383 Other fatigue: Secondary | ICD-10-CM | POA: Diagnosis not present

## 2015-06-09 MED ORDER — DOXYCYCLINE HYCLATE 100 MG PO TABS: 100.0000 mg | ORAL_TABLET | Freq: Two times a day (BID) | ORAL | Status: DC

## 2015-06-09 NOTE — Patient Instructions (Signed)
Delsym may help cough

## 2015-06-09 NOTE — Progress Notes (Signed)
Patient ID: Beth Foster, female   DOB: October 25, 1928, 82 y.o.   MRN: [id number]    Facility  [facility]    Place of Service:   OFFICE    Allergies  Allergen Reactions  . Amoxicillin Anaphylaxis and Swelling  . Angiotensin Receptor Blockers Anaphylaxis, Itching and Rash    Tongue swelling  . Beta Adrenergic Blockers Anaphylaxis, Itching and Rash    Tongue swelling   . Cephalexin Anaphylaxis    Tongue swelling  . Clindamycin/Lincomycin Anaphylaxis, Itching and Rash    Tongue swelling  . Eggs Or Egg-Derived Products Other (See Comments)    Blisters in mouth after eating for 3 days, mild reaction egg-derived vaccines  . Tape Other (See Comments)    Pulls skin off.  Please use "paper" tape.  . Corticosteroids Itching and Rash  . Hydrocortisone Itching and Rash  . Latex Rash  . Other Itching and Rash    Pine allergy, Fragrance free soaps and laundry products  . Reglan [Metoclopramide] Itching and Rash    Chief Complaint  Patient presents with  . Acute Visit    SOB when walking, chest hurts when she coughs, dizziness  comes and goes,     HPI:  Elderly patient that sees Dr. Mariea Clonts usually, but last saw Dr. Eulas Post on 04/15/2015 for her hemorrhoids.  Somewhat confusing story with components of both long-term issues as well as more recent onset of increases in dyspnea and chest discomfort.  Patient states that she has problems with shortness breath on exertion for months if not a year or more. This is been accompanied by persistent chronic cough that is nonproductive. About 2 weeks ago symptoms seem to increase. She is now having discomfort in the lower anterior chest with deep breathing. Her cough persists and remains nonproductive. Although she feels warm, by her history, there has been no attempt to check her temperature to see if she had a fever. Patient was hospitalized in August 2016 with a syncopal episode. Family states that she was told at that point that she may have had a little  pneumonia. Patient and family were concerned that she might be having a pneumonia start again. They wanted to "catch this early". Review of the x-ray from January 10, 2015 shows minimal patchy atelectasis versus airspace disease at the lateral left base.  Current symptoms seem to start with a sore throat, head congestion, and then an increase in the cough and anterior chest discomfort with deep breathing. There is also a posterior nasal drip.  He complains of dizziness which comes and goes. She has the dizziness when sitting as well as standing.  Medications: Patient's Medications  New Prescriptions   No medications on file  Previous Medications   CHOLECALCIFEROL (VITAMIN D) 1000 UNITS TABLET    Take 1,000 Units by mouth daily after lunch.    DILT-XR 240 MG 24 HR CAPSULE    TAKE 1 CAPSULE BY MOUTH DAILY   FUROSEMIDE (LASIX) 20 MG TABLET    TAKE 1 TABLET BY MOUTH DAILY AS NEEDED FOR EDEMA   MENTHOL (HALLS COUGH DROPS MT)    Use as directed 1 lozenge in the mouth or throat 2 (two) times daily as needed (for cough).   MULTIPLE VITAMINS-MINERALS (WOMENS MULTIVITAMIN PLUS) TABS    Take 1 tablet by mouth daily.   POLYETHYLENE GLYCOL (MIRALAX / GLYCOLAX) PACKET    Take 17 g by mouth daily as needed for mild constipation.   PROBIOTIC PRODUCT (PROBIOTIC DAILY PO)  Take by mouth daily.  Modified Medications   No medications on file  Discontinued Medications   No medications on file    Review of Systems  Constitutional: Positive for activity change and fatigue. Negative for fever and chills.  HENT: Positive for postnasal drip and sore throat. Negative for congestion, ear pain, mouth sores, sinus pressure, trouble swallowing and voice change.   Eyes: Negative.   Respiratory: Positive for cough and shortness of breath. Negative for wheezing.   Cardiovascular: Positive for chest pain (Anterior lower right chest). Negative for palpitations and leg swelling.  Gastrointestinal: Negative for nausea,  abdominal pain, diarrhea and blood in stool.  Endocrine: Negative.   Genitourinary: Negative for dysuria, urgency and frequency.  Musculoskeletal: Negative for myalgias.  Skin: Negative.  Negative for rash.  Neurological: Positive for dizziness and weakness.       On standing and first thing in the morning  Psychiatric/Behavioral: Negative.     Filed Vitals:   06/09/15 1553  BP: 140/80  Pulse: 88  Temp: 98 F (36.7 C)  TempSrc: Oral  Resp: 20  Height: '5\' 6"'$  (1.676 m)  Weight: 148 lb 12.8 oz (67.495 kg)  SpO2: 98%   Body mass index is 24.03 kg/(m^2). Filed Weights   06/09/15 1553  Weight: 148 lb 12.8 oz (67.495 kg)     Physical Exam  Constitutional: She is oriented to person, place, and time. She appears well-developed and well-nourished. No distress.  HENT:  Nose: Nose normal.  Mouth/Throat: Oropharynx is clear and moist. No oropharyngeal exudate.  Eyes: Conjunctivae and EOM are normal. Pupils are equal, round, and reactive to light. Right eye exhibits no discharge. Left eye exhibits no discharge.  Neck: No JVD present. No tracheal deviation present. No thyromegaly present.  Cardiovascular: Normal rate, regular rhythm and normal heart sounds.   Pulmonary/Chest: Effort normal and breath sounds normal. No respiratory distress. She has no wheezes. She has no rales. She exhibits no tenderness.  Abdominal: Soft. Bowel sounds are normal. She exhibits no distension. There is no tenderness. There is no rebound and no guarding.  Musculoskeletal: Normal range of motion. She exhibits no edema or tenderness.  Slow gait with rollator walker  Lymphadenopathy:    She has cervical adenopathy (possible mild enlargement of left mid anterior neck lymph node).  Neurological: She is alert and oriented to person, place, and time. Coordination normal.  Short term memory loss  Skin: Skin is warm and dry. No rash noted. No erythema. No pallor.  Some residual scar tissue and tenderness around  right eye and forehead  Psychiatric: She has a normal mood and affect. Her behavior is normal. Thought content normal.    Labs reviewed: Lab Summary Latest Ref Rng 03/19/2015 01/13/2015 01/12/2015 01/10/2015  Hemoglobin 11.1 - 15.9 g/dL 14.2 (None) 13.4 13.9  Hematocrit 34.0 - 46.6 % 42.2 (None) 40.4 41.3  White count 3.4 - 10.8 x10E3/uL 7.1 (None) 5.0 9.2  Platelet count 150 - 379 x10E3/uL 239 (None) 160 177  Sodium 136 - 144 mmol/L 142 139 139 139  Potassium 3.5 - 5.2 mmol/L 5.1 3.7 3.2(L) 3.6  Calcium 8.7 - 10.3 mg/dL 9.8 9.1 8.4(L) 8.9  Phosphorus 2.5 - 4.6 mg/dL (None) (None) 2.1(L) (None)  Creatinine 0.57 - 1.00 mg/dL 0.72 0.54 0.58 0.67  AST 0 - 40 IU/L 15 (None) (None) (None)  Alk Phos 39 - 117 IU/L 76 (None) (None) (None)  Bilirubin 0.0 - 1.2 mg/dL 0.4 (None) (None) (None)  Glucose 65 - 99 mg/dL  98 109(H) 97 180(H)  Cholesterol - (None) (None) (None) (None)  HDL cholesterol - (None) (None) (None) (None)  Triglycerides - (None) (None) (None) (None)  LDL Direct - (None) (None) (None) (None)  LDL Calc - (None) (None) (None) (None)  Total protein - (None) (None) (None) (None)  Albumin 3.5 - 4.7 g/dL 4.3 (None) (None) (None)   Lab Results  Component Value Date   TSH 0.802 11/28/2014   Lab Results  Component Value Date   BUN 11 03/19/2015   BUN <5* 01/13/2015   BUN 5* 01/12/2015   Lab Results  Component Value Date   HGBA1C 6.0* 03/19/2015   HGBA1C 6.0* 01/10/2015    Assessment/Plan  1. Cough Chronic and more prominent recently  2. Dyspnea Chronic and with an acute component of increasing shortness of breath recently  3. Mild cognitive impairment with memory loss - Comprehensive metabolic panel; Future  4. Acute bronchitis, unspecified organism - CBC With Differential; Future - DG Chest 2 View; Future - doxycycline (VIBRA-TABS) 100 MG tablet; Take 1 tablet (100 mg total) by mouth 2 (two) times daily.  Dispense: 20 tablet; Refill: 0  5. Other fatigue - TSH;  Future

## 2015-06-10 ENCOUNTER — Other Ambulatory Visit: Payer: Medicare Other

## 2015-06-10 DIAGNOSIS — R5383 Other fatigue: Secondary | ICD-10-CM | POA: Diagnosis not present

## 2015-06-10 DIAGNOSIS — G3184 Mild cognitive impairment, so stated: Secondary | ICD-10-CM | POA: Diagnosis not present

## 2015-06-10 DIAGNOSIS — J209 Acute bronchitis, unspecified: Secondary | ICD-10-CM

## 2015-06-11 ENCOUNTER — Ambulatory Visit
Admission: RE | Admit: 2015-06-11 | Discharge: 2015-06-11 | Disposition: A | Discharge status: Home / Self Care | Payer: Medicare Other | Source: Ambulatory Visit | Attending: Internal Medicine | Admitting: Internal Medicine

## 2015-06-11 DIAGNOSIS — J209 Acute bronchitis, unspecified: Secondary | ICD-10-CM

## 2015-06-11 DIAGNOSIS — R05 Cough: Secondary | ICD-10-CM | POA: Diagnosis not present

## 2015-06-11 LAB — CBC WITH DIFFERENTIAL
Basophils Absolute: 0 10*3/uL (ref 0.0–0.2)
Basos: 0 %
EOS (ABSOLUTE): 0.1 10*3/uL (ref 0.0–0.4)
Eos: 1 %
Hematocrit: 39.7 % (ref 34.0–46.6)
Hemoglobin: 13.4 g/dL (ref 11.1–15.9)
Immature Grans (Abs): 0 10*3/uL (ref 0.0–0.1)
Immature Granulocytes: 0 %
Lymphocytes Absolute: 1.6 10*3/uL (ref 0.7–3.1)
Lymphs: 33 %
MCH: 32.1 pg (ref 26.6–33.0)
MCHC: 33.8 g/dL (ref 31.5–35.7)
MCV: 95 fL (ref 79–97)
Monocytes Absolute: 0.4 10*3/uL (ref 0.1–0.9)
Monocytes: 9 %
Neutrophils Absolute: 2.8 10*3/uL (ref 1.4–7.0)
Neutrophils: 57 %
RBC: 4.18 x10E6/uL (ref 3.77–5.28)
RDW: 13.6 % (ref 12.3–15.4)
WBC: 4.9 10*3/uL (ref 3.4–10.8)

## 2015-06-11 LAB — COMPREHENSIVE METABOLIC PANEL
ALT: 20 IU/L (ref 0–32)
AST: 27 IU/L (ref 0–40)
Albumin/Globulin Ratio: 1.4 (ref 1.1–2.5)
Albumin: 4.1 g/dL (ref 3.5–4.7)
Alkaline Phosphatase: 67 IU/L (ref 39–117)
BUN/Creatinine Ratio: 16 (ref 11–26)
BUN: 11 mg/dL (ref 8–27)
Bilirubin Total: 0.3 mg/dL (ref 0.0–1.2)
CO2: 25 mmol/L (ref 18–29)
Calcium: 9.7 mg/dL (ref 8.7–10.3)
Chloride: 101 mmol/L (ref 96–106)
Creatinine, Ser: 0.69 mg/dL (ref 0.57–1.00)
GFR calc Af Amer: 92 mL/min/{1.73_m2} (ref >59)
GFR calc non Af Amer: 80 mL/min/{1.73_m2} (ref >59)
Globulin, Total: 2.9 g/dL (ref 1.5–4.5)
Glucose: 110 mg/dL — ABNORMAL HIGH (ref 65–99)
Potassium: 4.1 mmol/L (ref 3.5–5.2)
Sodium: 143 mmol/L (ref 134–144)
Total Protein: 7 g/dL (ref 6.0–8.5)

## 2015-06-11 LAB — TSH: TSH: 0.982 u[IU]/mL (ref 0.450–4.500)

## 2015-06-19 ENCOUNTER — Other Ambulatory Visit: Payer: Self-pay | Admitting: Internal Medicine

## 2015-06-22 ENCOUNTER — Ambulatory Visit: Payer: Medicare Other | Admitting: Internal Medicine

## 2015-06-22 DIAGNOSIS — Z0289 Encounter for other administrative examinations: Secondary | ICD-10-CM

## 2015-10-16 ENCOUNTER — Other Ambulatory Visit: Payer: Self-pay | Admitting: Internal Medicine

## 2015-11-14 DIAGNOSIS — H353290 Exudative age-related macular degeneration, unspecified eye, stage unspecified: Secondary | ICD-10-CM | POA: Diagnosis not present

## 2015-11-19 ENCOUNTER — Other Ambulatory Visit: Payer: Self-pay | Admitting: Internal Medicine

## 2015-12-17 ENCOUNTER — Other Ambulatory Visit: Payer: Self-pay | Admitting: Internal Medicine

## 2015-12-19 ENCOUNTER — Emergency Department (HOSPITAL_COMMUNITY)
Admission: EM | Admit: 2015-12-19 | Discharge: 2015-12-19 | Disposition: A | Discharge status: Home / Self Care | Payer: Medicare Other | Attending: Emergency Medicine | Admitting: Emergency Medicine

## 2015-12-19 ENCOUNTER — Encounter (HOSPITAL_COMMUNITY): Payer: Self-pay | Admitting: *Deleted

## 2015-12-19 DIAGNOSIS — R600 Localized edema: Secondary | ICD-10-CM

## 2015-12-19 DIAGNOSIS — Z8673 Personal history of transient ischemic attack (TIA), and cerebral infarction without residual deficits: Secondary | ICD-10-CM | POA: Diagnosis not present

## 2015-12-19 DIAGNOSIS — Z79899 Other long term (current) drug therapy: Secondary | ICD-10-CM | POA: Diagnosis not present

## 2015-12-19 DIAGNOSIS — Z853 Personal history of malignant neoplasm of breast: Secondary | ICD-10-CM | POA: Insufficient documentation

## 2015-12-19 DIAGNOSIS — I10 Essential (primary) hypertension: Secondary | ICD-10-CM | POA: Insufficient documentation

## 2015-12-19 DIAGNOSIS — E785 Hyperlipidemia, unspecified: Secondary | ICD-10-CM | POA: Diagnosis not present

## 2015-12-19 DIAGNOSIS — M7989 Other specified soft tissue disorders: Secondary | ICD-10-CM | POA: Diagnosis present

## 2015-12-19 LAB — CBC WITH DIFFERENTIAL/PLATELET
Basophils Absolute: 0 10*3/uL (ref 0.0–0.1)
Basophils Relative: 1 %
Eosinophils Absolute: 0.1 10*3/uL (ref 0.0–0.7)
Eosinophils Relative: 2 %
HCT: 41.3 % (ref 36.0–46.0)
Hemoglobin: 13.9 g/dL (ref 12.0–15.0)
Lymphocytes Relative: 34 %
Lymphs Abs: 1.6 10*3/uL (ref 0.7–4.0)
MCH: 32.6 pg (ref 26.0–34.0)
MCHC: 33.7 g/dL (ref 30.0–36.0)
MCV: 96.9 fL (ref 78.0–100.0)
Monocytes Absolute: 0.6 10*3/uL (ref 0.1–1.0)
Monocytes Relative: 12 %
Neutro Abs: 2.5 10*3/uL (ref 1.7–7.7)
Neutrophils Relative %: 51 %
Platelets: 183 10*3/uL (ref 150–400)
RBC: 4.26 MIL/uL (ref 3.87–5.11)
RDW: 12.9 % (ref 11.5–15.5)
WBC: 4.9 10*3/uL (ref 4.0–10.5)

## 2015-12-19 LAB — D-DIMER, QUANTITATIVE: D-Dimer, Quant: 0.44 ug/mL-FEU (ref 0.00–0.50)

## 2015-12-19 LAB — BASIC METABOLIC PANEL
Anion gap: 9 (ref 5–15)
BUN: 11 mg/dL (ref 6–20)
CO2: 23 mmol/L (ref 22–32)
Calcium: 9.1 mg/dL (ref 8.9–10.3)
Chloride: 105 mmol/L (ref 101–111)
Creatinine, Ser: 0.58 mg/dL (ref 0.44–1.00)
GFR calc Af Amer: >60 mL/min (ref >60)
GFR calc non Af Amer: >60 mL/min (ref >60)
Glucose, Bld: 106 mg/dL — ABNORMAL HIGH (ref 65–99)
Potassium: 3.6 mmol/L (ref 3.5–5.1)
Sodium: 137 mmol/L (ref 135–145)

## 2015-12-19 NOTE — ED Triage Notes (Signed)
Pt complains of left lower leg swelling for the past week. Pt took lasix for 3 days, starting Monday, and states there was no improvement in the swelling. Pt denies pain or injury to left lower leg.

## 2015-12-19 NOTE — ED Provider Notes (Signed)
[facility] DEPT Provider Note   CSN: [id number] Arrival date & time: 12/19/15  1831  First Provider Contact:  First MD Initiated Contact with Patient 12/19/15 2056        History   Chief Complaint Chief Complaint  Patient presents with  . Leg Swelling    HPI Beth Foster is a 82 y.o. female.  The history is provided by the patient. No language interpreter was used.   Beth Foster is a 82 y.o. female who presents to the Emergency Department complaining of leg swelling.  She reports 1 week of left leg swelling. No reports of injuries. She tried her home Lasix without any improvement in her symptoms. She does have chronic shortness of breath since last August, unchanged from baseline. No reports of fever, cough, chest pain, syncope, lightheadedness. Symptoms are mild and constant nature.  Past Medical History:  Diagnosis Date  . Anxiety state, unspecified   . Bunion   . Contact dermatitis and other eczema due to other chemical products   . Edema   . External hemorrhoids without mention of complication   . GERD (gastroesophageal reflux disease)   . Hypertension   . Malignant neoplasm of breast (female), unspecified site dx'd 1996   rt breast; xrt   . Pain in joint, pelvic region and thigh   . Rash and other nonspecific skin eruption   . Stroke ([facility])   . Unspecified cataract   . Unspecified late effects of cerebrovascular disease   . Viral hepatitis A without mention of hepatic coma   . Viral hepatitis A without mention of hepatic coma   . Vitamin D deficiency     Patient Active Problem List   Diagnosis Date Noted  . Cough 06/09/2015  . Dyspnea 06/09/2015  . Acute bronchitis 06/09/2015  . Allergic rhinitis 04/15/2015  . Syncope and collapse 01/10/2015  . Contusion of left supra orbit 01/10/2015  . Laceration of right eyebrow 01/10/2015  . DOE (dyspnea on exertion) 01/10/2015  . Exertional chest pain 01/10/2015  . Fatigue 01/10/2015  . Acute hyperglycemia  01/10/2015  . Hemorrhoids 10/28/2013  . Vitamin D deficiency 10/28/2013  . Painful lumpy right breast 12/06/2012  . HLD (hyperlipidemia) 12/06/2012  . Essential hypertension, benign 12/06/2012  . Malignant neoplasm of breast (female), unspecified site   . Mild cognitive impairment with memory loss 08/20/2012  . Gait disorder 08/20/2012  . Wound, open, head 08/20/2012  . GERD (gastroesophageal reflux disease) 08/20/2012    Past Surgical History:  Procedure Laterality Date  . BREAST LUMPECTOMY  1995  . CARDIAC CATHETERIZATION  2004  . CHOLECYSTECTOMY  1972    OB History    No data available       Home Medications    Prior to Admission medications   Medication Sig Start Date End Date Taking? Authorizing Provider  cholecalciferol (VITAMIN D) 1000 UNITS tablet Take 1,000 Units by mouth daily.    Yes Historical Provider, MD  diltiazem (DILACOR XR) 240 MG 24 hr capsule Take 240 mg by mouth at bedtime.   Yes Historical Provider, MD  furosemide (LASIX) 20 MG tablet Take 20 mg by mouth daily as needed for edema.   Yes Historical Provider, MD  Multiple Vitamin (MULTIVITAMIN WITH MINERALS) TABS tablet Take 1 tablet by mouth daily.   Yes Historical Provider, MD    Family History Family History  Problem Relation Age of Onset  . Cancer Mother     lung  . Cancer Brother  brain    Social History Social History  Substance Use Topics  . Smoking status: Never Smoker  . Smokeless tobacco: Never Used  . Alcohol use No     Allergies   Amoxicillin; Angiotensin receptor blockers; Beta adrenergic blockers; Cephalexin; Clindamycin/lincomycin; Eggs or egg-derived products; Tape; Corticosteroids; Hydrocortisone; Latex; Other; and Reglan [metoclopramide]   Review of Systems Review of Systems  All other systems reviewed and are negative.    Physical Exam Updated Vital Signs BP 176/94 (BP Location: Right Arm)   Pulse 94   Temp 98.4 F (36.9 C) (Oral)   Resp 18   SpO2 94%    Physical Exam  Constitutional: She is oriented to person, place, and time. She appears well-developed and well-nourished.  HENT:  Head: Normocephalic and atraumatic.  Cardiovascular: Normal rate and regular rhythm.   No murmur heard. Pulmonary/Chest: Effort normal and breath sounds normal. No respiratory distress.  Abdominal: Soft. There is no tenderness. There is no rebound and no guarding.  Musculoskeletal:  1+ edema to LLE with TTP to left calf.    Neurological: She is alert and oriented to person, place, and time.  Skin: Skin is warm and dry.  Psychiatric: She has a normal mood and affect. Her behavior is normal.  Nursing note and vitals reviewed.    ED Treatments / Results  Labs (all labs ordered are listed, but only abnormal results are displayed) Labs Reviewed  BASIC METABOLIC PANEL  CBC WITH DIFFERENTIAL/PLATELET  D-DIMER, QUANTITATIVE (NOT AT [facility])    EKG  EKG Interpretation None       Radiology No results found.  Procedures Procedures (including critical care time)  Medications Ordered in ED Medications - No data to display   Initial Impression / Assessment and Plan / ED Course  I have reviewed the triage vital signs and the nursing notes.  Pertinent labs & imaging results that were available during my care of the patient were reviewed by me and considered in my medical decision making (see chart for details).  Clinical Course    Patient here for evaluation of left lower extremity pain. Her pain and swelling usually resolve after Lasix and she has been taking for the last 3 days. She chronically only has edema in the left leg. D-dimer is negative, DVT unlikely. She is well perfused on examination with no evidence of acute infection. Discussed with patient health care for leg edema with compressive stockings, elevation.  Discussed outpatient follow-up as well as return precautions.  Final Clinical Impressions(s) / ED Diagnoses   Final diagnoses:   Edema of left lower extremity    New Prescriptions New Prescriptions   No medications on file     Quintella Reichert, MD 12/19/15 2337

## 2015-12-22 ENCOUNTER — Other Ambulatory Visit: Payer: Self-pay | Admitting: Internal Medicine

## 2015-12-22 ENCOUNTER — Encounter: Payer: Self-pay | Admitting: Podiatry

## 2015-12-22 ENCOUNTER — Ambulatory Visit (INDEPENDENT_AMBULATORY_CARE_PROVIDER_SITE_OTHER): Payer: Medicare Other | Admitting: Podiatry

## 2015-12-22 VITALS — BP 142/88 | HR 88 | Resp 12

## 2015-12-22 DIAGNOSIS — M79676 Pain in unspecified toe(s): Secondary | ICD-10-CM

## 2015-12-22 DIAGNOSIS — B351 Tinea unguium: Secondary | ICD-10-CM | POA: Diagnosis not present

## 2015-12-22 NOTE — Progress Notes (Signed)
   Subjective:    Patient ID: Beth Foster, female    DOB: February 17, 1929, 82 y.o.   MRN: [id number]  HPI: She presents today as a new patient with a chief complaint of painful elongated toenails. She states that she can no longer reach them and they're too thick for her to cut.    Review of Systems  Skin: Positive for color change.       Objective:   Physical Exam: Vital signs are stable she is alert and oriented 3. Pulses are palpable. Neurologic sensorium is intact deep tendon reflexes are intact muscle strength is normal bilateral. Toenails are thick yellow dystrophic onychomycotic and painful on palpation.        Assessment & Plan:  Pain in limb secondary to onychomycosis.  Plan: Debridement of toenails 1 through 5 bilateral. Will follow up with her as needed.

## 2016-01-01 ENCOUNTER — Encounter: Payer: Self-pay | Admitting: Internal Medicine

## 2016-01-01 ENCOUNTER — Ambulatory Visit (INDEPENDENT_AMBULATORY_CARE_PROVIDER_SITE_OTHER): Payer: Medicare Other | Admitting: Internal Medicine

## 2016-01-01 VITALS — BP 132/70 | HR 86 | Temp 97.9°F | Wt 151.0 lb

## 2016-01-01 DIAGNOSIS — R590 Localized enlarged lymph nodes: Secondary | ICD-10-CM

## 2016-01-01 DIAGNOSIS — G3184 Mild cognitive impairment, so stated: Secondary | ICD-10-CM

## 2016-01-01 DIAGNOSIS — I10 Essential (primary) hypertension: Secondary | ICD-10-CM | POA: Diagnosis not present

## 2016-01-01 DIAGNOSIS — I872 Venous insufficiency (chronic) (peripheral): Secondary | ICD-10-CM

## 2016-01-01 DIAGNOSIS — R599 Enlarged lymph nodes, unspecified: Secondary | ICD-10-CM | POA: Diagnosis not present

## 2016-01-01 DIAGNOSIS — R269 Unspecified abnormalities of gait and mobility: Secondary | ICD-10-CM

## 2016-01-01 LAB — CBC WITH DIFFERENTIAL/PLATELET
Basophils Absolute: 0 cells/uL (ref 0–200)
Basophils Relative: 0 %
Eosinophils Absolute: 50 cells/uL (ref 15–500)
Eosinophils Relative: 1 %
HCT: 40 % (ref 35.0–45.0)
Hemoglobin: 13.6 g/dL (ref 11.7–15.5)
Lymphocytes Relative: 32 %
Lymphs Abs: 1600 cells/uL (ref 850–3900)
MCH: 31.9 pg (ref 27.0–33.0)
MCHC: 34 g/dL (ref 32.0–36.0)
MCV: 93.7 fL (ref 80.0–100.0)
MPV: 10.5 fL (ref 7.5–12.5)
Monocytes Absolute: 400 cells/uL (ref 200–950)
Monocytes Relative: 8 %
Neutro Abs: 2950 cells/uL (ref 1500–7800)
Neutrophils Relative %: 59 %
Platelets: 216 10*3/uL (ref 140–400)
RBC: 4.27 MIL/uL (ref 3.80–5.10)
RDW: 12.8 % (ref 11.0–15.0)
WBC: 5 10*3/uL (ref 3.8–10.8)

## 2016-01-01 NOTE — Progress Notes (Signed)
Location:  [facility] clinic Provider: Eligh Rybacki L. Mariea Clonts, D.O., C.M.D.  Code Status: DNR Goals of Care:  Advanced Directives 01/01/2016  Does patient have an advance directive? Yes  Type of Advance Directive [place]  Does patient want to make changes to advanced directive? -  Copy of advanced directive(s) in chart? Yes  Would patient like information on creating an advanced directive? -  Pre-existing out of facility DNR order (yellow form or pink MOST form) -    Chief Complaint  Patient presents with  . Acute Visit    left sided neck pain and swollen mass present    HPI: Patient is a 82 y.o. Foster seen today for an acute visit for left sided neck pain and swollen mass present.  She is afebrile.  Thinks it started within the week.  Worse two days ago.  Bothersome.  Thought other side was starting also at first.  Has not lost weight.  Stamina is decreased. She is edentulous (wears dentures).  No cold symptoms.    Went to ER for swelling of left ankle--compression sock recommended.  No change to lasix.  D dimer was negative.  Compression sock did help.  Only wearing them for a few hours and taking them off due to pain.  Thinks they are the wrong size.    Went to podiatry to get nails cut.  Had slight rash on the left ankle.  Vein not as good on that side.    Past Medical History:  Diagnosis Date  . Anxiety state, unspecified   . Bunion   . Contact dermatitis and other eczema due to other chemical products   . Edema   . External hemorrhoids without mention of complication   . GERD (gastroesophageal reflux disease)   . Hypertension   . Malignant neoplasm of breast ([place]), unspecified site dx'd 1996   rt breast; xrt   . Pain in joint, pelvic region and thigh   . Rash and other nonspecific skin eruption   . Stroke ([place])   . Unspecified cataract   . Unspecified late effects of cerebrovascular disease   . Viral hepatitis A without mention of hepatic coma   . Viral  hepatitis A without mention of hepatic coma   . Vitamin D deficiency     Past Surgical History:  Procedure Laterality Date  . BREAST LUMPECTOMY  1995  . CARDIAC CATHETERIZATION  2004  . CHOLECYSTECTOMY  1972    Allergies  Allergen Reactions  . Amoxicillin Anaphylaxis and Other (See Comments)    Has patient had a PCN reaction causing immediate rash, facial/tongue/throat swelling, SOB or lightheadedness with hypotension: Yes Has patient had a PCN reaction causing severe rash involving mucus membranes or skin necrosis: No Has patient had a PCN reaction that required hospitalization No Has patient had a PCN reaction occurring within the last 10 years: No If all of the above answers are "NO", then may proceed with Cephalosporin use.  . Angiotensin Receptor Blockers Anaphylaxis, Itching and Rash  . Beta Adrenergic Blockers Anaphylaxis, Itching and Rash  . Cephalexin Anaphylaxis  . Clindamycin/Lincomycin Anaphylaxis, Itching and Rash  . Eggs Or Egg-Derived Products Other (See Comments)    Reaction:  Blisters in mouth   . Tape Other (See Comments)    Reaction:  Pulls skin off   . Corticosteroids Itching and Rash  . Hydrocortisone Itching and Rash  . Latex Itching and Rash  . Other Itching, Rash and Other (See Comments)  Pt states that she has a pine allergy and she is only able to use fragrance free soaps and laundry products.    . Reglan [Metoclopramide] Itching and Rash      Medication List       Accurate as of 01/01/16 11:28 AM. Always use your most recent med list.          cholecalciferol 1000 units tablet Commonly known as:  VITAMIN D Take 1,000 Units by mouth daily.   diltiazem 240 MG 24 hr capsule Commonly known as:  DILACOR XR TAKE 1 CAPSULE BY MOUTH DAILY   furosemide 20 MG tablet Commonly known as:  LASIX Take 20 mg by mouth daily as needed for edema.   multivitamin with minerals Tabs tablet Take 1 tablet by mouth daily.       Review of Systems:    Review of Systems  Constitutional: Positive for malaise/fatigue. Negative for chills, fever and weight loss.  HENT: Positive for hearing loss. Negative for congestion, ear pain and sore throat.   Eyes: Negative for blurred vision.  Respiratory: Negative for cough and shortness of breath.   Cardiovascular: Positive for leg swelling. Negative for chest pain and palpitations.       Left ankle greater than right (had ED workup)  Gastrointestinal: Negative for abdominal pain, blood in stool, constipation, diarrhea and melena.  Genitourinary: Positive for frequency. Negative for dysuria and urgency.       Is incontinent  Musculoskeletal: Positive for falls.  Skin: Positive for rash.       Left ankle with contact derm  Neurological: Positive for weakness. Negative for dizziness, loss of consciousness and headaches.  Endo/Heme/Allergies: Bruises/bleeds easily.       Left neck swelling, also minor on right  Psychiatric/Behavioral: Positive for memory loss. Negative for depression.    Health Maintenance  Topic Date Due  . ZOSTAVAX  10/23/1990  . DEXA SCAN  10/23/1995  . PNA vac Low Risk Adult (1 of 2 - PCV13) 10/23/1995  . INFLUENZA VACCINE  12/22/2015  . TETANUS/TDAP  01/09/2025    Physical Exam: Vitals:   01/01/16 1125  BP: 132/70  Pulse: 86  Temp: 97.9 F (36.6 C)  TempSrc: Oral  SpO2: 96%  Weight: 151 lb (68.5 kg)   Body mass index is 25.13 kg/m. Physical Exam  Constitutional: She appears well-developed and well-nourished. No distress.  HENT:  Head: Normocephalic and atraumatic.  Nose: Nose normal.  Mouth/Throat: Oropharynx is clear and moist. No oropharyngeal exudate.  Eyes: Conjunctivae are normal. Right eye exhibits no discharge. Left eye exhibits no discharge.  Neck: Normal range of motion.  Firm left cervical node that is quite enlarged; mildly inflamed right cervical nodes also  Cardiovascular: Normal rate, regular rhythm, normal heart sounds and intact distal  pulses.   Pulmonary/Chest: Effort normal and breath sounds normal.  Musculoskeletal: Normal range of motion.  Slow stooped posture as walking; carries her cane instead of using it  Lymphadenopathy:       Head (right side): No submental, no submandibular, no tonsillar, no preauricular, no posterior auricular and no occipital adenopathy present.       Head (left side): No submental, no submandibular, no tonsillar, no preauricular, no posterior auricular and no occipital adenopathy present.    She has cervical adenopathy.       Right cervical: Superficial cervical adenopathy present. No deep cervical and no posterior cervical adenopathy present.      Left cervical: Superficial cervical adenopathy present. No deep cervical  and no posterior cervical adenopathy present.    She has no axillary adenopathy.       Right: No supraclavicular and no epitrochlear adenopathy present.       Left: No supraclavicular and no epitrochlear adenopathy present.  Neurological: She is alert.  Skin: Skin is warm and dry.  Varicosities of left foot and ankle more than right; increased edema left vs. Right  Psychiatric:  Anxious, very pleasant    Labs reviewed: Basic Metabolic Panel:  Recent Labs  01/12/15 0536 01/12/15 0926  03/19/15 0824 06/10/15 0826 12/19/15 2112  NA 139  --   < > 142 143 137  K 3.2*  --   < > 5.1 4.1 3.6  CL 106  --   < > 100 101 105  CO2 24  --   < > 25 25 23   GLUCOSE 97  --   < > 98 110* 106*  BUN 5*  --   < > 11 11 11   CREATININE 0.58  --   < > 0.72 0.69 0.58  CALCIUM 8.4*  --   < > 9.8 9.7 9.1  MG 1.9  --   --   --   --   --   PHOS  --  2.1*  --   --   --   --   TSH  --   --   --   --  0.982  --   < > = values in this interval not displayed. Liver Function Tests:  Recent Labs  03/19/15 0824 06/10/15 0826  AST 15 27  ALT 14 20  ALKPHOS 76 67  BILITOT 0.4 0.3  PROT 7.4 7.0  ALBUMIN 4.3 4.1   No results for input(s): LIPASE, AMYLASE in the last 8760 hours. No  results for input(s): AMMONIA in the last 8760 hours. CBC:  Recent Labs  01/10/15 0722 01/12/15 0536 03/19/15 0824 06/10/15 0826 12/19/15 2112  WBC 9.2 5.0 7.1 4.9 4.9  NEUTROABS  --   --  5.7 2.8 2.5  HGB 13.9 13.4  --   --  13.9  HCT 41.3 40.4 42.2 39.7 41.3  MCV 96.7 98.1 97 95 96.9  PLT 177 160 239  --  183   Lipid Panel: No results for input(s): CHOL, HDL, LDLCALC, TRIG, CHOLHDL, LDLDIRECT in the last 8760 hours. Lab Results  Component Value Date   HGBA1C 6.0 (H) 03/19/2015     Assessment/Plan 1. Lymphadenopathy of left cervical region - also small amt present on right - I'm concerned b/c she has no infectious cause evident at this time and nodes are pretty firm  - if not better in a week or labs abnormal now, will refer to ENT for further evaluation (?lymphoma) - CBC with Differential/Platelet  2. Chronic venous insufficiency -left greater than right with negative d dimer at ED  3. Mild cognitive impairment with memory loss -seems this is progressing -she continues to live alone with support of grand daughters   4. Essential hypertension, benign -bp at goal here today, cont same regimen  5. Gait disorder -not compliant with walker I recommended, uses cane, but mostly carries it so falls frequently  Labs/tests ordered:   Orders Placed This Encounter  Procedures  . CBC with Differential/Platelet   Next appt:  1 month for med mgt/f/u on lymph nodes  Sharese Manrique L. Jenah Vanasten, D.O. [facility] [address], [place] [address] Cell Phone (Mon-Fri 8am-5pm):  [phone]  On Call:  [phone] & follow prompts after 5pm & weekends Office Phone:  [phone] Office Fax:  [phone]

## 2016-01-01 NOTE — Patient Instructions (Signed)
Wear compression hose when up out of bed. Elevate your feet at rest.  We'll watch the lymph nodes in your neck for one week.  If they are not better, I want you to see the ear nose and throat doctor.

## 2016-01-04 ENCOUNTER — Encounter: Payer: Self-pay | Admitting: *Deleted

## 2016-01-07 ENCOUNTER — Telehealth: Payer: Self-pay | Admitting: *Deleted

## 2016-01-07 DIAGNOSIS — R590 Localized enlarged lymph nodes: Secondary | ICD-10-CM

## 2016-01-07 NOTE — Telephone Encounter (Signed)
Patient granddaughter, Vicente Males called and requested results from bloodwork that was done at the appointment on 01/01/16 and wants to know when the ENT appointment could be scheduled if needed. Please Advise.

## 2016-01-07 NOTE — Telephone Encounter (Signed)
Labs were normal.  Does she still have the lymph node inflamed on the left side of her neck?

## 2016-01-08 DIAGNOSIS — R590 Localized enlarged lymph nodes: Secondary | ICD-10-CM | POA: Insufficient documentation

## 2016-01-08 NOTE — Telephone Encounter (Signed)
LMOM to return call.

## 2016-01-08 NOTE — Telephone Encounter (Signed)
Beth Foster notified and stated that yes the left side of neck is still inflamed.

## 2016-01-15 DIAGNOSIS — M542 Cervicalgia: Secondary | ICD-10-CM | POA: Diagnosis not present

## 2016-02-04 ENCOUNTER — Ambulatory Visit: Payer: Medicare Other | Admitting: Internal Medicine

## 2016-02-11 ENCOUNTER — Ambulatory Visit (INDEPENDENT_AMBULATORY_CARE_PROVIDER_SITE_OTHER): Payer: Medicare Other | Admitting: Internal Medicine

## 2016-02-11 ENCOUNTER — Encounter: Payer: Self-pay | Admitting: Internal Medicine

## 2016-02-11 VITALS — BP 158/88 | HR 88 | Temp 97.7°F | Ht 65.0 in | Wt 152.0 lb

## 2016-02-11 DIAGNOSIS — G3184 Mild cognitive impairment, so stated: Secondary | ICD-10-CM

## 2016-02-11 DIAGNOSIS — R269 Unspecified abnormalities of gait and mobility: Secondary | ICD-10-CM

## 2016-02-11 DIAGNOSIS — R221 Localized swelling, mass and lump, neck: Secondary | ICD-10-CM | POA: Diagnosis not present

## 2016-02-11 DIAGNOSIS — Z23 Encounter for immunization: Secondary | ICD-10-CM | POA: Diagnosis not present

## 2016-02-11 DIAGNOSIS — L908 Other atrophic disorders of skin: Secondary | ICD-10-CM | POA: Diagnosis not present

## 2016-02-11 DIAGNOSIS — I10 Essential (primary) hypertension: Secondary | ICD-10-CM

## 2016-02-11 DIAGNOSIS — I872 Venous insufficiency (chronic) (peripheral): Secondary | ICD-10-CM | POA: Diagnosis not present

## 2016-02-11 DIAGNOSIS — H02409 Unspecified ptosis of unspecified eyelid: Secondary | ICD-10-CM

## 2016-02-11 NOTE — Progress Notes (Signed)
Location:  [facility] clinic Provider:  Jamilia Jacques L. Mariea Clonts, D.O., C.M.D.  Code Status: DNR Goals of Care:  Advanced Directives 02/11/2016  Does patient have an advance directive? Yes  Type of Paramedic of [place];Living will  Does patient want to make changes to advanced directive? -  Copy of advanced directive(s) in chart? Yes  Would patient like information on creating an advanced directive? -  Pre-existing out of facility DNR order (yellow form or pink MOST form) -   Chief Complaint  Patient presents with  . Medical Management of Chronic Issues    one month medication management, follow up on lymph notes. Here with grandson-in-law Iona Beard.    HPI: Patient is a 82 y.o. female seen today for f/u after her visit with ENT where they thought the lymph node swelling I was worried about was actually a swollen carotid bulb.    She is more concerned about her edema of her legs, left worse than right.    She is noting that her eyes are drooping down over her eyes.  She did not tell her optemetrist when she went.  She goes to [facility] [employer].    She is not daily checking her bp.  It's high here again, but has refused intervention beyond what she takes now.    Vicente Males has spoken with [place] some about moving to another place.  Loves her house and wants to stay there.  She says she doesn't want to be where other people are or people are right outside her door and people are mixed up.  No recent falls.  Using cane.  Is seeing podiatry regularly now for toenail trimming.  Past Medical History:  Diagnosis Date  . Anxiety state, unspecified   . Bunion   . Contact dermatitis and other eczema due to other chemical products   . Edema   . External hemorrhoids without mention of complication   . GERD (gastroesophageal reflux disease)   . Hypertension   . Malignant neoplasm of breast (female), unspecified site dx'd 1996   rt breast; xrt   . Pain in joint, pelvic  region and thigh   . Rash and other nonspecific skin eruption   . Stroke ([place])   . Unspecified cataract   . Unspecified late effects of cerebrovascular disease   . Viral hepatitis A without mention of hepatic coma   . Viral hepatitis A without mention of hepatic coma   . Vitamin D deficiency     Past Surgical History:  Procedure Laterality Date  . BREAST LUMPECTOMY  1995  . CARDIAC CATHETERIZATION  2004  . CHOLECYSTECTOMY  1972    Allergies  Allergen Reactions  . Amoxicillin Anaphylaxis and Other (See Comments)    Has patient had a PCN reaction causing immediate rash, facial/tongue/throat swelling, SOB or lightheadedness with hypotension: Yes Has patient had a PCN reaction causing severe rash involving mucus membranes or skin necrosis: No Has patient had a PCN reaction that required hospitalization No Has patient had a PCN reaction occurring within the last 10 years: No If all of the above answers are "NO", then may proceed with Cephalosporin use.  . Angiotensin Receptor Blockers Anaphylaxis, Itching and Rash  . Beta Adrenergic Blockers Anaphylaxis, Itching and Rash  . Cephalexin Anaphylaxis  . Clindamycin/Lincomycin Anaphylaxis, Itching and Rash  . Eggs Or Egg-Derived Products Other (See Comments)    Reaction:  Blisters in mouth   . Tape Other (See Comments)  Reaction:  Pulls skin off   . Corticosteroids Itching and Rash  . Hydrocortisone Itching and Rash  . Latex Itching and Rash  . Other Itching, Rash and Other (See Comments)    Pt states that she has a pine allergy and she is only able to use fragrance free soaps and laundry products.    . Reglan [Metoclopramide] Itching and Rash      Medication List       Accurate as of 02/11/16  7:47 AM. Always use your most recent med list.          cholecalciferol 1000 units tablet Commonly known as:  VITAMIN D Take 1,000 Units by mouth daily.   diltiazem 240 MG 24 hr capsule Commonly known as:  DILACOR XR TAKE 1  CAPSULE BY MOUTH DAILY   furosemide 20 MG tablet Commonly known as:  LASIX Take 20 mg by mouth daily as needed for edema.   multivitamin with minerals Tabs tablet Take 1 tablet by mouth daily.       Review of Systems:  Review of Systems  Constitutional: Negative for chills, fever and malaise/fatigue.  Eyes: Negative for blurred vision.       Notes eyelids drooping especially right one  Respiratory: Negative for shortness of breath.   Cardiovascular: Positive for leg swelling. Negative for chest pain and palpitations.  Gastrointestinal: Negative for abdominal pain, blood in stool, constipation and melena.  Genitourinary: Negative for dysuria.  Musculoskeletal: Negative for falls.  Skin: Negative for itching and rash.  Neurological: Positive for weakness. Negative for dizziness, loss of consciousness and headaches.  Endo/Heme/Allergies: Bruises/bleeds easily.  Psychiatric/Behavioral: Positive for memory loss.    Health Maintenance  Topic Date Due  . ZOSTAVAX  10/23/1990  . DEXA SCAN  10/23/1995  . PNA vac Low Risk Adult (1 of 2 - PCV13) 10/23/1995  . INFLUENZA VACCINE  12/22/2015  . TETANUS/TDAP  01/09/2025    Physical Exam: Vitals:   02/11/16 0739  BP: (!) 158/88  Pulse: 88  Temp: 97.7 F (36.5 C)  TempSrc: Oral  SpO2: 96%  Weight: 152 lb (68.9 kg)  Height: 5\' 5"  (1.651 m)   Body mass index is 25.29 kg/m. Physical Exam  Constitutional: She is oriented to person, place, and time. She appears well-developed and well-nourished. No distress.  Cardiovascular: Normal rate, regular rhythm, normal heart sounds and intact distal pulses.   Edema left greater than right, varicose veins, is wearing compression socks  Pulmonary/Chest: Effort normal and breath sounds normal.  Abdominal: Soft. Bowel sounds are normal.  Musculoskeletal: Normal range of motion.  Walks with cane  Neurological: She is alert and oriented to person, place, and time.  Skin: Skin is warm and dry.  There is pallor.    Labs reviewed: Basic Metabolic Panel:  Recent Labs  03/19/15 0824 06/10/15 0826 12/19/15 2112  NA 142 143 137  K 5.1 4.1 3.6  CL 100 101 105  CO2 25 25 23   GLUCOSE 98 110* 106*  BUN 11 11 11   CREATININE 0.72 0.69 0.58  CALCIUM 9.8 9.7 9.1  TSH  --  0.982  --    Liver Function Tests:  Recent Labs  03/19/15 0824 06/10/15 0826  AST 15 27  ALT 14 20  ALKPHOS 76 67  BILITOT 0.4 0.3  PROT 7.4 7.0  ALBUMIN 4.3 4.1   No results for input(s): LIPASE, AMYLASE in the last 8760 hours. No results for input(s): AMMONIA in the last 8760 hours. CBC:  Recent  Labs  03/19/15 0824 06/10/15 0826 12/19/15 2112 01/01/16 1206  WBC 7.1 4.9 4.9 5.0  NEUTROABS 5.7 2.8 2.5 2,950  HGB  --   --  13.9 13.6  HCT 42.2 39.7 41.3 40.0  MCV 97 95 96.9 93.7  PLT 239  --  183 216   Lipid Panel: No results for input(s): CHOL, HDL, LDLCALC, TRIG, CHOLHDL, LDLDIRECT in the last 8760 hours. Lab Results  Component Value Date   HGBA1C 6.0 (H) 03/19/2015    Procedures since last visit: No results found.  Assessment/Plan 1. Neck swelling -on left was felt to be due to pt's carotid bulb and her SCM muscle on the left -seems better now so she did not need to return to ENT  2. Chronic venous insufficiency -left greater than right, cont compression socks  3. Mild cognitive impairment with memory loss -is gradually worsening and her granddaughter is trying to convince her to move to [place], but pt refusing  4. Ptosis due to aging -advised she should see an ophthalmologist for this if she wants any surgery done to help with her visual fields  5. Essential hypertension, benign -bp elevated, but pt refuses to take more medications and had various reactions to majority of classes of meds in the past  6. Gait disorder -cont use of cane, won't use walker regularly as recommended -fortunately, no recent falls since before her last regular visit  7. Need for prophylactic  vaccination and inoculation against influenza -flu shot given  Labs/tests ordered:  Cbc, cmp, flp Next appt:  3 mos for annual wellness, cpe  Larah Kuntzman L. Wesson Stith, D.O. [facility] [address], [place] [address] Cell Phone (Mon-Fri 8am-5pm):  [phone] On Call:  [phone] & follow prompts after 5pm & weekends Office Phone:  [phone] Office Fax:  [phone]

## 2016-03-10 ENCOUNTER — Other Ambulatory Visit: Payer: Self-pay | Admitting: Internal Medicine

## 2016-03-22 ENCOUNTER — Ambulatory Visit: Payer: Medicare Other | Admitting: Podiatry

## 2016-03-24 ENCOUNTER — Encounter: Payer: Self-pay | Admitting: Podiatry

## 2016-03-24 ENCOUNTER — Ambulatory Visit (INDEPENDENT_AMBULATORY_CARE_PROVIDER_SITE_OTHER): Payer: Medicare Other | Admitting: Podiatry

## 2016-03-24 DIAGNOSIS — B351 Tinea unguium: Secondary | ICD-10-CM

## 2016-03-24 DIAGNOSIS — M79676 Pain in unspecified toe(s): Secondary | ICD-10-CM

## 2016-03-24 NOTE — Progress Notes (Signed)
She presents today with chief complaint of painful elongated toenails.  Objective: Pulses are palpable bilateral. Severe elongated painful thick mycotic nails.  Assessment: Pain limbs and onychomycosis.  Plan: Debridement of toenails 1 through 5 bilateral.

## 2016-04-18 ENCOUNTER — Telehealth: Payer: Self-pay | Admitting: Internal Medicine

## 2016-04-18 NOTE — Telephone Encounter (Signed)
Left msg asking pt to confirm this rescheduled AWV (from 12/29 to 12/21) so AWV is before CPE, not after. VDM (DD)

## 2016-05-12 ENCOUNTER — Ambulatory Visit: Payer: Medicare Other

## 2016-05-18 ENCOUNTER — Other Ambulatory Visit: Payer: Medicare Other

## 2016-05-20 ENCOUNTER — Ambulatory Visit: Payer: Medicare Other

## 2016-05-20 ENCOUNTER — Encounter: Payer: Medicare Other | Admitting: Internal Medicine

## 2016-06-11 ENCOUNTER — Other Ambulatory Visit: Payer: Self-pay | Admitting: Internal Medicine

## 2016-06-21 ENCOUNTER — Ambulatory Visit (INDEPENDENT_AMBULATORY_CARE_PROVIDER_SITE_OTHER): Payer: Medicare Other | Admitting: Podiatry

## 2016-06-21 ENCOUNTER — Encounter: Payer: Self-pay | Admitting: Podiatry

## 2016-06-21 DIAGNOSIS — M79676 Pain in unspecified toe(s): Secondary | ICD-10-CM | POA: Diagnosis not present

## 2016-06-21 DIAGNOSIS — B351 Tinea unguium: Secondary | ICD-10-CM | POA: Diagnosis not present

## 2016-06-21 NOTE — Progress Notes (Signed)
She presents today with her grandson with a chief complaint of painful elongated toenails.  Objective: Toenails elongated yellow dystrophic, mycotic pulses remain strongly palpable. No open lesions or wounds are noted.  Assessment: Patient lives in a onychomycosis.  Plan: Debridement of toenails 1 through 5 bilateral covered service secondary to pain.

## 2016-07-09 ENCOUNTER — Emergency Department (HOSPITAL_COMMUNITY): Payer: Medicare Other

## 2016-07-09 ENCOUNTER — Observation Stay (HOSPITAL_COMMUNITY)
Admission: EM | Admit: 2016-07-09 | Discharge: 2016-07-10 | Disposition: A | Discharge status: Home / Self Care | Payer: Medicare Other | Attending: Nephrology | Admitting: Nephrology

## 2016-07-09 ENCOUNTER — Encounter (HOSPITAL_COMMUNITY): Payer: Self-pay | Admitting: Emergency Medicine

## 2016-07-09 ENCOUNTER — Emergency Department (HOSPITAL_BASED_OUTPATIENT_CLINIC_OR_DEPARTMENT_OTHER)
Admit: 2016-07-09 | Discharge: 2016-07-09 | Disposition: A | Discharge status: Home / Self Care | Payer: Medicare Other | Attending: Emergency Medicine | Admitting: Emergency Medicine

## 2016-07-09 DIAGNOSIS — Z91012 Allergy to eggs: Secondary | ICD-10-CM | POA: Diagnosis not present

## 2016-07-09 DIAGNOSIS — K644 Residual hemorrhoidal skin tags: Secondary | ICD-10-CM | POA: Diagnosis not present

## 2016-07-09 DIAGNOSIS — M7989 Other specified soft tissue disorders: Secondary | ICD-10-CM | POA: Diagnosis not present

## 2016-07-09 DIAGNOSIS — R0600 Dyspnea, unspecified: Secondary | ICD-10-CM | POA: Insufficient documentation

## 2016-07-09 DIAGNOSIS — K219 Gastro-esophageal reflux disease without esophagitis: Secondary | ICD-10-CM | POA: Diagnosis not present

## 2016-07-09 DIAGNOSIS — Z880 Allergy status to penicillin: Secondary | ICD-10-CM | POA: Diagnosis not present

## 2016-07-09 DIAGNOSIS — Z801 Family history of malignant neoplasm of trachea, bronchus and lung: Secondary | ICD-10-CM | POA: Insufficient documentation

## 2016-07-09 DIAGNOSIS — M79609 Pain in unspecified limb: Secondary | ICD-10-CM

## 2016-07-09 DIAGNOSIS — Z888 Allergy status to other drugs, medicaments and biological substances status: Secondary | ICD-10-CM | POA: Insufficient documentation

## 2016-07-09 DIAGNOSIS — L539 Erythematous condition, unspecified: Secondary | ICD-10-CM | POA: Diagnosis not present

## 2016-07-09 DIAGNOSIS — Z8619 Personal history of other infectious and parasitic diseases: Secondary | ICD-10-CM | POA: Diagnosis not present

## 2016-07-09 DIAGNOSIS — I10 Essential (primary) hypertension: Secondary | ICD-10-CM | POA: Insufficient documentation

## 2016-07-09 DIAGNOSIS — N6310 Unspecified lump in the right breast, unspecified quadrant: Secondary | ICD-10-CM | POA: Insufficient documentation

## 2016-07-09 DIAGNOSIS — E559 Vitamin D deficiency, unspecified: Secondary | ICD-10-CM | POA: Insufficient documentation

## 2016-07-09 DIAGNOSIS — R262 Difficulty in walking, not elsewhere classified: Secondary | ICD-10-CM | POA: Diagnosis not present

## 2016-07-09 DIAGNOSIS — R600 Localized edema: Secondary | ICD-10-CM | POA: Diagnosis not present

## 2016-07-09 DIAGNOSIS — Z853 Personal history of malignant neoplasm of breast: Secondary | ICD-10-CM | POA: Insufficient documentation

## 2016-07-09 DIAGNOSIS — J309 Allergic rhinitis, unspecified: Secondary | ICD-10-CM | POA: Diagnosis not present

## 2016-07-09 DIAGNOSIS — L03115 Cellulitis of right lower limb: Principal | ICD-10-CM | POA: Insufficient documentation

## 2016-07-09 DIAGNOSIS — Z8673 Personal history of transient ischemic attack (TIA), and cerebral infarction without residual deficits: Secondary | ICD-10-CM | POA: Insufficient documentation

## 2016-07-09 DIAGNOSIS — E785 Hyperlipidemia, unspecified: Secondary | ICD-10-CM | POA: Insufficient documentation

## 2016-07-09 DIAGNOSIS — Z881 Allergy status to other antibiotic agents status: Secondary | ICD-10-CM | POA: Diagnosis not present

## 2016-07-09 DIAGNOSIS — R591 Generalized enlarged lymph nodes: Secondary | ICD-10-CM | POA: Diagnosis not present

## 2016-07-09 DIAGNOSIS — N644 Mastodynia: Secondary | ICD-10-CM | POA: Insufficient documentation

## 2016-07-09 DIAGNOSIS — F419 Anxiety disorder, unspecified: Secondary | ICD-10-CM | POA: Insufficient documentation

## 2016-07-09 DIAGNOSIS — Z79899 Other long term (current) drug therapy: Secondary | ICD-10-CM | POA: Insufficient documentation

## 2016-07-09 DIAGNOSIS — Z9049 Acquired absence of other specified parts of digestive tract: Secondary | ICD-10-CM | POA: Insufficient documentation

## 2016-07-09 DIAGNOSIS — M79604 Pain in right leg: Secondary | ICD-10-CM | POA: Diagnosis not present

## 2016-07-09 DIAGNOSIS — Z91048 Other nonmedicinal substance allergy status: Secondary | ICD-10-CM | POA: Insufficient documentation

## 2016-07-09 DIAGNOSIS — R739 Hyperglycemia, unspecified: Secondary | ICD-10-CM | POA: Diagnosis not present

## 2016-07-09 DIAGNOSIS — G3184 Mild cognitive impairment, so stated: Secondary | ICD-10-CM | POA: Insufficient documentation

## 2016-07-09 DIAGNOSIS — Z808 Family history of malignant neoplasm of other organs or systems: Secondary | ICD-10-CM | POA: Insufficient documentation

## 2016-07-09 DIAGNOSIS — I872 Venous insufficiency (chronic) (peripheral): Secondary | ICD-10-CM | POA: Insufficient documentation

## 2016-07-09 DIAGNOSIS — L0390 Cellulitis, unspecified: Secondary | ICD-10-CM

## 2016-07-09 DIAGNOSIS — Z885 Allergy status to narcotic agent status: Secondary | ICD-10-CM | POA: Insufficient documentation

## 2016-07-09 DIAGNOSIS — Z91040 Latex allergy status: Secondary | ICD-10-CM | POA: Insufficient documentation

## 2016-07-09 LAB — CBC WITH DIFFERENTIAL/PLATELET
Basophils Absolute: 0 10*3/uL (ref 0.0–0.1)
Basophils Relative: 0 %
Eosinophils Absolute: 0 10*3/uL (ref 0.0–0.7)
Eosinophils Relative: 1 %
HCT: 41.7 % (ref 36.0–46.0)
Hemoglobin: 13.5 g/dL (ref 12.0–15.0)
Lymphocytes Relative: 20 %
Lymphs Abs: 1.2 10*3/uL (ref 0.7–4.0)
MCH: 31.3 pg (ref 26.0–34.0)
MCHC: 32.4 g/dL (ref 30.0–36.0)
MCV: 96.8 fL (ref 78.0–100.0)
Monocytes Absolute: 0.4 10*3/uL (ref 0.1–1.0)
Monocytes Relative: 7 %
Neutro Abs: 4.3 10*3/uL (ref 1.7–7.7)
Neutrophils Relative %: 72 %
Platelets: 185 10*3/uL (ref 150–400)
RBC: 4.31 MIL/uL (ref 3.87–5.11)
RDW: 12.9 % (ref 11.5–15.5)
WBC: 6 10*3/uL (ref 4.0–10.5)

## 2016-07-09 LAB — BASIC METABOLIC PANEL
Anion gap: 11 (ref 5–15)
BUN: 9 mg/dL (ref 6–20)
CO2: 26 mmol/L (ref 22–32)
Calcium: 9.6 mg/dL (ref 8.9–10.3)
Chloride: 103 mmol/L (ref 101–111)
Creatinine, Ser: 0.73 mg/dL (ref 0.44–1.00)
GFR calc Af Amer: >60 mL/min (ref >60)
GFR calc non Af Amer: >60 mL/min (ref >60)
Glucose, Bld: 111 mg/dL — ABNORMAL HIGH (ref 65–99)
Potassium: 4.4 mmol/L (ref 3.5–5.1)
Sodium: 140 mmol/L (ref 135–145)

## 2016-07-09 LAB — PROTIME-INR
INR: 1.01
Prothrombin Time: 13.3 seconds (ref 11.4–15.2)

## 2016-07-09 MED ORDER — DILTIAZEM HCL ER 240 MG PO CP24
240.0000 mg | ORAL_CAPSULE | Freq: Every day | ORAL | Status: DC
  Administered 2016-07-09: 240 mg via ORAL
  Filled 2016-07-09 (×2): qty 1

## 2016-07-09 MED ORDER — VANCOMYCIN HCL 500 MG IV SOLR: 500.0000 mg | Freq: Two times a day (BID) | INTRAVENOUS | Status: DC

## 2016-07-09 MED ORDER — HYDROCODONE-ACETAMINOPHEN 5-325 MG PO TABS
1.0000 | ORAL_TABLET | Freq: Once | ORAL | Status: DC
  Filled 2016-07-09: qty 1

## 2016-07-09 MED ORDER — VANCOMYCIN HCL 500 MG IV SOLR
500.0000 mg | Freq: Two times a day (BID) | INTRAVENOUS | Status: DC
  Administered 2016-07-10: 500 mg via INTRAVENOUS
  Filled 2016-07-09 (×2): qty 500

## 2016-07-09 MED ORDER — ONDANSETRON 4 MG PO TBDP
4.0000 mg | ORAL_TABLET | Freq: Once | ORAL | Status: AC
  Administered 2016-07-09: 4 mg via ORAL
  Filled 2016-07-09: qty 1

## 2016-07-09 MED ORDER — VANCOMYCIN HCL IN DEXTROSE 1-5 GM/200ML-% IV SOLN
1000.0000 mg | Freq: Once | INTRAVENOUS | Status: AC
  Administered 2016-07-09: 1000 mg via INTRAVENOUS
  Filled 2016-07-09: qty 200

## 2016-07-09 MED ORDER — DIPHENHYDRAMINE HCL 25 MG PO CAPS: 25.0000 mg | ORAL_CAPSULE | Freq: Three times a day (TID) | ORAL | Status: DC | PRN

## 2016-07-09 MED ORDER — OXYCODONE HCL 5 MG PO TABS: 5.0000 mg | ORAL_TABLET | ORAL | Status: DC | PRN

## 2016-07-09 MED ORDER — IOPAMIDOL (ISOVUE-370) INJECTION 76%
INTRAVENOUS | Status: AC
  Administered 2016-07-09: 100 mL
  Filled 2016-07-09: qty 100

## 2016-07-09 MED ORDER — SODIUM CHLORIDE 0.9 % IV BOLUS (SEPSIS)
500.0000 mL | Freq: Once | INTRAVENOUS | Status: AC
  Administered 2016-07-09: 500 mL via INTRAVENOUS

## 2016-07-09 MED ORDER — VANCOMYCIN HCL 500 MG IV SOLR
250.0000 mg | Freq: Once | INTRAVENOUS | Status: DC
  Filled 2016-07-09: qty 250

## 2016-07-09 MED ORDER — VANCOMYCIN HCL 10 G IV SOLR
1250.0000 mg | Freq: Once | INTRAVENOUS | Status: DC
  Filled 2016-07-09: qty 1250

## 2016-07-09 MED ORDER — ACETAMINOPHEN 325 MG PO TABS
650.0000 mg | ORAL_TABLET | Freq: Four times a day (QID) | ORAL | Status: DC | PRN
  Administered 2016-07-09: 325 mg via ORAL
  Filled 2016-07-09: qty 2

## 2016-07-09 MED ORDER — ONDANSETRON HCL 4 MG/2ML IJ SOLN: 4.0000 mg | Freq: Once | INTRAMUSCULAR | Status: DC

## 2016-07-09 MED ORDER — ADULT MULTIVITAMIN W/MINERALS CH
1.0000 | ORAL_TABLET | Freq: Every day | ORAL | Status: DC
  Administered 2016-07-10: 1 via ORAL
  Filled 2016-07-09: qty 1

## 2016-07-09 MED ORDER — POLYETHYLENE GLYCOL 3350 17 G PO PACK: 17.0000 g | PACK | Freq: Every day | ORAL | Status: DC | PRN

## 2016-07-09 MED ORDER — SODIUM CHLORIDE 0.9 % IV SOLN
INTRAVENOUS | Status: DC
  Administered 2016-07-09: 18:00:00 via INTRAVENOUS

## 2016-07-09 MED ORDER — VITAMIN D 1000 UNITS PO TABS
1000.0000 [IU] | ORAL_TABLET | Freq: Every day | ORAL | Status: DC
  Administered 2016-07-10: 1000 [IU] via ORAL
  Filled 2016-07-09: qty 1

## 2016-07-09 MED ORDER — HYDROCODONE-ACETAMINOPHEN 5-325 MG PO TABS
1.0000 | ORAL_TABLET | ORAL | Status: DC | PRN
  Administered 2016-07-09 – 2016-07-10 (×3): 1 via ORAL
  Filled 2016-07-09 (×3): qty 1

## 2016-07-09 MED ORDER — ACETAMINOPHEN 650 MG RE SUPP: 650.0000 mg | Freq: Four times a day (QID) | RECTAL | Status: DC | PRN

## 2016-07-09 NOTE — H&P (Signed)
History and Physical    Beth Foster [id number] DOB: 29-Jun-1928 DOA: 07/09/2016  PCP: Hollace Kinnier, DO  Patient coming from:Home  Chief Complaint:right leg pain.  HPI: Beth Foster is a 83 y.o. female with medical history significant of hypertension, hemorrhagic stroke, anxiety disorder presented from home with sudden onset of right lower extremity pain, erythema. Patient reported that she woke up around 5 AM this morning with severe right lower extremity pain, redness associated with increased warmth. She reported that the pain was difficult to describe but was very severe, no radiation. She was not able to ambulate because of the pain. Patient reported she had upper respiratory symptoms about a week ago which is resolved now. Denied fever, chills, headache, dizziness, nausea, vomiting, chest pain, shortness of breath. She walks with the help of walker. She is not on any blood thinner. ED Course: Vitals and labs unremarkable. Patient received CT angiogram of right lower extremities with possible venous insufficiency. Doppler ultrasound negative for DVT. Received a dose of vancomycin in the ER for cellulitis. As per patient her pain and redness is already improving. Patient has 2 granddaughters and 1 grandson at the bedside in the ER. They also reported that the redness is improving.  Review of Systems: As per HPI otherwise 10 point review of systems negative.    Past Medical History:  Diagnosis Date  . Anxiety state, unspecified   . Bunion   . Contact dermatitis and other eczema due to other chemical products   . Edema   . External hemorrhoids without mention of complication   . GERD (gastroesophageal reflux disease)   . Hypertension   . Malignant neoplasm of breast (female), unspecified site dx'd 1996   rt breast; xrt   . Pain in joint, pelvic region and thigh   . Rash and other nonspecific skin eruption   . Stroke ([place])   . Unspecified cataract   . Unspecified late effects of  cerebrovascular disease   . Viral hepatitis A without mention of hepatic coma   . Viral hepatitis A without mention of hepatic coma   . Vitamin D deficiency     Past Surgical History:  Procedure Laterality Date  . BREAST LUMPECTOMY  1995  . CARDIAC CATHETERIZATION  2004  . CHOLECYSTECTOMY  1972     Social history: reports that she has never smoked. She has never used smokeless tobacco. She reports that she does not drink alcohol or use drugs.  Allergies  Allergen Reactions  . Amoxicillin Anaphylaxis and Other (See Comments)    Has patient had a PCN reaction causing immediate rash, facial/tongue/throat swelling, SOB or lightheadedness with hypotension: Yes Has patient had a PCN reaction causing severe rash involving mucus membranes or skin necrosis: No Has patient had a PCN reaction that required hospitalization No Has patient had a PCN reaction occurring within the last 10 years: No If all of the above answers are "NO", then may proceed with Cephalosporin use.  . Angiotensin Receptor Blockers Anaphylaxis, Itching and Rash  . Beta Adrenergic Blockers Anaphylaxis, Itching and Rash  . Cephalexin Anaphylaxis  . Clindamycin/Lincomycin Anaphylaxis, Itching and Rash  . Eggs Or Egg-Derived Products Other (See Comments)    Reaction:  Blisters in mouth   . Tape Other (See Comments)    Reaction:  Pulls skin off   . Corticosteroids Itching and Rash  . Hydrocortisone Itching and Rash  . Latex Itching and Rash  . Other Itching, Rash and Other (See Comments)  Pt states that she has a pine allergy and she is only able to use fragrance free soaps and laundry products.    . Reglan [Metoclopramide] Itching and Rash    Family History  Problem Relation Age of Onset  . Cancer Mother     lung  . Cancer Brother     brain     Prior to Admission medications   Medication Sig Start Date End Date Taking? Authorizing Provider  acetaminophen (TYLENOL) 325 MG tablet Take 325 mg by mouth every 6  (six) hours as needed for mild pain or headache.   Yes Historical Provider, MD  cholecalciferol (VITAMIN D) 1000 UNITS tablet Take 1,000 Units by mouth daily.    Yes Historical Provider, MD  diltiazem (DILACOR XR) 240 MG 24 hr capsule TAKE 1 CAPSULE BY MOUTH DAILY 06/13/16  Yes Tiffany L Reed, DO  furosemide (LASIX) 20 MG tablet Take 20 mg by mouth daily as needed for edema.    Historical Provider, MD  Multiple Vitamin (MULTIVITAMIN WITH MINERALS) TABS tablet Take 1 tablet by mouth daily.    Historical Provider, MD    Physical Exam: Vitals:   07/09/16 1515 07/09/16 1530 07/09/16 1545 07/09/16 1600  BP: 153/89 145/93 157/91 163/97  Pulse: 93 92 91 100  Resp:    20  Temp:      TempSrc:      SpO2: 95% 91% 96% 93%  Weight:      Height:          Constitutional: NAD, calm, comfortable Vitals:   07/09/16 1515 07/09/16 1530 07/09/16 1545 07/09/16 1600  BP: 153/89 145/93 157/91 163/97  Pulse: 93 92 91 100  Resp:    20  Temp:      TempSrc:      SpO2: 95% 91% 96% 93%  Weight:      Height:       Eyes: PERRL ENMT: Mucous membranes are moist.  Neck: normal, supple Respiratory: clear to auscultation bilaterally, no wheezing, no crackles. Normal respiratory effort. No accessory muscle use.  Cardiovascular: Regular rate and rhythm, no murmurs / rubs / gallops. No extremity edema. 2+ pedal pulses. Abdomen: no tenderness, no masses palpated. No hepatosplenomegaly. Bowel sounds positive.  Musculoskeletal: no clubbing / cyanosis. No joint deformity upper and lower extremities. Has bilateral lower extremity superficial dilated veins Skin: Right lower extremity has minimal redness, increased warmth and mild tenderness. Neurologic: CN 2-12 grossly intact.  Strength 5/5 in all 4.  Psychiatric: Normal judgment and insight. Alert and oriented x 3. Normal mood.    Labs on Admission: I have personally reviewed following labs and imaging studies  CBC:  Recent Labs Lab 07/09/16 1120  WBC 6.0    NEUTROABS 4.3  HGB 13.5  HCT 41.7  MCV 96.8  PLT 123XX123   Basic Metabolic Panel:  Recent Labs Lab 07/09/16 1120  NA 140  K 4.4  CL 103  CO2 26  GLUCOSE 111*  BUN 9  CREATININE 0.73  CALCIUM 9.6   GFR: Estimated Creatinine Clearance: 48.7 mL/min (by C-G formula based on SCr of 0.73 mg/dL). Liver Function Tests: No results for input(s): AST, ALT, ALKPHOS, BILITOT, PROT, ALBUMIN in the last 168 hours. No results for input(s): LIPASE, AMYLASE in the last 168 hours. No results for input(s): AMMONIA in the last 168 hours. Coagulation Profile:  Recent Labs Lab 07/09/16 1120  INR 1.01   Cardiac Enzymes: No results for input(s): CKTOTAL, CKMB, CKMBINDEX, TROPONINI in the last 168 hours. BNP (  last 3 results) No results for input(s): PROBNP in the last 8760 hours. HbA1C: No results for input(s): HGBA1C in the last 72 hours. CBG: No results for input(s): GLUCAP in the last 168 hours. Lipid Profile: No results for input(s): CHOL, HDL, LDLCALC, TRIG, CHOLHDL, LDLDIRECT in the last 72 hours. Thyroid Function Tests: No results for input(s): TSH, T4TOTAL, FREET4, T3FREE, THYROIDAB in the last 72 hours. Anemia Panel: No results for input(s): VITAMINB12, FOLATE, FERRITIN, TIBC, IRON, RETICCTPCT in the last 72 hours. Urine analysis:    Component Value Date/Time   COLORURINE STRAW (A) 01/10/2015 0941   APPEARANCEUR CLEAR 01/10/2015 0941   LABSPEC 1.009 01/10/2015 0941   PHURINE 7.5 01/10/2015 0941   GLUCOSEU 100 (A) 01/10/2015 0941   HGBUR NEGATIVE 01/10/2015 0941   BILIRUBINUR NEGATIVE 01/10/2015 0941   KETONESUR NEGATIVE 01/10/2015 0941   PROTEINUR NEGATIVE 01/10/2015 0941   UROBILINOGEN 0.2 01/10/2015 0941   NITRITE NEGATIVE 01/10/2015 0941   LEUKOCYTESUR NEGATIVE 01/10/2015 0941   Sepsis Labs: !!!!!!!!!!!!!!!!!!!!!!!!!!!!!!!!!!!!!!!!!!!! @LABRCNTIP (procalcitonin:4,lacticidven:4) )No results found for this or any previous visit (from the past 240 hour(s)).    Radiological Exams on Admission: Dg Tibia/fibula Right  Result Date: 07/09/2016 CLINICAL DATA:  Patient reports severe pain, swelling and possible knot on the medial side of right tib/fib. Patient reports pain is on her right foot as well radiates to her right tib/fib. EXAM: RIGHT TIBIA AND FIBULA - 2 VIEW COMPARISON:  None. FINDINGS: No fracture. No bone lesion. No bone resorption is seen to suggest osteomyelitis. The skeletal structures are demineralized. The knee and ankle joints are normally spaced and aligned. There is subcutaneous soft tissue edema of the mid to lower leg. No radiopaque foreign body. No soft tissue air. IMPRESSION: 1. No fracture, bone lesion or dislocation. 2. Mid to lower leg soft tissue edema. No soft tissue air or radiopaque foreign body. Electronically Signed   By: Lajean Manes M.D.   On: 07/09/2016 11:10   Ct Angio Ao+bifem W & Or Wo Contrast  Result Date: 07/09/2016 CLINICAL DATA:  Right leg erythema EXAM: CT ANGIOGRAPHY OF ABDOMINAL AORTA WITH ILIOFEMORAL RUNOFF TECHNIQUE: Multidetector CT imaging of the abdomen, pelvis and lower extremities was performed using the standard protocol during bolus administration of intravenous contrast. Multiplanar CT image reconstructions and MIPs were obtained to evaluate the vascular anatomy. CONTRAST:  100 cc Isovue 370 COMPARISON:  None. FINDINGS: Aorta is non aneurysmal and patent with mild atherosclerotic calcification Celiac is patent. There is mild atherosclerotic calcification at origin branch vessels are patent. There is a calcified density in the hilum of the spleen which may represent a small aneurysm measuring 8 mm. SMA is patent. Two right renal arteries and 2 left renal arteries are patent. IMA is patent. Bilateral common iliac, external iliac, and internal iliac arteries are patent. Right common femoral, superficial femoral, profunda femoral, and popliteal arteries are patent. There is 3 vessel runoff to the right ankle. Left  common femoral, superficial femoral, profunda femoral, and popliteal arteries are patent with 3 vessel runoff to the left ankle. There are prominent veins within the medial caps bilaterally suggesting venous insufficiency. Furthermore, contrast refluxes into the left greater saphenous vein within the left thigh. The distal thigh left greater saphenous vein is markedly enlarged. Again, this suggests venous insufficiency. Bibasilar scarring versus atelectasis in the lungs. Contour of the liver is slightly nodular suggesting early cirrhotic change. Postcholecystectomy. Pancreas is unremarkable. Spleen is unremarkable Tiny hypodensities in the kidneys are nonspecific. Right adrenal gland is unremarkable. There  is a low-density lesion in the left adrenal gland measuring 2.5 cm consistent with a benign adenoma. Bowel and stomach are non-opacified.  No obvious mass in the colon. The bladder is markedly distended. There are small lymph nodes along the aorta. This includes a tiny soft tissue density anterior to the aorta on image 66 of series 7. There is no vertebral compression deformity. Degenerative disc disease in the lumbar spine is noted. There is no free fluid. Review of the MIP images confirms the above findings. IMPRESSION: The aorta and iliac vasculature are patent. Femoral and popliteal arteries are patent. Three vessel runoff bilaterally There are prominent venous structures in the leg suggesting bilateral lower extremity venous insufficiency. This could account for the patient's symptoms. Early cirrhotic change in the liver Bladder is markedly distended Benign left adrenal adenoma. Electronically Signed   By: Marybelle Killings M.D.   On: 07/09/2016 15:32   Dg Foot Complete Right  Result Date: 07/09/2016 CLINICAL DATA:  Patient reports severe pain, swelling and possible knot on the medial side of right tib/fib. Patient reports pain is on her right foot as well radiates to her right tib/fib. EXAM: RIGHT FOOT  COMPLETE - 3+ VIEW COMPARISON:  None. FINDINGS: No fracture or dislocation. There is bony prominence from the medial first metatarsal head consistent with a bunion, as well as a significant hallux valgus deformity. Bones are demineralized. There is mild subcutaneous soft tissue edema of the midfoot and ankle. No soft tissue air. IMPRESSION: 1. No fracture, dislocation or acute skeletal abnormality. Electronically Signed   By: Lajean Manes M.D.   On: 07/09/2016 11:11    Assessment/Plan  # Cellulitis of right lower extremity; -CT angiography of lower extremities consistent with bilateral lower extremities venous insufficiency. I recommended patient to follow-up with her vascular surgeon as an outpatient. -X-ray of right lower extremities consistent with soft tissue edema possibly due to cellulitis -Doppler ultrasound of right lower extremities preliminary result with no DVTs -Reportedly the rash, pain already improving with IV vancomycin in the ER. I will continue the same antibiotics. If she continues to improve we will switch to oral antibiotics tomorrow. Patient has no fever or leukocytosis in the hospital. -Consulted physical therapist, OT to make sure patient can ambulate before discharge. -Continue Tylenol and oxycodone as needed for the pain management. -Bowel regimen as needed ordered -We will continue low-dose IV fluid since patient received IV contrast today and on vancomycin. Plan to discontinue IV fluid tomorrow morning.  #Essential hypertension: Blood pressure acceptable. Resume home dose of diltiazem. Monitor blood pressure.  DVT prophylaxis: No anticoagulation because of history of hemorrhagic stroke. Unable to order SCDs because of lower extremity cellulitis. I encouraged her to mobilize her legs and early ambulation Code Status: Full code Family Communication: I discussed with the patient's granddaughters and grandson at bedside in the ER Disposition Plan: Observe in the floor  tonight. Likely discharge home tomorrow. Consults called: None Admission status: Observation   Cleotha Tsang Tanna Furry MD [facility] Pager [phone]  If 7PM-7AM, please contact night-coverage [email or web address] Password [facility]  07/09/2016, 5:04 PM

## 2016-07-09 NOTE — ED Notes (Signed)
ED Provider at bedside. 

## 2016-07-09 NOTE — Progress Notes (Signed)
Pharmacy Antibiotic Note  Beth Foster is a 83 y.o. female admitted on 07/09/2016 with cellulitis.  Pharmacy has been consulted for vancomycin dosing. Patient with current SCr 0.73 and weight 68 kg. Afebrile, WBC wnl. Patient has received 1000 mg of vancomycin in ED, will order additional 250 mg to complete the loading dose  Plan: - Vancomycin 250 mg x1 to complete the loading dose - Followed by vancomycin 500 mg IV q12h - Monitor renal function, vital signs and length of therapy  Height: 5\' 4"  (162.6 cm) Weight: 150 lb (68 kg) IBW/kg (Calculated) : 54.7  Temp (24hrs), Avg:98 F (36.7 C), Min:97.9 F (36.6 C), Max:98.1 F (36.7 C)   Recent Labs Lab 07/09/16 1120  WBC 6.0  CREATININE 0.73    Estimated Creatinine Clearance: 48.7 mL/min (by C-G formula based on SCr of 0.73 mg/dL).    Allergies  Allergen Reactions  . Amoxicillin Anaphylaxis and Other (See Comments)    Has patient had a PCN reaction causing immediate rash, facial/tongue/throat swelling, SOB or lightheadedness with hypotension: Yes Has patient had a PCN reaction causing severe rash involving mucus membranes or skin necrosis: No Has patient had a PCN reaction that required hospitalization No Has patient had a PCN reaction occurring within the last 10 years: No If all of the above answers are "NO", then may proceed with Cephalosporin use.  . Angiotensin Receptor Blockers Anaphylaxis, Itching and Rash  . Beta Adrenergic Blockers Anaphylaxis, Itching and Rash  . Cephalexin Anaphylaxis  . Clindamycin/Lincomycin Anaphylaxis, Itching and Rash  . Eggs Or Egg-Derived Products Other (See Comments)    Reaction:  Blisters in mouth   . Tape Other (See Comments)    Reaction:  Pulls skin off   . Corticosteroids Itching and Rash  . Hydrocortisone Itching and Rash  . Latex Itching and Rash  . Other Itching, Rash and Other (See Comments)    Pt states that she has a pine allergy and she is only able to use fragrance free soaps  and laundry products.    . Reglan [Metoclopramide] Itching and Rash    Antimicrobials this admission: Vancomycin 2/17 >>  Dose adjustments this admission: N/A  Microbiology results: None ordered  Thank you for allowing pharmacy to be a part of this patient's care.  Dimitri Ped, PharmD, [place] PGY-2 Infectious Diseases Pharmacy Resident Pager: [phone] 07/09/2016 5:12 PM

## 2016-07-09 NOTE — ED Notes (Signed)
Vascular tech aware of patient 

## 2016-07-09 NOTE — ED Notes (Signed)
Pt taken to restroom via wheelchair.

## 2016-07-09 NOTE — ED Triage Notes (Signed)
Pt. Stated, I got up this morning and my right leg was hurting all the way up to my hip. No injury.

## 2016-07-09 NOTE — ED Provider Notes (Signed)
[facility] DEPT Provider Note   CSN: [id number] Arrival date & time: 07/09/16  [id number]     History   Chief Complaint Chief Complaint  Patient presents with  . Leg Pain    HPI Beth Foster is a spry 83 y.o. female Acuter onset R leg pain. Noticed it when she woke up arounf 5:00 AM. Worse with standing. Unable to charachterize the pain well. She describes Severe pain in the calf and posterior Knee.  she did not try to take any meds PTA. SHe has a hx of hemorraghic stroke  And is concerned for clot. She does on take any anticoagulants. No known injury and has never had anything like this before. She denies UL swelling.   HPI  Past Medical History:  Diagnosis Date  . Anxiety state, unspecified   . Bunion   . Contact dermatitis and other eczema due to other chemical products   . Edema   . External hemorrhoids without mention of complication   . GERD (gastroesophageal reflux disease)   . Hypertension   . Malignant neoplasm of breast (female), unspecified site dx'd 1996   rt breast; xrt   . Pain in joint, pelvic region and thigh   . Rash and other nonspecific skin eruption   . Stroke ([place])   . Unspecified cataract   . Unspecified late effects of cerebrovascular disease   . Viral hepatitis A without mention of hepatic coma   . Viral hepatitis A without mention of hepatic coma   . Vitamin D deficiency     Patient Active Problem List   Diagnosis Date Noted  . Left cervical lymphadenopathy 01/08/2016  . Cough 06/09/2015  . Dyspnea 06/09/2015  . Acute bronchitis 06/09/2015  . Allergic rhinitis 04/15/2015  . Syncope and collapse 01/10/2015  . Contusion of left supra orbit 01/10/2015  . Laceration of right eyebrow 01/10/2015  . DOE (dyspnea on exertion) 01/10/2015  . Exertional chest pain 01/10/2015  . Fatigue 01/10/2015  . Acute hyperglycemia 01/10/2015  . Hemorrhoids 10/28/2013  . Vitamin D deficiency 10/28/2013  . Painful lumpy right breast 12/06/2012  . HLD  (hyperlipidemia) 12/06/2012  . Essential hypertension, benign 12/06/2012  . Malignant neoplasm of breast (female), unspecified site   . Mild cognitive impairment with memory loss 08/20/2012  . Gait disorder 08/20/2012  . Wound, open, head 08/20/2012  . GERD (gastroesophageal reflux disease) 08/20/2012    Past Surgical History:  Procedure Laterality Date  . BREAST LUMPECTOMY  1995  . CARDIAC CATHETERIZATION  2004  . CHOLECYSTECTOMY  1972    OB History    No data available       Home Medications    Prior to Admission medications   Medication Sig Start Date End Date Taking? Authorizing Provider  cholecalciferol (VITAMIN D) 1000 UNITS tablet Take 1,000 Units by mouth daily.     Historical Provider, MD  diltiazem (DILACOR XR) 240 MG 24 hr capsule TAKE 1 CAPSULE BY MOUTH DAILY 06/13/16   Tiffany L Reed, DO  furosemide (LASIX) 20 MG tablet Take 20 mg by mouth daily as needed for edema.    Historical Provider, MD  Multiple Vitamin (MULTIVITAMIN WITH MINERALS) TABS tablet Take 1 tablet by mouth daily.    Historical Provider, MD    Family History Family History  Problem Relation Age of Onset  . Cancer Mother     lung  . Cancer Brother     brain    Social History Social History  Substance Use Topics  .  Smoking status: Never Smoker  . Smokeless tobacco: Never Used  . Alcohol use No     Allergies   Amoxicillin; Angiotensin receptor blockers; Beta adrenergic blockers; Cephalexin; Clindamycin/lincomycin; Eggs or egg-derived products; Tape; Corticosteroids; Hydrocortisone; Latex; Other; and Reglan [metoclopramide]   Review of Systems Review of Systems Ten systems reviewed and are negative for acute change, except as noted in the HPI.    Physical Exam Updated Vital Signs BP 173/86 (BP Location: Left Arm)   Pulse 76   Temp 97.9 F (36.6 C) (Oral)   Resp 17   Ht 5\' 4"  (1.626 m)   Wt 68 kg   SpO2 96%   BMI 25.75 kg/m   Physical Exam  Constitutional: She is oriented  to person, place, and time. She appears well-developed and well-nourished. No distress.  HENT:  Head: Normocephalic and atraumatic.  Eyes: Conjunctivae are normal. No scleral icterus.  Neck: Normal range of motion.  Cardiovascular: Normal rate, regular rhythm, normal heart sounds and intact distal pulses.  Exam reveals no gallop and no friction rub.   No murmur heard. Pulmonary/Chest: Effort normal and breath sounds normal. No respiratory distress.  Abdominal: Soft. Bowel sounds are normal. She exhibits no distension and no mass. There is no tenderness. There is no guarding.  Musculoskeletal:  Normal exam of the R hip, knee and ankle joints.  Neurological: She is alert and oriented to person, place, and time.  Skin: Skin is warm and dry. She is not diaphoretic.  BL spider veins and some venous tatooing. R leg much warmer to touch than the left. No sig swelling. Negative homan's but TTP in the foot, ankle , calf and popliteal region.     ED Treatments / Results  Labs (all labs ordered are listed, but only abnormal results are displayed) Labs Reviewed - No data to display  EKG  EKG Interpretation None       Radiology No results found.  Procedures Procedures (including critical care time)  Medications Ordered in ED Medications - No data to display   Initial Impression / Assessment and Plan / ED Course  I have reviewed the triage vital signs and the nursing notes.  Pertinent labs & imaging results that were available during my care of the patient were reviewed by me and considered in my medical decision making (see chart for details).     Patient with Negative CTA. Plain films negative. Her DVT study is also negative. She will be admitted for cellulitis as she is unable to ambulate. Patient agrees with POC.  Final Clinical Impressions(s) / ED Diagnoses   Final diagnoses:  Right leg pain  Cellulitis of right lower extremity    New Prescriptions New Prescriptions     No medications on file     Margarita Mail, PA-C 07/09/16 [place], DO 07/10/16 1518

## 2016-07-09 NOTE — ED Notes (Signed)
Patient transported to CT 

## 2016-07-09 NOTE — ED Notes (Signed)
Patient transported to Ultrasound 

## 2016-07-09 NOTE — Progress Notes (Signed)
VASCULAR LAB PRELIMINARY  PRELIMINARY  PRELIMINARY  PRELIMINARY  Right lower extremity venous duplex completed.    Preliminary report:  There is no DVT or SVT noted in the right lower extremity.   Called results to Margarita Mail, PA-C  Hamp More[place], RVT 07/09/2016, 4:32 PM

## 2016-07-09 NOTE — ED Notes (Signed)
Took pt to bathroom via wheelchair 

## 2016-07-09 NOTE — ED Notes (Signed)
Patients arm is red after vancomycin finished, iv checked and is patent, patient states "I feel fine, I feel much better than when I came here." Denies itching, shortness of breath or chest pain, states she has had some shortness of breath while ambulating, nurse on 5N notified and states that she will contact the doctor.

## 2016-07-10 DIAGNOSIS — L03115 Cellulitis of right lower limb: Secondary | ICD-10-CM | POA: Diagnosis not present

## 2016-07-10 LAB — BASIC METABOLIC PANEL
Anion gap: 8 (ref 5–15)
BUN: 7 mg/dL (ref 6–20)
CO2: 24 mmol/L (ref 22–32)
Calcium: 8.7 mg/dL — ABNORMAL LOW (ref 8.9–10.3)
Chloride: 106 mmol/L (ref 101–111)
Creatinine, Ser: 0.63 mg/dL (ref 0.44–1.00)
GFR calc Af Amer: >60 mL/min (ref >60)
GFR calc non Af Amer: >60 mL/min (ref >60)
Glucose, Bld: 111 mg/dL — ABNORMAL HIGH (ref 65–99)
Potassium: 3.6 mmol/L (ref 3.5–5.1)
Sodium: 138 mmol/L (ref 135–145)

## 2016-07-10 LAB — CBC
HCT: 40.5 % (ref 36.0–46.0)
Hemoglobin: 13.1 g/dL (ref 12.0–15.0)
MCH: 31.6 pg (ref 26.0–34.0)
MCHC: 32.3 g/dL (ref 30.0–36.0)
MCV: 97.6 fL (ref 78.0–100.0)
Platelets: 168 10*3/uL (ref 150–400)
RBC: 4.15 MIL/uL (ref 3.87–5.11)
RDW: 13.1 % (ref 11.5–15.5)
WBC: 4.9 10*3/uL (ref 4.0–10.5)

## 2016-07-10 MED ORDER — DOXYCYCLINE HYCLATE 50 MG PO CAPS: 50.0000 mg | ORAL_CAPSULE | Freq: Two times a day (BID) | ORAL | 0 refills | Status: DC

## 2016-07-10 NOTE — Progress Notes (Signed)
Reviewed discharge paper work and medication with pt, with full understanding

## 2016-07-10 NOTE — Discharge Summary (Signed)
Physician Discharge Summary  Beth Foster [id number] DOB: 02/19/1929 DOA: 07/09/2016  PCP: Hollace Kinnier, DO  Admit date: 07/09/2016 Discharge date: 07/10/2016  Admitted From:Home Disposition: Home    Recommendations for Outpatient Follow-up:  1. Follow up with PCP in 1-2 weeks 2. Please obtain BMP/CBC in one week   Home Health: No Equipment/Devices: No Discharge Condition: Stable CODE STATUS: Full code Diet recommendation: Heart healthy  Brief/Interim Summary:83 y.o. female with medical history significant of hypertension, hemorrhagic stroke, anxiety disorder presented from home with sudden onset of right lower extremity pain, erythema. The patient woke up in the morning with severe right lower extremity pain associated with redness and increased warmth. Denied fever, chills, nausea vomiting chest pain or shortness of breath.  In the ER, CT angiography of lower extremities consistent with bilateral lower extremities venous insufficiency. I recommended patient to follow-up with her vascular surgeon as an outpatient. -X-ray of right lower extremities consistent with soft tissue edema possibly due to cellulitis -Doppler ultrasound of right lower extremities preliminary result with no DVTs  Patient was treated with IV vancomycin with improvement in the rash, pain. Patient reported that she walked today without any difficulties. Also requested to evaluate by physical therapist before discharge. Patient is very eager to go home today. She has no fever, no leukocytosis. The redness in her leg significantly improved.  Patient has penicillin and clindamycin listed as allergy with anaphylaxis. I'm discharging with oral doxycycline for 5 more days. I advised patient to follow-up with her PCP. Resume home medicine for the management of her hypertension. At this time patient is medically stable to transfer her care to outpatient. I discussed the discharge planning with the patient and her grandson  and daughter at bedside. Recommended to take yogurt or probiotics especially while on antibiotics.  Discharge Diagnoses:  Active Problems:   Cellulitis   Cellulitis of right lower extremity    Discharge Instructions  Discharge Instructions    Call MD for:  difficulty breathing, headache or visual disturbances    Complete by:  As directed    Call MD for:  extreme fatigue    Complete by:  As directed    Call MD for:  hives    Complete by:  As directed    Call MD for:  persistant dizziness or light-headedness    Complete by:  As directed    Call MD for:  persistant nausea and vomiting    Complete by:  As directed    Call MD for:  severe uncontrolled pain    Complete by:  As directed    Call MD for:  temperature >100.4    Complete by:  As directed    Diet - low sodium heart healthy    Complete by:  As directed    Discharge instructions    Complete by:  As directed    Please take yogurt or probiotics while on antibiotics.   Increase activity slowly    Complete by:  As directed      Allergies as of 07/10/2016      Reactions   Amoxicillin Anaphylaxis, Other (See Comments)   Has patient had a PCN reaction causing immediate rash, facial/tongue/throat swelling, SOB or lightheadedness with hypotension: Yes Has patient had a PCN reaction causing severe rash involving mucus membranes or skin necrosis: No Has patient had a PCN reaction that required hospitalization No Has patient had a PCN reaction occurring within the last 10 years: No If all of the above answers are "  NO", then may proceed with Cephalosporin use.   Angiotensin Receptor Blockers Anaphylaxis, Itching, Rash   Beta Adrenergic Blockers Anaphylaxis, Itching, Rash   Cephalexin Anaphylaxis   Clindamycin/lincomycin Anaphylaxis, Itching, Rash   Eggs Or Egg-derived Products Other (See Comments)   Reaction:  Blisters in mouth    Tape Other (See Comments)   Reaction:  Pulls skin off    Corticosteroids Itching, Rash    Hydrocortisone Itching, Rash   Latex Itching, Rash   Other Itching, Rash, Other (See Comments)   Pt states that she has a pine allergy and she is only able to use fragrance free soaps and laundry products.     Reglan [metoclopramide] Itching, Rash      Medication List    TAKE these medications   acetaminophen 325 MG tablet Commonly known as:  TYLENOL Take 325 mg by mouth every 6 (six) hours as needed for mild pain or headache.   cholecalciferol 1000 units tablet Commonly known as:  VITAMIN D Take 1,000 Units by mouth daily.   diltiazem 240 MG 24 hr capsule Commonly known as:  DILACOR XR TAKE 1 CAPSULE BY MOUTH DAILY   doxycycline 50 MG capsule Commonly known as:  VIBRAMYCIN Take 1 capsule (50 mg total) by mouth 2 (two) times daily.   furosemide 20 MG tablet Commonly known as:  LASIX Take 20 mg by mouth daily as needed for edema.   multivitamin with minerals Tabs tablet Take 1 tablet by mouth daily.      Follow-up Information    REED, TIFFANY, DO. Schedule an appointment as soon as possible for a visit in 1 week(s).   Specialty:  Geriatric Medicine Contact information: [address] [place] [address] [phone]          Allergies  Allergen Reactions  . Amoxicillin Anaphylaxis and Other (See Comments)    Has patient had a PCN reaction causing immediate rash, facial/tongue/throat swelling, SOB or lightheadedness with hypotension: Yes Has patient had a PCN reaction causing severe rash involving mucus membranes or skin necrosis: No Has patient had a PCN reaction that required hospitalization No Has patient had a PCN reaction occurring within the last 10 years: No If all of the above answers are "NO", then may proceed with Cephalosporin use.  . Angiotensin Receptor Blockers Anaphylaxis, Itching and Rash  . Beta Adrenergic Blockers Anaphylaxis, Itching and Rash  . Cephalexin Anaphylaxis  . Clindamycin/Lincomycin Anaphylaxis, Itching and Rash  . Eggs Or  Egg-Derived Products Other (See Comments)    Reaction:  Blisters in mouth   . Tape Other (See Comments)    Reaction:  Pulls skin off   . Corticosteroids Itching and Rash  . Hydrocortisone Itching and Rash  . Latex Itching and Rash  . Other Itching, Rash and Other (See Comments)    Pt states that she has a pine allergy and she is only able to use fragrance free soaps and laundry products.    . Reglan [Metoclopramide] Itching and Rash    Consultations: No  Procedures/Studies: , CAT scan, Ultrasound  Subjective: Patient was seen and examined at bedside. She reported feeling much better. Denied fever, chills, headache, dizziness, nausea, vomiting, chest pain, shortness of breath. Right leg redness and pain significantly improved. She was able to ambulate. Patient's granddaughter at bedside. She verbalized understanding of follow-up instructions.  Discharge Exam: Vitals:   07/09/16 2059 07/10/16 0447  BP: 133/65 (!) 162/67  Pulse: 93 81  Resp:    Temp: 98.5 F (36.9  C) 97.8 F (36.6 C)   Vitals:   07/09/16 1730 07/09/16 1801 07/09/16 2059 07/10/16 0447  BP: 151/72 (!) 152/67 133/65 (!) 162/67  Pulse: 98 97 93 81  Resp:  18    Temp:  98.4 F (36.9 C) 98.5 F (36.9 C) 97.8 F (36.6 C)  TempSrc:  Oral Oral Oral  SpO2: 94% 95% 94% 98%  Weight:      Height:        General: Pt is alert, awake, not in acute distress Cardiovascular: RRR, S1/S2 +, no rubs, no gallops Respiratory: CTA bilaterally, no wheezing, no rhonchi Abdominal: Soft, NT, ND, bowel sounds + Extremities: no edema, no cyanosis. Right leg redness significantly improved. No tenderness and no edema. She has superficial dilated veins, recommended to follow up with vascular surgeon as an outpatient.  also educated to use stockings and elevate legs while in bed.    The results of significant diagnostics from this hospitalization (including imaging, microbiology, ancillary and laboratory) are listed below for  reference.     Microbiology: No results found for this or any previous visit (from the past 240 hour(s)).   Labs: BNP (last 3 results) No results for input(s): BNP in the last 8760 hours. Basic Metabolic Panel:  Recent Labs Lab 07/09/16 1120 07/10/16 0412  NA 140 138  K 4.4 3.6  CL 103 106  CO2 26 24  GLUCOSE 111* 111*  BUN 9 7  CREATININE 0.73 0.63  CALCIUM 9.6 8.7*   Liver Function Tests: No results for input(s): AST, ALT, ALKPHOS, BILITOT, PROT, ALBUMIN in the last 168 hours. No results for input(s): LIPASE, AMYLASE in the last 168 hours. No results for input(s): AMMONIA in the last 168 hours. CBC:  Recent Labs Lab 07/09/16 1120 07/10/16 0412  WBC 6.0 4.9  NEUTROABS 4.3  --   HGB 13.5 13.1  HCT 41.7 40.5  MCV 96.8 97.6  PLT 185 168   Cardiac Enzymes: No results for input(s): CKTOTAL, CKMB, CKMBINDEX, TROPONINI in the last 168 hours. BNP: Invalid input(s): POCBNP CBG: No results for input(s): GLUCAP in the last 168 hours. D-Dimer No results for input(s): DDIMER in the last 72 hours. Hgb A1c No results for input(s): HGBA1C in the last 72 hours. Lipid Profile No results for input(s): CHOL, HDL, LDLCALC, TRIG, CHOLHDL, LDLDIRECT in the last 72 hours. Thyroid function studies No results for input(s): TSH, T4TOTAL, T3FREE, THYROIDAB in the last 72 hours.  Invalid input(s): FREET3 Anemia work up No results for input(s): VITAMINB12, FOLATE, FERRITIN, TIBC, IRON, RETICCTPCT in the last 72 hours. Urinalysis    Component Value Date/Time   COLORURINE STRAW (A) 01/10/2015 0941   APPEARANCEUR CLEAR 01/10/2015 0941   LABSPEC 1.009 01/10/2015 0941   PHURINE 7.5 01/10/2015 0941   GLUCOSEU 100 (A) 01/10/2015 0941   HGBUR NEGATIVE 01/10/2015 0941   BILIRUBINUR NEGATIVE 01/10/2015 0941   KETONESUR NEGATIVE 01/10/2015 0941   PROTEINUR NEGATIVE 01/10/2015 0941   UROBILINOGEN 0.2 01/10/2015 0941   NITRITE NEGATIVE 01/10/2015 0941   LEUKOCYTESUR NEGATIVE 01/10/2015  0941   Sepsis Labs Invalid input(s): PROCALCITONIN,  WBC,  LACTICIDVEN Microbiology No results found for this or any previous visit (from the past 240 hour(s)).   Time coordinating discharge: 32 minutes  SIGNED:   Rosita Fire, MD  [facility] 07/10/2016, 10:43 AM  If 7PM-7AM, please contact night-coverage [email or web address] Password [email or web address]

## 2016-07-10 NOTE — Evaluation (Signed)
Occupational Therapy Evaluation and Discharge Patient Details Name: Beth Foster MRN: [id number] DOB: 1928/06/07 Today's Date: 07/10/2016    History of Present Illness Pt is a 83 y.o. female presenting with cellulitis of RLE with medical history significant of hypertension, hemorrhagic stroke, anxiety disorder presented from home with sudden onset of right lower extremity pain, erythema. No DVT with doppler.    Clinical Impression   PTA Pt independent in ADL and mobility with quad cane/RW. Pt currently mod I for ADL and supervision for mobility with RW and quad cane (both used during session). OT education complete and OT to sign off at this point. Thank you for this referral.     Follow Up Recommendations  No OT follow up;Supervision - Intermittent    Equipment Recommendations  None recommended by OT    Recommendations for Other Services       Precautions / Restrictions Precautions Precautions: Fall Restrictions Weight Bearing Restrictions: No      Mobility Bed Mobility Overal bed mobility: Modified Independent             General bed mobility comments: increased time required  Transfers Overall transfer level: Needs assistance Equipment used: Rolling walker (2 wheeled);Quad cane Transfers: Sit to/from Stand Sit to Stand: [employer] transfer comment: for safety, no assist needed    Balance Overall balance assessment: Needs assistance Sitting-balance support: No upper extremity supported;Feet supported Sitting balance-Leahy Scale: Good Sitting balance - Comments: EOB with no back support for dressing   Standing balance support: Single extremity supported;During functional activity Standing balance-Leahy Scale: Fair                              ADL Overall ADL's : Modified independent                                       General ADL Comments: Increased time required, but able to perform toilet transfer,  pero care, grooming at sink, and dress whole body without assistance     Vision Vision Assessment?: No apparent visual deficits   Perception     Praxis      Pertinent Vitals/Pain Pain Assessment: 0-10 Pain Score: 1  Pain Location: RLE Pain Descriptors / Indicators: Dull Pain Intervention(s): Monitored during session;Premedicated before session;Repositioned     Hand Dominance Right   Extremity/Trunk Assessment Upper Extremity Assessment Upper Extremity Assessment: Overall WFL for tasks assessed;Generalized weakness   Lower Extremity Assessment Lower Extremity Assessment: Overall WFL for tasks assessed;Generalized weakness   Cervical / Trunk Assessment Cervical / Trunk Assessment: Kyphotic   Communication Communication Communication: No difficulties   Cognition Arousal/Alertness: Awake/alert Behavior During Therapy: WFL for tasks assessed/performed Overall Cognitive Status: Within Functional Limits for tasks assessed                     General Comments       Exercises       Shoulder Instructions      Home Living Family/patient expects to be discharged to:: Private residence Living Arrangements: Alone Available Help at Discharge: Family;Available PRN/intermittently Type of Home: House Home Access: Stairs to enter [employer] of Steps: 2 Entrance Stairs-Rails: Right;Left;Can reach both Home Layout: One level     Bathroom Shower/Tub: Tub/shower unit Shower/tub characteristics: Architectural technologist: Standard Bathroom Accessibility:  No   Home Equipment: Shower seat;Grab bars - tub/shower;Toilet riser;Hand held Tourist information centre manager - 4 wheels;Cane - quad          Prior Functioning/Environment Level of Independence: Independent with assistive device(s)        Comments: used a quad cane and RW from time to time        OT Problem List: Impaired balance (sitting and/or standing)   OT Treatment/Interventions:      OT  Goals(Current goals can be found in the care plan section) Acute Rehab OT Goals Patient Stated Goal: to get home ASAP OT Goal Formulation: With patient/family Time For Goal Achievement: 07/17/16 Potential to Achieve Goals: Good  OT Frequency:     Barriers to D/C:            Co-evaluation              End of Session Equipment Utilized During Treatment: Gait belt;Rolling walker (Quad cane) Nurse Communication: Mobility status;Other (comment) (OT complete)  Activity Tolerance: Patient tolerated treatment well Patient left: in chair;with call bell/phone within reach;with family/visitor present   Time: [id number] OT Time Calculation (min): 42 min Charges:  OT General Charges $OT Visit: 1 Procedure OT Evaluation $OT Eval Low Complexity: 1 Procedure OT Treatments $Self Care/Home Management : 23-37 mins G-Codes: OT G-codes **NOT FOR INPATIENT CLASS** Functional Assessment Tool Used: Clinical Judgement Functional Limitation: Self care Self Care Current Status [id number]): At least 1 percent but less than 20 percent impaired, limited or restricted Self Care Goal Status [id number]): At least 1 percent but less than 20 percent impaired, limited or restricted Self Care Discharge Status [phone]): At least 1 percent but less than 20 percent impaired, limited or restricted  Jaci Carrel 07/10/2016, 10:46 AM  Hulda Humphrey OTR/L [phone]

## 2016-07-10 NOTE — Progress Notes (Signed)
Physical Therapy Evaluation  Clinical Impression: Pt presents with the below diagnosis and deficits. Pt is now moving at baseline level of functioning, but would benefit from HHPT referral in order to improve balance and gait to assist with aging at home. Pt is able to perform gait x 500' with quad cane and perform stair negotiation with Mod I at this time. Pt does not require any further acute therapy follow up. All questions were answered and education was completed.    07/10/16 1454  PT Visit Information  Last PT Received On 07/10/16  Assistance Needed +1  History of Present Illness Pt is a 83 y.o. female presenting with cellulitis of RLE with medical history significant of hypertension, hemorrhagic stroke, anxiety disorder presented from home with sudden onset of right lower extremity pain, erythema. No DVT with doppler.   Precautions  Precautions Fall  Restrictions  Weight Bearing Restrictions No  Home Living  Family/patient expects to be discharged to: Private residence  Living Arrangements Alone  Available Help at Discharge Family;Available PRN/intermittently  Type of Home House  Home Access Stairs to enter  Entrance Stairs-Number of Steps 2  Entrance Stairs-Rails Right;Left;Can reach both  Home Layout One level  Bathroom Biomedical scientist No  Home Merchant navy officer seat;Grab bars - tub/shower;Toilet riser;Hand held Tourist information centre manager - 4 wheels;Cane - quad  Prior Function  Level of Independence Independent with assistive device(s)  Comments used a quad cane and RW from time to time  Communication  Communication No difficulties  Pain Assessment  Pain Assessment 0-10  Pain Score 1  Pain Location RLE  Pain Descriptors / Indicators Dull  Pain Intervention(s) Monitored during session;Premedicated before session  Cognition  Arousal/Alertness Awake/alert  Behavior During Therapy WFL for tasks assessed/performed   Overall Cognitive Status Within Functional Limits for tasks assessed  Upper Extremity Assessment  Upper Extremity Assessment Defer to OT evaluation  Lower Extremity Assessment  Lower Extremity Assessment Overall WFL for tasks assessed  Cervical / Trunk Assessment  Cervical / Trunk Assessment Kyphotic  Bed Mobility  General bed mobility comments sitting in recliner when PT arrives  Transfers  Overall transfer level Needs assistance  Equipment used Quad cane  Transfers Sit to/from Stand  Sit to [employer] guard  General transfer comment for safety, no assist needed  Ambulation/Gait  Ambulation/Gait assistance Supervision  Ambulation Distance (Feet) 500 Feet  Assistive device Quad cane  Gait Pattern/deviations Step-through pattern;Antalgic  General Gait Details Mild antalgic gait due to increased discomfort RLE initially. Gait improved to trace following 2 minute rest break.   Gait velocity decreased  Gait velocity interpretation Below normal speed for age/gender  Stairs Yes  Stairs assistance Modified independent (Device/Increase time)  Stair Management One rail Right;Forwards;Step to pattern  Number of Stairs 2  General stair comments good sequencing  Balance  Overall balance assessment Needs assistance  Sitting-balance support No upper extremity supported;Feet supported  Sitting balance-Leahy Scale Good  Sitting balance - Comments Edge of recliner and mat without no back support  Standing balance support Single extremity supported;During functional activity  Standing balance-Leahy Scale Fair  PT - End of Session  Equipment Utilized During Treatment Gait belt  Activity Tolerance Patient tolerated treatment well  Patient left in chair;with call bell/phone within reach;with family/visitor present  Nurse Communication Mobility status  PT Assessment  PT Recommendation/Assessment All further PT needs can be met in the next venue of care  PT Problem List Decreased  balance;Decreased mobility  No Skilled PT All education completed;Patient at baseline level of functioning  PT Recommendation  Follow Up Recommendations Home health PT  PT equipment None recommended by PT  Individuals Consulted  Consulted and Agree with Results and Recommendations Patient;Family member/caregiver  Family Member Consulted grand daughter  Acute Rehab PT Goals  Patient Stated Goal to get home ASAP  PT Goal Formulation All assessment and education complete, DC therapy  PT Time Calculation  PT Start Time (ACUTE ONLY) 1037  PT Stop Time (ACUTE ONLY) 1056  PT Time Calculation (min) (ACUTE ONLY) 19 min  PT G-Codes **NOT FOR INPATIENT CLASS**  Functional Assessment Tool Used clinical judegement, functional mobility assessment  Functional Limitation Mobility: Walking and moving around  Mobility: Walking and Moving Around Current Status (F9579) CI  Mobility: Walking and Moving Around Goal Status (G0920) CI  Mobility: Walking and Moving Around Discharge Status (Y4159) CI  PT General Charges  $$ ACUTE PT VISIT 1 Procedure  PT Evaluation  $PT Eval Low Complexity 1 Procedure  Written Expression  Dominant Hand Right   Scheryl Marten PT, DPT  [phone]

## 2016-07-11 ENCOUNTER — Telehealth: Payer: Self-pay

## 2016-07-11 NOTE — Telephone Encounter (Signed)
Transition Care Management Follow-Up Telephone Call   Date discharged and where: Beth Foster; 07/10/16  How have you been since you were released from the hospital? Spoke with pt's POA/Granddaughter, Beth Foster. She says that the pt is still having a lot of pain in her right leg. She was put on antibiotics but they did not give her any pain meds at the hospital. She also states that over the past several wks, she and other family members have noticed the pt having hallucinations. She wanted Dr. Mariea Clonts to be aware of.   Any patient concerns?   Items Reviewed:   Meds: Y  Allergies: Y   Dietary Changes Reviewed: Y  Functional Questionnaire:  Independent-I Dependent-D  ADLs:   Dressing- I    Eating-I   Maintaining continence-I  Transferring-I   Transportation-I   Meal Prep-I   Managing Meds- I  Confirmed importance and Date/Time of follow-up visits scheduled: 07/15/16 with Dr. Mariea Clonts  Confirmed with patient if condition worsens to call PCP or go to the Emergency Dept. Patient was given office number and encouraged to call back with questions or concerns: Darreld Mclean

## 2016-07-15 ENCOUNTER — Encounter: Payer: Self-pay | Admitting: Internal Medicine

## 2016-07-15 ENCOUNTER — Ambulatory Visit (INDEPENDENT_AMBULATORY_CARE_PROVIDER_SITE_OTHER): Payer: Medicare Other | Admitting: Internal Medicine

## 2016-07-15 VITALS — BP 138/70 | HR 79 | Temp 97.8°F | Wt 153.0 lb

## 2016-07-15 DIAGNOSIS — N3281 Overactive bladder: Secondary | ICD-10-CM | POA: Diagnosis not present

## 2016-07-15 DIAGNOSIS — G3184 Mild cognitive impairment, so stated: Secondary | ICD-10-CM | POA: Diagnosis not present

## 2016-07-15 DIAGNOSIS — I10 Essential (primary) hypertension: Secondary | ICD-10-CM

## 2016-07-15 DIAGNOSIS — R441 Visual hallucinations: Secondary | ICD-10-CM

## 2016-07-15 DIAGNOSIS — L03115 Cellulitis of right lower limb: Secondary | ICD-10-CM

## 2016-07-15 DIAGNOSIS — I872 Venous insufficiency (chronic) (peripheral): Secondary | ICD-10-CM | POA: Insufficient documentation

## 2016-07-15 MED ORDER — MIRABEGRON ER 25 MG PO TB24: 25.0000 mg | ORAL_TABLET | Freq: Every day | ORAL | 5 refills | Status: DC

## 2016-07-15 NOTE — Progress Notes (Signed)
Location:  [facility] clinic Provider: Maelys Kinnick L. Mariea Clonts, D.O., C.M.D.  Code Status: DNR Goals of Care:  Advanced Directives 07/15/2016  Does Patient Have a Medical Advance Directive? Yes  Type of Paramedic of [place];Living will  Does patient want to make changes to medical advance directive? -  Copy of [place] in Chart? Yes  Would patient like information on creating a medical advance directive? -  Pre-existing out of facility DNR order (yellow form or pink MOST form) -     Chief Complaint  Patient presents with  . Transitions Of Care    07/09/2016 - 07/10/2016 (26 hours)    HPI: Patient is a 83 y.o. female seen today for hospital follow-up s/p admission from 2/17-2/18/18 for right lower extremity severe swelling, pain, erythema and was diagnosed with cellulitis.  She had a CTA of her lower extremities which showed venous insufficiency.  She is to f/u with vascular surgery as outpatient.  xrays showed soft tissue edema and right lower extremity venous doppler had no DVTs.  She was treated with IV vanc with improvement in her rash, pain.  She was then sent home on doxycycline for 5 more days.  Probiotics were also advised.  Her granddaughter called since then pointing out that Beth Foster has been having hallucinations recently.  Saw paint chips falling off the wall to the floor and also thought there was a bug crawling up the wall that was not there.    Says Mr. Marylou Mccoy help her to have a bm if she's constipated.  If she eats healthily, she does ok, but if she skips a meal or does not eat enough veggies, it happens.    Past Medical History:  Diagnosis Date  . Anxiety state, unspecified   . Bunion   . Contact dermatitis and other eczema due to other chemical products   . Edema   . External hemorrhoids without mention of complication   . GERD (gastroesophageal reflux disease)   . Hypertension   . Malignant neoplasm of breast (female), unspecified  site dx'd 1996   rt breast; xrt   . Pain in joint, pelvic region and thigh   . Rash and other nonspecific skin eruption   . Stroke ([place])   . Unspecified cataract   . Unspecified late effects of cerebrovascular disease   . Viral hepatitis A without mention of hepatic coma   . Viral hepatitis A without mention of hepatic coma   . Vitamin D deficiency     Past Surgical History:  Procedure Laterality Date  . BREAST LUMPECTOMY  1995  . CARDIAC CATHETERIZATION  2004  . CHOLECYSTECTOMY  1972    Allergies  Allergen Reactions  . Amoxicillin Anaphylaxis and Other (See Comments)    Has patient had a PCN reaction causing immediate rash, facial/tongue/throat swelling, SOB or lightheadedness with hypotension: Yes Has patient had a PCN reaction causing severe rash involving mucus membranes or skin necrosis: No Has patient had a PCN reaction that required hospitalization No Has patient had a PCN reaction occurring within the last 10 years: No If all of the above answers are "NO", then may proceed with Cephalosporin use.  . Angiotensin Receptor Blockers Anaphylaxis, Itching and Rash  . Beta Adrenergic Blockers Anaphylaxis, Itching and Rash  . Cephalexin Anaphylaxis  . Clindamycin/Lincomycin Anaphylaxis, Itching and Rash  . Eggs Or Egg-Derived Products Other (See Comments)    Reaction:  Blisters in mouth   . Tape Other (See Comments)  Reaction:  Pulls skin off   . Corticosteroids Itching and Rash  . Hydrocortisone Itching and Rash  . Latex Itching and Rash  . Other Itching, Rash and Other (See Comments)    Pt states that she has a pine allergy and she is only able to use fragrance free soaps and laundry products.    . Reglan [Metoclopramide] Itching and Rash    Allergies as of 07/15/2016      Reactions   Amoxicillin Anaphylaxis, Other (See Comments)   Has patient had a PCN reaction causing immediate rash, facial/tongue/throat swelling, SOB or lightheadedness with hypotension: Yes Has  patient had a PCN reaction causing severe rash involving mucus membranes or skin necrosis: No Has patient had a PCN reaction that required hospitalization No Has patient had a PCN reaction occurring within the last 10 years: No If all of the above answers are "NO", then may proceed with Cephalosporin use.   Angiotensin Receptor Blockers Anaphylaxis, Itching, Rash   Beta Adrenergic Blockers Anaphylaxis, Itching, Rash   Cephalexin Anaphylaxis   Clindamycin/lincomycin Anaphylaxis, Itching, Rash   Eggs Or Egg-derived Products Other (See Comments)   Reaction:  Blisters in mouth    Tape Other (See Comments)   Reaction:  Pulls skin off    Corticosteroids Itching, Rash   Hydrocortisone Itching, Rash   Latex Itching, Rash   Other Itching, Rash, Other (See Comments)   Pt states that she has a pine allergy and she is only able to use fragrance free soaps and laundry products.     Reglan [metoclopramide] Itching, Rash      Medication List       Accurate as of 07/15/16 11:16 AM. Always use your most recent med list.          acetaminophen 325 MG tablet Commonly known as:  TYLENOL Take 325 mg by mouth every 6 (six) hours as needed for mild pain or headache.   cholecalciferol 1000 units tablet Commonly known as:  VITAMIN D Take 1,000 Units by mouth daily.   diltiazem 240 MG 24 hr capsule Commonly known as:  DILACOR XR TAKE 1 CAPSULE BY MOUTH DAILY   furosemide 20 MG tablet Commonly known as:  LASIX Take 20 mg by mouth daily as needed for edema.   multivitamin with minerals Tabs tablet Take 1 tablet by mouth daily.       Review of Systems:  Review of Systems  Constitutional: Negative for chills, fever and malaise/fatigue.  HENT: Negative for congestion and hearing loss.   Eyes: Negative for blurred vision.  Respiratory: Negative for shortness of breath.   Cardiovascular: Negative for chest pain, palpitations and leg swelling.  Gastrointestinal: Negative for abdominal pain.    Genitourinary: Negative for dysuria.  Musculoskeletal: Negative for falls.  Skin: Negative for rash.  Neurological: Negative for dizziness, loss of consciousness and weakness.  Endo/Heme/Allergies: Bruises/bleeds easily.  Psychiatric/Behavioral: Positive for hallucinations and memory loss. Negative for depression. The patient is not nervous/anxious and does not have insomnia.     Health Maintenance  Topic Date Due  . DEXA SCAN  10/23/1995  . PNA vac Low Risk Adult (1 of 2 - PCV13) 10/23/1995  . TETANUS/TDAP  01/09/2025  . INFLUENZA VACCINE  Completed    Physical Exam: Vitals:   07/15/16 1048  BP: 138/70  Pulse: 79  Temp: 97.8 F (36.6 C)  TempSrc: Oral  SpO2: 96%  Weight: 153 lb (69.4 kg)   Body mass index is 26.26 kg/m. Physical Exam  Constitutional:  She appears well-developed and well-nourished. No distress.  Cardiovascular: Normal rate, regular rhythm, normal heart sounds and intact distal pulses.   Pulmonary/Chest: Effort normal and breath sounds normal. No respiratory distress.  Abdominal: Soft. Bowel sounds are normal.  Musculoskeletal: Normal range of motion.  Unsteady gait, uses cane  Neurological: She is alert. No cranial nerve deficit.  Oriented to person and place, not time; short term memory loss  Skin: Skin is warm and dry. Capillary refill takes less than 2 seconds. No rash noted. No erythema.  Has chronic stasis derm hyperpigmentation of bilateral legs (mild), no significant swelling now  Psychiatric: She has a normal mood and affect.    Labs reviewed: Basic Metabolic Panel:  Recent Labs  12/19/15 2112 07/09/16 1120 07/10/16 0412  NA 137 140 138  K 3.6 4.4 3.6  CL 105 103 106  CO2 23 26 24   GLUCOSE 106* 111* 111*  BUN 11 9 7   CREATININE 0.58 0.73 0.63  CALCIUM 9.1 9.6 8.7*   Liver Function Tests: No results for input(s): AST, ALT, ALKPHOS, BILITOT, PROT, ALBUMIN in the last 8760 hours. No results for input(s): LIPASE, AMYLASE in the last  8760 hours. No results for input(s): AMMONIA in the last 8760 hours. CBC:  Recent Labs  12/19/15 2112 01/01/16 1206 07/09/16 1120 07/10/16 0412  WBC 4.9 5.0 6.0 4.9  NEUTROABS 2.5 2,950 4.3  --   HGB 13.9 13.6 13.5 13.1  HCT 41.3 40.0 41.7 40.5  MCV 96.9 93.7 96.8 97.6  PLT 183 216 185 168   Lipid Panel: No results for input(s): CHOL, HDL, LDLCALC, TRIG, CHOLHDL, LDLDIRECT in the last 8760 hours. Lab Results  Component Value Date   HGBA1C 6.0 (H) 03/19/2015    Procedures since last visit: Dg Tibia/fibula Right  Result Date: 07/09/2016 CLINICAL DATA:  Patient reports severe pain, swelling and possible knot on the medial side of right tib/fib. Patient reports pain is on her right foot as well radiates to her right tib/fib. EXAM: RIGHT TIBIA AND FIBULA - 2 VIEW COMPARISON:  None. FINDINGS: No fracture. No bone lesion. No bone resorption is seen to suggest osteomyelitis. The skeletal structures are demineralized. The knee and ankle joints are normally spaced and aligned. There is subcutaneous soft tissue edema of the mid to lower leg. No radiopaque foreign body. No soft tissue air. IMPRESSION: 1. No fracture, bone lesion or dislocation. 2. Mid to lower leg soft tissue edema. No soft tissue air or radiopaque foreign body. Electronically Signed   By: Lajean Manes M.D.   On: 07/09/2016 11:10   Ct Angio Ao+bifem W & Or Wo Contrast  Result Date: 07/09/2016 CLINICAL DATA:  Right leg erythema EXAM: CT ANGIOGRAPHY OF ABDOMINAL AORTA WITH ILIOFEMORAL RUNOFF TECHNIQUE: Multidetector CT imaging of the abdomen, pelvis and lower extremities was performed using the standard protocol during bolus administration of intravenous contrast. Multiplanar CT image reconstructions and MIPs were obtained to evaluate the vascular anatomy. CONTRAST:  100 cc Isovue 370 COMPARISON:  None. FINDINGS: Aorta is non aneurysmal and patent with mild atherosclerotic calcification Celiac is patent. There is mild  atherosclerotic calcification at origin branch vessels are patent. There is a calcified density in the hilum of the spleen which may represent a small aneurysm measuring 8 mm. SMA is patent. Two right renal arteries and 2 left renal arteries are patent. IMA is patent. Bilateral common iliac, external iliac, and internal iliac arteries are patent. Right common femoral, superficial femoral, profunda femoral, and popliteal arteries are patent.  There is 3 vessel runoff to the right ankle. Left common femoral, superficial femoral, profunda femoral, and popliteal arteries are patent with 3 vessel runoff to the left ankle. There are prominent veins within the medial caps bilaterally suggesting venous insufficiency. Furthermore, contrast refluxes into the left greater saphenous vein within the left thigh. The distal thigh left greater saphenous vein is markedly enlarged. Again, this suggests venous insufficiency. Bibasilar scarring versus atelectasis in the lungs. Contour of the liver is slightly nodular suggesting early cirrhotic change. Postcholecystectomy. Pancreas is unremarkable. Spleen is unremarkable Tiny hypodensities in the kidneys are nonspecific. Right adrenal gland is unremarkable. There is a low-density lesion in the left adrenal gland measuring 2.5 cm consistent with a benign adenoma. Bowel and stomach are non-opacified.  No obvious mass in the colon. The bladder is markedly distended. There are small lymph nodes along the aorta. This includes a tiny soft tissue density anterior to the aorta on image 66 of series 7. There is no vertebral compression deformity. Degenerative disc disease in the lumbar spine is noted. There is no free fluid. Review of the MIP images confirms the above findings. IMPRESSION: The aorta and iliac vasculature are patent. Femoral and popliteal arteries are patent. Three vessel runoff bilaterally There are prominent venous structures in the leg suggesting bilateral lower extremity  venous insufficiency. This could account for the patient's symptoms. Early cirrhotic change in the liver Bladder is markedly distended Benign left adrenal adenoma. Electronically Signed   By: Marybelle Killings M.D.   On: 07/09/2016 15:32   Dg Foot Complete Right  Result Date: 07/09/2016 CLINICAL DATA:  Patient reports severe pain, swelling and possible knot on the medial side of right tib/fib. Patient reports pain is on her right foot as well radiates to her right tib/fib. EXAM: RIGHT FOOT COMPLETE - 3+ VIEW COMPARISON:  None. FINDINGS: No fracture or dislocation. There is bony prominence from the medial first metatarsal head consistent with a bunion, as well as a significant hallux valgus deformity. Bones are demineralized. There is mild subcutaneous soft tissue edema of the midfoot and ankle. No soft tissue air. IMPRESSION: 1. No fracture, dislocation or acute skeletal abnormality. Electronically Signed   By: Lajean Manes M.D.   On: 07/09/2016 11:11    Assessment/Plan 1. Cellulitis of right lower extremity -resolved, just completed doxy course -discussed importance of notifying her granddaughters when this begins not waiting until it's so bad (was so bad she could not walk on the right leg)  2. Venous insufficiency -counseled on elevation of feet at rest to prevent swelling and increase hyperpigmentation changes -already knew she had this classic picture -I don't see a benefit of her seeing vascular surgery when she would not have surgery at this point anyway--her granddaughter seemed to agree and pt adamantly said she would not have surgery unless it were an emergent situation  3. Visual hallucinations -new problem over past few months -pt also missed appt here with me (I think she failed to make it) -saw bug on wall and paint crumbling to the floor from the ceiling and wall that her family did not see --discussed that unless this becomes frightening to her or starts to interfere with her  functioning, we typically do not treat psychosis in dementia  4. Mild cognitive impairment with memory loss -seems this is progressing to a true dementia with more functional impairment and now psychosis -will reassess with wellness visit and determine what additional support she requires -does have an emergency alert button  5. Essential hypertension, benign -bp at goal with current therapy, cont same--was high in ED, but pt was having severe pain and in an unfamiliar environment  6. OAB (overactive bladder) -will start her on myrbetriq 25mg  po daily for this and monitor -discussed risks of other alternative meds in her age group and she and her granddaughter expressed understanding  Labs/tests ordered:  No orders of the defined types were placed in this encounter.  Next appt:  07/21/2016  (keep scheduled appts with labs)  Kyandra Mcclaine L. Adon Gehlhausen, D.O. [facility] [address], [place] [address] Cell Phone (Mon-Fri 8am-5pm):  [phone] On Call:  [phone] & follow prompts after 5pm & weekends Office Phone:  [phone] Office Fax:  [phone]

## 2016-07-21 ENCOUNTER — Ambulatory Visit (INDEPENDENT_AMBULATORY_CARE_PROVIDER_SITE_OTHER): Payer: Medicare Other

## 2016-07-21 ENCOUNTER — Other Ambulatory Visit: Payer: Medicare Other

## 2016-07-21 VITALS — BP 162/80 | HR 90 | Temp 97.8°F | Ht 65.0 in | Wt 153.2 lb

## 2016-07-21 DIAGNOSIS — Z0000 Encounter for general adult medical examination without abnormal findings: Secondary | ICD-10-CM

## 2016-07-21 DIAGNOSIS — G3184 Mild cognitive impairment, so stated: Secondary | ICD-10-CM | POA: Diagnosis not present

## 2016-07-21 DIAGNOSIS — I10 Essential (primary) hypertension: Secondary | ICD-10-CM

## 2016-07-21 LAB — LIPID PANEL
Cholesterol: 179 mg/dL (ref <200)
HDL: 84 mg/dL (ref >50)
LDL Cholesterol: 83 mg/dL (ref <100)
Total CHOL/HDL Ratio: 2.1 Ratio (ref <5.0)
Triglycerides: 59 mg/dL (ref <150)
VLDL: 12 mg/dL (ref <30)

## 2016-07-21 LAB — COMPREHENSIVE METABOLIC PANEL
ALT: 15 U/L (ref 6–29)
AST: 19 U/L (ref 10–35)
Albumin: 4.1 g/dL (ref 3.6–5.1)
Alkaline Phosphatase: 60 U/L (ref 33–130)
BUN: 10 mg/dL (ref 7–25)
CO2: 26 mmol/L (ref 20–31)
Calcium: 9.5 mg/dL (ref 8.6–10.4)
Chloride: 105 mmol/L (ref 98–110)
Creat: 0.69 mg/dL (ref 0.60–0.88)
Glucose, Bld: 129 mg/dL — ABNORMAL HIGH (ref 65–99)
Potassium: 4 mmol/L (ref 3.5–5.3)
Sodium: 139 mmol/L (ref 135–146)
Total Bilirubin: 0.4 mg/dL (ref 0.2–1.2)
Total Protein: 7.2 g/dL (ref 6.1–8.1)

## 2016-07-21 LAB — CBC WITH DIFFERENTIAL/PLATELET
Basophils Absolute: 41 cells/uL (ref 0–200)
Basophils Relative: 1 %
Eosinophils Absolute: 82 cells/uL (ref 15–500)
Eosinophils Relative: 2 %
HCT: 41.2 % (ref 35.0–45.0)
Hemoglobin: 13.8 g/dL (ref 11.7–15.5)
Lymphocytes Relative: 32 %
Lymphs Abs: 1312 cells/uL (ref 850–3900)
MCH: 31.7 pg (ref 27.0–33.0)
MCHC: 33.5 g/dL (ref 32.0–36.0)
MCV: 94.5 fL (ref 80.0–100.0)
MPV: 10.2 fL (ref 7.5–12.5)
Monocytes Absolute: 369 cells/uL (ref 200–950)
Monocytes Relative: 9 %
Neutro Abs: 2296 cells/uL (ref 1500–7800)
Neutrophils Relative %: 56 %
Platelets: 215 10*3/uL (ref 140–400)
RBC: 4.36 MIL/uL (ref 3.80–5.10)
RDW: 13.1 % (ref 11.0–15.0)
WBC: 4.1 10*3/uL (ref 3.8–10.8)

## 2016-07-21 NOTE — Progress Notes (Signed)
   I reviewed health advisor's note, was available for consultation and agree with the assessment and plan as written.  We can discuss pneumonia shots at the next visit.  I believe she has historically refused them.  I just saw her and she did not mention coughing.  They did tell me about the hallucinations.  We discussed that this is another sign of dementia, but we typically manage it w/o medication unless it becomes scary or interferes significantly with her quality of life.  Melayna Robarts L. Altie Savard, D.O. [facility] [address], [place] [address] Cell Phone (Mon-Fri 8am-5pm):  [phone] On Call:  [phone] & follow prompts after 5pm & weekends Office Phone:  [phone] Office Fax:  [phone]   Quick Notes   Health Maintenance:  Pt would like to discuss Pneumonia with you.    Abnormal Screen: MMSE 29/30 passed clock test    Patient Concerns:  Pt has been coughing x 2 mos now and would like to have it checked out. Pt's granddaughter also states pt has been having some hallucinations.     Nurse Concerns:  None

## 2016-07-21 NOTE — Patient Instructions (Addendum)
Beth Foster , Thank you for taking time to come for your Medicare Wellness Visit. I appreciate your ongoing commitment to your health goals. Please review the following plan we discussed and let me know if I can assist you in the future.   These are the goals we discussed: Goals    . <enter goal here>          Starting 07/21/16, I will maintain my current lifestyle.        This is a list of the screening recommended for you and due dates:  Health Maintenance  Topic Date Due  . DEXA scan (bone density measurement)  07/21/2017*  . Pneumonia vaccines (1 of 2 - PCV13) 07/21/2017*  . Tetanus Vaccine  01/09/2025  . Flu Shot  Completed  *Topic was postponed. The date shown is not the original due date.  Preventive Care for Adults  A healthy lifestyle and preventive care can promote health and wellness. Preventive health guidelines for adults include the following key practices.  . A routine yearly physical is a good way to check with your health care provider about your health and preventive screening. It is a chance to share any concerns and updates on your health and to receive a thorough exam.  . Visit your dentist for a routine exam and preventive care every 6 months. Brush your teeth twice a day and floss once a day. Good oral hygiene prevents tooth decay and gum disease.  . The frequency of eye exams is based on your age, health, family medical history, use  of contact lenses, and other factors. Follow your health care provider's ecommendations for frequency of eye exams.  . Eat a healthy diet. Foods like vegetables, fruits, whole grains, low-fat dairy products, and lean protein foods contain the nutrients you need without too many calories. Decrease your intake of foods high in solid fats, added sugars, and salt. Eat the right amount of calories for you. Get information about a proper diet from your health care provider, if necessary.  . Regular physical exercise is one of the most  important things you can do for your health. Most adults should get at least 150 minutes of moderate-intensity exercise (any activity that increases your heart rate and causes you to sweat) each week. In addition, most adults need muscle-strengthening exercises on 2 or more days a week.  Silver Sneakers may be a benefit available to you. To determine eligibility, you may visit the website: www.silversneakers.com or contact program at [phone] Mon-Fri between 8AM-8PM.   . Maintain a healthy weight. The body mass index (BMI) is a screening tool to identify possible weight problems. It provides an estimate of body fat based on height and weight. Your health care provider can find your BMI and can help you achieve or maintain a healthy weight.   For adults 20 years and older: ? A BMI below 18.5 is considered underweight. ? A BMI of 18.5 to 24.9 is normal. ? A BMI of 25 to 29.9 is considered overweight. ? A BMI of 30 and above is considered obese.   . Maintain normal blood lipids and cholesterol levels by exercising and minimizing your intake of saturated fat. Eat a balanced diet with plenty of fruit and vegetables. Blood tests for lipids and cholesterol should begin at age 67 and be repeated every 5 years. If your lipid or cholesterol levels are high, you are over 50, or you are at high risk for heart disease, you may need your  cholesterol levels checked more frequently. Ongoing high lipid and cholesterol levels should be treated with medicines if diet and exercise are not working.  . If you smoke, find out from your health care provider how to quit. If you do not use tobacco, please do not start.  . If you choose to drink alcohol, please do not consume more than 2 drinks per day. One drink is considered to be 12 ounces (355 mL) of beer, 5 ounces (148 mL) of wine, or 1.5 ounces (44 mL) of liquor.  . If you are 61-52 years old, ask your health care provider if you should take aspirin to prevent  strokes.  . Use sunscreen. Apply sunscreen liberally and repeatedly throughout the day. You should seek shade when your shadow is shorter than you. Protect yourself by wearing long sleeves, pants, a wide-brimmed hat, and sunglasses year round, whenever you are outdoors.  . Once a month, do a whole body skin exam, using a mirror to look at the skin on your back. Tell your health care provider of new moles, moles that have irregular borders, moles that are larger than a pencil eraser, or moles that have changed in shape or color.

## 2016-07-21 NOTE — Progress Notes (Signed)
Subjective:   Beth Foster is a 83 y.o. female who presents for an Initial Medicare Annual Wellness Visit.  Review of Systems    Cardiac Risk Factors include: advanced age (>2men, >37 women);sedentary lifestyle;hypertension;family history of premature cardiovascular disease     Objective:    Today's Vitals   07/21/16 0840  BP: (!) 162/80  Pulse: 90  Temp: 97.8 F (36.6 C)  TempSrc: Oral  SpO2: 96%  Weight: 153 lb 3.2 oz (69.5 kg)  Height: 5\' 5"  (1.651 m)   Body mass index is 25.49 kg/m.   Current Medications (verified) Outpatient Encounter Prescriptions as of 07/21/2016  Medication Sig  . acetaminophen (TYLENOL) 325 MG tablet Take 325 mg by mouth every 6 (six) hours as needed for mild pain or headache.  . cholecalciferol (VITAMIN D) 1000 UNITS tablet Take 1,000 Units by mouth daily.   Marland Kitchen diltiazem (DILACOR XR) 240 MG 24 hr capsule TAKE 1 CAPSULE BY MOUTH DAILY  . furosemide (LASIX) 20 MG tablet Take 20 mg by mouth daily as needed for edema.  . mirabegron ER (MYRBETRIQ) 25 MG TB24 tablet Take 1 tablet (25 mg total) by mouth daily.  . Multiple Vitamin (MULTIVITAMIN WITH MINERALS) TABS tablet Take 1 tablet by mouth daily.   No facility-administered encounter medications on file as of 07/21/2016.     Allergies (verified) Amoxicillin; Angiotensin receptor blockers; Beta adrenergic blockers; Cephalexin; Clindamycin/lincomycin; Eggs or egg-derived products; Tape; Corticosteroids; Hydrocortisone; Latex; Other; and Reglan [metoclopramide]   History: Past Medical History:  Diagnosis Date  . Anxiety state, unspecified   . Bunion   . Contact dermatitis and other eczema due to other chemical products   . Edema   . External hemorrhoids without mention of complication   . GERD (gastroesophageal reflux disease)   . Hypertension   . Malignant neoplasm of breast (female), unspecified site dx'd 1996   rt breast; xrt   . Pain in joint, pelvic region and thigh   . Rash and other  nonspecific skin eruption   . Stroke ([place])   . Unspecified cataract   . Unspecified late effects of cerebrovascular disease   . Viral hepatitis A without mention of hepatic coma   . Viral hepatitis A without mention of hepatic coma   . Vitamin D deficiency    Past Surgical History:  Procedure Laterality Date  . BREAST LUMPECTOMY  1995  . CARDIAC CATHETERIZATION  2004  . CHOLECYSTECTOMY  1972   Family History  Problem Relation Age of Onset  . Cancer Mother     lung  . Cancer Brother     brain  . Varicose Veins Father   . Varicose Veins Sister    Social History   Occupational History  . Not on file.   Social History Main Topics  . Smoking status: Never Smoker  . Smokeless tobacco: Never Used  . Alcohol use No  . Drug use: No  . Sexual activity: No    Tobacco Counseling Counseling given: No   Activities of Daily Living In your present state of health, do you have any difficulty performing the following activities: 07/21/2016  Hearing? Y  Vision? Y  Difficulty concentrating or making decisions? Y  Walking or climbing stairs? Y  Dressing or bathing? N  Doing errands, shopping? Y  Preparing Food and eating ? N  Using the Toilet? N  In the past six months, have you accidently leaked urine? Y  Do you have problems with loss of bowel control? N  Managing your Medications? N  Managing your Finances? Y  Housekeeping or managing your Housekeeping? N  Some recent data might be hidden    Immunizations and Health Maintenance Immunization History  Administered Date(s) Administered  . Influenza,inj,Quad PF,36+ Mos 03/14/2013, 02/28/2014, 03/19/2015, 02/11/2016  . Tdap 01/10/2015   There are no preventive care reminders to display for this patient.  Patient Care Team: Gayland Curry, DO as PCP - General (Geriatric Medicine)  Indicate any recent Medical Services you may have received from other than [facility] providers in the past year (date may be approximate).       Assessment:   This is a routine wellness examination for Beth Foster.   Hearing/Vision screen Hearing Screening Comments: Last hearing screen was done several years ago. Pt has noticed a decrease in hearing. Pt declines hearing screen at this time.  Vision Screening Comments: Last eye exam was done in Dec. 2017 with [facility]. Pt has bilateral cataracts. Pt will let us know when she is ready for referral for her cataracts.   Dietary issues and exercise activities discussed: Current Exercise Habits: The patient does not participate in regular exercise at present, Exercise limited by: None identified  Goals    . <enter goal here>          Starting 07/21/16, I will maintain my current lifestyle.       Depression Screen PHQ 2/9 Scores 07/21/2016 07/15/2016 01/01/2016 02/05/2015 07/24/2014 02/28/2014 02/08/2013  PHQ - 2 Score 0 0 0 0 0 0 0    Fall Risk Fall Risk  07/21/2016 07/15/2016 01/01/2016 06/09/2015 04/15/2015  Falls in the past year? No No No No Yes  Number falls in past yr: - - - - 1  Injury with Fall? - - - - Yes  Risk for fall due to : History of fall(s) - - - -    Cognitive Function: MMSE - Mini Mental State Exam 07/21/2016 07/03/2014  Orientation to time 5 5  Orientation to Place 4 5  Registration 3 3  Attention/ Calculation 5 5  Recall 3 3  Language- name 2 objects 2 2  Language- repeat 1 1  Language- follow 3 step command 3 3  Language- read & follow direction 1 1  Write a sentence 1 1  Copy design 1 0  Total score 29 29        Screening Tests Health Maintenance  Topic Date Due  . DEXA SCAN  07/21/2017 (Originally 10/23/1995)  . PNA vac Low Risk Adult (1 of 2 - PCV13) 07/21/2017 (Originally 10/23/1995)  . TETANUS/TDAP  01/09/2025  . INFLUENZA VACCINE  Completed      Plan:    I have personally reviewed and addressed the Medicare Annual Wellness questionnaire and have noted the following in the patient's chart:  A. Medical and social history B. Use of alcohol, tobacco or  illicit drugs  C. Current medications and supplements D. Functional ability and status E.  Nutritional status F.  Physical activity G. Advance directives H. List of other physicians I.  Hospitalizations, surgeries, and ER visits in previous 12 months J.  [facility] to include hearing, vision, cognitive, depression L. Referrals and appointments - none  In addition, I have reviewed and discussed with patient certain preventive protocols, quality metrics, and best practice recommendations. A written personalized care plan for preventive services as well as general preventive health recommendations were provided to patient.  See attached scanned questionnaire for additional information.   Signed,   Allyn Kenner,  LPN Health Advisor

## 2016-07-25 ENCOUNTER — Ambulatory Visit (INDEPENDENT_AMBULATORY_CARE_PROVIDER_SITE_OTHER): Payer: Medicare Other | Admitting: Internal Medicine

## 2016-07-25 ENCOUNTER — Encounter: Payer: Self-pay | Admitting: Internal Medicine

## 2016-07-25 VITALS — BP 136/70 | HR 82 | Temp 97.2°F | Wt 155.0 lb

## 2016-07-25 DIAGNOSIS — H543 Unqualified visual loss, both eyes: Secondary | ICD-10-CM | POA: Diagnosis not present

## 2016-07-25 DIAGNOSIS — I10 Essential (primary) hypertension: Secondary | ICD-10-CM

## 2016-07-25 DIAGNOSIS — Z0000 Encounter for general adult medical examination without abnormal findings: Secondary | ICD-10-CM | POA: Diagnosis not present

## 2016-07-25 DIAGNOSIS — R441 Visual hallucinations: Secondary | ICD-10-CM | POA: Diagnosis not present

## 2016-07-25 DIAGNOSIS — G3184 Mild cognitive impairment, so stated: Secondary | ICD-10-CM

## 2016-07-25 DIAGNOSIS — Z23 Encounter for immunization: Secondary | ICD-10-CM

## 2016-07-25 DIAGNOSIS — I872 Venous insufficiency (chronic) (peripheral): Secondary | ICD-10-CM

## 2016-07-25 DIAGNOSIS — N3281 Overactive bladder: Secondary | ICD-10-CM

## 2016-07-25 NOTE — Progress Notes (Signed)
Provider:  Rexene Edison. Mariea Clonts, D.O., C.M.D. Location:   [facility]   Place of Service:   clinic  Previous PCP: Hollace Kinnier, DO Patient Care Team: Gayland Curry, DO as PCP - General (Geriatric Medicine)  Extended Emergency Contact Information Primary Emergency Contact: [place] Phone: [phone] Mobile Phone: [phone] Relation: Grandaughter  Code Status: DNR  Goals of Care: Advanced Directive information Advanced Directives 07/25/2016  Does Patient Have a Medical Advance Directive? Yes  Type of Advance Directive [place]  Does patient want to make changes to medical advance directive? -  Copy of [place] in Chart? Yes  Would patient like information on creating a medical advance directive? -  Pre-existing out of facility DNR order (yellow form or pink MOST form) -   Chief Complaint  Patient presents with  . Annual Exam    CPE    HPI: Patient is a 83 y.o. Foster seen today for an annual physical exam.  She has already had her wellness visit and was just seen for an ED f/u due to her cellulitis.  We had also discussed some hallucinations she was having.   She is here today with her other granddaughter.  Lives alone and cannot get her compression hose on so takes lasix as needed for edema.    Past Medical History:  Diagnosis Date  . Anxiety state, unspecified   . Bunion   . Contact dermatitis and other eczema due to other chemical products   . Edema   . External hemorrhoids without mention of complication   . GERD (gastroesophageal reflux disease)   . Hypertension   . Malignant neoplasm of breast ([place]), unspecified site dx'd 1996   rt breast; xrt   . Pain in joint, pelvic region and thigh   . Rash and other nonspecific skin eruption   . Stroke ([place])   . Unspecified cataract   . Unspecified late effects of cerebrovascular disease   . Viral hepatitis A without mention of hepatic coma   .  Viral hepatitis A without mention of hepatic coma   . Vitamin D deficiency    Past Surgical History:  Procedure Laterality Date  . BREAST LUMPECTOMY  1995  . CARDIAC CATHETERIZATION  2004  . CHOLECYSTECTOMY  1972    reports that she has never smoked. She has never used smokeless tobacco. She reports that she does not drink alcohol or use drugs.  Functional Status Survey:    Family History  Problem Relation Age of Onset  . Cancer Mother     lung  . Cancer Brother     brain  . Varicose Veins Father   . Varicose Veins Sister     Health Maintenance  Topic Date Due  . DEXA SCAN  07/21/2017 (Originally 10/23/1995)  . PNA vac Low Risk Adult (1 of 2 - PCV13) 07/21/2017 (Originally 10/23/1995)  . TETANUS/TDAP  01/09/2025  . INFLUENZA VACCINE  Completed    Allergies  Allergen Reactions  . Amoxicillin Anaphylaxis and Other (See Comments)    Has patient had a PCN reaction causing immediate rash, facial/tongue/throat swelling, SOB or lightheadedness with hypotension: Yes Has patient had a PCN reaction causing severe rash involving mucus membranes or skin necrosis: No Has patient had a PCN reaction that required hospitalization No Has patient had a PCN reaction occurring within the last 10 years: No If all of the above answers are "NO", then may proceed with Cephalosporin use.  Marland Kitchen  Angiotensin Receptor Blockers Anaphylaxis, Itching and Rash  . Beta Adrenergic Blockers Anaphylaxis, Itching and Rash  . Cephalexin Anaphylaxis  . Clindamycin/Lincomycin Anaphylaxis, Itching and Rash  . Eggs Or Egg-Derived Products Other (See Comments)    Reaction:  Blisters in mouth   . Tape Other (See Comments)    Reaction:  Pulls skin off   . Corticosteroids Itching and Rash  . Hydrocortisone Itching and Rash  . Latex Itching and Rash  . Other Itching, Rash and Other (See Comments)    Pt states that she has a pine allergy and she is only able to use fragrance free soaps and laundry products.    . Reglan  [Metoclopramide] Itching and Rash    Allergies as of 07/25/2016      Reactions   Amoxicillin Anaphylaxis, Other (See Comments)   Has patient had a PCN reaction causing immediate rash, facial/tongue/throat swelling, SOB or lightheadedness with hypotension: Yes Has patient had a PCN reaction causing severe rash involving mucus membranes or skin necrosis: No Has patient had a PCN reaction that required hospitalization No Has patient had a PCN reaction occurring within the last 10 years: No If all of the above answers are "NO", then may proceed with Cephalosporin use.   Angiotensin Receptor Blockers Anaphylaxis, Itching, Rash   Beta Adrenergic Blockers Anaphylaxis, Itching, Rash   Cephalexin Anaphylaxis   Clindamycin/lincomycin Anaphylaxis, Itching, Rash   Eggs Or Egg-derived Products Other (See Comments)   Reaction:  Blisters in mouth    Tape Other (See Comments)   Reaction:  Pulls skin off    Corticosteroids Itching, Rash   Hydrocortisone Itching, Rash   Latex Itching, Rash   Other Itching, Rash, Other (See Comments)   Pt states that she has a pine allergy and she is only able to use fragrance free soaps and laundry products.     Reglan [metoclopramide] Itching, Rash      Medication List       Accurate as of 07/25/16  9:12 AM. Always use your most recent med list.          acetaminophen 325 MG tablet Commonly known as:  TYLENOL Take 325 mg by mouth every 6 (six) hours as needed for mild pain or headache.   cholecalciferol 1000 units tablet Commonly known as:  VITAMIN D Take 1,000 Units by mouth daily.   diltiazem 240 MG 24 hr capsule Commonly known as:  DILACOR XR TAKE 1 CAPSULE BY MOUTH DAILY   furosemide 20 MG tablet Commonly known as:  LASIX Take 20 mg by mouth daily as needed for edema.   mirabegron ER 25 MG Tb24 tablet Commonly known as:  MYRBETRIQ Take 1 tablet (25 mg total) by mouth daily.   multivitamin with minerals Tabs tablet Take 1 tablet by mouth  daily.       Review of Systems  Constitutional: Negative for chills and fever.  HENT: Negative for congestion and hearing loss.   Eyes: Positive for blurred vision. Negative for double vision, photophobia, pain, discharge and redness.       ? If hallucinations vs. Visual problem  Respiratory: Negative for shortness of breath.   Cardiovascular: Positive for leg swelling. Negative for chest pain and palpitations.       Mild nonpitting edema  Genitourinary: Positive for frequency and urgency. Negative for dysuria.       Improved frequency  Musculoskeletal: Negative for falls.       Right leg pain at times, uses cane  Skin: Negative  for itching and rash.       Sebaceous cyst right posterior shoulder    Vitals:   07/25/16 0905  BP: 136/70  Pulse: 82  Temp: 97.2 F (Beth.2 C)  TempSrc: Oral  SpO2: 97%  Weight: 155 lb (70.3 kg)   Body mass index is 25.79 kg/m. Physical Exam  Constitutional: She is oriented to person, place, and time. She appears well-developed and well-nourished. No distress.  HENT:  Head: Normocephalic and atraumatic.  Right Ear: External ear normal.  Left Ear: External ear normal.  Nose: Nose normal.  Mouth/Throat: Oropharynx is clear and moist. No oropharyngeal exudate.  Eyes: Conjunctivae and EOM are normal. Pupils are equal, round, and reactive to light. Right eye exhibits no discharge. Left eye exhibits no discharge. No scleral icterus.  Neck: Normal range of motion. Neck supple. No JVD present.  Cardiovascular: Normal rate, regular rhythm, normal heart sounds and intact distal pulses.   Mild nonpitting edema bilateral LEs  Pulmonary/Chest: Effort normal and breath sounds normal. No respiratory distress. She has no wheezes. She has no rales. Right breast exhibits inverted nipple, mass and skin change. Right breast exhibits no nipple discharge and no tenderness. Left breast exhibits no inverted nipple, no mass, no nipple discharge, no skin change and no  tenderness.  Telangiectasias around site of prior lumpectomy on right, chronic mass since then  Abdominal: Soft. Bowel sounds are normal. She exhibits no distension and no mass. There is tenderness. There is no rebound and no guarding. No hernia.  Left lower quadrant  Musculoskeletal: Normal range of motion. She exhibits no tenderness.  Walking improperly with her quad cane (carries it half the time)  Lymphadenopathy:    She has no cervical adenopathy.  Neurological: She is alert and oriented to person, place, and time. She displays normal reflexes. No cranial nerve deficit or sensory deficit. She exhibits normal muscle tone. Coordination normal.  Skin: Skin is warm and dry. Capillary refill takes less than 2 seconds. There is erythema.  Psychiatric: She has a normal mood and affect. Her behavior is normal.    Labs reviewed: Basic Metabolic Panel:  Recent Labs  07/09/16 1120 07/10/16 0412 07/21/16 0823  NA 140 138 139  K 4.4 3.6 4.0  CL 103 106 105  CO2 26 24 26   GLUCOSE 111* 111* 129*  BUN 9 7 10   CREATININE 0.73 0.63 0.69  CALCIUM 9.6 8.7* 9.5   Liver Function Tests:  Recent Labs  07/21/16 0823  AST 19  ALT 15  ALKPHOS 60  BILITOT 0.4  PROT 7.2  ALBUMIN 4.1   No results for input(s): LIPASE, AMYLASE in the last 8760 hours. No results for input(s): AMMONIA in the last 8760 hours. CBC:  Recent Labs  01/01/16 1206 07/09/16 1120 07/10/16 0412 07/21/16 0823  WBC 5.0 6.0 4.9 4.1  NEUTROABS 2,950 4.3  --  2,296  HGB 13.6 13.5 13.1 13.8  HCT 40.0 41.7 40.5 41.2  MCV 93.7 96.8 97.6 94.5  PLT 216 185 168 215   Cardiac Enzymes: No results for input(s): CKTOTAL, CKMB, CKMBINDEX, TROPONINI in the last 8760 hours. BNP: Invalid input(s): POCBNP Lab Results  Component Value Date   HGBA1C 6.0 (H) 03/19/2015   Lab Results  Component Value Date   TSH 0.982 06/10/2015   Assessment/Plan 1. Annual physical exam -done today  2. Visual loss, bilateral - unclear  if this is simply due to cataracts and need for a prescription for reading glasses or if the halllucinations are  actually a visual problem--needs up to date eye exam  - Ambulatory referral to Ophthalmology - Ambulatory referral to Home Health--has had problems with expired food in her home, difficulty taking trash out and will need CNA, but needs updated home safety eval first to determine how much help she truly needs  3. Mild cognitive impairment with memory loss - ongoing - now with possible visual hallucinations (vs visual deficit issue) MMSE - Mini Mental State Exam 07/21/2016 07/03/2014  Orientation to time 5 5  Orientation to Place 4 5  Registration 3 3  Attention/ Calculation 5 5  Recall 3 3  Language- name 2 objects 2 2  Language- repeat 1 1  Language- follow 3 step command 3 3  Language- read & follow direction 1 1  Write a sentence 1 1  Copy design 1 0  Total score 29 29  - Ambulatory referral to [facility], RN, OT and will need CNA at least a couple of times per week to check on her b/c her granddaughter is out of town more than usual  4. Visual hallucinations - possibly vs problem with sight (glitter, paint chips and bug seen) - Ambulatory referral to [facility]  5. Venous insufficiency - is to elevate feet at rest and should wear compression hose  But cannot put them on herself in her home environment - EKG 12-Lead - Ambulatory referral to [facility]  6. OAB (overactive bladder) - Ambulatory referral to [facility] - doing better with myrbetriq 25mg  daily vs. Nothing for her frequency  7. Essential hypertension, benign - bp at goal with current therapy so cont to monitor--she does not tolerate adjustments to these meds well - EKG 12-Lead - Ambulatory referral to [facility]  8. Need for vaccination with 13-polyvalent pneumococcal conjugate vaccine - Pneumococcal conjugate vaccine 13-valent  Labs/tests ordered:   Orders Placed This Encounter  Procedures   . Pneumococcal conjugate vaccine 13-valent  . Ambulatory referral to Ophthalmology    Referral Priority:   Routine    Referral Type:   Consultation    Referral Reason:   Specialty Services Required    Requested Specialty:   Ophthalmology    Number of Visits Requested:   1  . Ambulatory referral to Home Health    Referral Priority:   Routine    Referral Type:   Home Health Care    Referral Reason:   Specialty Services Required    Requested Specialty:   [place]    Number of Visits Requested:   1  . EKG 12-Lead    Order Specific Question:   Where should this test be performed    Answer:   OTHER   Talayah Picardi L. Kailin Principato, D.O. [facility] [address], [place] [address] Cell Phone (Mon-Fri 8am-5pm):  [phone] On Call:  [phone] & follow prompts after 5pm & weekends Office Phone:  [phone] Office Fax:  [phone]

## 2016-07-30 DIAGNOSIS — I872 Venous insufficiency (chronic) (peripheral): Secondary | ICD-10-CM | POA: Diagnosis not present

## 2016-07-30 DIAGNOSIS — M79604 Pain in right leg: Secondary | ICD-10-CM | POA: Diagnosis not present

## 2016-07-30 DIAGNOSIS — H543 Unqualified visual loss, both eyes: Secondary | ICD-10-CM | POA: Diagnosis not present

## 2016-07-30 DIAGNOSIS — Z853 Personal history of malignant neoplasm of breast: Secondary | ICD-10-CM | POA: Diagnosis not present

## 2016-07-30 DIAGNOSIS — I10 Essential (primary) hypertension: Secondary | ICD-10-CM | POA: Diagnosis not present

## 2016-07-30 DIAGNOSIS — R5383 Other fatigue: Secondary | ICD-10-CM | POA: Diagnosis not present

## 2016-07-30 DIAGNOSIS — G3184 Mild cognitive impairment, so stated: Secondary | ICD-10-CM | POA: Diagnosis not present

## 2016-07-30 DIAGNOSIS — H269 Unspecified cataract: Secondary | ICD-10-CM | POA: Diagnosis not present

## 2016-07-30 DIAGNOSIS — R441 Visual hallucinations: Secondary | ICD-10-CM | POA: Diagnosis not present

## 2016-07-30 DIAGNOSIS — R0609 Other forms of dyspnea: Secondary | ICD-10-CM | POA: Diagnosis not present

## 2016-07-30 DIAGNOSIS — I6990 Unspecified sequelae of unspecified cerebrovascular disease: Secondary | ICD-10-CM | POA: Diagnosis not present

## 2016-08-03 DIAGNOSIS — R5383 Other fatigue: Secondary | ICD-10-CM | POA: Diagnosis not present

## 2016-08-03 DIAGNOSIS — G3184 Mild cognitive impairment, so stated: Secondary | ICD-10-CM | POA: Diagnosis not present

## 2016-08-03 DIAGNOSIS — I872 Venous insufficiency (chronic) (peripheral): Secondary | ICD-10-CM | POA: Diagnosis not present

## 2016-08-03 DIAGNOSIS — H543 Unqualified visual loss, both eyes: Secondary | ICD-10-CM | POA: Diagnosis not present

## 2016-08-03 DIAGNOSIS — H269 Unspecified cataract: Secondary | ICD-10-CM | POA: Diagnosis not present

## 2016-08-03 DIAGNOSIS — Z853 Personal history of malignant neoplasm of breast: Secondary | ICD-10-CM | POA: Diagnosis not present

## 2016-08-03 DIAGNOSIS — I6990 Unspecified sequelae of unspecified cerebrovascular disease: Secondary | ICD-10-CM | POA: Diagnosis not present

## 2016-08-03 DIAGNOSIS — I10 Essential (primary) hypertension: Secondary | ICD-10-CM | POA: Diagnosis not present

## 2016-08-03 DIAGNOSIS — R0609 Other forms of dyspnea: Secondary | ICD-10-CM | POA: Diagnosis not present

## 2016-08-03 DIAGNOSIS — M79604 Pain in right leg: Secondary | ICD-10-CM | POA: Diagnosis not present

## 2016-08-04 ENCOUNTER — Telehealth: Payer: Self-pay

## 2016-08-04 NOTE — Telephone Encounter (Signed)
Message left on clinical intake voicemail:    Physical Therapist with [facility] called requesting verbal orders for Home Safety and Balance.  Per [employer] standing order, verbal order given, left message on voicemail for Adriene . Message will be sent to patient's provider as a FYI.

## 2016-08-05 DIAGNOSIS — M79604 Pain in right leg: Secondary | ICD-10-CM | POA: Diagnosis not present

## 2016-08-05 DIAGNOSIS — H543 Unqualified visual loss, both eyes: Secondary | ICD-10-CM | POA: Diagnosis not present

## 2016-08-05 DIAGNOSIS — H269 Unspecified cataract: Secondary | ICD-10-CM | POA: Diagnosis not present

## 2016-08-05 DIAGNOSIS — I10 Essential (primary) hypertension: Secondary | ICD-10-CM | POA: Diagnosis not present

## 2016-08-05 DIAGNOSIS — I872 Venous insufficiency (chronic) (peripheral): Secondary | ICD-10-CM | POA: Diagnosis not present

## 2016-08-05 DIAGNOSIS — R5383 Other fatigue: Secondary | ICD-10-CM | POA: Diagnosis not present

## 2016-08-05 DIAGNOSIS — Z853 Personal history of malignant neoplasm of breast: Secondary | ICD-10-CM | POA: Diagnosis not present

## 2016-08-05 DIAGNOSIS — I6990 Unspecified sequelae of unspecified cerebrovascular disease: Secondary | ICD-10-CM | POA: Diagnosis not present

## 2016-08-05 DIAGNOSIS — G3184 Mild cognitive impairment, so stated: Secondary | ICD-10-CM | POA: Diagnosis not present

## 2016-08-05 DIAGNOSIS — R0609 Other forms of dyspnea: Secondary | ICD-10-CM | POA: Diagnosis not present

## 2016-08-10 DIAGNOSIS — Z853 Personal history of malignant neoplasm of breast: Secondary | ICD-10-CM | POA: Diagnosis not present

## 2016-08-10 DIAGNOSIS — H269 Unspecified cataract: Secondary | ICD-10-CM | POA: Diagnosis not present

## 2016-08-10 DIAGNOSIS — H543 Unqualified visual loss, both eyes: Secondary | ICD-10-CM | POA: Diagnosis not present

## 2016-08-10 DIAGNOSIS — I6990 Unspecified sequelae of unspecified cerebrovascular disease: Secondary | ICD-10-CM | POA: Diagnosis not present

## 2016-08-10 DIAGNOSIS — I872 Venous insufficiency (chronic) (peripheral): Secondary | ICD-10-CM | POA: Diagnosis not present

## 2016-08-10 DIAGNOSIS — G3184 Mild cognitive impairment, so stated: Secondary | ICD-10-CM | POA: Diagnosis not present

## 2016-08-10 DIAGNOSIS — R0609 Other forms of dyspnea: Secondary | ICD-10-CM | POA: Diagnosis not present

## 2016-08-10 DIAGNOSIS — I10 Essential (primary) hypertension: Secondary | ICD-10-CM | POA: Diagnosis not present

## 2016-08-10 DIAGNOSIS — R5383 Other fatigue: Secondary | ICD-10-CM | POA: Diagnosis not present

## 2016-08-10 DIAGNOSIS — M79604 Pain in right leg: Secondary | ICD-10-CM | POA: Diagnosis not present

## 2016-08-11 ENCOUNTER — Encounter: Payer: Self-pay | Admitting: Internal Medicine

## 2016-08-12 DIAGNOSIS — H543 Unqualified visual loss, both eyes: Secondary | ICD-10-CM | POA: Diagnosis not present

## 2016-08-12 DIAGNOSIS — I6990 Unspecified sequelae of unspecified cerebrovascular disease: Secondary | ICD-10-CM | POA: Diagnosis not present

## 2016-08-12 DIAGNOSIS — M79604 Pain in right leg: Secondary | ICD-10-CM | POA: Diagnosis not present

## 2016-08-12 DIAGNOSIS — I872 Venous insufficiency (chronic) (peripheral): Secondary | ICD-10-CM | POA: Diagnosis not present

## 2016-08-12 DIAGNOSIS — Z853 Personal history of malignant neoplasm of breast: Secondary | ICD-10-CM | POA: Diagnosis not present

## 2016-08-12 DIAGNOSIS — G3184 Mild cognitive impairment, so stated: Secondary | ICD-10-CM | POA: Diagnosis not present

## 2016-08-12 DIAGNOSIS — H269 Unspecified cataract: Secondary | ICD-10-CM | POA: Diagnosis not present

## 2016-08-12 DIAGNOSIS — I10 Essential (primary) hypertension: Secondary | ICD-10-CM | POA: Diagnosis not present

## 2016-08-12 DIAGNOSIS — R0609 Other forms of dyspnea: Secondary | ICD-10-CM | POA: Diagnosis not present

## 2016-08-12 DIAGNOSIS — R5383 Other fatigue: Secondary | ICD-10-CM | POA: Diagnosis not present

## 2016-08-17 DIAGNOSIS — H269 Unspecified cataract: Secondary | ICD-10-CM | POA: Diagnosis not present

## 2016-08-17 DIAGNOSIS — R0609 Other forms of dyspnea: Secondary | ICD-10-CM | POA: Diagnosis not present

## 2016-08-17 DIAGNOSIS — I6990 Unspecified sequelae of unspecified cerebrovascular disease: Secondary | ICD-10-CM | POA: Diagnosis not present

## 2016-08-17 DIAGNOSIS — G3184 Mild cognitive impairment, so stated: Secondary | ICD-10-CM | POA: Diagnosis not present

## 2016-08-17 DIAGNOSIS — I10 Essential (primary) hypertension: Secondary | ICD-10-CM | POA: Diagnosis not present

## 2016-08-17 DIAGNOSIS — R5383 Other fatigue: Secondary | ICD-10-CM | POA: Diagnosis not present

## 2016-08-17 DIAGNOSIS — I872 Venous insufficiency (chronic) (peripheral): Secondary | ICD-10-CM | POA: Diagnosis not present

## 2016-08-17 DIAGNOSIS — M79604 Pain in right leg: Secondary | ICD-10-CM | POA: Diagnosis not present

## 2016-08-17 DIAGNOSIS — Z853 Personal history of malignant neoplasm of breast: Secondary | ICD-10-CM | POA: Diagnosis not present

## 2016-08-17 DIAGNOSIS — H543 Unqualified visual loss, both eyes: Secondary | ICD-10-CM | POA: Diagnosis not present

## 2016-08-19 DIAGNOSIS — H269 Unspecified cataract: Secondary | ICD-10-CM | POA: Diagnosis not present

## 2016-08-19 DIAGNOSIS — I6990 Unspecified sequelae of unspecified cerebrovascular disease: Secondary | ICD-10-CM | POA: Diagnosis not present

## 2016-08-19 DIAGNOSIS — H543 Unqualified visual loss, both eyes: Secondary | ICD-10-CM | POA: Diagnosis not present

## 2016-08-19 DIAGNOSIS — R0609 Other forms of dyspnea: Secondary | ICD-10-CM | POA: Diagnosis not present

## 2016-08-19 DIAGNOSIS — R5383 Other fatigue: Secondary | ICD-10-CM | POA: Diagnosis not present

## 2016-08-19 DIAGNOSIS — I10 Essential (primary) hypertension: Secondary | ICD-10-CM | POA: Diagnosis not present

## 2016-08-19 DIAGNOSIS — I872 Venous insufficiency (chronic) (peripheral): Secondary | ICD-10-CM | POA: Diagnosis not present

## 2016-08-19 DIAGNOSIS — Z853 Personal history of malignant neoplasm of breast: Secondary | ICD-10-CM | POA: Diagnosis not present

## 2016-08-19 DIAGNOSIS — M79604 Pain in right leg: Secondary | ICD-10-CM | POA: Diagnosis not present

## 2016-08-19 DIAGNOSIS — G3184 Mild cognitive impairment, so stated: Secondary | ICD-10-CM | POA: Diagnosis not present

## 2016-08-23 ENCOUNTER — Telehealth: Payer: Self-pay | Admitting: *Deleted

## 2016-08-23 NOTE — Telephone Encounter (Signed)
Beth Foster, granddaughter called and left message on Clinical intake and stated that patient was started on Myrebetriq. Stated that it is working well. Stated that patient has developed a UTI and this is one of the side effects from the medication.   I called granddaughter back and LMOM to return call with patient's symptoms and if there were any fever.

## 2016-08-23 NOTE — Telephone Encounter (Signed)
Doubt she actually has UTI without other symptoms like belly pain or fever.  She needs to drink more water so her urine is not strong and painful.  Perhaps an alarm can be set to remind her OR she can use a pitcher or water bottle with measurements on it so she gets ideally 64 oz in a day. 48 oz would be a great start.

## 2016-08-23 NOTE — Telephone Encounter (Signed)
Beth Foster called back and stated that she has burning with urination, no fever. The medication, Myrbetriq is helping. Patient also informed granddaughter that she had not had a BM in 3-4 days but did have one this morning. Granddaughter feels like she does not drink a lot of water. Just concerned with the burning after reading that UTI is a side effect of Myrbetriq. Please Advise.

## 2016-08-23 NOTE — Telephone Encounter (Signed)
Vicente Males notified and agreed.

## 2016-08-24 DIAGNOSIS — M79604 Pain in right leg: Secondary | ICD-10-CM | POA: Diagnosis not present

## 2016-08-24 DIAGNOSIS — H269 Unspecified cataract: Secondary | ICD-10-CM | POA: Diagnosis not present

## 2016-08-24 DIAGNOSIS — G3184 Mild cognitive impairment, so stated: Secondary | ICD-10-CM | POA: Diagnosis not present

## 2016-08-24 DIAGNOSIS — I10 Essential (primary) hypertension: Secondary | ICD-10-CM | POA: Diagnosis not present

## 2016-08-24 DIAGNOSIS — Z853 Personal history of malignant neoplasm of breast: Secondary | ICD-10-CM | POA: Diagnosis not present

## 2016-08-24 DIAGNOSIS — I872 Venous insufficiency (chronic) (peripheral): Secondary | ICD-10-CM | POA: Diagnosis not present

## 2016-08-24 DIAGNOSIS — R5383 Other fatigue: Secondary | ICD-10-CM | POA: Diagnosis not present

## 2016-08-24 DIAGNOSIS — I6990 Unspecified sequelae of unspecified cerebrovascular disease: Secondary | ICD-10-CM | POA: Diagnosis not present

## 2016-08-24 DIAGNOSIS — R0609 Other forms of dyspnea: Secondary | ICD-10-CM | POA: Diagnosis not present

## 2016-08-24 DIAGNOSIS — H543 Unqualified visual loss, both eyes: Secondary | ICD-10-CM | POA: Diagnosis not present

## 2016-08-26 DIAGNOSIS — H543 Unqualified visual loss, both eyes: Secondary | ICD-10-CM | POA: Diagnosis not present

## 2016-08-26 DIAGNOSIS — I10 Essential (primary) hypertension: Secondary | ICD-10-CM | POA: Diagnosis not present

## 2016-08-26 DIAGNOSIS — M79604 Pain in right leg: Secondary | ICD-10-CM | POA: Diagnosis not present

## 2016-08-26 DIAGNOSIS — H269 Unspecified cataract: Secondary | ICD-10-CM | POA: Diagnosis not present

## 2016-08-26 DIAGNOSIS — R0609 Other forms of dyspnea: Secondary | ICD-10-CM | POA: Diagnosis not present

## 2016-08-26 DIAGNOSIS — R5383 Other fatigue: Secondary | ICD-10-CM | POA: Diagnosis not present

## 2016-08-26 DIAGNOSIS — G3184 Mild cognitive impairment, so stated: Secondary | ICD-10-CM | POA: Diagnosis not present

## 2016-08-26 DIAGNOSIS — I872 Venous insufficiency (chronic) (peripheral): Secondary | ICD-10-CM | POA: Diagnosis not present

## 2016-08-26 DIAGNOSIS — Z853 Personal history of malignant neoplasm of breast: Secondary | ICD-10-CM | POA: Diagnosis not present

## 2016-08-26 DIAGNOSIS — I6990 Unspecified sequelae of unspecified cerebrovascular disease: Secondary | ICD-10-CM | POA: Diagnosis not present

## 2016-08-31 DIAGNOSIS — I872 Venous insufficiency (chronic) (peripheral): Secondary | ICD-10-CM | POA: Diagnosis not present

## 2016-08-31 DIAGNOSIS — H543 Unqualified visual loss, both eyes: Secondary | ICD-10-CM | POA: Diagnosis not present

## 2016-08-31 DIAGNOSIS — Z853 Personal history of malignant neoplasm of breast: Secondary | ICD-10-CM | POA: Diagnosis not present

## 2016-08-31 DIAGNOSIS — G3184 Mild cognitive impairment, so stated: Secondary | ICD-10-CM | POA: Diagnosis not present

## 2016-08-31 DIAGNOSIS — H269 Unspecified cataract: Secondary | ICD-10-CM | POA: Diagnosis not present

## 2016-08-31 DIAGNOSIS — R5383 Other fatigue: Secondary | ICD-10-CM | POA: Diagnosis not present

## 2016-08-31 DIAGNOSIS — I10 Essential (primary) hypertension: Secondary | ICD-10-CM | POA: Diagnosis not present

## 2016-08-31 DIAGNOSIS — I6990 Unspecified sequelae of unspecified cerebrovascular disease: Secondary | ICD-10-CM | POA: Diagnosis not present

## 2016-08-31 DIAGNOSIS — R0609 Other forms of dyspnea: Secondary | ICD-10-CM | POA: Diagnosis not present

## 2016-08-31 DIAGNOSIS — M79604 Pain in right leg: Secondary | ICD-10-CM | POA: Diagnosis not present

## 2016-09-02 DIAGNOSIS — G3184 Mild cognitive impairment, so stated: Secondary | ICD-10-CM | POA: Diagnosis not present

## 2016-09-02 DIAGNOSIS — I872 Venous insufficiency (chronic) (peripheral): Secondary | ICD-10-CM | POA: Diagnosis not present

## 2016-09-02 DIAGNOSIS — I10 Essential (primary) hypertension: Secondary | ICD-10-CM | POA: Diagnosis not present

## 2016-09-02 DIAGNOSIS — Z853 Personal history of malignant neoplasm of breast: Secondary | ICD-10-CM | POA: Diagnosis not present

## 2016-09-02 DIAGNOSIS — H543 Unqualified visual loss, both eyes: Secondary | ICD-10-CM | POA: Diagnosis not present

## 2016-09-02 DIAGNOSIS — R5383 Other fatigue: Secondary | ICD-10-CM | POA: Diagnosis not present

## 2016-09-02 DIAGNOSIS — R0609 Other forms of dyspnea: Secondary | ICD-10-CM | POA: Diagnosis not present

## 2016-09-02 DIAGNOSIS — H269 Unspecified cataract: Secondary | ICD-10-CM | POA: Diagnosis not present

## 2016-09-02 DIAGNOSIS — I6990 Unspecified sequelae of unspecified cerebrovascular disease: Secondary | ICD-10-CM | POA: Diagnosis not present

## 2016-09-02 DIAGNOSIS — M79604 Pain in right leg: Secondary | ICD-10-CM | POA: Diagnosis not present

## 2016-09-07 DIAGNOSIS — G3184 Mild cognitive impairment, so stated: Secondary | ICD-10-CM | POA: Diagnosis not present

## 2016-09-07 DIAGNOSIS — Z853 Personal history of malignant neoplasm of breast: Secondary | ICD-10-CM | POA: Diagnosis not present

## 2016-09-07 DIAGNOSIS — M79604 Pain in right leg: Secondary | ICD-10-CM | POA: Diagnosis not present

## 2016-09-07 DIAGNOSIS — I10 Essential (primary) hypertension: Secondary | ICD-10-CM | POA: Diagnosis not present

## 2016-09-07 DIAGNOSIS — H543 Unqualified visual loss, both eyes: Secondary | ICD-10-CM | POA: Diagnosis not present

## 2016-09-07 DIAGNOSIS — I6990 Unspecified sequelae of unspecified cerebrovascular disease: Secondary | ICD-10-CM | POA: Diagnosis not present

## 2016-09-07 DIAGNOSIS — H269 Unspecified cataract: Secondary | ICD-10-CM | POA: Diagnosis not present

## 2016-09-07 DIAGNOSIS — R0609 Other forms of dyspnea: Secondary | ICD-10-CM | POA: Diagnosis not present

## 2016-09-07 DIAGNOSIS — I872 Venous insufficiency (chronic) (peripheral): Secondary | ICD-10-CM | POA: Diagnosis not present

## 2016-09-07 DIAGNOSIS — R5383 Other fatigue: Secondary | ICD-10-CM | POA: Diagnosis not present

## 2016-09-09 DIAGNOSIS — Z853 Personal history of malignant neoplasm of breast: Secondary | ICD-10-CM | POA: Diagnosis not present

## 2016-09-09 DIAGNOSIS — G3184 Mild cognitive impairment, so stated: Secondary | ICD-10-CM | POA: Diagnosis not present

## 2016-09-09 DIAGNOSIS — H269 Unspecified cataract: Secondary | ICD-10-CM | POA: Diagnosis not present

## 2016-09-09 DIAGNOSIS — H543 Unqualified visual loss, both eyes: Secondary | ICD-10-CM | POA: Diagnosis not present

## 2016-09-09 DIAGNOSIS — I6990 Unspecified sequelae of unspecified cerebrovascular disease: Secondary | ICD-10-CM | POA: Diagnosis not present

## 2016-09-09 DIAGNOSIS — I10 Essential (primary) hypertension: Secondary | ICD-10-CM | POA: Diagnosis not present

## 2016-09-09 DIAGNOSIS — I872 Venous insufficiency (chronic) (peripheral): Secondary | ICD-10-CM | POA: Diagnosis not present

## 2016-09-09 DIAGNOSIS — R5383 Other fatigue: Secondary | ICD-10-CM | POA: Diagnosis not present

## 2016-09-09 DIAGNOSIS — R0609 Other forms of dyspnea: Secondary | ICD-10-CM | POA: Diagnosis not present

## 2016-09-09 DIAGNOSIS — M79604 Pain in right leg: Secondary | ICD-10-CM | POA: Diagnosis not present

## 2016-09-14 DIAGNOSIS — G3184 Mild cognitive impairment, so stated: Secondary | ICD-10-CM | POA: Diagnosis not present

## 2016-09-14 DIAGNOSIS — Z853 Personal history of malignant neoplasm of breast: Secondary | ICD-10-CM | POA: Diagnosis not present

## 2016-09-14 DIAGNOSIS — R0609 Other forms of dyspnea: Secondary | ICD-10-CM | POA: Diagnosis not present

## 2016-09-14 DIAGNOSIS — H543 Unqualified visual loss, both eyes: Secondary | ICD-10-CM | POA: Diagnosis not present

## 2016-09-14 DIAGNOSIS — I872 Venous insufficiency (chronic) (peripheral): Secondary | ICD-10-CM | POA: Diagnosis not present

## 2016-09-14 DIAGNOSIS — R5383 Other fatigue: Secondary | ICD-10-CM | POA: Diagnosis not present

## 2016-09-14 DIAGNOSIS — M79604 Pain in right leg: Secondary | ICD-10-CM | POA: Diagnosis not present

## 2016-09-14 DIAGNOSIS — I6990 Unspecified sequelae of unspecified cerebrovascular disease: Secondary | ICD-10-CM | POA: Diagnosis not present

## 2016-09-14 DIAGNOSIS — H269 Unspecified cataract: Secondary | ICD-10-CM | POA: Diagnosis not present

## 2016-09-14 DIAGNOSIS — I10 Essential (primary) hypertension: Secondary | ICD-10-CM | POA: Diagnosis not present

## 2016-09-15 DIAGNOSIS — R0609 Other forms of dyspnea: Secondary | ICD-10-CM | POA: Diagnosis not present

## 2016-09-15 DIAGNOSIS — I6990 Unspecified sequelae of unspecified cerebrovascular disease: Secondary | ICD-10-CM | POA: Diagnosis not present

## 2016-09-15 DIAGNOSIS — H269 Unspecified cataract: Secondary | ICD-10-CM | POA: Diagnosis not present

## 2016-09-15 DIAGNOSIS — I10 Essential (primary) hypertension: Secondary | ICD-10-CM | POA: Diagnosis not present

## 2016-09-15 DIAGNOSIS — Z853 Personal history of malignant neoplasm of breast: Secondary | ICD-10-CM | POA: Diagnosis not present

## 2016-09-15 DIAGNOSIS — G3184 Mild cognitive impairment, so stated: Secondary | ICD-10-CM | POA: Diagnosis not present

## 2016-09-15 DIAGNOSIS — H543 Unqualified visual loss, both eyes: Secondary | ICD-10-CM | POA: Diagnosis not present

## 2016-09-15 DIAGNOSIS — I872 Venous insufficiency (chronic) (peripheral): Secondary | ICD-10-CM | POA: Diagnosis not present

## 2016-09-15 DIAGNOSIS — R5383 Other fatigue: Secondary | ICD-10-CM | POA: Diagnosis not present

## 2016-09-15 DIAGNOSIS — M79604 Pain in right leg: Secondary | ICD-10-CM | POA: Diagnosis not present

## 2016-09-20 ENCOUNTER — Encounter: Payer: Self-pay | Admitting: Podiatry

## 2016-09-20 ENCOUNTER — Ambulatory Visit (INDEPENDENT_AMBULATORY_CARE_PROVIDER_SITE_OTHER): Payer: Medicare Other | Admitting: Podiatry

## 2016-09-20 DIAGNOSIS — B351 Tinea unguium: Secondary | ICD-10-CM

## 2016-09-20 DIAGNOSIS — M79676 Pain in unspecified toe(s): Secondary | ICD-10-CM | POA: Diagnosis not present

## 2016-09-20 NOTE — Progress Notes (Signed)
She presents today with her grandson chief complaint of painful elongated toenails.  Objective: Toenails are long thick yellow dystrophic clinic mycotic painful palpation which are incurvated nail margins.  Assessment: Pain limb second and onychomycosis.  Plan: Debridement of toenails 1 through 5 bilateral. Follow up with her in 3 months

## 2016-09-21 DIAGNOSIS — H543 Unqualified visual loss, both eyes: Secondary | ICD-10-CM | POA: Diagnosis not present

## 2016-09-21 DIAGNOSIS — I10 Essential (primary) hypertension: Secondary | ICD-10-CM | POA: Diagnosis not present

## 2016-09-21 DIAGNOSIS — R0609 Other forms of dyspnea: Secondary | ICD-10-CM | POA: Diagnosis not present

## 2016-09-21 DIAGNOSIS — R5383 Other fatigue: Secondary | ICD-10-CM | POA: Diagnosis not present

## 2016-09-21 DIAGNOSIS — I6990 Unspecified sequelae of unspecified cerebrovascular disease: Secondary | ICD-10-CM | POA: Diagnosis not present

## 2016-09-21 DIAGNOSIS — G3184 Mild cognitive impairment, so stated: Secondary | ICD-10-CM | POA: Diagnosis not present

## 2016-09-21 DIAGNOSIS — Z853 Personal history of malignant neoplasm of breast: Secondary | ICD-10-CM | POA: Diagnosis not present

## 2016-09-21 DIAGNOSIS — H269 Unspecified cataract: Secondary | ICD-10-CM | POA: Diagnosis not present

## 2016-09-21 DIAGNOSIS — I872 Venous insufficiency (chronic) (peripheral): Secondary | ICD-10-CM | POA: Diagnosis not present

## 2016-09-21 DIAGNOSIS — M79604 Pain in right leg: Secondary | ICD-10-CM | POA: Diagnosis not present

## 2016-09-23 DIAGNOSIS — R0609 Other forms of dyspnea: Secondary | ICD-10-CM | POA: Diagnosis not present

## 2016-09-23 DIAGNOSIS — G3184 Mild cognitive impairment, so stated: Secondary | ICD-10-CM | POA: Diagnosis not present

## 2016-09-23 DIAGNOSIS — Z853 Personal history of malignant neoplasm of breast: Secondary | ICD-10-CM | POA: Diagnosis not present

## 2016-09-23 DIAGNOSIS — R5383 Other fatigue: Secondary | ICD-10-CM | POA: Diagnosis not present

## 2016-09-23 DIAGNOSIS — H543 Unqualified visual loss, both eyes: Secondary | ICD-10-CM | POA: Diagnosis not present

## 2016-09-23 DIAGNOSIS — M79604 Pain in right leg: Secondary | ICD-10-CM | POA: Diagnosis not present

## 2016-09-23 DIAGNOSIS — I6990 Unspecified sequelae of unspecified cerebrovascular disease: Secondary | ICD-10-CM | POA: Diagnosis not present

## 2016-09-23 DIAGNOSIS — H269 Unspecified cataract: Secondary | ICD-10-CM | POA: Diagnosis not present

## 2016-09-23 DIAGNOSIS — I10 Essential (primary) hypertension: Secondary | ICD-10-CM | POA: Diagnosis not present

## 2016-09-23 DIAGNOSIS — I872 Venous insufficiency (chronic) (peripheral): Secondary | ICD-10-CM | POA: Diagnosis not present

## 2016-09-27 DIAGNOSIS — Z853 Personal history of malignant neoplasm of breast: Secondary | ICD-10-CM | POA: Diagnosis not present

## 2016-09-27 DIAGNOSIS — I10 Essential (primary) hypertension: Secondary | ICD-10-CM | POA: Diagnosis not present

## 2016-09-27 DIAGNOSIS — R0609 Other forms of dyspnea: Secondary | ICD-10-CM | POA: Diagnosis not present

## 2016-09-27 DIAGNOSIS — I6990 Unspecified sequelae of unspecified cerebrovascular disease: Secondary | ICD-10-CM | POA: Diagnosis not present

## 2016-09-27 DIAGNOSIS — H269 Unspecified cataract: Secondary | ICD-10-CM | POA: Diagnosis not present

## 2016-09-27 DIAGNOSIS — I872 Venous insufficiency (chronic) (peripheral): Secondary | ICD-10-CM | POA: Diagnosis not present

## 2016-09-27 DIAGNOSIS — R5383 Other fatigue: Secondary | ICD-10-CM | POA: Diagnosis not present

## 2016-09-27 DIAGNOSIS — H543 Unqualified visual loss, both eyes: Secondary | ICD-10-CM | POA: Diagnosis not present

## 2016-09-27 DIAGNOSIS — M79604 Pain in right leg: Secondary | ICD-10-CM | POA: Diagnosis not present

## 2016-09-27 DIAGNOSIS — G3184 Mild cognitive impairment, so stated: Secondary | ICD-10-CM | POA: Diagnosis not present

## 2016-11-18 DIAGNOSIS — H5201 Hypermetropia, right eye: Secondary | ICD-10-CM | POA: Diagnosis not present

## 2016-11-18 DIAGNOSIS — H40013 Open angle with borderline findings, low risk, bilateral: Secondary | ICD-10-CM | POA: Diagnosis not present

## 2016-11-18 DIAGNOSIS — H353222 Exudative age-related macular degeneration, left eye, with inactive choroidal neovascularization: Secondary | ICD-10-CM | POA: Diagnosis not present

## 2016-11-18 DIAGNOSIS — H353112 Nonexudative age-related macular degeneration, right eye, intermediate dry stage: Secondary | ICD-10-CM | POA: Diagnosis not present

## 2016-11-22 DIAGNOSIS — H353221 Exudative age-related macular degeneration, left eye, with active choroidal neovascularization: Secondary | ICD-10-CM | POA: Diagnosis not present

## 2016-11-24 DIAGNOSIS — H353222 Exudative age-related macular degeneration, left eye, with inactive choroidal neovascularization: Secondary | ICD-10-CM | POA: Diagnosis not present

## 2016-12-09 ENCOUNTER — Other Ambulatory Visit: Payer: Self-pay | Admitting: Internal Medicine

## 2016-12-20 ENCOUNTER — Encounter: Payer: Self-pay | Admitting: Podiatry

## 2016-12-20 ENCOUNTER — Ambulatory Visit (INDEPENDENT_AMBULATORY_CARE_PROVIDER_SITE_OTHER): Payer: Medicare Other | Admitting: Podiatry

## 2016-12-20 DIAGNOSIS — M79676 Pain in unspecified toe(s): Secondary | ICD-10-CM

## 2016-12-20 DIAGNOSIS — B351 Tinea unguium: Secondary | ICD-10-CM

## 2016-12-21 NOTE — Progress Notes (Signed)
She presents today with chief concern of painful elongated toenails.  Objective: Vital signs are stable alert and oriented 3. Pulses are palpable. Nails are long thick L dystrophic with mycotic sharply incurvated long fibular border hallux right.  Assessment: Pain in limb secondary to ingrown toenails and onychomycosis.  Plan: Debridement and through 5 bilateral.

## 2017-01-09 ENCOUNTER — Other Ambulatory Visit: Payer: Self-pay | Admitting: Internal Medicine

## 2017-02-01 DIAGNOSIS — H353222 Exudative age-related macular degeneration, left eye, with inactive choroidal neovascularization: Secondary | ICD-10-CM | POA: Diagnosis not present

## 2017-02-01 DIAGNOSIS — H353111 Nonexudative age-related macular degeneration, right eye, early dry stage: Secondary | ICD-10-CM | POA: Diagnosis not present

## 2017-02-08 ENCOUNTER — Other Ambulatory Visit: Payer: Self-pay | Admitting: Internal Medicine

## 2017-03-10 ENCOUNTER — Other Ambulatory Visit: Payer: Self-pay | Admitting: Internal Medicine

## 2017-03-17 DIAGNOSIS — H40013 Open angle with borderline findings, low risk, bilateral: Secondary | ICD-10-CM | POA: Diagnosis not present

## 2017-03-21 ENCOUNTER — Encounter: Payer: Self-pay | Admitting: Podiatry

## 2017-03-21 ENCOUNTER — Ambulatory Visit (INDEPENDENT_AMBULATORY_CARE_PROVIDER_SITE_OTHER): Payer: Medicare Other | Admitting: Podiatry

## 2017-03-21 DIAGNOSIS — M79676 Pain in unspecified toe(s): Secondary | ICD-10-CM | POA: Diagnosis not present

## 2017-03-21 DIAGNOSIS — B351 Tinea unguium: Secondary | ICD-10-CM | POA: Diagnosis not present

## 2017-03-22 NOTE — Progress Notes (Signed)
She presents today with chief complaint of painful elongated toenails.  Objective: Vital signs are stable she is alert and oriented 3. Pulses are palpable. Her toenails are long and thick yellow dystrophic clinically mycotic and painful on palpation. They're also painful on debridement.  Assessment: Pain in limb secondary to onychomycosis.  Plan: Debridement of painful toenails 1 through 5 bilateral.

## 2017-04-11 ENCOUNTER — Other Ambulatory Visit: Payer: Self-pay | Admitting: Internal Medicine

## 2017-04-11 NOTE — Telephone Encounter (Signed)
Spoke with Beth Foster to inform her patient is over due for an appointment. Beth Foster states she is not sick and did not understand why she needed to be seen.  I informed Beth Foster per Dr.Reed she was to follow-up in June 2018 for 3 month med management. Beth Foster was was about to get on a plane and stated she would call back once she consulted with [name].   30 day supply of medication to be given

## 2017-04-18 ENCOUNTER — Other Ambulatory Visit: Payer: Self-pay | Admitting: *Deleted

## 2017-04-18 MED ORDER — DILTIAZEM HCL ER 240 MG PO CP24: 240.0000 mg | ORAL_CAPSULE | Freq: Every day | ORAL | 0 refills | Status: DC

## 2017-04-18 NOTE — Telephone Encounter (Signed)
POA Vicente Males called and stated that the pharmacy did not received refill request on 11/20 and requesting to refax. No medication for today. Refaxed Rx. Informed POA that patient needs an appointment. Stated that she was traveling and when she gets back into town she will schedule an appointment to bring patient in.

## 2017-05-16 ENCOUNTER — Other Ambulatory Visit: Payer: Self-pay | Admitting: Internal Medicine

## 2017-05-19 ENCOUNTER — Encounter: Payer: Self-pay | Admitting: Nurse Practitioner

## 2017-05-19 ENCOUNTER — Ambulatory Visit: Payer: Medicare Other | Admitting: Nurse Practitioner

## 2017-05-19 VITALS — BP 142/82 | HR 63 | Temp 98.3°F | Resp 17 | Ht 65.0 in | Wt 151.0 lb

## 2017-05-19 DIAGNOSIS — G3184 Mild cognitive impairment, so stated: Secondary | ICD-10-CM

## 2017-05-19 DIAGNOSIS — R739 Hyperglycemia, unspecified: Secondary | ICD-10-CM | POA: Diagnosis not present

## 2017-05-19 DIAGNOSIS — N3281 Overactive bladder: Secondary | ICD-10-CM | POA: Diagnosis not present

## 2017-05-19 DIAGNOSIS — E2839 Other primary ovarian failure: Secondary | ICD-10-CM

## 2017-05-19 DIAGNOSIS — R51 Headache: Secondary | ICD-10-CM | POA: Diagnosis not present

## 2017-05-19 DIAGNOSIS — I10 Essential (primary) hypertension: Secondary | ICD-10-CM | POA: Diagnosis not present

## 2017-05-19 DIAGNOSIS — I872 Venous insufficiency (chronic) (peripheral): Secondary | ICD-10-CM | POA: Diagnosis not present

## 2017-05-19 DIAGNOSIS — R519 Headache, unspecified: Secondary | ICD-10-CM

## 2017-05-19 MED ORDER — DILTIAZEM HCL ER 240 MG PO CP24: 240.0000 mg | ORAL_CAPSULE | Freq: Every day | ORAL | 1 refills | Status: DC

## 2017-05-19 MED ORDER — MIRABEGRON ER 25 MG PO TB24: 25.0000 mg | ORAL_TABLET | Freq: Every day | ORAL | 1 refills | Status: DC

## 2017-05-19 NOTE — Progress Notes (Signed)
Careteam: Patient Care Team: Gayland Curry, DO as PCP - General (Geriatric Medicine)  Advanced Directive information Does Patient Have a Medical Advance Directive?: Yes, Type of Advance Directive: Healthcare Power of Attorney  Allergies  Allergen Reactions  . Amoxicillin Anaphylaxis and Other (See Comments)    Has patient had a PCN reaction causing immediate rash, facial/tongue/throat swelling, SOB or lightheadedness with hypotension: Yes Has patient had a PCN reaction causing severe rash involving mucus membranes or skin necrosis: No Has patient had a PCN reaction that required hospitalization No Has patient had a PCN reaction occurring within the last 10 years: No If all of the above answers are "NO", then may proceed with Cephalosporin use.  . Angiotensin Receptor Blockers Anaphylaxis, Itching and Rash  . Beta Adrenergic Blockers Anaphylaxis, Itching and Rash  . Cephalexin Anaphylaxis  . Clindamycin/Lincomycin Anaphylaxis, Itching and Rash  . Eggs Or Egg-Derived Products Other (See Comments)    Reaction:  Blisters in mouth   . Tape Other (See Comments)    Reaction:  Pulls skin off   . Corticosteroids Itching and Rash  . Hydrocortisone Itching and Rash  . Latex Itching and Rash  . Other Itching, Rash and Other (See Comments)    Pt states that she has a pine allergy and she is only able to use fragrance free soaps and laundry products.    . Reglan [Metoclopramide] Itching and Rash    Chief Complaint  Patient presents with  . Medical Management of Chronic Issues    Pt is being seen for a routine visit and medication management. Pt has had right side facial pain twice in last 2 weeks. Pt only notices pain when she lays down to sleep and pain goes away once pt sits up in bed.   . Other    Granddaughter (POA) in room  . Medication Refill    Refills pended     HPI: Patient is a 83 y.o. female seen in the office today for routine follow up.  Pt with hx of OAB, htn, mild  memory loss, venous insufficiency.  Did not follow up in June with Dr Mariea Clonts therefore she is here today.   OAB- was on myrbetriq but ran out granddaughter reports she is bothered by this so surprised she did not get refill.   HTN-currently on diltiazem - does not tolerate adjustment well per notes  Venous insufficiency- using lasix PRN-rarely uses, maybe about a year?  Memory- lives along which granddaughter shakes her head when asked how it is going. States her eye sight is going. Does not eat good. Short term memory getting worse per granddaughter   In the last 2 weeks she has had a pain in her face on the right. Makes her nervous and would not go away until she sat up and then it would go away. Lasting a few mins. Episodes a week apart. None in the last week. Worried about strokes, has hx of this.  No facial droop, no slurred speech, no weakness, no changes in vision.  Does not drink enough water per granddaughter. Drinking 3-4 cups a coffee a day - has tried to cut.   Really wobbly with walking. granddaughter would prefer her to use walker but she will not use- only uses cane. Home bound at this time.   Review of Systems:  Review of Systems  Constitutional: Negative for chills and fever.  HENT: Negative for congestion and hearing loss.   Eyes: Positive for blurred vision. Negative  for double vision, photophobia, pain, discharge and redness.  Respiratory: Negative for cough and shortness of breath.   Cardiovascular: Negative for chest pain, palpitations and leg swelling.  Genitourinary: Positive for frequency and urgency. Negative for dysuria.       Improved frequency  Musculoskeletal: Negative for falls.       Uses cane  Skin: Negative for itching and rash.  Neurological: Negative for dizziness and headaches.  Psychiatric/Behavioral: Positive for memory loss (mild).    Past Medical History:  Diagnosis Date  . Anxiety state, unspecified   . Bunion   . Contact dermatitis and  other eczema due to other chemical products   . Edema   . External hemorrhoids without mention of complication   . GERD (gastroesophageal reflux disease)   . Hypertension   . Malignant neoplasm of breast (female), unspecified site dx'd 1996   rt breast; xrt   . Pain in joint, pelvic region and thigh   . Rash and other nonspecific skin eruption   . Stroke ([place])   . Unspecified cataract   . Unspecified late effects of cerebrovascular disease   . Viral hepatitis A without mention of hepatic coma   . Viral hepatitis A without mention of hepatic coma   . Vitamin D deficiency    Past Surgical History:  Procedure Laterality Date  . BREAST LUMPECTOMY  1995  . CARDIAC CATHETERIZATION  2004  . CHOLECYSTECTOMY  1972   Social History:   reports that  has never smoked. she has never used smokeless tobacco. She reports that she does not drink alcohol or use drugs.  Family History  Problem Relation Age of Onset  . Cancer Mother        lung  . Cancer Brother        brain  . Varicose Veins Father   . Varicose Veins Sister     Medications:   Medication List        Accurate as of 05/19/17 10:07 AM. Always use your most recent med list.          acetaminophen 325 MG tablet Commonly known as:  TYLENOL   cholecalciferol 1000 units tablet Commonly known as:  VITAMIN D   diltiazem 240 MG 24 hr capsule Commonly known as:  DILACOR XR Take 1 capsule (240 mg total) by mouth daily.   furosemide 20 MG tablet Commonly known as:  LASIX   mirabegron ER 25 MG Tb24 tablet Commonly known as:  MYRBETRIQ Take 1 tablet (25 mg total) by mouth daily.   multivitamin with minerals Tabs tablet        Physical Exam:  There were no vitals filed for this visit. There is no height or weight on file to calculate BMI.  Physical Exam  Constitutional: She appears well-developed and well-nourished. No distress.  HENT:  Head: Normocephalic and atraumatic.  Nose: Nose normal.  Neck: Normal  range of motion. Neck supple.  Cardiovascular: Normal rate, regular rhythm and normal heart sounds.  Pulmonary/Chest: Effort normal and breath sounds normal. No respiratory distress.  Abdominal: Soft. Bowel sounds are normal.  Musculoskeletal: Normal range of motion. She exhibits no edema.  Unsteady gait, uses cane  Neurological: She is alert. No cranial nerve deficit.  Oriented to person and place, not time; short term memory loss  Skin: Skin is warm and dry. No rash noted. No erythema.  Has chronic stasis derm hyperpigmentation of bilateral legs (mild)  Psychiatric: She has a normal mood and affect.  Labs reviewed: Basic Metabolic Panel: Recent Labs    07/09/16 1120 07/10/16 0412 07/21/16 0823  NA 140 138 139  K 4.4 3.6 4.0  CL 103 106 105  CO2 '26 24 26  '$ GLUCOSE 111* 111* 129*  BUN '9 7 10  '$ CREATININE 0.73 0.63 0.69  CALCIUM 9.6 8.7* 9.5   Liver Function Tests: Recent Labs    07/21/16 0823  AST 19  ALT 15  ALKPHOS 60  BILITOT 0.4  PROT 7.2  ALBUMIN 4.1   No results for input(s): LIPASE, AMYLASE in the last 8760 hours. No results for input(s): AMMONIA in the last 8760 hours. CBC: Recent Labs    07/09/16 1120 07/10/16 0412 07/21/16 0823  WBC 6.0 4.9 4.1  NEUTROABS 4.3  --  2,296  HGB 13.5 13.1 13.8  HCT 41.7 40.5 41.2  MCV 96.8 97.6 94.5  PLT 185 168 215   Lipid Panel: Recent Labs    07/21/16 0823  CHOL 179  HDL 84  LDLCALC 83  TRIG 59  CHOLHDL 2.1   TSH: No results for input(s): TSH in the last 8760 hours. A1C: Lab Results  Component Value Date   HGBA1C 6.0 (H) 03/19/2015     Assessment/Plan 1. Mild cognitive impairment with memory loss -progressive decline, discussed with granddaughter and pt about assisted living which granddaughter has looked into. Currently with necklace that alerts for falls and has GPS.  2. Venous insufficiency Stable, mild edema at this time.   3. OAB (overactive bladder) -not using mirabetriq, will refill at  this time. - mirabegron ER (MYRBETRIQ) 25 MG TB24 tablet; Take 1 tablet (25 mg total) by mouth daily.  Dispense: 90 tablet; Refill: 1  4. Essential hypertension, benign -stable, will cont current regimen - diltiazem (DILACOR XR) 240 MG 24 hr capsule; Take 1 capsule (240 mg total) by mouth daily.  Dispense: 90 capsule; Refill: 1 - CMP with eGFR  5. Facial pain -2 episodes, none currently. Will monitor for now and to notify if symptoms recur   6. Estrogen deficiency - DG Bone Density; Future  7. Hyperglycemia -noted on labs, will follow up A1c - Hemoglobin A1c  Next appt: 6 months.  Carlos American. Harle Battiest  [facility] [phone] 8 am - 5 pm) [phone] (after hours)

## 2017-05-20 LAB — COMPLETE METABOLIC PANEL WITH GFR
AG Ratio: 1.6 (calc) (ref 1.0–2.5)
ALT: 14 U/L (ref 6–29)
AST: 17 U/L (ref 10–35)
Albumin: 4.2 g/dL (ref 3.6–5.1)
Alkaline phosphatase (APISO): 63 U/L (ref 33–130)
BUN: 9 mg/dL (ref 7–25)
CO2: 26 mmol/L (ref 20–32)
Calcium: 9.6 mg/dL (ref 8.6–10.4)
Chloride: 104 mmol/L (ref 98–110)
Creat: 0.76 mg/dL (ref 0.60–0.88)
GFR, Est African American: 82 mL/min/{1.73_m2} (ref >=60)
GFR, Est Non African American: 71 mL/min/{1.73_m2} (ref >=60)
Globulin: 2.7 g/dL (calc) (ref 1.9–3.7)
Glucose, Bld: 102 mg/dL (ref 65–139)
Potassium: 4.3 mmol/L (ref 3.5–5.3)
Sodium: 140 mmol/L (ref 135–146)
Total Bilirubin: 0.5 mg/dL (ref 0.2–1.2)
Total Protein: 6.9 g/dL (ref 6.1–8.1)

## 2017-05-20 LAB — HEMOGLOBIN A1C
Hgb A1c MFr Bld: 5.9 % of total Hgb — ABNORMAL HIGH (ref <5.7)
Mean Plasma Glucose: 123 (calc)
eAG (mmol/L): 6.8 (calc)

## 2017-06-16 ENCOUNTER — Other Ambulatory Visit: Payer: Self-pay | Admitting: Internal Medicine

## 2017-06-20 ENCOUNTER — Ambulatory Visit: Payer: Medicare Other | Admitting: Podiatry

## 2017-06-20 DIAGNOSIS — M79676 Pain in unspecified toe(s): Secondary | ICD-10-CM | POA: Diagnosis not present

## 2017-06-20 DIAGNOSIS — B351 Tinea unguium: Secondary | ICD-10-CM | POA: Diagnosis not present

## 2017-06-21 NOTE — Progress Notes (Signed)
She presents with her grandson today for follow-up of her painful elongated toenails.  Objective: Ulcers remain strongly palpable.  Hammertoe deformities are present.  She has no open lesions or wounds.  Toenails are thick yellow dystrophic clinically mycotic.  These are painful on palpation as well as debridement.  Assessment: Pain in limb secondary to nail dystrophy and onychomycosis.  Plan: Debridement of toenails 1 through 5 bilateral cover service secondary to pain.

## 2017-06-26 ENCOUNTER — Telehealth: Payer: Self-pay | Admitting: Internal Medicine

## 2017-06-26 NOTE — Telephone Encounter (Signed)
Left msg asking pt to confirm this AWV-S w/ nurse. Last AWV 07/21/16. VDM (DD)

## 2017-09-19 ENCOUNTER — Ambulatory Visit: Payer: Medicare Other | Admitting: Podiatry

## 2017-09-19 ENCOUNTER — Encounter: Payer: Self-pay | Admitting: Podiatry

## 2017-09-19 DIAGNOSIS — B351 Tinea unguium: Secondary | ICD-10-CM

## 2017-09-19 DIAGNOSIS — M79676 Pain in unspecified toe(s): Secondary | ICD-10-CM

## 2017-09-19 NOTE — Progress Notes (Signed)
She presents today for a chief complaint of painful elongated toenails.  Objective: Vital signs stable alert and oriented x3.  Pulses are palpable.  Is a long thick yellow dystrophic with mycotic pain upon palpation as well as debridement.  Assessment: Onychomycosis.  Plan: Debridement of nails 1 through 5 bilateral.

## 2017-11-17 ENCOUNTER — Ambulatory Visit (INDEPENDENT_AMBULATORY_CARE_PROVIDER_SITE_OTHER): Payer: Medicare Other | Admitting: Nurse Practitioner

## 2017-11-17 ENCOUNTER — Encounter: Payer: Self-pay | Admitting: Nurse Practitioner

## 2017-11-17 ENCOUNTER — Ambulatory Visit (INDEPENDENT_AMBULATORY_CARE_PROVIDER_SITE_OTHER): Payer: Medicare Other

## 2017-11-17 ENCOUNTER — Other Ambulatory Visit: Payer: Self-pay | Admitting: Nurse Practitioner

## 2017-11-17 VITALS — BP 155/80 | HR 91 | Temp 98.3°F | Ht 65.0 in | Wt 150.0 lb

## 2017-11-17 VITALS — BP 146/68 | HR 91 | Temp 98.3°F | Ht 65.0 in | Wt 150.0 lb

## 2017-11-17 DIAGNOSIS — Z23 Encounter for immunization: Secondary | ICD-10-CM

## 2017-11-17 DIAGNOSIS — Z1239 Encounter for other screening for malignant neoplasm of breast: Secondary | ICD-10-CM

## 2017-11-17 DIAGNOSIS — I10 Essential (primary) hypertension: Secondary | ICD-10-CM

## 2017-11-17 DIAGNOSIS — Z0000 Encounter for general adult medical examination without abnormal findings: Secondary | ICD-10-CM

## 2017-11-17 DIAGNOSIS — R2681 Unsteadiness on feet: Secondary | ICD-10-CM | POA: Diagnosis not present

## 2017-11-17 DIAGNOSIS — G3184 Mild cognitive impairment, so stated: Secondary | ICD-10-CM | POA: Diagnosis not present

## 2017-11-17 DIAGNOSIS — Z1231 Encounter for screening mammogram for malignant neoplasm of breast: Secondary | ICD-10-CM | POA: Diagnosis not present

## 2017-11-17 DIAGNOSIS — E2839 Other primary ovarian failure: Secondary | ICD-10-CM

## 2017-11-17 DIAGNOSIS — I872 Venous insufficiency (chronic) (peripheral): Secondary | ICD-10-CM | POA: Diagnosis not present

## 2017-11-17 DIAGNOSIS — M542 Cervicalgia: Secondary | ICD-10-CM

## 2017-11-17 DIAGNOSIS — N3281 Overactive bladder: Secondary | ICD-10-CM | POA: Diagnosis not present

## 2017-11-17 LAB — COMPLETE METABOLIC PANEL WITH GFR
AG Ratio: 1.4 (calc) (ref 1.0–2.5)
ALT: 14 U/L (ref 6–29)
AST: 18 U/L (ref 10–35)
Albumin: 4.4 g/dL (ref 3.6–5.1)
Alkaline phosphatase (APISO): 66 U/L (ref 33–130)
BUN: 12 mg/dL (ref 7–25)
CO2: 27 mmol/L (ref 20–32)
Calcium: 10.1 mg/dL (ref 8.6–10.4)
Chloride: 102 mmol/L (ref 98–110)
Creat: 0.67 mg/dL (ref 0.60–0.88)
GFR, Est African American: 92 mL/min/{1.73_m2} (ref >=60)
GFR, Est Non African American: 79 mL/min/{1.73_m2} (ref >=60)
Globulin: 3.1 g/dL (calc) (ref 1.9–3.7)
Glucose, Bld: 96 mg/dL (ref 65–139)
Potassium: 3.9 mmol/L (ref 3.5–5.3)
Sodium: 138 mmol/L (ref 135–146)
Total Bilirubin: 0.5 mg/dL (ref 0.2–1.2)
Total Protein: 7.5 g/dL (ref 6.1–8.1)

## 2017-11-17 LAB — CBC WITH DIFFERENTIAL/PLATELET
Basophils Absolute: 30 cells/uL (ref 0–200)
Basophils Relative: 0.5 %
Eosinophils Absolute: 71 cells/uL (ref 15–500)
Eosinophils Relative: 1.2 %
HCT: 40.3 % (ref 35.0–45.0)
Hemoglobin: 13.8 g/dL (ref 11.7–15.5)
Lymphs Abs: 1392 cells/uL (ref 850–3900)
MCH: 31.9 pg (ref 27.0–33.0)
MCHC: 34.2 g/dL (ref 32.0–36.0)
MCV: 93.3 fL (ref 80.0–100.0)
MPV: 10.3 fL (ref 7.5–12.5)
Monocytes Relative: 8.4 %
Neutro Abs: 3912 cells/uL (ref 1500–7800)
Neutrophils Relative %: 66.3 %
Platelets: 210 10*3/uL (ref 140–400)
RBC: 4.32 10*6/uL (ref 3.80–5.10)
RDW: 12 % (ref 11.0–15.0)
Total Lymphocyte: 23.6 %
WBC mixed population: 496 cells/uL (ref 200–950)
WBC: 5.9 10*3/uL (ref 3.8–10.8)

## 2017-11-17 MED ORDER — NAPROXEN 250 MG PO TABS: ORAL_TABLET | ORAL | 0 refills | Status: DC

## 2017-11-17 MED ORDER — MIRABEGRON ER 25 MG PO TB24: 25.0000 mg | ORAL_TABLET | Freq: Every day | ORAL | 1 refills | Status: DC

## 2017-11-17 NOTE — Progress Notes (Signed)
Careteam: Patient Care Team: Gayland Curry, DO as PCP - General (Geriatric Medicine)  Advanced Directive information Does Patient Have a Medical Advance Directive?: Yes, Type of Advance Directive: Healthcare Power of Attorney  Allergies  Allergen Reactions  . Amoxicillin Anaphylaxis and Other (See Comments)    Has patient had a PCN reaction causing immediate rash, facial/tongue/throat swelling, SOB or lightheadedness with hypotension: Yes Has patient had a PCN reaction causing severe rash involving mucus membranes or skin necrosis: No Has patient had a PCN reaction that required hospitalization No Has patient had a PCN reaction occurring within the last 10 years: No If all of the above answers are "NO", then may proceed with Cephalosporin use.  . Angiotensin Receptor Blockers Anaphylaxis, Itching and Rash  . Beta Adrenergic Blockers Anaphylaxis, Itching and Rash  . Cephalexin Anaphylaxis  . Clindamycin/Lincomycin Anaphylaxis, Itching and Rash  . Eggs Or Egg-Derived Products Other (See Comments)    Reaction:  Blisters in mouth   . Tape Other (See Comments)    Reaction:  Pulls skin off   . Corticosteroids Itching and Rash  . Hydrocortisone Itching and Rash  . Latex Itching and Rash  . Other Itching, Rash and Other (See Comments)    Pt states that she has a pine allergy and she is only able to use fragrance free soaps and laundry products.    . Reglan [Metoclopramide] Itching and Rash    Chief Complaint  Patient presents with  . Medical Management of Chronic Issues    Pt is being seen for a 6 month routine visit.      HPI: Patient is a 84 y.o. female seen in the office today for routine follow up. Pt with hx of OAB, htn, mild memory loss, venous insufficiency.  Had her AWV today. Has a pain in her neck which she has had before. Pain went away and now back and worse.  Can not turn her head to the left without it causing her pain.  Has used tylenol when it got really bad.  Worse when she turn her neck. 8/10. Feels a knot. Can not describe pain. Does not radiate into arm.   Overactive bladder- granddaughter does not think she is taking anything.  Granddaughter reports symptoms are terrible. States she think she was getting confused about what medications were causing what symptoms.   htn- 146/68 on recheck today. Reports lower at home.   Le edema- stable, has not needed lasix in a long time.   Eye sight is poor. Seeing ophthalmologist for wet macular degeneration    Review of Systems:  Review of Systems  Constitutional: Negative for chills and fever.  HENT: Negative for congestion and hearing loss.   Eyes: Positive for blurred vision. Negative for double vision, photophobia, pain, discharge and redness.  Respiratory: Negative for cough and shortness of breath.   Cardiovascular: Negative for chest pain, palpitations and leg swelling.  Genitourinary: Positive for frequency and urgency. Negative for dysuria.  Musculoskeletal: Positive for neck pain (left side. ). Negative for falls.       Uses cane Unsteady gait  Skin: Negative for itching and rash.  Neurological: Negative for dizziness, weakness and headaches.  Psychiatric/Behavioral: Positive for memory loss (mild).    Past Medical History:  Diagnosis Date  . Anxiety state, unspecified   . Bunion   . Contact dermatitis and other eczema due to other chemical products   . Edema   . External hemorrhoids without mention of complication   .  GERD (gastroesophageal reflux disease)   . Hypertension   . Malignant neoplasm of breast (female), unspecified site dx'd 1996   rt breast; xrt   . Pain in joint, pelvic region and thigh   . Rash and other nonspecific skin eruption   . Stroke ([place])   . Unspecified cataract   . Unspecified late effects of cerebrovascular disease   . Viral hepatitis A without mention of hepatic coma   . Viral hepatitis A without mention of hepatic coma   . Vitamin D deficiency      Past Surgical History:  Procedure Laterality Date  . BREAST LUMPECTOMY  1995  . CARDIAC CATHETERIZATION  2004  . CHOLECYSTECTOMY  1972   Social History:   reports that she has never smoked. She has never used smokeless tobacco. She reports that she does not drink alcohol or use drugs.  Family History  Problem Relation Age of Onset  . Cancer Mother        lung  . Cancer Brother        brain  . Varicose Veins Father   . Varicose Veins Sister     Medications: Patient's Medications  New Prescriptions   No medications on file  Previous Medications   ACETAMINOPHEN (TYLENOL) 325 MG TABLET    Take 325 mg by mouth every 6 (six) hours as needed for mild pain or headache.   CHOLECALCIFEROL (VITAMIN D) 1000 UNITS TABLET    Take 1,000 Units by mouth daily.    DILTIAZEM (DILACOR XR) 240 MG 24 HR CAPSULE    Take 1 capsule (240 mg total) by mouth daily.   FUROSEMIDE (LASIX) 20 MG TABLET    Take 20 mg by mouth daily as needed for edema.   MIRABEGRON ER (MYRBETRIQ) 25 MG TB24 TABLET    Take 1 tablet (25 mg total) by mouth daily.   MULTIPLE VITAMIN (MULTIVITAMIN WITH MINERALS) TABS TABLET    Take 1 tablet by mouth daily.  Modified Medications   No medications on file  Discontinued Medications   No medications on file     Physical Exam:  Vitals:   11/17/17 1116  BP: (!) 155/80  Pulse: 91  Temp: 98.3 F (36.8 C)  TempSrc: Oral  SpO2: 95%  Weight: 150 lb (68 kg)  Height: 5\' 5"  (1.651 m)   Body mass index is 24.96 kg/m.  Physical Exam  Constitutional: She appears well-developed and well-nourished. No distress.  HENT:  Head: Normocephalic and atraumatic.    Nose: Nose normal.  Tenderness noted to left side of neck with decrease in ROM  Neck: Normal range of motion. Neck supple.  Cardiovascular: Normal rate, regular rhythm and normal heart sounds.  Pulmonary/Chest: Effort normal and breath sounds normal. No respiratory distress.  Abdominal: Soft. Bowel sounds are normal.   Musculoskeletal: Normal range of motion. She exhibits edema (1+ bilaterally).  Unsteady gait, uses cane  Neurological: She is alert. No cranial nerve deficit.  Oriented to person and place, not time; short term memory loss  Skin: Skin is warm and dry. No rash noted. No erythema.  Has chronic stasis derm hyperpigmentation of bilateral legs (mild)  Psychiatric: She has a normal mood and affect.    Labs reviewed: Basic Metabolic Panel: Recent Labs    05/19/17 1044  NA 140  K 4.3  CL 104  CO2 26  GLUCOSE 102  BUN 9  CREATININE 0.76  CALCIUM 9.6   Liver Function Tests: Recent Labs    05/19/17 1044  AST 17  ALT 14  BILITOT 0.5  PROT 6.9   No results for input(s): LIPASE, AMYLASE in the last 8760 hours. No results for input(s): AMMONIA in the last 8760 hours. CBC: No results for input(s): WBC, NEUTROABS, HGB, HCT, MCV, PLT in the last 8760 hours. Lipid Panel: No results for input(s): CHOL, HDL, LDLCALC, TRIG, CHOLHDL, LDLDIRECT in the last 8760 hours. TSH: No results for input(s): TSH in the last 8760 hours. A1C: Lab Results  Component Value Date   HGBA1C 5.9 (H) 05/19/2017     Assessment/Plan 1. OAB (overactive bladder) -ongoing, stopped myrbetriq but would like to try again.  - mirabegron ER (MYRBETRIQ) 25 MG TB24 tablet; Take 1 tablet (25 mg total) by mouth daily.  Dispense: 90 tablet; Refill: 1  2. Mild cognitive impairment with memory loss MMSE 24/30. Family very involved. She is still very independent but loss of vision due to MD has caused some barriers. Family managing finances and plans to start managing medications soon as well.   3. Essential hypertension, benign -stable, will continue current regimen.  - COMPLETE METABOLIC PANEL WITH GFR - CBC with Differential/Platelets  4. Venous insufficiency Stable  5. Unsteady gait -several almost falls. Home bound and does not get much activity outside of house work.  - Ambulatory referral to [place]  for gait and strength training. Also may benefit from use of walker.   6. Neck pain on left side - Ambulatory referral to [facility] [place] - naproxen (NAPROSYN) 250 MG tablet; 1 tablet twice daily for 5 days; may use every 12 hours as needed pain but to use sparingly after the 5 days. Dispense: 30 tablet; Refill: 0 -To use heat to neck 2-3 times daily for ~30 mins to use muscle rub AFTER heat for 1 week.    Next appt: 6 months, sooner if needed Libbi Towner K. [place], [facility] [phone]

## 2017-11-17 NOTE — Patient Instructions (Addendum)
Beth Foster , Thank you for taking time to come for your Medicare Wellness Visit. I appreciate your ongoing commitment to your health goals. Please review the following plan we discussed and let me know if I can assist you in the future.   Screening recommendations/referrals: Colonoscopy excluded, over age 82 Mammogram due, ordered Bone Density due, ordered Recommended yearly ophthalmology/optometry visit for glaucoma screening and checkup Recommended yearly dental visit for hygiene and checkup  Vaccinations: Influenza vaccine up to date, due 2019 fall season Pneumococcal vaccine 23 given today. Completed Tdap vaccine up to date, due 01/09/2025 Shingles vaccine due    Advanced directives: in chart  Conditions/risks identified: none  Next appointment: Tyson Dense, RN 11/19/2018 @ 10am   Preventive Care 65 Years and Older, Female Preventive care refers to lifestyle choices and visits with your health care provider that can promote health and wellness. What does preventive care include?  A yearly physical exam. This is also called an annual well check.  Dental exams once or twice a year.  Routine eye exams. Ask your health care provider how often you should have your eyes checked.  Personal lifestyle choices, including:  Daily care of your teeth and gums.  Regular physical activity.  Eating a healthy diet.  Avoiding tobacco and drug use.  Limiting alcohol use.  Practicing safe sex.  Taking low-dose aspirin every day.  Taking vitamin and mineral supplements as recommended by your health care provider. What happens during an annual well check? The services and screenings done by your health care provider during your annual well check will depend on your age, overall health, lifestyle risk factors, and family history of disease. Counseling  Your health care provider may ask you questions about your:  Alcohol use.  Tobacco use.  Drug use.  Emotional  well-being.  Home and relationship well-being.  Sexual activity.  Eating habits.  History of falls.  Memory and ability to understand (cognition).  Work and work Statistician.  Reproductive health. Screening  You may have the following tests or measurements:  Height, weight, and BMI.  Blood pressure.  Lipid and cholesterol levels. These may be checked every 5 years, or more frequently if you are over 58 years old.  Skin check.  Lung cancer screening. You may have this screening every year starting at age 66 if you have a 30-pack-year history of smoking and currently smoke or have quit within the past 15 years.  Fecal occult blood test (FOBT) of the stool. You may have this test every year starting at age 74.  Flexible sigmoidoscopy or colonoscopy. You may have a sigmoidoscopy every 5 years or a colonoscopy every 10 years starting at age 72.  Hepatitis C blood test.  Hepatitis B blood test.  Sexually transmitted disease (STD) testing.  Diabetes screening. This is done by checking your blood sugar (glucose) after you have not eaten for a while (fasting). You may have this done every 1-3 years.  Bone density scan. This is done to screen for osteoporosis. You may have this done starting at age 69.  Mammogram. This may be done every 1-2 years. Talk to your health care provider about how often you should have regular mammograms. Talk with your health care provider about your test results, treatment options, and if necessary, the need for more tests. Vaccines  Your health care provider may recommend certain vaccines, such as:  Influenza vaccine. This is recommended every year.  Tetanus, diphtheria, and acellular pertussis (Tdap, Td) vaccine. You may  need a Td booster every 10 years.  Zoster vaccine. You may need this after age 60.  Pneumococcal 13-valent conjugate (PCV13) vaccine. One dose is recommended after age 31.  Pneumococcal polysaccharide (PPSV23) vaccine. One  dose is recommended after age 33. Talk to your health care provider about which screenings and vaccines you need and how often you need them. This information is not intended to replace advice given to you by your health care provider. Make sure you discuss any questions you have with your health care provider. Document Released: 06/05/2015 Document Revised: 01/27/2016 Document Reviewed: 03/10/2015 Elsevier Interactive Patient Education  2017 [place] Prevention in the Home Falls can cause injuries. They can happen to people of all ages. There are many things you can do to make your home safe and to help prevent falls. What can I do on the outside of my home?  Regularly fix the edges of walkways and driveways and fix any cracks.  Remove anything that might make you trip as you walk through a door, such as a raised step or threshold.  Trim any bushes or trees on the path to your home.  Use bright outdoor lighting.  Clear any walking paths of anything that might make someone trip, such as rocks or tools.  Regularly check to see if handrails are loose or broken. Make sure that both sides of any steps have handrails.  Any raised decks and porches should have guardrails on the edges.  Have any leaves, snow, or ice cleared regularly.  Use sand or salt on walking paths during winter.  Clean up any spills in your garage right away. This includes oil or grease spills. What can I do in the bathroom?  Use night lights.  Install grab bars by the toilet and in the tub and shower. Do not use towel bars as grab bars.  Use non-skid mats or decals in the tub or shower.  If you need to sit down in the shower, use a plastic, non-slip stool.  Keep the floor dry. Clean up any water that spills on the floor as soon as it happens.  Remove soap buildup in the tub or shower regularly.  Attach bath mats securely with double-sided non-slip rug tape.  Do not have throw rugs and other  things on the floor that can make you trip. What can I do in the bedroom?  Use night lights.  Make sure that you have a light by your bed that is easy to reach.  Do not use any sheets or blankets that are too big for your bed. They should not hang down onto the floor.  Have a firm chair that has side arms. You can use this for support while you get dressed.  Do not have throw rugs and other things on the floor that can make you trip. What can I do in the kitchen?  Clean up any spills right away.  Avoid walking on wet floors.  Keep items that you use a lot in easy-to-reach places.  If you need to reach something above you, use a strong step stool that has a grab bar.  Keep electrical cords out of the way.  Do not use floor polish or wax that makes floors slippery. If you must use wax, use non-skid floor wax.  Do not have throw rugs and other things on the floor that can make you trip. What can I do with my stairs?  Do not leave any items on  the stairs.  Make sure that there are handrails on both sides of the stairs and use them. Fix handrails that are broken or loose. Make sure that handrails are as long as the stairways.  Check any carpeting to make sure that it is firmly attached to the stairs. Fix any carpet that is loose or worn.  Avoid having throw rugs at the top or bottom of the stairs. If you do have throw rugs, attach them to the floor with carpet tape.  Make sure that you have a light switch at the top of the stairs and the bottom of the stairs. If you do not have them, ask someone to add them for you. What else can I do to help prevent falls?  Wear shoes that:  Do not have high heels.  Have rubber bottoms.  Are comfortable and fit you well.  Are closed at the toe. Do not wear sandals.  If you use a stepladder:  Make sure that it is fully opened. Do not climb a closed stepladder.  Make sure that both sides of the stepladder are locked into place.  Ask  someone to hold it for you, if possible.  Clearly mark and make sure that you can see:  Any grab bars or handrails.  First and last steps.  Where the edge of each step is.  Use tools that help you move around (mobility aids) if they are needed. These include:  Canes.  Walkers.  Scooters.  Crutches.  Turn on the lights when you go into a dark area. Replace any light bulbs as soon as they burn out.  Set up your furniture so you have a clear path. Avoid moving your furniture around.  If any of your floors are uneven, fix them.  If there are any pets around you, be aware of where they are.  Review your medicines with your doctor. Some medicines can make you feel dizzy. This can increase your chance of falling. Ask your doctor what other things that you can do to help prevent falls. This information is not intended to replace advice given to you by your health care provider. Make sure you discuss any questions you have with your health care provider. Document Released: 03/05/2009 Document Revised: 10/15/2015 Document Reviewed: 06/13/2014 Elsevier Interactive Patient Education  2017 [employer].

## 2017-11-17 NOTE — Patient Instructions (Signed)
For neck- Naproxen 1 tablet twice daily for 5 days To use heat to neck 2-3 times daily for ~30 mins to use muscle rub AFTER heat for 1 week.   Home health has been consulted as well to help with gait and mobility  To start myrbetriq 25 mg by mouth daily, may take several weeks to get full effects.

## 2017-11-17 NOTE — Progress Notes (Signed)
Subjective:   TWYLAH BENNETTS is a 84 y.o. female who presents for Medicare Annual (Subsequent) preventive examination.  Last AWV-07/21/2016    Objective:     Vitals: BP (!) 146/68 (BP Location: Left Arm, Patient Position: Sitting)   Pulse 91   Temp 98.3 F (36.8 C) (Oral)   Ht 5\' 5"  (1.651 m)   Wt 150 lb (68 kg)   SpO2 95%   BMI 24.96 kg/m   Body mass index is 24.96 kg/m.  Advanced Directives 11/17/2017 11/17/2017 05/19/2017 07/25/2016 07/21/2016 07/15/2016 07/09/2016  Does Patient Have a Medical Advance Directive? Yes Yes Yes Yes Yes Yes No  Type of Arts administrator Power of [place];Living will [place];Living will -  Does patient want to make changes to medical advance directive? - No - Patient declined - - - - -  Copy of [place] in Chart? Yes Yes Yes Yes Yes Yes -  Would patient like information on creating a medical advance directive? - - - - - - -  Pre-existing out of facility DNR order (yellow form or pink MOST form) - - - - - - -    Tobacco Social History   Tobacco Use  Smoking Status Never Smoker  Smokeless Tobacco Never Used     Counseling given: Not Answered   Clinical Intake:  Pre-visit preparation completed: No  Pain : 0-10 Pain Score: 8  Pain Type: Chronic pain Pain Location: Neck Pain Orientation: Left Pain Descriptors / Indicators: Aching Pain Onset: More than a month ago Pain Frequency: Intermittent     Nutritional Risks: None Diabetes: No  How often do you need to have someone help you when you read instructions, pamphlets, or other written materials from your doctor or pharmacy?: 3 - Sometimes What is the last grade level you completed in school?: 9th grade  Interpreter Needed?: No  Information entered by :: Tyson Dense, RN  Past Medical History:  Diagnosis Date  . Anxiety  state, unspecified   . Bunion   . Contact dermatitis and other eczema due to other chemical products   . Edema   . External hemorrhoids without mention of complication   . GERD (gastroesophageal reflux disease)   . Hypertension   . Malignant neoplasm of breast (female), unspecified site dx'd 1996   rt breast; xrt   . Pain in joint, pelvic region and thigh   . Rash and other nonspecific skin eruption   . Stroke ([place])   . Unspecified cataract   . Unspecified late effects of cerebrovascular disease   . Viral hepatitis A without mention of hepatic coma   . Viral hepatitis A without mention of hepatic coma   . Vitamin D deficiency    Past Surgical History:  Procedure Laterality Date  . BREAST LUMPECTOMY  1995  . CARDIAC CATHETERIZATION  2004  . CHOLECYSTECTOMY  1972   Family History  Problem Relation Age of Onset  . Cancer Mother        lung  . Cancer Brother        brain  . Varicose Veins Father   . Varicose Veins Sister    Social History   Socioeconomic History  . Marital status: Widowed    Spouse name: Not on file  . Number of children: Not on file  . Years of education: Not on file  . Highest education level:  Not on file  Occupational History  . Not on file  Social Needs  . Financial resource strain: Not hard at all  . Food insecurity:    Worry: Never true    Inability: Never true  . Transportation needs:    Medical: No    Non-medical: No  Tobacco Use  . Smoking status: Never Smoker  . Smokeless tobacco: Never Used  Substance and Sexual Activity  . Alcohol use: No  . Drug use: No  . Sexual activity: Never  Lifestyle  . Physical activity:    Days per week: 0 days    Minutes per session: 0 min  . Stress: Only a little  Relationships  . Social connections:    Talks on phone: Three times a week    Gets together: Three times a week    Attends religious service: Never    Active member of club or organization: No    Attends meetings of clubs or  organizations: Never    Relationship status: Widowed  Other Topics Concern  . Not on file  Social History Narrative  . Not on file    Outpatient Encounter Medications as of 11/17/2017  Medication Sig  . acetaminophen (TYLENOL) 325 MG tablet Take 325 mg by mouth every 6 (six) hours as needed for mild pain or headache.  . cholecalciferol (VITAMIN D) 1000 UNITS tablet Take 1,000 Units by mouth daily.   Marland Kitchen diltiazem (DILACOR XR) 240 MG 24 hr capsule Take 1 capsule (240 mg total) by mouth daily.  . furosemide (LASIX) 20 MG tablet Take 20 mg by mouth daily as needed for edema.  . Multiple Vitamin (MULTIVITAMIN WITH MINERALS) TABS tablet Take 1 tablet by mouth daily.  . mirabegron ER (MYRBETRIQ) 25 MG TB24 tablet Take 1 tablet (25 mg total) by mouth daily.   No facility-administered encounter medications on file as of 11/17/2017.     Activities of Daily Living In your present state of health, do you have any difficulty performing the following activities: 11/17/2017  Hearing? N  Vision? Y  Difficulty concentrating or making decisions? Y  Walking or climbing stairs? Y  Dressing or bathing? N  Doing errands, shopping? Y  Preparing Food and eating ? N  Using the Toilet? N  In the past six months, have you accidently leaked urine? Y  Do you have problems with loss of bowel control? N  Managing your Medications? N  Managing your Finances? N  Housekeeping or managing your Housekeeping? N  Some recent data might be hidden    Patient Care Team: Gayland Curry, DO as PCP - General (Geriatric Medicine)    Assessment:   This is a routine wellness examination for Mayrene.  Exercise Activities and Dietary recommendations Current Exercise Habits: The patient does not participate in regular exercise at present, Exercise limited by: orthopedic condition(s)  Goals    None      Fall Risk Fall Risk  11/17/2017 11/17/2017 05/19/2017 07/21/2016 07/15/2016  Falls in the past year? No No No No No    Number falls in past yr: - - - - -  Comment - - - - -  Injury with Fall? - - - - -  Risk for fall due to : - - - History of fall(s) -   Is the patient's home free of loose throw rugs in walkways, pet beds, electrical cords, etc?   yes      Grab bars in the bathroom? yes  Handrails on the stairs?   yes      Adequate lighting?   yes  Depression Screen PHQ 2/9 Scores 11/17/2017 07/21/2016 07/15/2016 01/01/2016  PHQ - 2 Score 0 0 0 0     Cognitive Function MMSE - Mini Mental State Exam 11/17/2017 07/21/2016 07/03/2014  Orientation to time 3 5 5   Orientation to Place 3 4 5   Registration 3 3 3   Attention/ Calculation 5 5 5   Recall 1 3 3   Language- name 2 objects 2 2 2   Language- repeat 1 1 1   Language- follow 3 step command 3 3 3   Language- read & follow direction 1 1 1   Write a sentence 1 1 1   Copy design 1 1 0  Total score 24 29 29         Immunization History  Administered Date(s) Administered  . Influenza, High Dose Seasonal PF 03/15/2017  . Influenza,inj,Quad PF,6+ Mos 03/14/2013, 02/28/2014, 03/19/2015, 02/11/2016  . Pneumococcal Conjugate-13 07/25/2016  . Pneumococcal Polysaccharide-23 11/17/2017  . Tdap 01/10/2015    Qualifies for Shingles Vaccine? Yes, educated and will think about i  Screening Tests Health Maintenance  Topic Date Due  . DEXA SCAN  10/23/1995  . INFLUENZA VACCINE  12/21/2017  . TETANUS/TDAP  01/09/2025  . PNA vac Low Risk Adult  Completed    Cancer Screenings: Lung: Low Dose CT Chest recommended if Age 13-80 years, 30 pack-year currently smoking OR have quit w/in 15years. Patient does not qualify. Breast:  Up to date on Mammogram? No, ordered  Up to date of Bone Density/Dexa? No, ordered Colorectal: up to date  Additional Screenings:  Hepatitis C Screening: declined Pneumovax due: given    Plan:    I have personally reviewed and addressed the Medicare Annual Wellness questionnaire and have noted the following in the patient's chart:   A. Medical and social history B. Use of alcohol, tobacco or illicit drugs  C. Current medications and supplements D. Functional ability and status E.  Nutritional status F.  Physical activity G. Advance directives H. List of other physicians I.  Hospitalizations, surgeries, and ER visits in previous 12 months J.  [place] to include hearing, vision, cognitive, depression L. Referrals and appointments - none  In addition, I have reviewed and discussed with patient certain preventive protocols, quality metrics, and best practice recommendations. A written personalized care plan for preventive services as well as general preventive health recommendations were provided to patient.  See attached scanned questionnaire for additional information.   Signed,   Tyson Dense, RN Nurse Health Advisor  Patient Concerns: L neck pain starting a couple months ago

## 2017-11-28 DIAGNOSIS — Z853 Personal history of malignant neoplasm of breast: Secondary | ICD-10-CM | POA: Diagnosis not present

## 2017-11-28 DIAGNOSIS — N3281 Overactive bladder: Secondary | ICD-10-CM | POA: Diagnosis not present

## 2017-11-28 DIAGNOSIS — M542 Cervicalgia: Secondary | ICD-10-CM | POA: Diagnosis not present

## 2017-11-28 DIAGNOSIS — E559 Vitamin D deficiency, unspecified: Secondary | ICD-10-CM | POA: Diagnosis not present

## 2017-11-28 DIAGNOSIS — I872 Venous insufficiency (chronic) (peripheral): Secondary | ICD-10-CM | POA: Diagnosis not present

## 2017-11-28 DIAGNOSIS — J309 Allergic rhinitis, unspecified: Secondary | ICD-10-CM | POA: Diagnosis not present

## 2017-11-28 DIAGNOSIS — G3184 Mild cognitive impairment, so stated: Secondary | ICD-10-CM | POA: Diagnosis not present

## 2017-11-28 DIAGNOSIS — I10 Essential (primary) hypertension: Secondary | ICD-10-CM

## 2017-11-28 DIAGNOSIS — K219 Gastro-esophageal reflux disease without esophagitis: Secondary | ICD-10-CM | POA: Diagnosis not present

## 2017-11-28 DIAGNOSIS — E785 Hyperlipidemia, unspecified: Secondary | ICD-10-CM | POA: Diagnosis not present

## 2017-11-29 ENCOUNTER — Other Ambulatory Visit: Payer: Self-pay | Admitting: Nurse Practitioner

## 2017-11-29 DIAGNOSIS — M542 Cervicalgia: Secondary | ICD-10-CM

## 2017-11-29 NOTE — Telephone Encounter (Signed)
A refill request was received for naproxen 250 mg. This medication was prescribed on 11/17/17 as treatment for left side neck pain. Rx was pended to provider for approval.

## 2017-12-04 DIAGNOSIS — G3184 Mild cognitive impairment, so stated: Secondary | ICD-10-CM | POA: Diagnosis not present

## 2017-12-04 DIAGNOSIS — I872 Venous insufficiency (chronic) (peripheral): Secondary | ICD-10-CM | POA: Diagnosis not present

## 2017-12-04 DIAGNOSIS — M542 Cervicalgia: Secondary | ICD-10-CM | POA: Diagnosis not present

## 2017-12-04 DIAGNOSIS — I10 Essential (primary) hypertension: Secondary | ICD-10-CM | POA: Diagnosis not present

## 2017-12-04 DIAGNOSIS — N3281 Overactive bladder: Secondary | ICD-10-CM | POA: Diagnosis not present

## 2017-12-04 DIAGNOSIS — E785 Hyperlipidemia, unspecified: Secondary | ICD-10-CM | POA: Diagnosis not present

## 2017-12-04 DIAGNOSIS — E559 Vitamin D deficiency, unspecified: Secondary | ICD-10-CM | POA: Diagnosis not present

## 2017-12-04 DIAGNOSIS — K219 Gastro-esophageal reflux disease without esophagitis: Secondary | ICD-10-CM | POA: Diagnosis not present

## 2017-12-04 DIAGNOSIS — J309 Allergic rhinitis, unspecified: Secondary | ICD-10-CM | POA: Diagnosis not present

## 2017-12-04 DIAGNOSIS — Z853 Personal history of malignant neoplasm of breast: Secondary | ICD-10-CM | POA: Diagnosis not present

## 2017-12-13 ENCOUNTER — Other Ambulatory Visit: Payer: Self-pay | Admitting: Nurse Practitioner

## 2017-12-13 DIAGNOSIS — I10 Essential (primary) hypertension: Secondary | ICD-10-CM

## 2017-12-14 DIAGNOSIS — G3184 Mild cognitive impairment, so stated: Secondary | ICD-10-CM | POA: Diagnosis not present

## 2017-12-14 DIAGNOSIS — J309 Allergic rhinitis, unspecified: Secondary | ICD-10-CM | POA: Diagnosis not present

## 2017-12-14 DIAGNOSIS — K219 Gastro-esophageal reflux disease without esophagitis: Secondary | ICD-10-CM | POA: Diagnosis not present

## 2017-12-14 DIAGNOSIS — N3281 Overactive bladder: Secondary | ICD-10-CM | POA: Diagnosis not present

## 2017-12-14 DIAGNOSIS — I10 Essential (primary) hypertension: Secondary | ICD-10-CM | POA: Diagnosis not present

## 2017-12-14 DIAGNOSIS — E785 Hyperlipidemia, unspecified: Secondary | ICD-10-CM | POA: Diagnosis not present

## 2017-12-14 DIAGNOSIS — E559 Vitamin D deficiency, unspecified: Secondary | ICD-10-CM | POA: Diagnosis not present

## 2017-12-14 DIAGNOSIS — I872 Venous insufficiency (chronic) (peripheral): Secondary | ICD-10-CM | POA: Diagnosis not present

## 2017-12-14 DIAGNOSIS — M542 Cervicalgia: Secondary | ICD-10-CM | POA: Diagnosis not present

## 2017-12-14 DIAGNOSIS — Z853 Personal history of malignant neoplasm of breast: Secondary | ICD-10-CM | POA: Diagnosis not present

## 2017-12-15 DIAGNOSIS — Z853 Personal history of malignant neoplasm of breast: Secondary | ICD-10-CM | POA: Diagnosis not present

## 2017-12-15 DIAGNOSIS — E559 Vitamin D deficiency, unspecified: Secondary | ICD-10-CM | POA: Diagnosis not present

## 2017-12-15 DIAGNOSIS — K219 Gastro-esophageal reflux disease without esophagitis: Secondary | ICD-10-CM | POA: Diagnosis not present

## 2017-12-15 DIAGNOSIS — M542 Cervicalgia: Secondary | ICD-10-CM | POA: Diagnosis not present

## 2017-12-15 DIAGNOSIS — G3184 Mild cognitive impairment, so stated: Secondary | ICD-10-CM | POA: Diagnosis not present

## 2017-12-15 DIAGNOSIS — I10 Essential (primary) hypertension: Secondary | ICD-10-CM | POA: Diagnosis not present

## 2017-12-15 DIAGNOSIS — J309 Allergic rhinitis, unspecified: Secondary | ICD-10-CM | POA: Diagnosis not present

## 2017-12-15 DIAGNOSIS — I872 Venous insufficiency (chronic) (peripheral): Secondary | ICD-10-CM | POA: Diagnosis not present

## 2017-12-15 DIAGNOSIS — N3281 Overactive bladder: Secondary | ICD-10-CM | POA: Diagnosis not present

## 2017-12-15 DIAGNOSIS — E785 Hyperlipidemia, unspecified: Secondary | ICD-10-CM | POA: Diagnosis not present

## 2017-12-19 ENCOUNTER — Ambulatory Visit: Payer: Medicare Other | Admitting: Podiatry

## 2017-12-19 DIAGNOSIS — Z853 Personal history of malignant neoplasm of breast: Secondary | ICD-10-CM | POA: Diagnosis not present

## 2017-12-19 DIAGNOSIS — I10 Essential (primary) hypertension: Secondary | ICD-10-CM | POA: Diagnosis not present

## 2017-12-19 DIAGNOSIS — K219 Gastro-esophageal reflux disease without esophagitis: Secondary | ICD-10-CM | POA: Diagnosis not present

## 2017-12-19 DIAGNOSIS — I872 Venous insufficiency (chronic) (peripheral): Secondary | ICD-10-CM | POA: Diagnosis not present

## 2017-12-19 DIAGNOSIS — G3184 Mild cognitive impairment, so stated: Secondary | ICD-10-CM | POA: Diagnosis not present

## 2017-12-19 DIAGNOSIS — N3281 Overactive bladder: Secondary | ICD-10-CM | POA: Diagnosis not present

## 2017-12-19 DIAGNOSIS — E785 Hyperlipidemia, unspecified: Secondary | ICD-10-CM | POA: Diagnosis not present

## 2017-12-19 DIAGNOSIS — J309 Allergic rhinitis, unspecified: Secondary | ICD-10-CM | POA: Diagnosis not present

## 2017-12-19 DIAGNOSIS — M542 Cervicalgia: Secondary | ICD-10-CM | POA: Diagnosis not present

## 2017-12-19 DIAGNOSIS — E559 Vitamin D deficiency, unspecified: Secondary | ICD-10-CM | POA: Diagnosis not present

## 2017-12-21 DIAGNOSIS — I872 Venous insufficiency (chronic) (peripheral): Secondary | ICD-10-CM | POA: Diagnosis not present

## 2017-12-21 DIAGNOSIS — Z853 Personal history of malignant neoplasm of breast: Secondary | ICD-10-CM | POA: Diagnosis not present

## 2017-12-21 DIAGNOSIS — N3281 Overactive bladder: Secondary | ICD-10-CM | POA: Diagnosis not present

## 2017-12-21 DIAGNOSIS — G3184 Mild cognitive impairment, so stated: Secondary | ICD-10-CM | POA: Diagnosis not present

## 2017-12-21 DIAGNOSIS — J309 Allergic rhinitis, unspecified: Secondary | ICD-10-CM | POA: Diagnosis not present

## 2017-12-21 DIAGNOSIS — K219 Gastro-esophageal reflux disease without esophagitis: Secondary | ICD-10-CM | POA: Diagnosis not present

## 2017-12-21 DIAGNOSIS — I10 Essential (primary) hypertension: Secondary | ICD-10-CM | POA: Diagnosis not present

## 2017-12-21 DIAGNOSIS — E559 Vitamin D deficiency, unspecified: Secondary | ICD-10-CM | POA: Diagnosis not present

## 2017-12-21 DIAGNOSIS — E785 Hyperlipidemia, unspecified: Secondary | ICD-10-CM | POA: Diagnosis not present

## 2017-12-21 DIAGNOSIS — M542 Cervicalgia: Secondary | ICD-10-CM | POA: Diagnosis not present

## 2017-12-25 DIAGNOSIS — J309 Allergic rhinitis, unspecified: Secondary | ICD-10-CM | POA: Diagnosis not present

## 2017-12-25 DIAGNOSIS — N3281 Overactive bladder: Secondary | ICD-10-CM | POA: Diagnosis not present

## 2017-12-25 DIAGNOSIS — K219 Gastro-esophageal reflux disease without esophagitis: Secondary | ICD-10-CM | POA: Diagnosis not present

## 2017-12-25 DIAGNOSIS — E559 Vitamin D deficiency, unspecified: Secondary | ICD-10-CM | POA: Diagnosis not present

## 2017-12-25 DIAGNOSIS — E785 Hyperlipidemia, unspecified: Secondary | ICD-10-CM | POA: Diagnosis not present

## 2017-12-25 DIAGNOSIS — I872 Venous insufficiency (chronic) (peripheral): Secondary | ICD-10-CM | POA: Diagnosis not present

## 2017-12-25 DIAGNOSIS — M542 Cervicalgia: Secondary | ICD-10-CM | POA: Diagnosis not present

## 2017-12-25 DIAGNOSIS — Z853 Personal history of malignant neoplasm of breast: Secondary | ICD-10-CM | POA: Diagnosis not present

## 2017-12-25 DIAGNOSIS — I10 Essential (primary) hypertension: Secondary | ICD-10-CM | POA: Diagnosis not present

## 2017-12-25 DIAGNOSIS — G3184 Mild cognitive impairment, so stated: Secondary | ICD-10-CM | POA: Diagnosis not present

## 2017-12-26 DIAGNOSIS — M542 Cervicalgia: Secondary | ICD-10-CM | POA: Diagnosis not present

## 2017-12-26 DIAGNOSIS — E559 Vitamin D deficiency, unspecified: Secondary | ICD-10-CM | POA: Diagnosis not present

## 2017-12-26 DIAGNOSIS — I872 Venous insufficiency (chronic) (peripheral): Secondary | ICD-10-CM | POA: Diagnosis not present

## 2017-12-26 DIAGNOSIS — I10 Essential (primary) hypertension: Secondary | ICD-10-CM | POA: Diagnosis not present

## 2017-12-26 DIAGNOSIS — Z853 Personal history of malignant neoplasm of breast: Secondary | ICD-10-CM | POA: Diagnosis not present

## 2017-12-26 DIAGNOSIS — G3184 Mild cognitive impairment, so stated: Secondary | ICD-10-CM | POA: Diagnosis not present

## 2017-12-26 DIAGNOSIS — J309 Allergic rhinitis, unspecified: Secondary | ICD-10-CM | POA: Diagnosis not present

## 2017-12-26 DIAGNOSIS — E785 Hyperlipidemia, unspecified: Secondary | ICD-10-CM | POA: Diagnosis not present

## 2017-12-26 DIAGNOSIS — N3281 Overactive bladder: Secondary | ICD-10-CM | POA: Diagnosis not present

## 2017-12-26 DIAGNOSIS — K219 Gastro-esophageal reflux disease without esophagitis: Secondary | ICD-10-CM | POA: Diagnosis not present

## 2017-12-28 DIAGNOSIS — I872 Venous insufficiency (chronic) (peripheral): Secondary | ICD-10-CM | POA: Diagnosis not present

## 2017-12-28 DIAGNOSIS — Z853 Personal history of malignant neoplasm of breast: Secondary | ICD-10-CM | POA: Diagnosis not present

## 2017-12-28 DIAGNOSIS — E785 Hyperlipidemia, unspecified: Secondary | ICD-10-CM | POA: Diagnosis not present

## 2017-12-28 DIAGNOSIS — N3281 Overactive bladder: Secondary | ICD-10-CM | POA: Diagnosis not present

## 2017-12-28 DIAGNOSIS — K219 Gastro-esophageal reflux disease without esophagitis: Secondary | ICD-10-CM | POA: Diagnosis not present

## 2017-12-28 DIAGNOSIS — E559 Vitamin D deficiency, unspecified: Secondary | ICD-10-CM | POA: Diagnosis not present

## 2017-12-28 DIAGNOSIS — G3184 Mild cognitive impairment, so stated: Secondary | ICD-10-CM | POA: Diagnosis not present

## 2017-12-28 DIAGNOSIS — I10 Essential (primary) hypertension: Secondary | ICD-10-CM | POA: Diagnosis not present

## 2017-12-28 DIAGNOSIS — M542 Cervicalgia: Secondary | ICD-10-CM | POA: Diagnosis not present

## 2017-12-28 DIAGNOSIS — J309 Allergic rhinitis, unspecified: Secondary | ICD-10-CM | POA: Diagnosis not present

## 2017-12-29 ENCOUNTER — Ambulatory Visit
Admission: RE | Admit: 2017-12-29 | Discharge: 2017-12-29 | Disposition: A | Discharge status: Home / Self Care | Payer: Medicare Other | Source: Ambulatory Visit | Attending: Nurse Practitioner | Admitting: Nurse Practitioner

## 2017-12-29 DIAGNOSIS — M810 Age-related osteoporosis without current pathological fracture: Secondary | ICD-10-CM | POA: Diagnosis not present

## 2017-12-29 DIAGNOSIS — E2839 Other primary ovarian failure: Secondary | ICD-10-CM

## 2017-12-29 DIAGNOSIS — Z1239 Encounter for other screening for malignant neoplasm of breast: Secondary | ICD-10-CM

## 2017-12-29 DIAGNOSIS — M85851 Other specified disorders of bone density and structure, right thigh: Secondary | ICD-10-CM | POA: Diagnosis not present

## 2017-12-29 DIAGNOSIS — Z780 Asymptomatic menopausal state: Secondary | ICD-10-CM | POA: Diagnosis not present

## 2017-12-29 DIAGNOSIS — Z1231 Encounter for screening mammogram for malignant neoplasm of breast: Secondary | ICD-10-CM | POA: Diagnosis not present

## 2018-01-02 DIAGNOSIS — G3184 Mild cognitive impairment, so stated: Secondary | ICD-10-CM | POA: Diagnosis not present

## 2018-01-02 DIAGNOSIS — I872 Venous insufficiency (chronic) (peripheral): Secondary | ICD-10-CM | POA: Diagnosis not present

## 2018-01-02 DIAGNOSIS — E785 Hyperlipidemia, unspecified: Secondary | ICD-10-CM | POA: Diagnosis not present

## 2018-01-02 DIAGNOSIS — N3281 Overactive bladder: Secondary | ICD-10-CM | POA: Diagnosis not present

## 2018-01-02 DIAGNOSIS — Z853 Personal history of malignant neoplasm of breast: Secondary | ICD-10-CM | POA: Diagnosis not present

## 2018-01-02 DIAGNOSIS — I10 Essential (primary) hypertension: Secondary | ICD-10-CM | POA: Diagnosis not present

## 2018-01-02 DIAGNOSIS — E559 Vitamin D deficiency, unspecified: Secondary | ICD-10-CM | POA: Diagnosis not present

## 2018-01-02 DIAGNOSIS — K219 Gastro-esophageal reflux disease without esophagitis: Secondary | ICD-10-CM | POA: Diagnosis not present

## 2018-01-02 DIAGNOSIS — J309 Allergic rhinitis, unspecified: Secondary | ICD-10-CM | POA: Diagnosis not present

## 2018-01-02 DIAGNOSIS — M542 Cervicalgia: Secondary | ICD-10-CM | POA: Diagnosis not present

## 2018-01-04 ENCOUNTER — Encounter: Payer: Self-pay | Admitting: Internal Medicine

## 2018-01-04 ENCOUNTER — Ambulatory Visit (INDEPENDENT_AMBULATORY_CARE_PROVIDER_SITE_OTHER): Payer: Medicare Other | Admitting: Internal Medicine

## 2018-01-04 VITALS — BP 158/80 | HR 101 | Temp 98.1°F | Ht 65.0 in | Wt 152.0 lb

## 2018-01-04 DIAGNOSIS — M542 Cervicalgia: Secondary | ICD-10-CM | POA: Diagnosis not present

## 2018-01-04 DIAGNOSIS — R269 Unspecified abnormalities of gait and mobility: Secondary | ICD-10-CM

## 2018-01-04 DIAGNOSIS — N3281 Overactive bladder: Secondary | ICD-10-CM | POA: Diagnosis not present

## 2018-01-04 DIAGNOSIS — I872 Venous insufficiency (chronic) (peripheral): Secondary | ICD-10-CM | POA: Diagnosis not present

## 2018-01-04 DIAGNOSIS — H543 Unqualified visual loss, both eyes: Secondary | ICD-10-CM

## 2018-01-04 DIAGNOSIS — Z853 Personal history of malignant neoplasm of breast: Secondary | ICD-10-CM | POA: Diagnosis not present

## 2018-01-04 DIAGNOSIS — K219 Gastro-esophageal reflux disease without esophagitis: Secondary | ICD-10-CM | POA: Diagnosis not present

## 2018-01-04 DIAGNOSIS — I10 Essential (primary) hypertension: Secondary | ICD-10-CM | POA: Diagnosis not present

## 2018-01-04 DIAGNOSIS — E559 Vitamin D deficiency, unspecified: Secondary | ICD-10-CM | POA: Diagnosis not present

## 2018-01-04 DIAGNOSIS — J309 Allergic rhinitis, unspecified: Secondary | ICD-10-CM | POA: Diagnosis not present

## 2018-01-04 DIAGNOSIS — Z9181 History of falling: Secondary | ICD-10-CM | POA: Diagnosis not present

## 2018-01-04 DIAGNOSIS — G3184 Mild cognitive impairment, so stated: Secondary | ICD-10-CM | POA: Diagnosis not present

## 2018-01-04 DIAGNOSIS — M810 Age-related osteoporosis without current pathological fracture: Secondary | ICD-10-CM

## 2018-01-04 DIAGNOSIS — E785 Hyperlipidemia, unspecified: Secondary | ICD-10-CM | POA: Diagnosis not present

## 2018-01-04 DIAGNOSIS — F0150 Vascular dementia without behavioral disturbance: Secondary | ICD-10-CM | POA: Diagnosis not present

## 2018-01-04 MED ORDER — ALENDRONATE SODIUM 70 MG PO TABS
70.0000 mg | ORAL_TABLET | ORAL | 3 refills | Status: DC
Start: 2018-01-04 — End: 2018-12-05

## 2018-01-04 NOTE — Progress Notes (Signed)
Location:  [facility] clinic Provider:  Doristine Shehan L. Mariea Clonts, D.O., C.M.D.  Code Status: DNR Goals of Care:  Advanced Directives 11/17/2017  Does Patient Have a Medical Advance Directive? Yes  Type of Advance Directive [place]  Does patient want to make changes to medical advance directive? -  Copy of [place] in Chart? Yes  Would patient like information on creating a medical advance directive? -  Pre-existing out of facility DNR order (yellow form or pink MOST form) -     Chief Complaint  Patient presents with  . Medical Management of Chronic Issues    follow up    HPI: Patient is a 84 y.o. female seen today for medical management of chronic diseases.   Says she does not know how she is.  She's hanging in there.  She had trouble with her heart when she had her bone density.  Her heart was racing.  After a few minutes sitting up in a chair and drinking water she felt better.  It has not happened again.  She'd been having a lot of shortness of breath.  Jessica ordered PT and she's been getting that.  They ordered ST b/c her memory has gotten so much worse.  Her vision has declined also.  She needs a f/u.  She's basically blind in her left eye and right eye vision is poor.  Sometimes she can't tell if her food is expired.  Family cleaned out kitchen for her.  She had expired milk and there were ants everywhere before she realized at one point.  She remains wobbly also.  Vicente Males is very nervous she will fall.  She has her life alert button.  She gets aggravated about how hard it use to get up out of the chair--has to push with hands and grab her cane.  She continues her therapy.  Hemorrhoids are bleeding a lot.  Bruises easily.  Still does go to podiatry for toenail trimming.  Gets more bloated.    Discussed need for CNA/caregiver in the home at least for a few hours a day.  Cost is extremely high--was $900 for twice a week.  Granddaughters have been taking shifts  of checking on her weekly.   She is still homebound.  Does not check mail herself anymore.  They've put together the info to apply for the veteran's benefit.    Vicente Males does not have bedrooms on the first floor for patient to stay with her safely.    Past Medical History:  Diagnosis Date  . Anxiety state, unspecified   . Bunion   . Contact dermatitis and other eczema due to other chemical products   . Edema   . External hemorrhoids without mention of complication   . GERD (gastroesophageal reflux disease)   . Hypertension   . Malignant neoplasm of breast (female), unspecified site dx'd 1996   rt breast; xrt   . Pain in joint, pelvic region and thigh   . Rash and other nonspecific skin eruption   . Stroke ([place])   . Unspecified cataract   . Unspecified late effects of cerebrovascular disease   . Viral hepatitis A without mention of hepatic coma   . Viral hepatitis A without mention of hepatic coma   . Vitamin D deficiency     Past Surgical History:  Procedure Laterality Date  . BREAST LUMPECTOMY  1995  . CARDIAC CATHETERIZATION  2004  . CHOLECYSTECTOMY  1972    Allergies  Allergen  Reactions  . Amoxicillin Anaphylaxis and Other (See Comments)    Has patient had a PCN reaction causing immediate rash, facial/tongue/throat swelling, SOB or lightheadedness with hypotension: Yes Has patient had a PCN reaction causing severe rash involving mucus membranes or skin necrosis: No Has patient had a PCN reaction that required hospitalization No Has patient had a PCN reaction occurring within the last 10 years: No If all of the above answers are "NO", then may proceed with Cephalosporin use.  . Angiotensin Receptor Blockers Anaphylaxis, Itching and Rash  . Beta Adrenergic Blockers Anaphylaxis, Itching and Rash  . Cephalexin Anaphylaxis  . Clindamycin/Lincomycin Anaphylaxis, Itching and Rash  . Eggs Or Egg-Derived Products Other (See Comments)    Reaction:  Blisters in mouth   . Tape Other  (See Comments)    Reaction:  Pulls skin off   . Corticosteroids Itching and Rash  . Hydrocortisone Itching and Rash  . Latex Itching and Rash  . Other Itching, Rash and Other (See Comments)    Pt states that she has a pine allergy and she is only able to use fragrance free soaps and laundry products.    . Reglan [Metoclopramide] Itching and Rash    Outpatient Encounter Medications as of 01/04/2018  Medication Sig  . acetaminophen (TYLENOL) 325 MG tablet Take 325 mg by mouth every 6 (six) hours as needed for mild pain or headache.  . alendronate (FOSAMAX) 70 MG tablet Take 1 tablet (70 mg total) by mouth once a week. Take with a full glass of water on an empty stomach.  . cholecalciferol (VITAMIN D) 1000 UNITS tablet Take 2,000 Units by mouth daily.   Marland Kitchen DILT-XR 240 MG 24 hr capsule TAKE 1 CAPSULE(240 MG) BY MOUTH DAILY  . mirabegron ER (MYRBETRIQ) 25 MG TB24 tablet Take 1 tablet (25 mg total) by mouth daily.  . Multiple Vitamin (MULTIVITAMIN WITH MINERALS) TABS tablet Take 1 tablet by mouth daily.  . naproxen (NAPROSYN) 250 MG tablet 1 tablet twice daily for 5 days; may use every 12 hours as needed pain  . [DISCONTINUED] alendronate (FOSAMAX) 70 MG tablet Take 70 mg by mouth once a week. Take with a full glass of water on an empty stomach.   No facility-administered encounter medications on file as of 01/04/2018.     Review of Systems:  Review of Systems  Constitutional: Negative for chills, fever and malaise/fatigue.  HENT: Positive for hearing loss.   Eyes: Positive for blurred vision.  Respiratory: Negative for cough and shortness of breath.        Dyspnea on exertion  Cardiovascular: Positive for palpitations. Negative for chest pain, orthopnea, claudication, leg swelling and PND.  Gastrointestinal: Positive for constipation. Negative for abdominal pain, blood in stool and melena.       Bloating past two months; constipation, family trying to supply healthier meals for her    Genitourinary: Negative for dysuria.  Musculoskeletal: Negative for falls.  Neurological: Positive for weakness. Negative for dizziness, focal weakness and loss of consciousness.  Endo/Heme/Allergies: Bruises/bleeds easily.  Psychiatric/Behavioral: Positive for memory loss. Negative for depression. The patient is nervous/anxious. The patient does not have insomnia.     Health Maintenance  Topic Date Due  . INFLUENZA VACCINE  12/21/2017  . TETANUS/TDAP  01/09/2025  . DEXA SCAN  Completed  . PNA vac Low Risk Adult  Completed    Physical Exam: Vitals:   01/04/18 1151  BP: (!) 158/80  Pulse: (!) 101  Temp: 98.1 F (36.7 C)  TempSrc: Oral  SpO2: 95%  Weight: 152 lb (68.9 kg)  Height: 5\' 5"  (1.651 m)   Body mass index is 25.29 kg/m. Physical Exam  Constitutional: She appears well-developed and well-nourished. No distress.  HENT:  Head: Normocephalic and atraumatic.  Eyes:  Glasses; legally blind left eye, poor vision right  Cardiovascular: Normal rate, regular rhythm, normal heart sounds and intact distal pulses.  HR normalized apically  Pulmonary/Chest: Effort normal and breath sounds normal. No respiratory distress.  Abdominal: Soft. Bowel sounds are normal. She exhibits distension. She exhibits no mass. There is no tenderness. There is no rebound and no guarding.  Musculoskeletal: Normal range of motion.  Has to use both arms to get up out of chair, walks with cane   Neurological: She is alert.  Oriented to person, place, not time; did not know to dial 911 in case of emergency; does know to push her life alert button and get out of house if fire  Skin: Skin is warm and dry.  Psychiatric: She has a normal mood and affect.    Labs reviewed: Basic Metabolic Panel: Recent Labs    05/19/17 1044 11/17/17 1200  NA 140 138  K 4.3 3.9  CL 104 102  CO2 26 27  GLUCOSE 102 96  BUN 9 12  CREATININE 0.76 0.67  CALCIUM 9.6 10.1   Liver Function Tests: Recent Labs     05/19/17 1044 11/17/17 1200  AST 17 18  ALT 14 14  BILITOT 0.5 0.5  PROT 6.9 7.5   No results for input(s): LIPASE, AMYLASE in the last 8760 hours. No results for input(s): AMMONIA in the last 8760 hours. CBC: Recent Labs    11/17/17 1200  WBC 5.9  NEUTROABS 3,912  HGB 13.8  HCT 40.3  MCV 93.3  PLT 210   Lipid Panel: No results for input(s): CHOL, HDL, LDLCALC, TRIG, CHOLHDL, LDLDIRECT in the last 8760 hours. Lab Results  Component Value Date   HGBA1C 5.9 (H) 05/19/2017    Procedures since last visit: Dexascan  Result Date: 12/29/2017 EXAM: DUAL X-RAY ABSORPTIOMETRY (DXA) FOR BONE MINERAL DENSITY IMPRESSION: Referring Physician:  Titus Dubin Your patient completed a BMD test using Lunar IDXA DXA system ( analysis version: 16 ) manufactured by [employer]. PATIENT: Name: Nessa, Ramaker Patient ID:  [id number] Birth Date: Oct 15, 1928 Height:     63.0 in.     Weight:     148.2 lbs. Sex:         Female Measured:   12/29/2017 Indications: Advanced Age, Caucasian, Estrogen Deficient, Height Loss (781.91), Postmenopausal Fractures: None Treatments: Calcium (E943.0), Vitamin D (E933.5) ASSESSMENT: The BMD measured at AP Spine L1-L3 is 0.861 g/cm2 with a T-score of -2.6. This patient is considered OSTEOPOROTIC according to [facility]) criteria. The scan quality is limited by patient limited mobility. L-4 was excluded due to degenerative changes. Site Region Measured Date Measured Age YA T-score BMD Significant CHANGE AP Spine  L1-L3      14/29/2017    87.1         -2.6    0.861 g/cm2 DualFemur Neck Right 12/29/2017    87.1         -1.8    0.794 g/cm2 DualFemur Total Mean 12/29/2017    87.1         -1.5    0.817 g/cm2 World Health Organization [facility]) criteria for post-menopausal, Caucasian Women: Normal       T-score at or above -1  SD Osteopenia   T-score between -1 and -2.5 SD Osteoporosis T-score at or below -2.5 SD RECOMMENDATION: 1. All patients should optimize calcium  and vitamin D intake. 2. Consider FDA approved medical therapies in postmenopausal women and men aged 85 years and older, based on the following: a. A hip or vertebral (clinical or morphometric) fracture b. T- score < or = -2.5 at the femoral neck or spine after appropriate evaluation to exclude secondary causes c. Low bone mass (T-score between -1.0 and -2.5 at the femoral neck or spine) and a 10 year probability of a hip fracture > or = 3% or a 10 year probability of a major osteoporosis-related fracture > or = 20% based on the [place]-adapted WHO algorithm d. Clinician judgment and/or patient preferences may indicate treatment for people with 10-year fracture probabilities above or below these levels FOLLOW-UP: People with diagnosed cases of osteoporosis or at high risk for fracture should have regular bone mineral density tests. For patients eligible for Medicare, routine testing is allowed once every 2 years. The testing frequency can be increased to one year for patients who have rapidly progressing disease, those who are receiving or discontinuing medical therapy to restore bone mass, or have additional risk factors. I have reviewed this report and agree with the above findings. Mark A. Thornton Papas, M.D. [facility] Radiology Electronically Signed   By: Lavonia Dana M.D.   On: 12/29/2017 11:51   Mm 3d Screen Breast Bilateral  Result Date: 12/29/2017 CLINICAL DATA:  Screening. RIGHT lumpectomy 1999. EXAM: DIGITAL SCREENING BILATERAL MAMMOGRAM WITH TOMO AND CAD COMPARISON:  Previous exam(s). ACR Breast Density Category c: The breast tissue is heterogeneously dense, which may obscure small masses. FINDINGS: There are no findings suspicious for malignancy. Post operative changes are seen in the RIGHTbreast. Images were processed with CAD. IMPRESSION: No mammographic evidence of malignancy. A result letter of this screening mammogram will be mailed directly to the patient. RECOMMENDATION: Screening mammogram in one year.  (Code:SM-B-01Y) BI-RADS CATEGORY  1: Negative. Electronically Signed   By: Nolon Nations M.D.   On: 12/29/2017 08:58    Assessment/Plan 1. Vascular dementia without behavioral disturbance -progressing, very poor short term memory at this point -ideally should have 24x7 care or AL due to combo of this, her low vision and her gait disorder -encouraged adult day health program, [employer] bus use for appts (often missed when granddaaughter out of town), CNA assist at home--they've applied for [employer] benefits  -cont life alert button and current home health -family taking shifts coming to see her and clean out her friidge  2. Senile osteoporosis - continue vitamin D and agrees to try fosamax weekly -family finally getting her pillboxes--talked about pillpack service also today - alendronate (FOSAMAX) 70 MG tablet; Take 1 tablet (70 mg total) by mouth once a week. Take with a full glass of water on an empty stomach.  Dispense: 12 tablet; Refill: 3  3. OAB (overactive bladder) -continue myrbetriq which has helped  4. Gait disorder -ongoing, working with PT -has life alert button and knows to use it  5. Essential hypertension, benign -bp and HR elevated here--is clearly anxious but calms down during visit and has not allowed bp meds to be changed due to sensitivities to meds  6. Visual loss, bilateral -worsening, is seeing ophtho now finally, but struggling at home due to this also  Labs/tests ordered:  No orders of the defined types were placed in this encounter.   Next appt:  4 mos for  med mgt but they left w/o making an appt  Stevin Bielinski L. Yader Criger, D.O. [facility] [address], [place] [address] Cell Phone (Mon-Fri 8am-5pm):  [phone] On Call:  [phone] & follow prompts after 5pm & weekends Office Phone:  [phone] Office Fax:  [phone]

## 2018-01-05 DIAGNOSIS — I872 Venous insufficiency (chronic) (peripheral): Secondary | ICD-10-CM | POA: Diagnosis not present

## 2018-01-05 DIAGNOSIS — N3281 Overactive bladder: Secondary | ICD-10-CM | POA: Diagnosis not present

## 2018-01-05 DIAGNOSIS — K219 Gastro-esophageal reflux disease without esophagitis: Secondary | ICD-10-CM | POA: Diagnosis not present

## 2018-01-05 DIAGNOSIS — J309 Allergic rhinitis, unspecified: Secondary | ICD-10-CM | POA: Diagnosis not present

## 2018-01-05 DIAGNOSIS — M542 Cervicalgia: Secondary | ICD-10-CM | POA: Diagnosis not present

## 2018-01-05 DIAGNOSIS — E785 Hyperlipidemia, unspecified: Secondary | ICD-10-CM | POA: Diagnosis not present

## 2018-01-05 DIAGNOSIS — I10 Essential (primary) hypertension: Secondary | ICD-10-CM | POA: Diagnosis not present

## 2018-01-05 DIAGNOSIS — E559 Vitamin D deficiency, unspecified: Secondary | ICD-10-CM | POA: Diagnosis not present

## 2018-01-05 DIAGNOSIS — G3184 Mild cognitive impairment, so stated: Secondary | ICD-10-CM | POA: Diagnosis not present

## 2018-01-05 DIAGNOSIS — Z853 Personal history of malignant neoplasm of breast: Secondary | ICD-10-CM | POA: Diagnosis not present

## 2018-01-05 DIAGNOSIS — Z9181 History of falling: Secondary | ICD-10-CM | POA: Diagnosis not present

## 2018-01-09 ENCOUNTER — Telehealth: Payer: Self-pay | Admitting: *Deleted

## 2018-01-09 DIAGNOSIS — M542 Cervicalgia: Secondary | ICD-10-CM | POA: Diagnosis not present

## 2018-01-09 DIAGNOSIS — Z853 Personal history of malignant neoplasm of breast: Secondary | ICD-10-CM | POA: Diagnosis not present

## 2018-01-09 DIAGNOSIS — I10 Essential (primary) hypertension: Secondary | ICD-10-CM | POA: Diagnosis not present

## 2018-01-09 DIAGNOSIS — G3184 Mild cognitive impairment, so stated: Secondary | ICD-10-CM | POA: Diagnosis not present

## 2018-01-09 DIAGNOSIS — E785 Hyperlipidemia, unspecified: Secondary | ICD-10-CM | POA: Diagnosis not present

## 2018-01-09 DIAGNOSIS — E559 Vitamin D deficiency, unspecified: Secondary | ICD-10-CM | POA: Diagnosis not present

## 2018-01-09 DIAGNOSIS — I872 Venous insufficiency (chronic) (peripheral): Secondary | ICD-10-CM | POA: Diagnosis not present

## 2018-01-09 DIAGNOSIS — N3281 Overactive bladder: Secondary | ICD-10-CM | POA: Diagnosis not present

## 2018-01-09 DIAGNOSIS — J309 Allergic rhinitis, unspecified: Secondary | ICD-10-CM | POA: Diagnosis not present

## 2018-01-09 DIAGNOSIS — Z9181 History of falling: Secondary | ICD-10-CM | POA: Diagnosis not present

## 2018-01-09 DIAGNOSIS — K219 Gastro-esophageal reflux disease without esophagitis: Secondary | ICD-10-CM | POA: Diagnosis not present

## 2018-01-09 NOTE — Telephone Encounter (Signed)
Speech therapist calling stating there is a small interaction or duplicate therapy for Fosamax and the preservision for her eyes. She wanted to know if it was ok to take these medications together.

## 2018-01-10 DIAGNOSIS — G3184 Mild cognitive impairment, so stated: Secondary | ICD-10-CM | POA: Diagnosis not present

## 2018-01-10 DIAGNOSIS — K219 Gastro-esophageal reflux disease without esophagitis: Secondary | ICD-10-CM | POA: Diagnosis not present

## 2018-01-10 DIAGNOSIS — I10 Essential (primary) hypertension: Secondary | ICD-10-CM | POA: Diagnosis not present

## 2018-01-10 DIAGNOSIS — I872 Venous insufficiency (chronic) (peripheral): Secondary | ICD-10-CM | POA: Diagnosis not present

## 2018-01-10 DIAGNOSIS — J309 Allergic rhinitis, unspecified: Secondary | ICD-10-CM | POA: Diagnosis not present

## 2018-01-10 DIAGNOSIS — Z853 Personal history of malignant neoplasm of breast: Secondary | ICD-10-CM | POA: Diagnosis not present

## 2018-01-10 DIAGNOSIS — E785 Hyperlipidemia, unspecified: Secondary | ICD-10-CM | POA: Diagnosis not present

## 2018-01-10 DIAGNOSIS — N3281 Overactive bladder: Secondary | ICD-10-CM | POA: Diagnosis not present

## 2018-01-10 DIAGNOSIS — M542 Cervicalgia: Secondary | ICD-10-CM | POA: Diagnosis not present

## 2018-01-10 DIAGNOSIS — E559 Vitamin D deficiency, unspecified: Secondary | ICD-10-CM | POA: Diagnosis not present

## 2018-01-11 DIAGNOSIS — J309 Allergic rhinitis, unspecified: Secondary | ICD-10-CM | POA: Diagnosis not present

## 2018-01-11 DIAGNOSIS — G3184 Mild cognitive impairment, so stated: Secondary | ICD-10-CM | POA: Diagnosis not present

## 2018-01-11 DIAGNOSIS — Z853 Personal history of malignant neoplasm of breast: Secondary | ICD-10-CM | POA: Diagnosis not present

## 2018-01-11 DIAGNOSIS — E785 Hyperlipidemia, unspecified: Secondary | ICD-10-CM | POA: Diagnosis not present

## 2018-01-11 DIAGNOSIS — M542 Cervicalgia: Secondary | ICD-10-CM | POA: Diagnosis not present

## 2018-01-11 DIAGNOSIS — K219 Gastro-esophageal reflux disease without esophagitis: Secondary | ICD-10-CM | POA: Diagnosis not present

## 2018-01-11 DIAGNOSIS — I10 Essential (primary) hypertension: Secondary | ICD-10-CM | POA: Diagnosis not present

## 2018-01-11 DIAGNOSIS — N3281 Overactive bladder: Secondary | ICD-10-CM | POA: Diagnosis not present

## 2018-01-11 DIAGNOSIS — I872 Venous insufficiency (chronic) (peripheral): Secondary | ICD-10-CM | POA: Diagnosis not present

## 2018-01-11 DIAGNOSIS — E559 Vitamin D deficiency, unspecified: Secondary | ICD-10-CM | POA: Diagnosis not present

## 2018-01-15 DIAGNOSIS — J309 Allergic rhinitis, unspecified: Secondary | ICD-10-CM | POA: Diagnosis not present

## 2018-01-15 DIAGNOSIS — K219 Gastro-esophageal reflux disease without esophagitis: Secondary | ICD-10-CM | POA: Diagnosis not present

## 2018-01-15 DIAGNOSIS — I872 Venous insufficiency (chronic) (peripheral): Secondary | ICD-10-CM | POA: Diagnosis not present

## 2018-01-15 DIAGNOSIS — M542 Cervicalgia: Secondary | ICD-10-CM | POA: Diagnosis not present

## 2018-01-15 DIAGNOSIS — E785 Hyperlipidemia, unspecified: Secondary | ICD-10-CM | POA: Diagnosis not present

## 2018-01-15 DIAGNOSIS — Z853 Personal history of malignant neoplasm of breast: Secondary | ICD-10-CM | POA: Diagnosis not present

## 2018-01-15 DIAGNOSIS — G3184 Mild cognitive impairment, so stated: Secondary | ICD-10-CM | POA: Diagnosis not present

## 2018-01-15 DIAGNOSIS — N3281 Overactive bladder: Secondary | ICD-10-CM | POA: Diagnosis not present

## 2018-01-15 DIAGNOSIS — I10 Essential (primary) hypertension: Secondary | ICD-10-CM | POA: Diagnosis not present

## 2018-01-15 DIAGNOSIS — Z9181 History of falling: Secondary | ICD-10-CM | POA: Diagnosis not present

## 2018-01-15 DIAGNOSIS — E559 Vitamin D deficiency, unspecified: Secondary | ICD-10-CM | POA: Diagnosis not present

## 2018-01-16 DIAGNOSIS — I10 Essential (primary) hypertension: Secondary | ICD-10-CM | POA: Diagnosis not present

## 2018-01-16 DIAGNOSIS — J309 Allergic rhinitis, unspecified: Secondary | ICD-10-CM | POA: Diagnosis not present

## 2018-01-16 DIAGNOSIS — N3281 Overactive bladder: Secondary | ICD-10-CM | POA: Diagnosis not present

## 2018-01-16 DIAGNOSIS — M542 Cervicalgia: Secondary | ICD-10-CM | POA: Diagnosis not present

## 2018-01-16 DIAGNOSIS — Z853 Personal history of malignant neoplasm of breast: Secondary | ICD-10-CM | POA: Diagnosis not present

## 2018-01-16 DIAGNOSIS — E559 Vitamin D deficiency, unspecified: Secondary | ICD-10-CM | POA: Diagnosis not present

## 2018-01-16 DIAGNOSIS — E785 Hyperlipidemia, unspecified: Secondary | ICD-10-CM | POA: Diagnosis not present

## 2018-01-16 DIAGNOSIS — I872 Venous insufficiency (chronic) (peripheral): Secondary | ICD-10-CM | POA: Diagnosis not present

## 2018-01-16 DIAGNOSIS — K219 Gastro-esophageal reflux disease without esophagitis: Secondary | ICD-10-CM | POA: Diagnosis not present

## 2018-01-16 DIAGNOSIS — G3184 Mild cognitive impairment, so stated: Secondary | ICD-10-CM | POA: Diagnosis not present

## 2018-01-18 DIAGNOSIS — E785 Hyperlipidemia, unspecified: Secondary | ICD-10-CM | POA: Diagnosis not present

## 2018-01-18 DIAGNOSIS — I872 Venous insufficiency (chronic) (peripheral): Secondary | ICD-10-CM | POA: Diagnosis not present

## 2018-01-18 DIAGNOSIS — K219 Gastro-esophageal reflux disease without esophagitis: Secondary | ICD-10-CM | POA: Diagnosis not present

## 2018-01-18 DIAGNOSIS — G3184 Mild cognitive impairment, so stated: Secondary | ICD-10-CM | POA: Diagnosis not present

## 2018-01-18 DIAGNOSIS — M542 Cervicalgia: Secondary | ICD-10-CM | POA: Diagnosis not present

## 2018-01-18 DIAGNOSIS — I10 Essential (primary) hypertension: Secondary | ICD-10-CM | POA: Diagnosis not present

## 2018-01-18 DIAGNOSIS — Z9181 History of falling: Secondary | ICD-10-CM | POA: Diagnosis not present

## 2018-01-18 DIAGNOSIS — Z853 Personal history of malignant neoplasm of breast: Secondary | ICD-10-CM | POA: Diagnosis not present

## 2018-01-18 DIAGNOSIS — E559 Vitamin D deficiency, unspecified: Secondary | ICD-10-CM | POA: Diagnosis not present

## 2018-01-18 DIAGNOSIS — J309 Allergic rhinitis, unspecified: Secondary | ICD-10-CM | POA: Diagnosis not present

## 2018-01-18 DIAGNOSIS — N3281 Overactive bladder: Secondary | ICD-10-CM | POA: Diagnosis not present

## 2018-01-19 DIAGNOSIS — Z9181 History of falling: Secondary | ICD-10-CM | POA: Diagnosis not present

## 2018-01-19 DIAGNOSIS — I10 Essential (primary) hypertension: Secondary | ICD-10-CM | POA: Diagnosis not present

## 2018-01-19 DIAGNOSIS — N3281 Overactive bladder: Secondary | ICD-10-CM | POA: Diagnosis not present

## 2018-01-19 DIAGNOSIS — K219 Gastro-esophageal reflux disease without esophagitis: Secondary | ICD-10-CM | POA: Diagnosis not present

## 2018-01-19 DIAGNOSIS — J309 Allergic rhinitis, unspecified: Secondary | ICD-10-CM | POA: Diagnosis not present

## 2018-01-19 DIAGNOSIS — E559 Vitamin D deficiency, unspecified: Secondary | ICD-10-CM | POA: Diagnosis not present

## 2018-01-19 DIAGNOSIS — G3184 Mild cognitive impairment, so stated: Secondary | ICD-10-CM | POA: Diagnosis not present

## 2018-01-19 DIAGNOSIS — E785 Hyperlipidemia, unspecified: Secondary | ICD-10-CM | POA: Diagnosis not present

## 2018-01-19 DIAGNOSIS — I872 Venous insufficiency (chronic) (peripheral): Secondary | ICD-10-CM | POA: Diagnosis not present

## 2018-01-19 DIAGNOSIS — Z853 Personal history of malignant neoplasm of breast: Secondary | ICD-10-CM | POA: Diagnosis not present

## 2018-01-19 DIAGNOSIS — M542 Cervicalgia: Secondary | ICD-10-CM | POA: Diagnosis not present

## 2018-01-23 ENCOUNTER — Ambulatory Visit: Payer: Medicare Other | Admitting: Podiatry

## 2018-01-23 ENCOUNTER — Encounter: Payer: Self-pay | Admitting: Podiatry

## 2018-01-23 DIAGNOSIS — J309 Allergic rhinitis, unspecified: Secondary | ICD-10-CM | POA: Diagnosis not present

## 2018-01-23 DIAGNOSIS — G3184 Mild cognitive impairment, so stated: Secondary | ICD-10-CM | POA: Diagnosis not present

## 2018-01-23 DIAGNOSIS — M79676 Pain in unspecified toe(s): Secondary | ICD-10-CM | POA: Diagnosis not present

## 2018-01-23 DIAGNOSIS — K219 Gastro-esophageal reflux disease without esophagitis: Secondary | ICD-10-CM | POA: Diagnosis not present

## 2018-01-23 DIAGNOSIS — I872 Venous insufficiency (chronic) (peripheral): Secondary | ICD-10-CM | POA: Diagnosis not present

## 2018-01-23 DIAGNOSIS — E785 Hyperlipidemia, unspecified: Secondary | ICD-10-CM | POA: Diagnosis not present

## 2018-01-23 DIAGNOSIS — E559 Vitamin D deficiency, unspecified: Secondary | ICD-10-CM | POA: Diagnosis not present

## 2018-01-23 DIAGNOSIS — B351 Tinea unguium: Secondary | ICD-10-CM

## 2018-01-23 DIAGNOSIS — M542 Cervicalgia: Secondary | ICD-10-CM | POA: Diagnosis not present

## 2018-01-23 DIAGNOSIS — Z853 Personal history of malignant neoplasm of breast: Secondary | ICD-10-CM | POA: Diagnosis not present

## 2018-01-23 DIAGNOSIS — N3281 Overactive bladder: Secondary | ICD-10-CM | POA: Diagnosis not present

## 2018-01-23 DIAGNOSIS — I10 Essential (primary) hypertension: Secondary | ICD-10-CM | POA: Diagnosis not present

## 2018-01-23 NOTE — Progress Notes (Signed)
Complaint:  Visit Type: Patient returns to my office for continued preventative foot care services. Complaint: Patient states" my nails have grown long and thick and become painful to walk and wear shoes" . The patient presents for preventative foot care services. No changes to ROS  Podiatric Exam: Vascular: dorsalis pedis and posterior tibial pulses are palpable bilateral. Capillary return is immediate. Temperature gradient is WNL. Skin turgor WNL  Sensorium: Normal Semmes Weinstein monofilament test. Normal tactile sensation bilaterally. Nail Exam: Pt has thick disfigured discolored nails with subungual debris noted bilateral entire nail hallux through fifth toenails Ulcer Exam: There is no evidence of ulcer or pre-ulcerative changes or infection. Orthopedic Exam: Muscle tone and strength are WNL. No limitations in general ROM. No crepitus or effusions noted. Foot type and digits show no abnormalities. Bony prominences are unremarkable. Skin: No Porokeratosis. No infection or ulcers  Diagnosis:  Onychomycosis, , Pain in right toe, pain in left toes  Treatment & Plan Procedures and Treatment: Consent by patient was obtained for treatment procedures.   Debridement of mycotic and hypertrophic toenails, 1 through 5 bilateral and clearing of subungual debris. No ulceration, no infection noted.  Return Visit-Office Procedure: Patient instructed to return to the office for a follow up visit 3 months for continued evaluation and treatment.    Emer Onnen DPM 

## 2018-01-24 DIAGNOSIS — I872 Venous insufficiency (chronic) (peripheral): Secondary | ICD-10-CM | POA: Diagnosis not present

## 2018-01-24 DIAGNOSIS — E559 Vitamin D deficiency, unspecified: Secondary | ICD-10-CM | POA: Diagnosis not present

## 2018-01-24 DIAGNOSIS — E785 Hyperlipidemia, unspecified: Secondary | ICD-10-CM | POA: Diagnosis not present

## 2018-01-24 DIAGNOSIS — Z853 Personal history of malignant neoplasm of breast: Secondary | ICD-10-CM | POA: Diagnosis not present

## 2018-01-24 DIAGNOSIS — G3184 Mild cognitive impairment, so stated: Secondary | ICD-10-CM | POA: Diagnosis not present

## 2018-01-24 DIAGNOSIS — K219 Gastro-esophageal reflux disease without esophagitis: Secondary | ICD-10-CM | POA: Diagnosis not present

## 2018-01-24 DIAGNOSIS — J309 Allergic rhinitis, unspecified: Secondary | ICD-10-CM | POA: Diagnosis not present

## 2018-01-24 DIAGNOSIS — M542 Cervicalgia: Secondary | ICD-10-CM | POA: Diagnosis not present

## 2018-01-24 DIAGNOSIS — I10 Essential (primary) hypertension: Secondary | ICD-10-CM | POA: Diagnosis not present

## 2018-01-24 DIAGNOSIS — N3281 Overactive bladder: Secondary | ICD-10-CM | POA: Diagnosis not present

## 2018-01-25 DIAGNOSIS — E559 Vitamin D deficiency, unspecified: Secondary | ICD-10-CM | POA: Diagnosis not present

## 2018-01-25 DIAGNOSIS — E785 Hyperlipidemia, unspecified: Secondary | ICD-10-CM | POA: Diagnosis not present

## 2018-01-25 DIAGNOSIS — M542 Cervicalgia: Secondary | ICD-10-CM | POA: Diagnosis not present

## 2018-01-25 DIAGNOSIS — Z853 Personal history of malignant neoplasm of breast: Secondary | ICD-10-CM | POA: Diagnosis not present

## 2018-01-25 DIAGNOSIS — I10 Essential (primary) hypertension: Secondary | ICD-10-CM | POA: Diagnosis not present

## 2018-01-25 DIAGNOSIS — N3281 Overactive bladder: Secondary | ICD-10-CM | POA: Diagnosis not present

## 2018-01-25 DIAGNOSIS — G3184 Mild cognitive impairment, so stated: Secondary | ICD-10-CM | POA: Diagnosis not present

## 2018-01-25 DIAGNOSIS — I872 Venous insufficiency (chronic) (peripheral): Secondary | ICD-10-CM | POA: Diagnosis not present

## 2018-01-25 DIAGNOSIS — K219 Gastro-esophageal reflux disease without esophagitis: Secondary | ICD-10-CM | POA: Diagnosis not present

## 2018-01-25 DIAGNOSIS — J309 Allergic rhinitis, unspecified: Secondary | ICD-10-CM | POA: Diagnosis not present

## 2018-01-27 DIAGNOSIS — M542 Cervicalgia: Secondary | ICD-10-CM | POA: Diagnosis not present

## 2018-01-27 DIAGNOSIS — N3281 Overactive bladder: Secondary | ICD-10-CM | POA: Diagnosis not present

## 2018-01-27 DIAGNOSIS — I10 Essential (primary) hypertension: Secondary | ICD-10-CM | POA: Diagnosis not present

## 2018-01-27 DIAGNOSIS — K219 Gastro-esophageal reflux disease without esophagitis: Secondary | ICD-10-CM

## 2018-01-27 DIAGNOSIS — G3184 Mild cognitive impairment, so stated: Secondary | ICD-10-CM | POA: Diagnosis not present

## 2018-01-30 DIAGNOSIS — K219 Gastro-esophageal reflux disease without esophagitis: Secondary | ICD-10-CM | POA: Diagnosis not present

## 2018-01-30 DIAGNOSIS — E559 Vitamin D deficiency, unspecified: Secondary | ICD-10-CM | POA: Diagnosis not present

## 2018-01-30 DIAGNOSIS — Z853 Personal history of malignant neoplasm of breast: Secondary | ICD-10-CM | POA: Diagnosis not present

## 2018-01-30 DIAGNOSIS — E785 Hyperlipidemia, unspecified: Secondary | ICD-10-CM | POA: Diagnosis not present

## 2018-01-30 DIAGNOSIS — I10 Essential (primary) hypertension: Secondary | ICD-10-CM | POA: Diagnosis not present

## 2018-01-30 DIAGNOSIS — N3281 Overactive bladder: Secondary | ICD-10-CM | POA: Diagnosis not present

## 2018-01-30 DIAGNOSIS — Z9181 History of falling: Secondary | ICD-10-CM | POA: Diagnosis not present

## 2018-01-30 DIAGNOSIS — I872 Venous insufficiency (chronic) (peripheral): Secondary | ICD-10-CM | POA: Diagnosis not present

## 2018-01-30 DIAGNOSIS — G3184 Mild cognitive impairment, so stated: Secondary | ICD-10-CM | POA: Diagnosis not present

## 2018-01-30 DIAGNOSIS — M542 Cervicalgia: Secondary | ICD-10-CM | POA: Diagnosis not present

## 2018-02-01 DIAGNOSIS — M542 Cervicalgia: Secondary | ICD-10-CM | POA: Diagnosis not present

## 2018-02-01 DIAGNOSIS — Z853 Personal history of malignant neoplasm of breast: Secondary | ICD-10-CM | POA: Diagnosis not present

## 2018-02-01 DIAGNOSIS — K219 Gastro-esophageal reflux disease without esophagitis: Secondary | ICD-10-CM | POA: Diagnosis not present

## 2018-02-01 DIAGNOSIS — G3184 Mild cognitive impairment, so stated: Secondary | ICD-10-CM | POA: Diagnosis not present

## 2018-02-01 DIAGNOSIS — E559 Vitamin D deficiency, unspecified: Secondary | ICD-10-CM | POA: Diagnosis not present

## 2018-02-01 DIAGNOSIS — N3281 Overactive bladder: Secondary | ICD-10-CM | POA: Diagnosis not present

## 2018-02-01 DIAGNOSIS — I10 Essential (primary) hypertension: Secondary | ICD-10-CM | POA: Diagnosis not present

## 2018-02-01 DIAGNOSIS — I872 Venous insufficiency (chronic) (peripheral): Secondary | ICD-10-CM | POA: Diagnosis not present

## 2018-02-01 DIAGNOSIS — E785 Hyperlipidemia, unspecified: Secondary | ICD-10-CM | POA: Diagnosis not present

## 2018-02-01 DIAGNOSIS — Z9181 History of falling: Secondary | ICD-10-CM | POA: Diagnosis not present

## 2018-02-06 DIAGNOSIS — E785 Hyperlipidemia, unspecified: Secondary | ICD-10-CM | POA: Diagnosis not present

## 2018-02-06 DIAGNOSIS — M542 Cervicalgia: Secondary | ICD-10-CM | POA: Diagnosis not present

## 2018-02-06 DIAGNOSIS — G3184 Mild cognitive impairment, so stated: Secondary | ICD-10-CM | POA: Diagnosis not present

## 2018-02-06 DIAGNOSIS — I872 Venous insufficiency (chronic) (peripheral): Secondary | ICD-10-CM | POA: Diagnosis not present

## 2018-02-06 DIAGNOSIS — N3281 Overactive bladder: Secondary | ICD-10-CM | POA: Diagnosis not present

## 2018-02-06 DIAGNOSIS — Z9181 History of falling: Secondary | ICD-10-CM | POA: Diagnosis not present

## 2018-02-06 DIAGNOSIS — E559 Vitamin D deficiency, unspecified: Secondary | ICD-10-CM | POA: Diagnosis not present

## 2018-02-06 DIAGNOSIS — Z853 Personal history of malignant neoplasm of breast: Secondary | ICD-10-CM | POA: Diagnosis not present

## 2018-02-06 DIAGNOSIS — I10 Essential (primary) hypertension: Secondary | ICD-10-CM | POA: Diagnosis not present

## 2018-02-06 DIAGNOSIS — K219 Gastro-esophageal reflux disease without esophagitis: Secondary | ICD-10-CM | POA: Diagnosis not present

## 2018-02-08 DIAGNOSIS — Z9181 History of falling: Secondary | ICD-10-CM | POA: Diagnosis not present

## 2018-02-08 DIAGNOSIS — Z853 Personal history of malignant neoplasm of breast: Secondary | ICD-10-CM | POA: Diagnosis not present

## 2018-02-08 DIAGNOSIS — G3184 Mild cognitive impairment, so stated: Secondary | ICD-10-CM | POA: Diagnosis not present

## 2018-02-08 DIAGNOSIS — M542 Cervicalgia: Secondary | ICD-10-CM | POA: Diagnosis not present

## 2018-02-08 DIAGNOSIS — N3281 Overactive bladder: Secondary | ICD-10-CM | POA: Diagnosis not present

## 2018-02-08 DIAGNOSIS — I872 Venous insufficiency (chronic) (peripheral): Secondary | ICD-10-CM | POA: Diagnosis not present

## 2018-02-08 DIAGNOSIS — E785 Hyperlipidemia, unspecified: Secondary | ICD-10-CM | POA: Diagnosis not present

## 2018-02-08 DIAGNOSIS — E559 Vitamin D deficiency, unspecified: Secondary | ICD-10-CM | POA: Diagnosis not present

## 2018-02-08 DIAGNOSIS — I10 Essential (primary) hypertension: Secondary | ICD-10-CM | POA: Diagnosis not present

## 2018-02-08 DIAGNOSIS — K219 Gastro-esophageal reflux disease without esophagitis: Secondary | ICD-10-CM | POA: Diagnosis not present

## 2018-02-13 DIAGNOSIS — I872 Venous insufficiency (chronic) (peripheral): Secondary | ICD-10-CM | POA: Diagnosis not present

## 2018-02-13 DIAGNOSIS — E785 Hyperlipidemia, unspecified: Secondary | ICD-10-CM | POA: Diagnosis not present

## 2018-02-13 DIAGNOSIS — E559 Vitamin D deficiency, unspecified: Secondary | ICD-10-CM | POA: Diagnosis not present

## 2018-02-13 DIAGNOSIS — N3281 Overactive bladder: Secondary | ICD-10-CM | POA: Diagnosis not present

## 2018-02-13 DIAGNOSIS — Z9181 History of falling: Secondary | ICD-10-CM | POA: Diagnosis not present

## 2018-02-13 DIAGNOSIS — Z853 Personal history of malignant neoplasm of breast: Secondary | ICD-10-CM | POA: Diagnosis not present

## 2018-02-13 DIAGNOSIS — G3184 Mild cognitive impairment, so stated: Secondary | ICD-10-CM | POA: Diagnosis not present

## 2018-02-13 DIAGNOSIS — K219 Gastro-esophageal reflux disease without esophagitis: Secondary | ICD-10-CM | POA: Diagnosis not present

## 2018-02-13 DIAGNOSIS — I10 Essential (primary) hypertension: Secondary | ICD-10-CM | POA: Diagnosis not present

## 2018-02-13 DIAGNOSIS — M542 Cervicalgia: Secondary | ICD-10-CM | POA: Diagnosis not present

## 2018-02-15 DIAGNOSIS — Z9181 History of falling: Secondary | ICD-10-CM | POA: Diagnosis not present

## 2018-02-15 DIAGNOSIS — N3281 Overactive bladder: Secondary | ICD-10-CM | POA: Diagnosis not present

## 2018-02-15 DIAGNOSIS — K219 Gastro-esophageal reflux disease without esophagitis: Secondary | ICD-10-CM | POA: Diagnosis not present

## 2018-02-15 DIAGNOSIS — E785 Hyperlipidemia, unspecified: Secondary | ICD-10-CM | POA: Diagnosis not present

## 2018-02-15 DIAGNOSIS — I872 Venous insufficiency (chronic) (peripheral): Secondary | ICD-10-CM | POA: Diagnosis not present

## 2018-02-15 DIAGNOSIS — M542 Cervicalgia: Secondary | ICD-10-CM | POA: Diagnosis not present

## 2018-02-15 DIAGNOSIS — I10 Essential (primary) hypertension: Secondary | ICD-10-CM | POA: Diagnosis not present

## 2018-02-15 DIAGNOSIS — Z853 Personal history of malignant neoplasm of breast: Secondary | ICD-10-CM | POA: Diagnosis not present

## 2018-02-15 DIAGNOSIS — G3184 Mild cognitive impairment, so stated: Secondary | ICD-10-CM | POA: Diagnosis not present

## 2018-02-15 DIAGNOSIS — E559 Vitamin D deficiency, unspecified: Secondary | ICD-10-CM | POA: Diagnosis not present

## 2018-02-20 DIAGNOSIS — E785 Hyperlipidemia, unspecified: Secondary | ICD-10-CM | POA: Diagnosis not present

## 2018-02-20 DIAGNOSIS — N3281 Overactive bladder: Secondary | ICD-10-CM | POA: Diagnosis not present

## 2018-02-20 DIAGNOSIS — I872 Venous insufficiency (chronic) (peripheral): Secondary | ICD-10-CM | POA: Diagnosis not present

## 2018-02-20 DIAGNOSIS — Z9181 History of falling: Secondary | ICD-10-CM | POA: Diagnosis not present

## 2018-02-20 DIAGNOSIS — K219 Gastro-esophageal reflux disease without esophagitis: Secondary | ICD-10-CM | POA: Diagnosis not present

## 2018-02-20 DIAGNOSIS — G3184 Mild cognitive impairment, so stated: Secondary | ICD-10-CM | POA: Diagnosis not present

## 2018-02-20 DIAGNOSIS — M542 Cervicalgia: Secondary | ICD-10-CM | POA: Diagnosis not present

## 2018-02-20 DIAGNOSIS — I10 Essential (primary) hypertension: Secondary | ICD-10-CM | POA: Diagnosis not present

## 2018-02-20 DIAGNOSIS — Z853 Personal history of malignant neoplasm of breast: Secondary | ICD-10-CM | POA: Diagnosis not present

## 2018-02-20 DIAGNOSIS — E559 Vitamin D deficiency, unspecified: Secondary | ICD-10-CM | POA: Diagnosis not present

## 2018-02-21 DIAGNOSIS — I10 Essential (primary) hypertension: Secondary | ICD-10-CM | POA: Diagnosis not present

## 2018-02-21 DIAGNOSIS — E559 Vitamin D deficiency, unspecified: Secondary | ICD-10-CM | POA: Diagnosis not present

## 2018-02-21 DIAGNOSIS — G3184 Mild cognitive impairment, so stated: Secondary | ICD-10-CM | POA: Diagnosis not present

## 2018-02-21 DIAGNOSIS — N3281 Overactive bladder: Secondary | ICD-10-CM | POA: Diagnosis not present

## 2018-02-21 DIAGNOSIS — K219 Gastro-esophageal reflux disease without esophagitis: Secondary | ICD-10-CM | POA: Diagnosis not present

## 2018-02-21 DIAGNOSIS — E785 Hyperlipidemia, unspecified: Secondary | ICD-10-CM | POA: Diagnosis not present

## 2018-02-21 DIAGNOSIS — Z853 Personal history of malignant neoplasm of breast: Secondary | ICD-10-CM | POA: Diagnosis not present

## 2018-02-21 DIAGNOSIS — I872 Venous insufficiency (chronic) (peripheral): Secondary | ICD-10-CM | POA: Diagnosis not present

## 2018-02-21 DIAGNOSIS — M542 Cervicalgia: Secondary | ICD-10-CM | POA: Diagnosis not present

## 2018-02-21 DIAGNOSIS — Z9181 History of falling: Secondary | ICD-10-CM | POA: Diagnosis not present

## 2018-03-12 ENCOUNTER — Other Ambulatory Visit: Payer: Self-pay | Admitting: Internal Medicine

## 2018-03-12 DIAGNOSIS — I10 Essential (primary) hypertension: Secondary | ICD-10-CM

## 2018-04-24 ENCOUNTER — Ambulatory Visit: Payer: Medicare Other | Admitting: Podiatry

## 2018-06-08 ENCOUNTER — Other Ambulatory Visit: Payer: Self-pay | Admitting: Internal Medicine

## 2018-06-08 DIAGNOSIS — I10 Essential (primary) hypertension: Secondary | ICD-10-CM

## 2018-06-11 ENCOUNTER — Ambulatory Visit: Payer: Medicare Other | Admitting: Podiatry

## 2018-06-11 ENCOUNTER — Encounter: Payer: Self-pay | Admitting: Podiatry

## 2018-06-11 DIAGNOSIS — B351 Tinea unguium: Secondary | ICD-10-CM | POA: Diagnosis not present

## 2018-06-11 DIAGNOSIS — M79676 Pain in unspecified toe(s): Secondary | ICD-10-CM

## 2018-06-11 NOTE — Patient Instructions (Signed)

## 2018-06-11 NOTE — Progress Notes (Signed)
Subjective: Beth Foster presents today with painful, thick toenails 1-5 b/l that she cannot cut and which interfere with daily activities.  Pain is aggravated when wearing enclosed shoe gear and relieved with periodic debridement.  She is accompanied by her grandson-in-law on today's visit who states due to other issues she is a little overdue for her nail care.  Reed, Tiffany L, DO  Is her PCP and last DOS was 01/04/2018.   Current Outpatient Medications:  .  acetaminophen (TYLENOL) 325 MG tablet, Take 325 mg by mouth every 6 (six) hours as needed for mild pain or headache., Disp: , Rfl:  .  alendronate (FOSAMAX) 70 MG tablet, Take 1 tablet (70 mg total) by mouth once a week. Take with a full glass of water on an empty stomach., Disp: 12 tablet, Rfl: 3 .  cholecalciferol (VITAMIN D) 1000 UNITS tablet, Take 2,000 Units by mouth daily. , Disp: , Rfl:  .  DILT-XR 240 MG 24 hr capsule, TAKE 1 CAPSULE(240 MG) BY MOUTH DAILY, Disp: 90 capsule, Rfl: 0 .  mirabegron ER (MYRBETRIQ) 25 MG TB24 tablet, Take 1 tablet (25 mg total) by mouth daily., Disp: 90 tablet, Rfl: 1 .  Multiple Vitamin (MULTIVITAMIN WITH MINERALS) TABS tablet, Take 1 tablet by mouth daily., Disp: , Rfl:  .  naproxen (NAPROSYN) 250 MG tablet, 1 tablet twice daily for 5 days; may use every 12 hours as needed pain, Disp: 30 tablet, Rfl: 0  Allergies  Allergen Reactions  . Amoxicillin Anaphylaxis and Other (See Comments)    Has patient had a PCN reaction causing immediate rash, facial/tongue/throat swelling, SOB or lightheadedness with hypotension: Yes Has patient had a PCN reaction causing severe rash involving mucus membranes or skin necrosis: No Has patient had a PCN reaction that required hospitalization No Has patient had a PCN reaction occurring within the last 10 years: No If all of the above answers are "NO", then may proceed with Cephalosporin use.  . Angiotensin Receptor Blockers Anaphylaxis, Itching and Rash  . Beta  Adrenergic Blockers Anaphylaxis, Itching and Rash  . Cephalexin Anaphylaxis  . Clindamycin/Lincomycin Anaphylaxis, Itching and Rash  . Eggs Or Egg-Derived Products Other (See Comments)    Reaction:  Blisters in mouth   . Tape Other (See Comments)    Reaction:  Pulls skin off   . Corticosteroids Itching and Rash  . Hydrocortisone Itching and Rash  . Latex Itching and Rash  . Other Itching, Rash and Other (See Comments)    Pt states that she has a pine allergy and she is only able to use fragrance free soaps and laundry products.    . Reglan [Metoclopramide] Itching and Rash    Objective:  Vascular Examination: Capillary refill time immediate x 10 digits  Dorsalis pedis and Posterior tibial pulses palpable b/l  Digital hair present x 10 digits  Skin temperature gradient WNL b/l  Dermatological Examination: Skin with normal turgor, texture and tone b/l  Toenails 1-5 b/l discolored, thick, dystrophic with subungual debris and pain with palpation to nailbeds due to thickness of nails. Incurvated nail plates b/l great toes b/l borders. No erythema, no edema, no drainage. Right 2nd digit noted to be about 1/2 inch thick with tenderness to palpation. No erythema, no edema, no drainage.  Musculoskeletal: Muscle strength 5/5 to all LE muscle groups  No gross bony deformities b/l.  No pain, crepitus or joint limitation noted with ROM.   Neurological: Sensation intact with 10 gram monofilament.  Vibratory sensation intact.  Assessment: Painful onychomycosis toenails 1-5 b/l   Plan: 1. Toenails 1-5 b/l were debrided in length and girth without iatrogenic bleeding. 2. Patient to continue soft, supportive shoe gear 3. Patient to report any pedal injuries to medical professional immediately. 4. Follow up 3 months.  5. Patient/POA to call should there be a concern in the interim.

## 2018-06-12 ENCOUNTER — Ambulatory Visit: Payer: Medicare Other | Admitting: Podiatry

## 2018-07-09 ENCOUNTER — Ambulatory Visit (INDEPENDENT_AMBULATORY_CARE_PROVIDER_SITE_OTHER): Payer: Medicare Other | Admitting: Nurse Practitioner

## 2018-07-09 ENCOUNTER — Encounter: Payer: Self-pay | Admitting: Nurse Practitioner

## 2018-07-09 VITALS — BP 142/80 | HR 92 | Temp 97.8°F | Ht 65.0 in | Wt 151.8 lb

## 2018-07-09 DIAGNOSIS — M25512 Pain in left shoulder: Secondary | ICD-10-CM | POA: Diagnosis not present

## 2018-07-09 MED ORDER — NAPROXEN 250 MG PO TABS: ORAL_TABLET | ORAL | 0 refills | Status: DC

## 2018-07-09 NOTE — Progress Notes (Signed)
Careteam: Patient Care Team: Gayland Curry, DO as PCP - General (Geriatric Medicine)  Advanced Directive information Does Patient Have a Medical Advance Directive?: Yes, Type of Advance Directive: [place], Does patient want to make changes to medical advance directive?: No - Patient declined  Allergies  Allergen Reactions  . Amoxicillin Anaphylaxis and Other (See Comments)    Has patient had a PCN reaction causing immediate rash, facial/tongue/throat swelling, SOB or lightheadedness with hypotension: Yes Has patient had a PCN reaction causing severe rash involving mucus membranes or skin necrosis: No Has patient had a PCN reaction that required hospitalization No Has patient had a PCN reaction occurring within the last 10 years: No If all of the above answers are "NO", then may proceed with Cephalosporin use.  . Angiotensin Receptor Blockers Anaphylaxis, Itching and Rash  . Beta Adrenergic Blockers Anaphylaxis, Itching and Rash  . Cephalexin Anaphylaxis  . Clindamycin/Lincomycin Anaphylaxis, Itching and Rash  . Eggs Or Egg-Derived Products Other (See Comments)    Reaction:  Blisters in mouth   . Tape Other (See Comments)    Reaction:  Pulls skin off   . Corticosteroids Itching and Rash  . Hydrocortisone Itching and Rash  . Latex Itching and Rash  . Other Itching, Rash and Other (See Comments)    Pt states that she has a pine allergy and she is only able to use fragrance free soaps and laundry products.    . Reglan [Metoclopramide] Itching and Rash    Chief Complaint  Patient presents with  . Acute Visit    left arm and shoulder pain for 2 months     HPI: Patient is a 85 y.o. female seen in the office today due to left arm and shoulder pain.  Pt with worsening dementia.  Here with grandson-in-law He reports she has been doing a lot of cleaning at the house. No fall.  Having a hard time changing cloths.  Using rubbing alcohol to rub on for pain.    Has taken tylenol which was effective.  Unable to raise her arm up.  Reports pain has been going on from 2 weeks-2 months.  Pain is not constant but when she moves it or needs to get dressed.  Pain 8/10.     Review of Systems:  Review of Systems  Constitutional: Negative for chills and fever.  Musculoskeletal: Positive for joint pain and myalgias (left shoulder pain). Negative for back pain, falls and neck pain.  Psychiatric/Behavioral: Positive for memory loss.    Past Medical History:  Diagnosis Date  . Anxiety state, unspecified   . Bunion   . Contact dermatitis and other eczema due to other chemical products   . Edema   . External hemorrhoids without mention of complication   . GERD (gastroesophageal reflux disease)   . Hypertension   . Malignant neoplasm of breast (female), unspecified site dx'd 1996   rt breast; xrt   . Pain in joint, pelvic region and thigh   . Rash and other nonspecific skin eruption   . Stroke ([place])   . Unspecified cataract   . Unspecified late effects of cerebrovascular disease   . Viral hepatitis A without mention of hepatic coma   . Viral hepatitis A without mention of hepatic coma   . Vitamin D deficiency    Past Surgical History:  Procedure Laterality Date  . BREAST LUMPECTOMY  1995  . CARDIAC CATHETERIZATION  2004  . CHOLECYSTECTOMY  1972  Social History:   reports that she has never smoked. She has never used smokeless tobacco. She reports that she does not drink alcohol or use drugs.  Family History  Problem Relation Age of Onset  . Cancer Mother        lung  . Cancer Brother        brain  . Varicose Veins Father   . Varicose Veins Sister     Medications: Patient's Medications  New Prescriptions   No medications on file  Previous Medications   ACETAMINOPHEN (TYLENOL) 325 MG TABLET    Take 325 mg by mouth every 6 (six) hours as needed for mild pain or headache.   ALENDRONATE (FOSAMAX) 70 MG TABLET    Take 1 tablet (70 mg  total) by mouth once a week. Take with a full glass of water on an empty stomach.   CHOLECALCIFEROL (VITAMIN D) 1000 UNITS TABLET    Take 2,000 Units by mouth daily.    DILT-XR 240 MG 24 HR CAPSULE    TAKE 1 CAPSULE(240 MG) BY MOUTH DAILY   MIRABEGRON ER (MYRBETRIQ) 25 MG TB24 TABLET    Take 1 tablet (25 mg total) by mouth daily.   MULTIPLE VITAMIN (MULTIVITAMIN WITH MINERALS) TABS TABLET    Take 1 tablet by mouth daily.  Modified Medications   No medications on file  Discontinued Medications   NAPROXEN (NAPROSYN) 250 MG TABLET    1 tablet twice daily for 5 days; may use every 12 hours as needed pain     Physical Exam:  Vitals:   07/09/18 1143  BP: (!) 152/88  Pulse: 92  Temp: 97.8 F (36.6 C)  TempSrc: Oral  SpO2: 95%  Weight: 151 lb 12.8 oz (68.9 kg)  Height: 5\' 5"  (1.651 m)   Body mass index is 25.26 kg/m.  Physical Exam Constitutional:      Appearance: Normal appearance.  Cardiovascular:     Rate and Rhythm: Normal rate and regular rhythm.  Pulmonary:     Effort: Pulmonary effort is normal.     Breath sounds: Normal breath sounds.  Musculoskeletal:     Left shoulder: She exhibits tenderness (to left upper arm ). She exhibits normal range of motion, no bony tenderness and no swelling.  Neurological:     Mental Status: She is alert.     Labs reviewed: Basic Metabolic Panel: Recent Labs    11/17/17 1200  NA 138  K 3.9  CL 102  CO2 27  GLUCOSE 96  BUN 12  CREATININE 0.67  CALCIUM 10.1   Liver Function Tests: Recent Labs    11/17/17 1200  AST 18  ALT 14  BILITOT 0.5  PROT 7.5   No results for input(s): LIPASE, AMYLASE in the last 8760 hours. No results for input(s): AMMONIA in the last 8760 hours. CBC: Recent Labs    11/17/17 1200  WBC 5.9  NEUTROABS 3,912  HGB 13.8  HCT 40.3  MCV 93.3  PLT 210   Lipid Panel: No results for input(s): CHOL, HDL, LDLCALC, TRIG, CHOLHDL, LDLDIRECT in the last 8760 hours. TSH: No results for input(s): TSH  in the last 8760 hours. A1C: Lab Results  Component Value Date   HGBA1C 5.9 (H) 05/19/2017     Assessment/Plan 1. Acute pain of left shoulder ?bursitis due to overuse with chores. Encouraged to rest shoulder but not immobilize it.  To use ice 2-3 times daily with muscle rub after ice - naproxen (NAPROSYN) 250 MG tablet; 1  tablet twice daily for 5 days; may use every 12 hours as needed pain  Dispense: 30 tablet; Refill: 0 -to notify if symptoms worsen or fail to improve. Also notify for swelling, redness or worsening pain.  -consider PT if symptoms not improving with above measures.   Carlos American. [place], [facility] [phone]

## 2018-07-09 NOTE — Patient Instructions (Addendum)
To use naproxen 250 mg by mouth twice daily for 5 days then every 12 hours as needed for pain  To ice shoulder 2-3 times daily Apply muscle rub AFTER ICE Follow up if worse or no improvement after 5 days   Bursitis  Bursitis is inflammation and irritation of a bursa, which is one of the small, fluid-filled sacs that cushion and protect the moving parts of your body. These sacs are located between bones and muscles, bones and muscle attachments, or bones and skin areas that are next to bones. A bursa protects those structures from the wear and tear that results from frequent movement. An inflamed bursa causes pain and swelling. Fluid may build up inside the sac. Bursitis is most common near joints, especially the knees, elbows, hips, and shoulders. What are the causes? This condition may be caused by:  Injury from: ? A direct hit (blow), like falling on your knee or elbow. ? Overuse of a joint (repetitive stress).  Infection. This can happen if bacteria get into a bursa through a cut or scrape near a joint.  Diseases that cause joint inflammation, such as gout and rheumatoid arthritis. What increases the risk? You are more likely to develop this condition if you:  Have a job or hobby that involves a lot of repetitive stress on your joints.  Have a condition that weakens your body's defense system (immune system), such as diabetes, cancer, or HIV.  Do any of these often: ? Lift and reach overhead. ? Kneel or lean on hard surfaces. ? Run or walk. What are the signs or symptoms? The most common symptoms of this condition include:  Pain that gets worse when you move the affected body part or use it to support (bear) your body weight.  Inflammation.  Stiffness. Other symptoms include:  Redness.  Swelling.  Tenderness.  Warmth.  Pain that continues after rest.  Fever or chills. These may occur in bursitis that is caused by infection. How is this diagnosed? This  condition may be diagnosed based on:  Your medical history and a physical exam.  Imaging tests, such as an MRI.  A procedure to drain fluid from the bursa with a needle (aspiration). The fluid may be checked for signs of infection or gout.  Blood tests to rule out other causes of inflammation. How is this treated? This condition can usually be treated at home with rest, ice, applying pressure (compression), and raising the body part that is affected (elevation). This is called RICE therapy. For mild bursitis, RICE therapy may be all you need. Other treatments may include:  NSAIDs to treat pain and inflammation.  Corticosteroid medicines to fight inflammation. These medicines may be injected into and around the area of bursitis.  Aspiration of fluid from the bursa to relieve pain and improve movement.  Antibiotic medicine to treat an infected bursa.  A splint, brace, or walking aid, such as a cane.  Physical therapy if you continue to have pain or limited movement.  Surgery to remove a damaged or infected bursa. This may be needed if other treatments have not worked. Follow these instructions at home: Medicines  Take over-the-counter and prescription medicines only as told by your health care provider.  If you were prescribed an antibiotic medicine, take it as told by your health care provider. Do not stop taking the antibiotic even if you start to feel better. General instructions   Rest the affected area as told by your health care provider. ?  If possible, raise (elevate) the affected area above the level of your heart while you are sitting or lying down. ? Avoid activities that make pain worse.  Use splints, braces, pads, or walking aids as told by your health care provider.  If directed, put ice on the affected area: ? If you have a removable splint or brace, remove it as told by your health care provider. ? Put ice in a plastic bag. ? Place a towel between your skin and  the bag or between your splint or brace and the bag. ? Leave the ice on for 20 minutes, 2-3 times a day.  Keep all follow-up visits as told by your health care provider. This is important. Preventing future episodes Take actions to help prevent future episodes of bursitis.  Wear knee pads if you kneel often.  Wear sturdy running or walking shoes that fit you well.  Take breaks regularly from repetitive activity.  Warm up by stretching before doing any activity that takes a lot of effort.  Maintain a healthy weight or lose weight as recommended by your health care provider. If you need help doing this, ask your health care provider.  Exercise regularly. Start any new physical activity gradually. Contact a health care provider if you:  Have a fever.  Have chills.  Have bursitis that is not getting better with treatment or home care. Summary  Bursitis is inflammation and irritation of a bursa, which is one of the small, fluid-filled sacs that cushion and protect the moving parts of your body.  An inflamed bursa causes pain and swelling.  Bursitis is commonly diagnosed with a physical exam, but other tests are sometimes needed.  This condition can usually be treated at home with rest, ice, applying pressure (compression), and raising the body part that is affected (elevation). This is called RICE therapy. This information is not intended to replace advice given to you by your health care provider. Make sure you discuss any questions you have with your health care provider. Document Released: 05/06/2000 Document Revised: 03/24/2017 Document Reviewed: 03/24/2017 Elsevier Interactive Patient Education  2019 [employer].

## 2018-07-16 ENCOUNTER — Encounter: Payer: Self-pay | Admitting: Internal Medicine

## 2018-07-23 ENCOUNTER — Other Ambulatory Visit: Payer: Self-pay | Admitting: Nurse Practitioner

## 2018-07-23 DIAGNOSIS — M25512 Pain in left shoulder: Secondary | ICD-10-CM

## 2018-08-12 ENCOUNTER — Other Ambulatory Visit: Payer: Self-pay | Admitting: Internal Medicine

## 2018-08-12 DIAGNOSIS — M25512 Pain in left shoulder: Secondary | ICD-10-CM

## 2018-08-30 ENCOUNTER — Telehealth: Payer: Self-pay | Admitting: *Deleted

## 2018-08-30 MED ORDER — CIPROFLOXACIN HCL 500 MG PO TABS: 500.0000 mg | ORAL_TABLET | Freq: Two times a day (BID) | ORAL | 0 refills | Status: DC

## 2018-08-30 MED ORDER — NYSTATIN 100000 UNIT/GM EX POWD: Freq: Two times a day (BID) | CUTANEOUS | 0 refills | Status: DC

## 2018-08-30 NOTE — Telephone Encounter (Signed)
LMOM for Beth Foster to [facility]

## 2018-08-30 NOTE — Telephone Encounter (Signed)
Daughter stated that she is on Lock Down in her home because Granddaughter's husband is a Print production planner and exposed, so they do not go around her.

## 2018-08-30 NOTE — Telephone Encounter (Signed)
Let's treat her for presumed UTI at this point:  cipro 500mg  po bid for 7 days.  She should eat yogurt if possible while taking this and drink plenty of water (8 8oz glasses per day).  She may also drink cranberry juice.  Let's also send in nystatin powder apply bid beneath breast for likely yeast skin infection.  I recommend they put all of these items in a bag with cleaned hands, wearing masks and drop off to [place], but avoid going into her home or other contact.  If they want, we may be able to send her prescriptions to a local pharmacy that delivers ([place] example)

## 2018-08-30 NOTE — Telephone Encounter (Signed)
Granddaughter, Vicente Males [facility]) called and stated that patient is complaining of:  1) burning with urination and has blood.    2) Also complaining of irritation under her breast. Stated that it is not blisters, looks like her bra has been rubbing. Is using Cortisone Cream with some relief.   Patient called c/o possible UrinaryTract Infection (UTI)  1. What symptoms are you having (frequency, urgency, dysuria, incontinence, confusion)? Burning with urination and blood   2. Any fever or chills? No Fever,No Chills  3. Any suprapubic pain? No pelvic/abdominal pain  4. Have you taken anything OTC for symptoms? None  5. How much water are you drinking daily? Not drinking as much as she should  6. How long have you had your symptoms (onset)? 3-4 days  I will forward this information to your provider and call with instructions, if your symptoms persist or progress seek medical attention at your nearest urgent care or emergency room. Patient verbalized understanding.

## 2018-08-30 NOTE — Telephone Encounter (Signed)
Granddaughter notified and agreed. Rx's sent to pharmacy.

## 2018-09-06 ENCOUNTER — Other Ambulatory Visit: Payer: Self-pay | Admitting: Internal Medicine

## 2018-09-06 DIAGNOSIS — I10 Essential (primary) hypertension: Secondary | ICD-10-CM

## 2018-09-11 ENCOUNTER — Ambulatory Visit: Payer: Medicare Other | Admitting: Podiatry

## 2018-09-25 ENCOUNTER — Telehealth: Payer: Self-pay | Admitting: *Deleted

## 2018-09-25 ENCOUNTER — Other Ambulatory Visit: Payer: Self-pay

## 2018-09-25 DIAGNOSIS — R300 Dysuria: Secondary | ICD-10-CM

## 2018-09-25 DIAGNOSIS — N39498 Other specified urinary incontinence: Secondary | ICD-10-CM

## 2018-09-25 NOTE — Telephone Encounter (Signed)
Beth Foster, Granddaughter called and stated that patient finished her Antibiotic and stated that it helped but the UTI is back. Stated that patient is very uncomfortable and having pelvic pain.  She wonders if patient would benefit from a 2nd round of antibiotic or if she should take AZO. Please Advise.   Patient called c/o possible UrinaryTract Infection (UTI)  1. What symptoms are you having (frequency, urgency, dysuria, incontinence, confusion)? Incontinence more severe, burning and pain when urinates  2. Any fever or chills? No fever. But does have a blanket on her at all times  3. Any suprapubic pain? yes  4. Have you taken anything OTC for symptoms? Finished Cipro. Takes Tylenol.   5. How much water are you drinking daily? Drinks about 1/2 of what Dr. Mariea Clonts wanted her to.   6. How long have you had your symptoms (onset)? 4 days  I will forward this information to your provider and call with instructions, if your symptoms persist or progress seek medical attention at your nearest urgent care or emergency room. Patient verbalized understanding.

## 2018-09-25 NOTE — Telephone Encounter (Signed)
Would it be possible this time to get a urine sample?  Someone could pick up the cup and hat for her and she'd collect it at home?  I hate to keep giving her more antibiotics when we don't know if what I chose is the best on for the bacteria she has in her urine.

## 2018-09-25 NOTE — Telephone Encounter (Signed)
Vicente Males notified and agreed and will pick up a cup. Left up front. Order placed.

## 2018-09-27 LAB — URINALYSIS
Bilirubin Urine: NEGATIVE
Glucose, UA: NEGATIVE
Ketones, ur: NEGATIVE
Nitrite: NEGATIVE
Specific Gravity, Urine: 1.014 (ref 1.001–1.03)
pH: 6.5 (ref 5.0–8.0)

## 2018-09-27 LAB — URINE CULTURE
MICRO NUMBER:: 447071
Result:: NO GROWTH
SPECIMEN QUALITY:: ADEQUATE

## 2018-10-01 ENCOUNTER — Telehealth: Payer: Self-pay | Admitting: *Deleted

## 2018-10-01 MED ORDER — CIPROFLOXACIN HCL 500 MG PO TABS: 500.0000 mg | ORAL_TABLET | Freq: Two times a day (BID) | ORAL | 0 refills | Status: DC

## 2018-10-01 NOTE — Telephone Encounter (Signed)
-----   Message from Gayland Curry, DO sent at 09/27/2018  5:28 PM EDT ----- Start keflex 500mg  po bid for 7 days.  Eat yogurt daily.  Drink 3 bottles of water per day. If not better after that, she needs a pelvic exam here in the office.

## 2018-10-04 ENCOUNTER — Other Ambulatory Visit: Payer: Self-pay

## 2018-10-04 ENCOUNTER — Encounter: Payer: Self-pay | Admitting: Internal Medicine

## 2018-10-04 ENCOUNTER — Ambulatory Visit (INDEPENDENT_AMBULATORY_CARE_PROVIDER_SITE_OTHER): Payer: Medicare Other | Admitting: Internal Medicine

## 2018-10-04 VITALS — BP 140/70 | HR 80 | Temp 98.0°F | Ht 65.0 in | Wt 149.0 lb

## 2018-10-04 DIAGNOSIS — R300 Dysuria: Secondary | ICD-10-CM

## 2018-10-04 DIAGNOSIS — N3945 Continuous leakage: Secondary | ICD-10-CM

## 2018-10-04 DIAGNOSIS — M79661 Pain in right lower leg: Secondary | ICD-10-CM

## 2018-10-04 DIAGNOSIS — N952 Postmenopausal atrophic vaginitis: Secondary | ICD-10-CM | POA: Diagnosis not present

## 2018-10-04 DIAGNOSIS — F0150 Vascular dementia without behavioral disturbance: Secondary | ICD-10-CM

## 2018-10-04 DIAGNOSIS — M7989 Other specified soft tissue disorders: Secondary | ICD-10-CM

## 2018-10-04 LAB — POCT URINALYSIS DIPSTICK
Bilirubin, UA: NEGATIVE
Glucose, UA: NEGATIVE
Ketones, UA: NEGATIVE
Nitrite, UA: NEGATIVE
Protein, UA: POSITIVE — AB
Spec Grav, UA: <=1.005 — AB (ref 1.010–1.025)
Urobilinogen, UA: NEGATIVE E.U./dL — AB
pH, UA: 6 (ref 5.0–8.0)

## 2018-10-04 MED ORDER — ESTRADIOL 0.1 MG/GM VA CREA: 1.0000 | TOPICAL_CREAM | VAGINAL | 12 refills | Status: DC

## 2018-10-04 NOTE — Progress Notes (Signed)
Location:  [facility] clinic Provider: Liani Caris L. Mariea Clonts, D.O., C.M.D.  Code Status: DNR Goals of Care:  Advanced Directives 07/09/2018  Does Patient Have a Medical Advance Directive? Yes  Type of Advance Directive [place]  Does patient want to make changes to medical advance directive? No - Patient declined  Copy of [place] in Chart? -  Would patient like information on creating a medical advance directive? -  Pre-existing out of facility DNR order (yellow form or pink MOST form) -   Chief Complaint  Patient presents with  . Acute Visit    right leg swollen, possible UTI    HPI: Patient is a 85 y.o. female with h/o dementia, breast cancer, recurrent uti seen today for an acute visit for right leg pain and swelling x 2 wks and urinary frequency, urgency, dysuria for at least 4 wks.  She was initially treated with cipro with only transient improvement. Then her symptoms returned fully.  She says she has gone to the bathroom to urinate every 15 minutes.  It burns.  She only drinks two coffee cups of water per day.   The right leg has been red, swollen, and warm.  It hurts her to touch it.  She's barely able to get her right shoe on and has had to go back to her mary janes from her sneakers due to the advanced swollen.  [facility] is with her (her granddaughter).  She reports that [facility]'s vision has gotten considerably worse.  She also has some hallucinations/delusions like someone had stolen an item from her porch which was not true.  She also has had more difficulty with her memory.  She is not eating well unless her granddaughter brings her meals.  She is only down 2 lbs since feb but this is considering the swelling of her right leg.  She is quite weak right now and needed help getting in and out of the car.     Past Medical History:  Diagnosis Date  . Anxiety state, unspecified   . Bunion   . Contact dermatitis and other eczema due to other chemical  products   . Edema   . External hemorrhoids without mention of complication   . GERD (gastroesophageal reflux disease)   . Hypertension   . Malignant neoplasm of breast (female), unspecified site dx'd 1996   rt breast; xrt   . Pain in joint, pelvic region and thigh   . Rash and other nonspecific skin eruption   . Stroke ([place])   . Unspecified cataract   . Unspecified late effects of cerebrovascular disease   . Viral hepatitis A without mention of hepatic coma   . Viral hepatitis A without mention of hepatic coma   . Vitamin D deficiency     Past Surgical History:  Procedure Laterality Date  . BREAST LUMPECTOMY  1995  . CARDIAC CATHETERIZATION  2004  . CHOLECYSTECTOMY  1972    Allergies  Allergen Reactions  . Amoxicillin Anaphylaxis and Other (See Comments)    Has patient had a PCN reaction causing immediate rash, facial/tongue/throat swelling, SOB or lightheadedness with hypotension: Yes Has patient had a PCN reaction causing severe rash involving mucus membranes or skin necrosis: No Has patient had a PCN reaction that required hospitalization No Has patient had a PCN reaction occurring within the last 10 years: No If all of the above answers are "NO", then may proceed with Cephalosporin use.  . Angiotensin Receptor Blockers Anaphylaxis, Itching  and Rash  . Beta Adrenergic Blockers Anaphylaxis, Itching and Rash  . Cephalexin Anaphylaxis  . Clindamycin/Lincomycin Anaphylaxis, Itching and Rash  . Eggs Or Egg-Derived Products Other (See Comments)    Reaction:  Blisters in mouth   . Tape Other (See Comments)    Reaction:  Pulls skin off   . Corticosteroids Itching and Rash  . Hydrocortisone Itching and Rash  . Latex Itching and Rash  . Other Itching, Rash and Other (See Comments)    Pt states that she has a pine allergy and she is only able to use fragrance free soaps and laundry products.    . Reglan [Metoclopramide] Itching and Rash    Outpatient Encounter Medications as  of 10/04/2018  Medication Sig  . acetaminophen (TYLENOL) 325 MG tablet Take 325 mg by mouth every 6 (six) hours as needed for mild pain or headache.  . alendronate (FOSAMAX) 70 MG tablet Take 1 tablet (70 mg total) by mouth once a week. Take with a full glass of water on an empty stomach.  . cephALEXin (KEFLEX) 500 MG capsule Take 500 mg by mouth 2 (two) times daily.  . cholecalciferol (VITAMIN D) 1000 UNITS tablet Take 2,000 Units by mouth daily.   . ciprofloxacin (CIPRO) 500 MG tablet Take 1 tablet (500 mg total) by mouth 2 (two) times daily. For 7 days  . DILT-XR 240 MG 24 hr capsule TAKE 1 CAPSULE(240 MG) BY MOUTH DAILY  . mirabegron ER (MYRBETRIQ) 25 MG TB24 tablet Take 1 tablet (25 mg total) by mouth daily.  . Multiple Vitamin (MULTIVITAMIN WITH MINERALS) TABS tablet Take 1 tablet by mouth daily.  . naproxen (NAPROSYN) 250 MG tablet TAKE 1 TABLET BY MOUTH TWICE DAILY FOR 5 DAYS. MAY USE EVERY 12 HOURS AS NEEDED PAIN  . nystatin (MYCOSTATIN/NYSTOP) powder Apply topically 2 (two) times daily. Underneath breasts   No facility-administered encounter medications on file as of 10/04/2018.     Review of Systems:  Review of Systems  Constitutional: Positive for malaise/fatigue and weight loss. Negative for chills and fever.  HENT: Positive for hearing loss.   Eyes: Positive for blurred vision.  Respiratory: Negative for cough and shortness of breath.   Cardiovascular: Positive for leg swelling. Negative for chest pain and palpitations.       Right leg only  Gastrointestinal: Positive for constipation. Negative for abdominal pain and diarrhea.       Had been constipated but lasagna she ate "cleaned her out" this morning; hemorrhoids present  Genitourinary: Positive for dysuria, frequency and urgency. Negative for flank pain and hematuria.  Musculoskeletal: Negative for falls.  Skin: Negative for itching and rash.  Neurological: Positive for weakness. Negative for dizziness and loss of  consciousness.  Psychiatric/Behavioral: Positive for hallucinations and memory loss. Negative for depression. The patient is not nervous/anxious and does not have insomnia.     Health Maintenance  Topic Date Due  . INFLUENZA VACCINE  12/22/2018  . TETANUS/TDAP  01/09/2025  . DEXA SCAN  Completed  . PNA vac Low Risk Adult  Completed    Physical Exam: Vitals:   10/04/18 1344  BP: 140/70  Pulse: 80  Temp: 98 F (36.7 C)  TempSrc: Oral  SpO2: 98%  Weight: 149 lb (67.6 kg)  Height: 5\' 5"  (1.651 m)   Body mass index is 24.79 kg/m. Physical Exam Vitals signs reviewed. Exam conducted with a chaperone present.  Constitutional:      Appearance: Normal appearance.  HENT:  Head: Normocephalic and atraumatic.  Cardiovascular:     Pulses: Normal pulses.  Pulmonary:     Effort: Pulmonary effort is normal.  Abdominal:     General: Bowel sounds are normal. There is no distension.     Palpations: Abdomen is soft.     Tenderness: There is no abdominal tenderness. There is no right CVA tenderness, left CVA tenderness, guarding or rebound.  Genitourinary:    General: Normal vulva.     Vagina: No vaginal discharge.     Comments: Erythema of labia and entire peri-area; no discharge; dryness of vaginal area, as well; very large hemorrhoids around rectum Musculoskeletal:     Right lower leg: Edema present.     Left lower leg: No edema.     Comments: Weak and needed some help with socks and shoes and transfers today  Skin:    General: Skin is warm and dry.     Coloration: Skin is pale.  Neurological:     General: No focal deficit present.     Mental Status: She is alert.  Psychiatric:        Mood and Affect: Mood normal.     Labs reviewed: Basic Metabolic Panel: Recent Labs    11/17/17 1200  NA 138  K 3.9  CL 102  CO2 27  GLUCOSE 96  BUN 12  CREATININE 0.67  CALCIUM 10.1   Liver Function Tests: Recent Labs    11/17/17 1200  AST 18  ALT 14  BILITOT 0.5  PROT  7.5   No results for input(s): LIPASE, AMYLASE in the last 8760 hours. No results for input(s): AMMONIA in the last 8760 hours. CBC: Recent Labs    11/17/17 1200  WBC 5.9  NEUTROABS 3,912  HGB 13.8  HCT 40.3  MCV 93.3  PLT 210   Lipid Panel: No results for input(s): CHOL, HDL, LDLCALC, TRIG, CHOLHDL, LDLDIRECT in the last 8760 hours. Lab Results  Component Value Date   HGBA1C 5.9 (H) 05/19/2017    Procedures since last visit: No results found.  Assessment/Plan 1. Continuous leakage of urine -has been incontinent for a while and this puts her at risk -she has been putting toilet paper in her depends which I advised against--her granddaughter says she's going to get more involved and help out as Uri is in need of more regular help  2. Dysuria - POC Urinalysis Dipstick appears consistent with UTI - Urine Culture sent -pt already on cipro though so may not grow out--hopefully it's effective -counseled again on hydration--should improve if [facility] is more present to check on her   3. Atrophic vaginitis - noted on exam and may be contributing to dysuria - estradiol (ESTRACE) 0.1 MG/GM vaginal cream; Place 1 Applicatorful vaginally 3 (three) times a week.  Dispense: 42.5 g; Refill: 12  4. Pain and swelling of right lower leg -quite swollen so must r/o dvt; may be cellulitis but seems less likely--hurts her when touched, but not much warmer than left leg and has been quite immobile lately - VAS [place] LOWER EXTREMITY VENOUS (DVT); Future-stat  5. Vascular dementia without behavioral disturbance ([place]) -progressive lately -she has some psychosis -needs more help at home with adls and ensuring eating, safety -given info for [employer] just[employer]navigator and for senior resources of [place] for [facility] to contact for more information  -pt instructions: Deeksha needs to drink at least 6 8oz glasses of water or juice per day.  Any beverage without caffeine can be helpful.  I  recommend estrace/premarin cream 3 times weekly at night for atrophic vaginitis.  We will call when the urine culture results return.  Also, I recommend using a barrier cream daily after bathing to keep the urine from irritating her skin.  The pharmacy will carry some of these--examples are aloe vesta and endit cream.  Eknoor needs more help at home to be safe.  Please contact the following resources:  [employer]igator [email or web address]  Tax adviser [email or web address]  Labs/tests ordered:   Orders Placed This Encounter  Procedures  . Urine Culture  . POC Urinalysis Dipstick    Next appt:  11/20/2018  [facility] L. Prim Morace, D.O. [facility] [address], [place] [address] Cell Phone (Mon-Fri 8am-5pm):  [phone] On Call:  [phone] & follow prompts after 5pm & weekends Office Phone:  [phone] Office Fax:  [phone]

## 2018-10-04 NOTE — Patient Instructions (Addendum)
[facility] needs to drink at least 6 8oz glasses of water or juice per day.  Any beverage without caffeine can be helpful.  I recommend estrace/premarin cream 3 times weekly at night for atrophic vaginitis.  We will call when the urine culture results return.  Also, I recommend using a barrier cream daily after bathing to keep the urine from irritating her skin.  The pharmacy will carry some of these--examples are aloe vesta and endit cream.  [facility] needs more help at home to be safe.  Please contact the following resources:  [employer]igator [email or web address]  Tax adviser [email or web address]

## 2018-10-05 ENCOUNTER — Telehealth: Payer: Self-pay | Admitting: *Deleted

## 2018-10-05 ENCOUNTER — Ambulatory Visit (HOSPITAL_COMMUNITY)
Admission: RE | Admit: 2018-10-05 | Discharge: 2018-10-05 | Disposition: A | Discharge status: Home / Self Care | Payer: Medicare Other | Source: Ambulatory Visit | Attending: Family | Admitting: Family

## 2018-10-05 DIAGNOSIS — M7989 Other specified soft tissue disorders: Secondary | ICD-10-CM | POA: Insufficient documentation

## 2018-10-05 DIAGNOSIS — M79661 Pain in right lower leg: Secondary | ICD-10-CM | POA: Diagnosis not present

## 2018-10-05 LAB — URINE CULTURE
MICRO NUMBER:: 475210
Result:: NO GROWTH
SPECIMEN QUALITY:: ADEQUATE

## 2018-10-05 MED ORDER — CEPHALEXIN 500 MG PO CAPS: 500.0000 mg | ORAL_CAPSULE | Freq: Two times a day (BID) | ORAL | 0 refills | Status: DC

## 2018-10-05 NOTE — Telephone Encounter (Signed)
Spoke with daughter and advised results. rx sent to pharmacy by e-script  

## 2018-10-05 NOTE — Telephone Encounter (Signed)
[facility] with [facility] called and stated that patient had her Right Lower Extremity Venous Doppler done today and it was NEGATIVE for DVT or superficial Thrombus.   Sent patient home.

## 2018-10-05 NOTE — Telephone Encounter (Signed)
-----   Message from Gayland Curry, DO sent at 10/05/2018  1:25 PM EDT ----- No blood clot.   So she has cellulitis again (skin infection).  She's been on the cipro for uti but it persists.  I suspect the urine culture won't be helpful since she was already on treatment so let's stop cipro and begin keflex 500 mg po bid for 7 days.  Vicente Males will need to monitor the leg for improvements in redness, warmth, swelling and tenderness and let me know if it does not improve over the week

## 2018-10-17 IMAGING — MG DIGITAL SCREENING BILATERAL MAMMOGRAM WITH TOMO AND CAD
8 series · 8 of 24 positions shown · non-contrast
Comparison: Previous exam(s).

CLINICAL DATA: Screening. RIGHT lumpectomy 1111.

EXAM:
DIGITAL SCREENING BILATERAL MAMMOGRAM WITH TOMO AND CAD

[L MLO synth-2D]
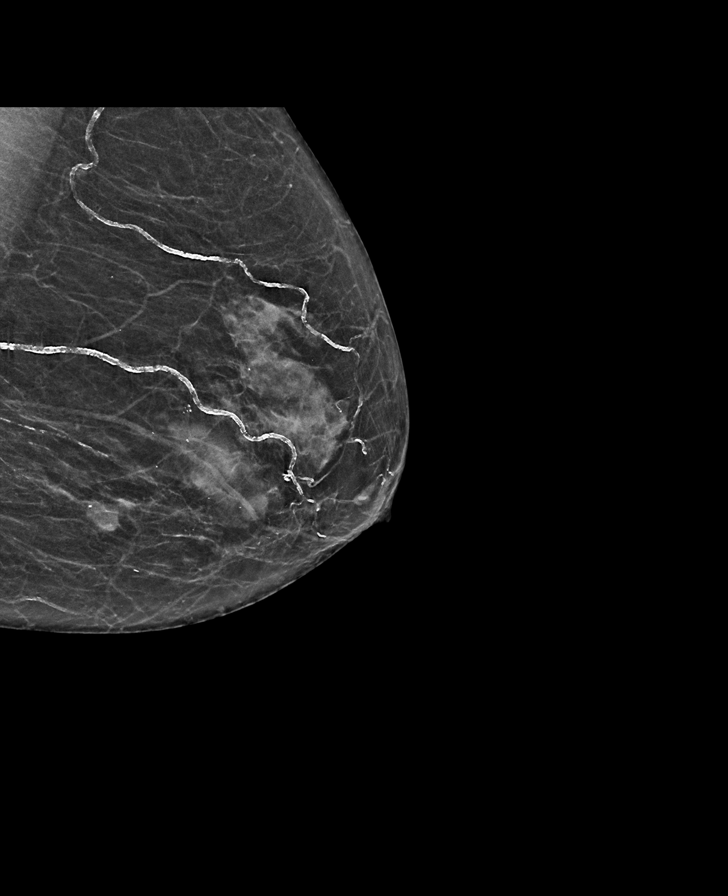

[R MLO synth-2D]
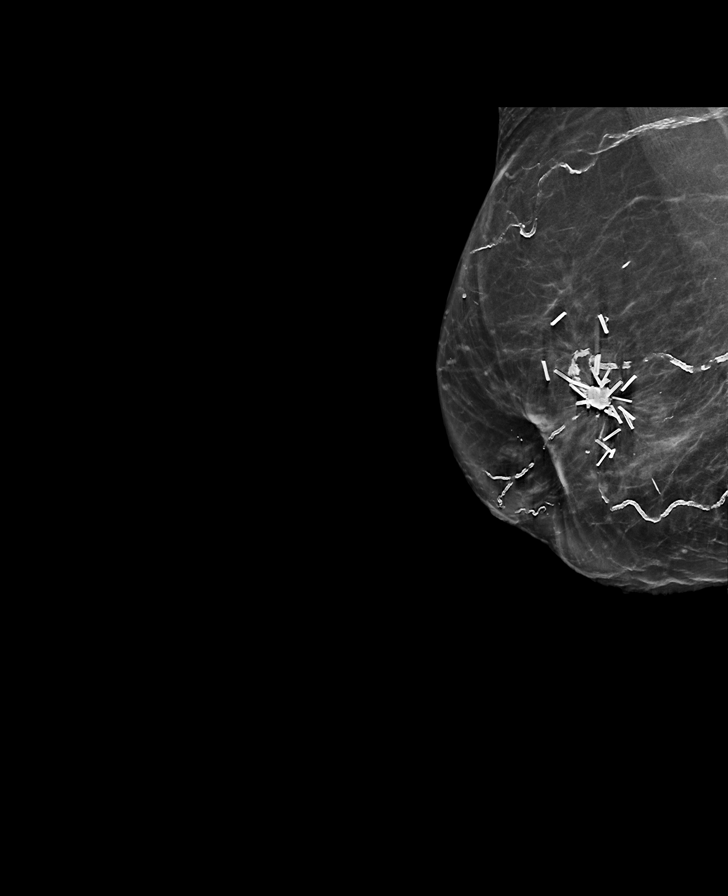

[R CC synth-2D]
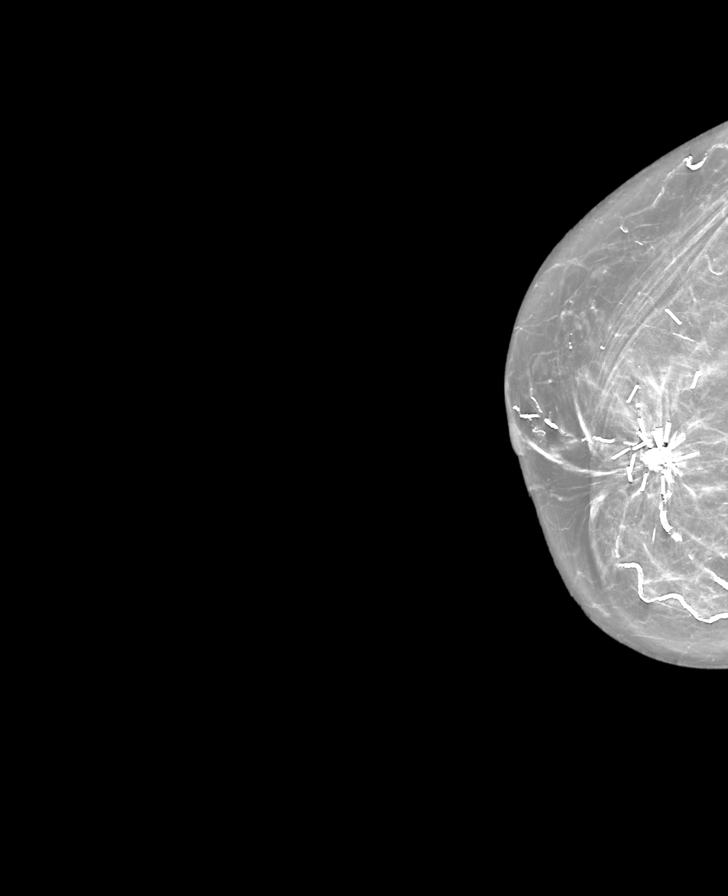

[L CC synth-2D]
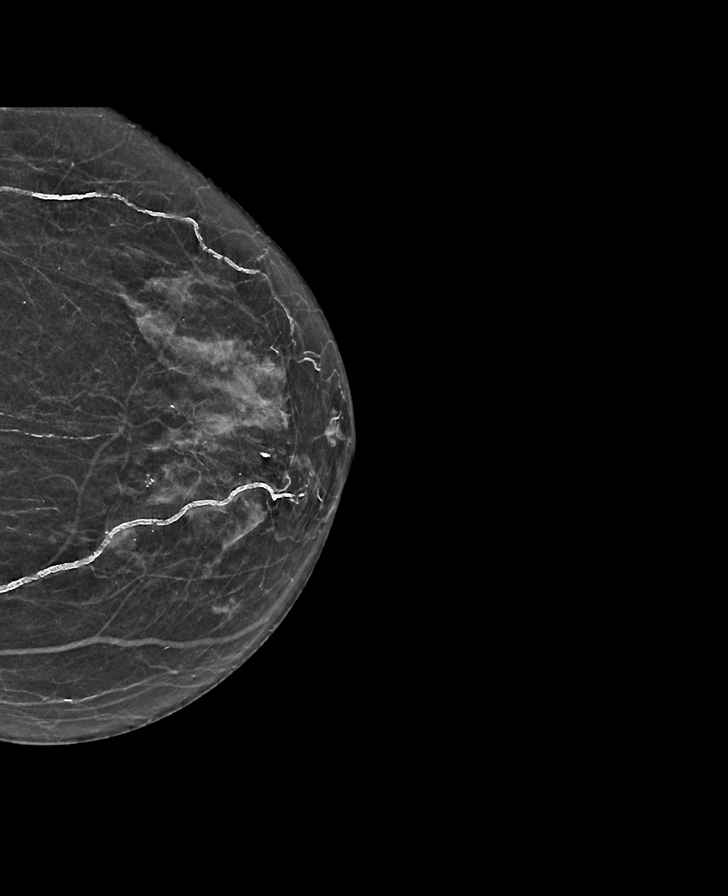

[R CC tomo · tomo slice 29/57.0]
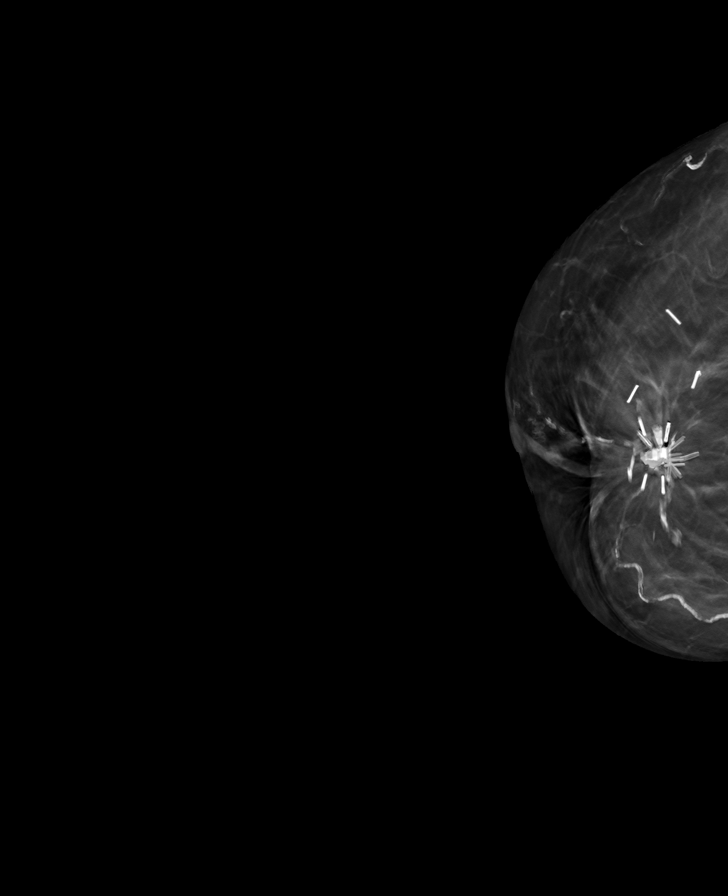

[L MLO tomo · tomo slice 26/51.0]
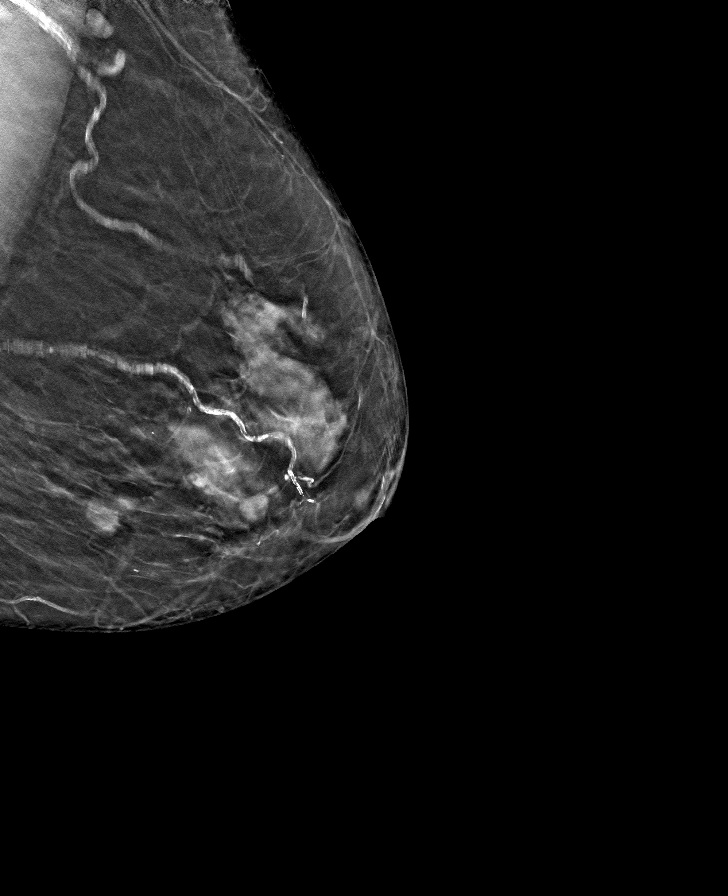

[L CC tomo · tomo slice 23/46.0]
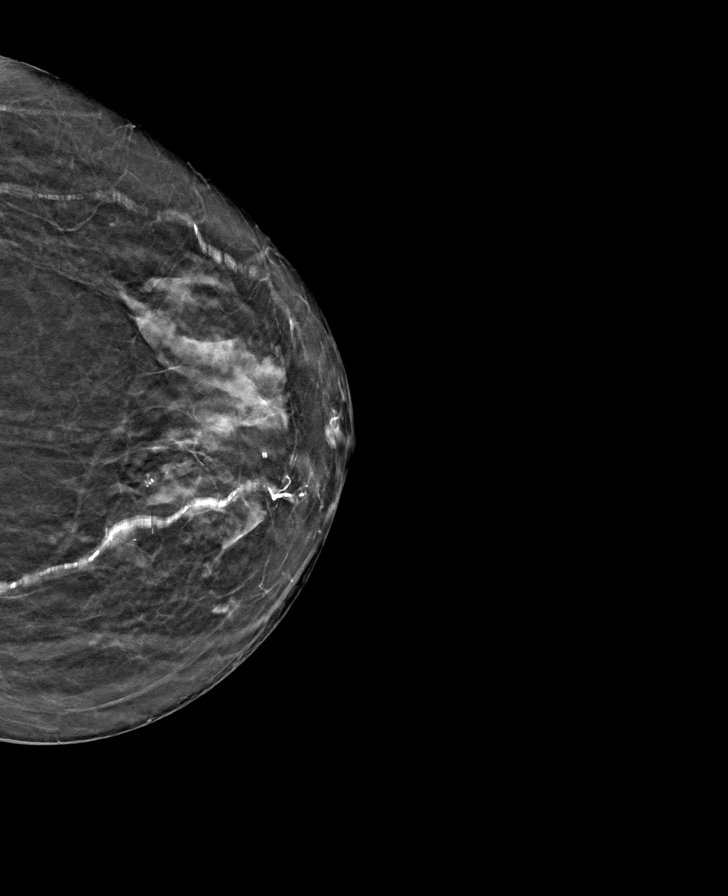

[R MLO tomo · tomo slice 33/66.0]
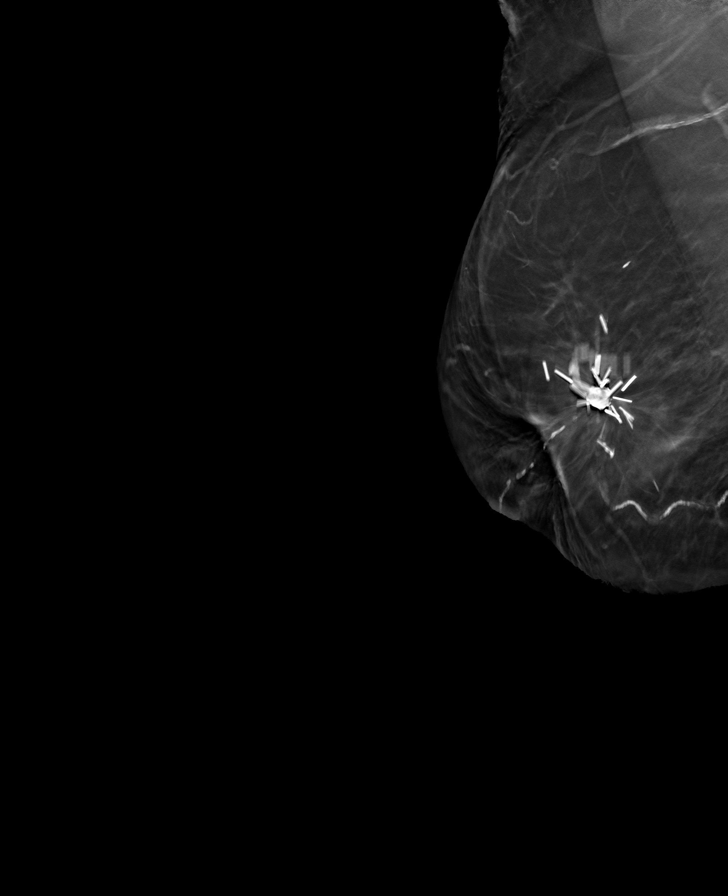

[8 of 24 positions shown; findings below may reference images not displayed]

ACR Breast Density Category c: The breast tissue is heterogeneously
dense, which may obscure small masses.
FINDINGS: There are no findings suspicious for malignancy. Post operative
changes are seen in the RIGHTbreast. Images were processed with CAD.
IMPRESSION: No mammographic evidence of malignancy. A result letter of this
screening mammogram will be mailed directly to the patient.

RECOMMENDATION:
Screening mammogram in one year. (Code:RU-1-RPV)

BI-RADS CATEGORY  1: Negative.

## 2018-10-22 ENCOUNTER — Other Ambulatory Visit: Payer: Self-pay

## 2018-10-22 ENCOUNTER — Encounter: Payer: Self-pay | Admitting: Podiatry

## 2018-10-22 ENCOUNTER — Ambulatory Visit: Payer: Medicare Other | Admitting: Podiatry

## 2018-10-22 VITALS — Temp 97.5°F

## 2018-10-22 DIAGNOSIS — B351 Tinea unguium: Secondary | ICD-10-CM

## 2018-10-22 DIAGNOSIS — M79676 Pain in unspecified toe(s): Secondary | ICD-10-CM

## 2018-10-22 NOTE — Patient Instructions (Signed)

## 2018-10-27 NOTE — Progress Notes (Signed)
Subjective: [facility] presents today for follow up of painful, thick toenails 1-5 b/l that she cannot cut and which interfere with daily activities.  Pain is aggravated when wearing enclosed shoe gear.  Reed, Tiffany L, DO is her PCP. Last visit was 10/04/2018.  She is again accompanied by her grandson-in-law who voices no new concerns on today's visit.   Current Outpatient Medications:  .  acetaminophen (TYLENOL) 325 MG tablet, Take 325 mg by mouth every 6 (six) hours as needed for mild pain or headache., Disp: , Rfl:  .  alendronate (FOSAMAX) 70 MG tablet, Take 1 tablet (70 mg total) by mouth once a week. Take with a full glass of water on an empty stomach., Disp: 12 tablet, Rfl: 3 .  cephALEXin (KEFLEX) 500 MG capsule, Take 1 capsule (500 mg total) by mouth 2 (two) times daily., Disp: 14 capsule, Rfl: 0 .  cholecalciferol (VITAMIN D) 1000 UNITS tablet, Take 2,000 Units by mouth daily. , Disp: , Rfl:  .  DILT-XR 240 MG 24 hr capsule, TAKE 1 CAPSULE(240 MG) BY MOUTH DAILY, Disp: 90 capsule, Rfl: 1 .  estradiol (ESTRACE) 0.1 MG/GM vaginal cream, Place 1 Applicatorful vaginally 3 (three) times a week., Disp: 42.5 g, Rfl: 12 .  mirabegron ER (MYRBETRIQ) 25 MG TB24 tablet, Take 1 tablet (25 mg total) by mouth daily., Disp: 90 tablet, Rfl: 1 .  Multiple Vitamin (MULTIVITAMIN WITH MINERALS) TABS tablet, Take 1 tablet by mouth daily., Disp: , Rfl:  .  naproxen (NAPROSYN) 250 MG tablet, TAKE 1 TABLET BY MOUTH TWICE DAILY FOR 5 DAYS. MAY USE EVERY 12 HOURS AS NEEDED PAIN, Disp: 30 tablet, Rfl: 0 .  nystatin (MYCOSTATIN/NYSTOP) powder, Apply topically 2 (two) times daily. Underneath breasts, Disp: 30 g, Rfl: 0  Allergies  Allergen Reactions  . Amoxicillin Anaphylaxis and Other (See Comments)    Has patient had a PCN reaction causing immediate rash, facial/tongue/throat swelling, SOB or lightheadedness with hypotension: Yes Has patient had a PCN reaction causing severe rash involving mucus  membranes or skin necrosis: No Has patient had a PCN reaction that required hospitalization No Has patient had a PCN reaction occurring within the last 10 years: No If all of the above answers are "NO", then may proceed with Cephalosporin use.  . Angiotensin Receptor Blockers Anaphylaxis, Itching and Rash  . Beta Adrenergic Blockers Anaphylaxis, Itching and Rash  . Cephalexin Anaphylaxis  . Clindamycin/Lincomycin Anaphylaxis, Itching and Rash  . Eggs Or Egg-Derived Products Other (See Comments)    Reaction:  Blisters in mouth   . Tape Other (See Comments)    Reaction:  Pulls skin off   . Corticosteroids Itching and Rash  . Hydrocortisone Itching and Rash  . Latex Itching and Rash  . Other Itching, Rash and Other (See Comments)    Pt states that she has a pine allergy and she is only able to use fragrance free soaps and laundry products.    . Reglan [Metoclopramide] Itching and Rash    Objective: Vitals:   10/22/18 1027  Temp: (!) 97.5 F (36.4 C)   Vascular Examination: Capillary refill time immediate x 10 digits.  Dorsalis pedis and Posterior tibial pulses palpable b/l.  Digital hair present x 10 digits.  Skin temperature gradient WNL b/l.  Dermatological Examination: Skin with normal turgor, texture and tone b/l.  Toenails 1-5 b/l discolored, thick, dystrophic with subungual debris and pain with palpation to nailbeds due to thickness of nails.  Evidence of subacute subungual hematoma.  No edema, no erythema, no drainage, no flocculence.  Musculoskeletal: Muscle strength 5/5 to all LE muscle groups b/l.  No gross bony deformities b/l.  No pain, crepitus or joint limitation noted with ROM.   Neurological: Sensation intact with 10 gram monofilament.  Vibratory sensation intact.  Assessment: Painful onychomycosis toenails 1-5 b/l   Plan: 1. Toenails 1-5 b/l were debrided in length and girth without iatrogenic bleeding. 2. Patient to continue soft, supportive  shoe gear daily. 3. Patient to report any pedal injuries to medical professional immediately. 4. Follow up 3 months.  5. Patient/POA to call should there be a concern in the interim.

## 2018-11-19 ENCOUNTER — Encounter: Payer: Medicare Other | Admitting: Internal Medicine

## 2018-11-19 ENCOUNTER — Encounter: Payer: Self-pay | Admitting: Family

## 2018-11-19 ENCOUNTER — Ambulatory Visit: Payer: Self-pay

## 2018-11-20 ENCOUNTER — Encounter: Payer: Self-pay | Admitting: Family

## 2018-11-26 ENCOUNTER — Ambulatory Visit: Payer: Medicare Other | Admitting: Internal Medicine

## 2018-12-05 ENCOUNTER — Other Ambulatory Visit: Payer: Self-pay | Admitting: Internal Medicine

## 2018-12-05 DIAGNOSIS — M810 Age-related osteoporosis without current pathological fracture: Secondary | ICD-10-CM

## 2018-12-07 ENCOUNTER — Telehealth: Payer: Self-pay | Admitting: *Deleted

## 2018-12-07 NOTE — Telephone Encounter (Signed)
Daughter called requesting an updated Handicap Placard.  Filled out and placed in Dr. Cyndi Lennert folder to review and sign.  Please call daughter once ready for pickup.

## 2018-12-30 ENCOUNTER — Other Ambulatory Visit: Payer: Self-pay | Admitting: Internal Medicine

## 2018-12-30 DIAGNOSIS — I10 Essential (primary) hypertension: Secondary | ICD-10-CM

## 2019-01-29 ENCOUNTER — Ambulatory Visit: Payer: Medicare Other | Admitting: Podiatry

## 2019-02-25 ENCOUNTER — Telehealth: Payer: Self-pay | Admitting: *Deleted

## 2019-02-25 DIAGNOSIS — H40023 Open angle with borderline findings, high risk, bilateral: Secondary | ICD-10-CM | POA: Diagnosis not present

## 2019-02-25 DIAGNOSIS — H2513 Age-related nuclear cataract, bilateral: Secondary | ICD-10-CM | POA: Diagnosis not present

## 2019-02-25 DIAGNOSIS — H353222 Exudative age-related macular degeneration, left eye, with inactive choroidal neovascularization: Secondary | ICD-10-CM | POA: Diagnosis not present

## 2019-02-25 DIAGNOSIS — H43813 Vitreous degeneration, bilateral: Secondary | ICD-10-CM | POA: Diagnosis not present

## 2019-02-25 NOTE — Telephone Encounter (Signed)
Vicente Males, Granddaughter called and left message on Clinical Intake and stated that patient was having some Degenerative Changes. Also having bleeding from rectum and Mobility poor.   Called and LMOM for Vicente Males to return call to schedule patient an appointment to be seen in office. Awaiting callback.

## 2019-02-26 ENCOUNTER — Other Ambulatory Visit: Payer: Self-pay | Admitting: Nurse Practitioner

## 2019-02-26 DIAGNOSIS — Z1231 Encounter for screening mammogram for malignant neoplasm of breast: Secondary | ICD-10-CM

## 2019-02-26 NOTE — Telephone Encounter (Signed)
Granddaughter scheduled an appointment for 02/28/2019.

## 2019-02-28 ENCOUNTER — Other Ambulatory Visit: Payer: Self-pay

## 2019-02-28 ENCOUNTER — Encounter: Payer: Self-pay | Admitting: Internal Medicine

## 2019-02-28 ENCOUNTER — Ambulatory Visit (INDEPENDENT_AMBULATORY_CARE_PROVIDER_SITE_OTHER): Payer: Medicare Other | Admitting: Internal Medicine

## 2019-02-28 VITALS — BP 148/88 | HR 100 | Temp 98.7°F | Ht 65.0 in | Wt 144.0 lb

## 2019-02-28 DIAGNOSIS — Z23 Encounter for immunization: Secondary | ICD-10-CM

## 2019-02-28 DIAGNOSIS — F0150 Vascular dementia without behavioral disturbance: Secondary | ICD-10-CM

## 2019-02-28 DIAGNOSIS — H5340 Unspecified visual field defects: Secondary | ICD-10-CM

## 2019-02-28 DIAGNOSIS — E7800 Pure hypercholesterolemia, unspecified: Secondary | ICD-10-CM | POA: Diagnosis not present

## 2019-02-28 DIAGNOSIS — I679 Cerebrovascular disease, unspecified: Secondary | ICD-10-CM | POA: Diagnosis not present

## 2019-02-28 DIAGNOSIS — K5901 Slow transit constipation: Secondary | ICD-10-CM | POA: Diagnosis not present

## 2019-02-28 DIAGNOSIS — K625 Hemorrhage of anus and rectum: Secondary | ICD-10-CM

## 2019-02-28 NOTE — Patient Instructions (Signed)
I'll let you know next steps after I get a call back from ophthalmology.

## 2019-02-28 NOTE — Progress Notes (Addendum)
Location:  [facility] clinic Provider: Kelyn Ponciano L. Mariea Foster, D.O., C.M.D.  Code Status: DNR Goals of Care:  Advanced Directives 07/09/2018  Does Patient Have a Medical Advance Directive? Yes  Type of Advance Directive [place]  Does patient want to make changes to medical advance directive? No - Patient declined  Copy of [place] in Chart? -  Would patient like information on creating a medical advance directive? -  Pre-existing out of facility DNR order (yellow form or pink MOST form) -   Chief Complaint  Patient presents with  . Acute Visit    Rectal bleeding, increased weakness (discuss PT/OT), memory worse, and eye problems (seen eye doctor and will see retina specialist tomorrow)   . Immunizations    Discuss need for flu vaccine today     HPI: Patient is a 85 y.o. female seen today for an acute visit for visual changes and rectal bleeding.  When she looks in the mirror, the right upper part of her vision is black--the  ophthalmologist said that she may be having TIAs and it was due to her brain not her eyes.  It last happened yesterday.  The blackness comes over like a mask is over it and then it goes away.  She also sees other black spots different places.    Increased overall weakness, but no focal symptoms.  Staggers with the cane and goes sideways.  Their goal is to get her out of her house by the end of this year.    Beth Foster is finally coming to terms with the fact that she cannot stay by herself especially not being able to see well.    She is also hearing and seeing people working in her the back of her home in an apt.    She feels like she sees cats that are put in bags hanging in the trees.  "they spray them and hang them in trees, then they fall on homes and damage them."  She's seen the cats in her yard.  It's real to her.    Rectal bleeding may have begun about 3-4 weeks ago.  It happens every so often.  It's not continual all the  time.  She wears depends.  She's had it to where it has dripped on the back of her shoes.  After using the restroom, there was some dripped on the floor.  She's not leaving a spot if she sits somewhere so her grandson-in-law thinks it's from changing her depends.  She says when it bleeds, it pours out and sometimes she doesn't get all of it.  She had stayed with her granddaughter and grandson-in-law's and she had some bleeding there.  No abdominal pain.  She gets a feeling it's time to go.  She does not have pain in her bottom either.   They don't know if there've been clots.  It's not been bright red when they've seen it.  [place] says it's regular red when it happens.      Past Medical History:  Diagnosis Date  . Anxiety state, unspecified   . Bunion   . Contact dermatitis and other eczema due to other chemical products   . Edema   . External hemorrhoids without mention of complication   . GERD (gastroesophageal reflux disease)   . Hypertension   . Malignant neoplasm of breast (female), unspecified site dx'd 1996   rt breast; xrt   . Pain in joint, pelvic region and thigh   .  Rash and other nonspecific skin eruption   . Stroke ([place])   . Unspecified cataract   . Unspecified late effects of cerebrovascular disease   . Viral hepatitis A without mention of hepatic coma   . Viral hepatitis A without mention of hepatic coma   . Vitamin D deficiency     Past Surgical History:  Procedure Laterality Date  . BREAST LUMPECTOMY  1995  . CARDIAC CATHETERIZATION  2004  . CHOLECYSTECTOMY  1972    Allergies  Allergen Reactions  . Amoxicillin Anaphylaxis and Other (See Comments)    Has patient had a PCN reaction causing immediate rash, facial/tongue/throat swelling, SOB or lightheadedness with hypotension: Yes Has patient had a PCN reaction causing severe rash involving mucus membranes or skin necrosis: No Has patient had a PCN reaction that required hospitalization No Has patient had a PCN  reaction occurring within the last 10 years: No If all of the above answers are "NO", then may proceed with Cephalosporin use.  . Angiotensin Receptor Blockers Anaphylaxis, Itching and Rash  . Beta Adrenergic Blockers Anaphylaxis, Itching and Rash  . Cephalexin Anaphylaxis  . Clindamycin/Lincomycin Anaphylaxis, Itching and Rash  . Eggs Or Egg-Derived Products Other (See Comments)    Reaction:  Blisters in mouth   . Tape Other (See Comments)    Reaction:  Pulls skin off   . Corticosteroids Itching and Rash  . Hydrocortisone Itching and Rash  . Latex Itching and Rash  . Other Itching, Rash and Other (See Comments)    Pt states that she has a pine allergy and she is only able to use fragrance free soaps and laundry products.    . Reglan [Metoclopramide] Itching and Rash    Outpatient Encounter Medications as of 02/28/2019  Medication Sig  . acetaminophen (TYLENOL) 325 MG tablet Take 325 mg by mouth every 6 (six) hours as needed for mild pain or headache.  . alendronate (FOSAMAX) 70 MG tablet TAKE 1 TABLET BY MOUTH ONCE A WEEK WITH A FULL GLASS OF WATER ON AN EMPTY STOMACH  . cholecalciferol (VITAMIN D) 1000 UNITS tablet Take 2,000 Units by mouth daily.   Marland Kitchen DILT-XR 240 MG 24 hr capsule TAKE 1 CAPSULE(240 MG) BY MOUTH DAILY  . estradiol (ESTRACE) 0.1 MG/GM vaginal cream Place 1 Applicatorful vaginally 3 (three) times a week.  . Multiple Vitamins-Minerals (EYE VITAMINS PO) Take 1 tablet by mouth daily.  . naproxen (NAPROSYN) 250 MG tablet TAKE 1 TABLET BY MOUTH TWICE DAILY FOR 5 DAYS. MAY USE EVERY 12 HOURS AS NEEDED PAIN  . [DISCONTINUED] cephALEXin (KEFLEX) 500 MG capsule Take 1 capsule (500 mg total) by mouth 2 (two) times daily.  . [DISCONTINUED] mirabegron ER (MYRBETRIQ) 25 MG TB24 tablet Take 1 tablet (25 mg total) by mouth daily. (Patient not taking: Reported on 02/28/2019)  . [DISCONTINUED] Multiple Vitamin (MULTIVITAMIN WITH MINERALS) TABS tablet Take 1 tablet by mouth daily.  .  [DISCONTINUED] nystatin (MYCOSTATIN/NYSTOP) powder Apply topically 2 (two) times daily. Underneath breasts (Patient not taking: Reported on 02/28/2019)   No facility-administered encounter medications on file as of 02/28/2019.     Review of Systems:  Review of Systems  Constitutional: Negative for chills, fever and malaise/fatigue.  HENT: Negative for congestion and hearing loss.   Eyes: Positive for blurred vision. Negative for double vision, photophobia and pain.  Respiratory: Negative for cough and shortness of breath.   Cardiovascular: Negative for chest pain, palpitations, orthopnea and leg swelling.  Gastrointestinal: Negative for abdominal pain, blood in  stool, constipation, diarrhea, melena, nausea and vomiting.  Genitourinary: Negative for dysuria.  Musculoskeletal: Negative for falls and joint pain.  Skin: Negative for itching and rash.  Neurological: Positive for weakness. Negative for dizziness and loss of consciousness.  Endo/Heme/Allergies: Does not bruise/bleed easily.  Psychiatric/Behavioral: Positive for hallucinations and memory loss. Negative for depression. The patient is not nervous/anxious and does not have insomnia.     Health Maintenance  Topic Date Due  . INFLUENZA VACCINE  12/22/2018  . TETANUS/TDAP  01/09/2025  . DEXA SCAN  Completed  . PNA vac Low Risk Adult  Completed    Physical Exam: Vitals:   02/28/19 0843  BP: (!) 148/88  Pulse: 100  Temp: 98.7 F (37.1 C)  TempSrc: Temporal  SpO2: 94%  Weight: 144 lb (65.3 kg)  Height: 5\' 5"  (1.651 m)   Body mass index is 23.96 kg/m. Physical Exam Vitals signs reviewed.  Constitutional:      Appearance: Normal appearance.  HENT:     Head: Normocephalic and atraumatic.  Eyes:     General: No scleral icterus.       Right eye: No discharge.        Left eye: No discharge.     Extraocular Movements: Extraocular movements intact.     Conjunctiva/sclera: Conjunctivae normal.     Pupils: Pupils are equal,  round, and reactive to light.     Comments: Slight ptosis of left eye, but seems to be age-related, not cranial nerve  Cardiovascular:     Rate and Rhythm: Regular rhythm. Tachycardia present.     Heart sounds: Normal heart sounds. No murmur.  Pulmonary:     Effort: Pulmonary effort is normal.     Breath sounds: Normal breath sounds. No wheezing, rhonchi or rales.  Genitourinary:    Vagina: No vaginal discharge.     Rectum: Guaiac result positive.     Comments: External hemorrhoids visible, but do not appear to be source of bleeding; hard stool in rectal vault was heme positive Musculoskeletal: Normal range of motion.     Right lower leg: No edema.     Left lower leg: No edema.  Skin:    General: Skin is warm and dry.     Capillary Refill: Capillary refill takes less than 2 seconds.  Neurological:     Mental Status: She is alert.     Cranial Nerves: No cranial nerve deficit.     Sensory: No sensory deficit.     Motor: Weakness present.     Coordination: Coordination normal.     Gait: Gait abnormal.     Deep Tendon Reflexes: Reflexes normal.     Comments: Seems to have decreased vision in right visual field when counting fingers for me; overall vision very poor; ambulates with walker  Psychiatric:        Mood and Affect: Mood normal.     Labs reviewed: Basic Metabolic Panel: No results for input(s): NA, K, CL, CO2, GLUCOSE, BUN, CREATININE, CALCIUM, MG, PHOS, TSH in the last 8760 hours. Liver Function Tests: No results for input(s): AST, ALT, ALKPHOS, BILITOT, PROT, ALBUMIN in the last 8760 hours. No results for input(s): LIPASE, AMYLASE in the last 8760 hours. No results for input(s): AMMONIA in the last 8760 hours. CBC: No results for input(s): WBC, NEUTROABS, HGB, HCT, MCV, PLT in the last 8760 hours. Lipid Panel: No results for input(s): CHOL, HDL, LDLCALC, TRIG, CHOLHDL, LDLDIRECT in the last 8760 hours. Lab Results  Component Value Date  HGBA1C 5.9 (H) 05/19/2017     Assessment/Plan 1. Rectal bleeding - will need to assess labs for severity; I'm concerned about her tachycardia though she's hyper not hypotensive, does feel weak (? Due to this or deconditioning)--reports typically bright red or maroon suggesting LGI--?internal hemorrhoids vs diverticular vs ischemic - CBC with Differential/Platelet - COMPLETE METABOLIC PANEL WITH GFR - Iron, TIBC and Ferritin Panel  2. Slow transit constipation - advised to use senokot-s 2 tabs daily to soften stool and help with motility, as well - Ambulatory referral to [place]  3. Visual field cut - right sided upper and lower on my exam; need to speak with Dr. Satira Sark to assess if this is all due to advanced macular degeneration vs stroke or TIA--if potentially stroke, would check MRI and carotid dopplers  - Lipid panel - Ambulatory referral to [place]  4. Cerebrovascular disease - already known and has had dementia that's been progressing over the past few years - Lipid panel - Ambulatory referral to [place]  5. Vascular dementia without behavioral disturbance (HCC) - her hallucinations seem to be dementia-related not due to her macular degeneration  - now seeing cats in trees in bags, etc. - CBC with Differential/Platelet - COMPLETE METABOLIC PANEL WITH GFR - Lipid panel - Iron, TIBC and Ferritin Panel - Ambulatory referral to Home Health due to progressive visual loss, cognitive deficits--does have life alert, family is highly involved getting her meals and checking on her -family has finally convinced pt that she needs to move to a facility for safety--they say the goal is to have her out of her home by the end of this year  6. Need for influenza vaccination - Flu Vaccine QUAD High Dose(Fluad)  7. Pure hypercholesterolemia -f/u lab today  Labs/tests ordered:  Lab Orders     CBC with Differential/Platelet     COMPLETE METABOLIC PANEL WITH GFR     Lipid panel     Iron, TIBC and Ferritin  Panel  Next appt:   03/18/2019 f/u on above concerns  Jerrod Damiano L. Ziona Wickens, D.O. [facility] [address], [place] [address] Cell Phone (Mon-Fri 8am-5pm):  [phone] On Call:  [phone] & follow prompts after 5pm & weekends Office Phone:  [phone] Office Fax:  [phone]  Spoke with Dr. Satira Sark on 03/01/19 end of day.  He advised that when he saw Derra, she was only able to count fingers throughout her vision which was a general decline from when she'd seen Dr. Prudencio Burly previously.  She had spots throughout her vision from her macular.  He had reviewed with pt and her granddaughter that transient ischemic attacks present with the shade coming down over one eye and that's a medical emergency.  Seems family interpreted that she'd already had a brain event, but appears her symptoms are more likely due to her progressive macular degeneration at this point.  I will ask CMA to call granddaughter to recommend MRI and carotid dopplers to evaluate for any stroke events to help [place] plan for future.    Fatmata Legere L. Anya Murphey, D.O. [facility] [address],  [address] Cell Phone (Mon-Fri 8am-5pm):  [phone] On Call:  [phone] & follow prompts after 5pm & weekends Office Phone:  [phone] Office Fax:  [phone]

## 2019-03-01 LAB — COMPLETE METABOLIC PANEL WITH GFR
AG Ratio: 1.4 (calc) (ref 1.0–2.5)
ALT: 11 U/L (ref 6–29)
AST: 15 U/L (ref 10–35)
Albumin: 4.2 g/dL (ref 3.6–5.1)
Alkaline phosphatase (APISO): 60 U/L (ref 37–153)
BUN: 8 mg/dL (ref 7–25)
CO2: 28 mmol/L (ref 20–32)
Calcium: 9.6 mg/dL (ref 8.6–10.4)
Chloride: 107 mmol/L (ref 98–110)
Creat: 0.66 mg/dL (ref 0.60–0.88)
GFR, Est African American: 91 mL/min/{1.73_m2} (ref >=60)
GFR, Est Non African American: 79 mL/min/{1.73_m2} (ref >=60)
Globulin: 3 g/dL (calc) (ref 1.9–3.7)
Glucose, Bld: 114 mg/dL — ABNORMAL HIGH (ref 65–99)
Potassium: 4.1 mmol/L (ref 3.5–5.3)
Sodium: 142 mmol/L (ref 135–146)
Total Bilirubin: 0.5 mg/dL (ref 0.2–1.2)
Total Protein: 7.2 g/dL (ref 6.1–8.1)

## 2019-03-01 LAB — CBC WITH DIFFERENTIAL/PLATELET
Absolute Monocytes: 380 cells/uL (ref 200–950)
Basophils Absolute: 30 cells/uL (ref 0–200)
Basophils Relative: 0.6 %
Eosinophils Absolute: 70 cells/uL (ref 15–500)
Eosinophils Relative: 1.4 %
HCT: 39.5 % (ref 35.0–45.0)
Hemoglobin: 13.2 g/dL (ref 11.7–15.5)
Lymphs Abs: 1095 cells/uL (ref 850–3900)
MCH: 32 pg (ref 27.0–33.0)
MCHC: 33.4 g/dL (ref 32.0–36.0)
MCV: 95.9 fL (ref 80.0–100.0)
MPV: 11.1 fL (ref 7.5–12.5)
Monocytes Relative: 7.6 %
Neutro Abs: 3425 cells/uL (ref 1500–7800)
Neutrophils Relative %: 68.5 %
Platelets: 212 10*3/uL (ref 140–400)
RBC: 4.12 10*6/uL (ref 3.80–5.10)
RDW: 11.8 % (ref 11.0–15.0)
Total Lymphocyte: 21.9 %
WBC: 5 10*3/uL (ref 3.8–10.8)

## 2019-03-01 LAB — LIPID PANEL
Cholesterol: 210 mg/dL — ABNORMAL HIGH (ref <200)
HDL: 77 mg/dL (ref >=50)
LDL Cholesterol (Calc): 118 mg/dL (calc) — ABNORMAL HIGH
Non-HDL Cholesterol (Calc): 133 mg/dL (calc) — ABNORMAL HIGH (ref <130)
Total CHOL/HDL Ratio: 2.7 (calc) (ref <5.0)
Triglycerides: 56 mg/dL (ref <150)

## 2019-03-01 LAB — IRON,TIBC AND FERRITIN PANEL
%SAT: 32 % (calc) (ref 16–45)
Ferritin: 18 ng/mL (ref 16–288)
Iron: 92 ug/dL (ref 45–160)
TIBC: 286 mcg/dL (calc) (ref 250–450)

## 2019-03-04 ENCOUNTER — Telehealth: Payer: Self-pay | Admitting: Internal Medicine

## 2019-03-04 DIAGNOSIS — H353233 Exudative age-related macular degeneration, bilateral, with inactive scar: Secondary | ICD-10-CM | POA: Diagnosis not present

## 2019-03-04 DIAGNOSIS — H25813 Combined forms of age-related cataract, bilateral: Secondary | ICD-10-CM | POA: Diagnosis not present

## 2019-03-04 DIAGNOSIS — H401133 Primary open-angle glaucoma, bilateral, severe stage: Secondary | ICD-10-CM | POA: Diagnosis not present

## 2019-03-04 DIAGNOSIS — H35363 Drusen (degenerative) of macula, bilateral: Secondary | ICD-10-CM | POA: Diagnosis not present

## 2019-03-04 DIAGNOSIS — H35453 Secondary pigmentary degeneration, bilateral: Secondary | ICD-10-CM | POA: Diagnosis not present

## 2019-03-04 NOTE — Telephone Encounter (Signed)
Dr. Satira Sark called me back.  He had reviewed how TIAs present with family at visit last Monday.  Beth Foster has a lot of changes suggesting that her current right-sided visual loss is more likely due to the macular degeneration.  I have not gotten a note from Dr. Posey Pronto about her retina appt from Friday yet.  I think, at this point, it makes sense to check carotid dopplers and an MRI of the brain on [name] to see where she stands with prior stroke events.  I suspect she's had some small strokes that have contributed to her cognitive decline, also, though they may not explain her most recent eye changes.  Please call her granddaughter, Beth Foster, and see if she agrees to the MRI and carotid dopplers and if she thinks [name] will tolerate going through the MRI machine.

## 2019-03-05 ENCOUNTER — Other Ambulatory Visit: Payer: Self-pay

## 2019-03-05 ENCOUNTER — Ambulatory Visit: Payer: Medicare Other | Admitting: Podiatry

## 2019-03-05 ENCOUNTER — Encounter: Payer: Self-pay | Admitting: Podiatry

## 2019-03-05 ENCOUNTER — Telehealth: Payer: Self-pay

## 2019-03-05 DIAGNOSIS — J309 Allergic rhinitis, unspecified: Secondary | ICD-10-CM | POA: Diagnosis not present

## 2019-03-05 DIAGNOSIS — B351 Tinea unguium: Secondary | ICD-10-CM

## 2019-03-05 DIAGNOSIS — H5340 Unspecified visual field defects: Secondary | ICD-10-CM

## 2019-03-05 DIAGNOSIS — I872 Venous insufficiency (chronic) (peripheral): Secondary | ICD-10-CM

## 2019-03-05 DIAGNOSIS — M79676 Pain in unspecified toe(s): Secondary | ICD-10-CM | POA: Diagnosis not present

## 2019-03-05 DIAGNOSIS — Z9181 History of falling: Secondary | ICD-10-CM | POA: Diagnosis not present

## 2019-03-05 DIAGNOSIS — Z87440 Personal history of urinary (tract) infections: Secondary | ICD-10-CM | POA: Diagnosis not present

## 2019-03-05 DIAGNOSIS — F411 Generalized anxiety disorder: Secondary | ICD-10-CM

## 2019-03-05 DIAGNOSIS — H269 Unspecified cataract: Secondary | ICD-10-CM | POA: Diagnosis not present

## 2019-03-05 DIAGNOSIS — K219 Gastro-esophageal reflux disease without esophagitis: Secondary | ICD-10-CM | POA: Diagnosis not present

## 2019-03-05 DIAGNOSIS — Z8673 Personal history of transient ischemic attack (TIA), and cerebral infarction without residual deficits: Secondary | ICD-10-CM | POA: Diagnosis not present

## 2019-03-05 DIAGNOSIS — I679 Cerebrovascular disease, unspecified: Secondary | ICD-10-CM

## 2019-03-05 DIAGNOSIS — K644 Residual hemorrhoidal skin tags: Secondary | ICD-10-CM | POA: Diagnosis not present

## 2019-03-05 DIAGNOSIS — Z853 Personal history of malignant neoplasm of breast: Secondary | ICD-10-CM | POA: Diagnosis not present

## 2019-03-05 DIAGNOSIS — N3281 Overactive bladder: Secondary | ICD-10-CM

## 2019-03-05 DIAGNOSIS — E559 Vitamin D deficiency, unspecified: Secondary | ICD-10-CM | POA: Diagnosis not present

## 2019-03-05 DIAGNOSIS — E785 Hyperlipidemia, unspecified: Secondary | ICD-10-CM | POA: Diagnosis not present

## 2019-03-05 DIAGNOSIS — F0150 Vascular dementia without behavioral disturbance: Secondary | ICD-10-CM

## 2019-03-05 DIAGNOSIS — K5901 Slow transit constipation: Secondary | ICD-10-CM

## 2019-03-05 DIAGNOSIS — I10 Essential (primary) hypertension: Secondary | ICD-10-CM | POA: Diagnosis not present

## 2019-03-05 NOTE — Telephone Encounter (Signed)
Nurse Orland Mustard for physical therapy called to get a new order for an eval for patient for today as the patient had and appointment yesterday and was not able to be seen for this evaluation. Please advise her number is [phone]

## 2019-03-05 NOTE — Patient Instructions (Signed)

## 2019-03-05 NOTE — Telephone Encounter (Signed)
I recommend we do the carotid doppler prior to her having cataract surgery, but I do think she should have the surgery after we get that information.  It will make monitoring her other visual issues easier.  I will order her carotid doppler and Lattie Haw or staff at the vascular lab can make the arrangements with Vicente Males for the most convenient time for the test.

## 2019-03-05 NOTE — Telephone Encounter (Signed)
Returned call to Beth Foster to give her the verbal order for the patient to have evaluation done today but I got answering machine left message for her to call office for the verbal order

## 2019-03-05 NOTE — Telephone Encounter (Signed)
Beth Foster with [facility] returned Beth Foster's call. Verbal orders given for PT 2x5wks and Education officer, museum

## 2019-03-05 NOTE — Telephone Encounter (Signed)
Spoke with patient's granddaughter(Anne)  regarding whether she thought that she would be able to tolerate a MRI and carotid doppler study. The granddaughter spoke with the grandmother and she stated that she would not be able to tolerate the MRI, she is afraid. Her granddaughter also stated that they went to see the retina specialist yesterday and he said that her left eye is unrecoverable, but her right eye would require cataracts surgery and drops for glaucoma to reduce the pressure. She wants Dr. Mariea Clonts to advise to whether this surgery should be done.  Her granddaughter states that Friday would be her first choice and Monday afternoon would be her second choice for appointments. Dr Mariea Clonts please advise when this doppler should be scheduled.

## 2019-03-06 NOTE — Telephone Encounter (Signed)
Kathyrn Lass spoke with daughter regarding the carotid US, she stated that the daughter will call and schedule that appointment according to her schedule.

## 2019-03-08 ENCOUNTER — Emergency Department (HOSPITAL_COMMUNITY): Payer: Medicare Other

## 2019-03-08 ENCOUNTER — Inpatient Hospital Stay (HOSPITAL_COMMUNITY)
Admission: EM | Admit: 2019-03-08 | Discharge: 2019-03-12 | DRG: 065 | Disposition: A | Discharge status: Rehabilitation Facility | Payer: Medicare Other | Attending: Neurology | Admitting: Neurology

## 2019-03-08 ENCOUNTER — Encounter (HOSPITAL_COMMUNITY): Payer: Self-pay | Admitting: Emergency Medicine

## 2019-03-08 ENCOUNTER — Inpatient Hospital Stay (HOSPITAL_COMMUNITY): Payer: Medicare Other

## 2019-03-08 DIAGNOSIS — R110 Nausea: Secondary | ICD-10-CM | POA: Diagnosis not present

## 2019-03-08 DIAGNOSIS — I613 Nontraumatic intracerebral hemorrhage in brain stem: Secondary | ICD-10-CM | POA: Diagnosis not present

## 2019-03-08 DIAGNOSIS — M542 Cervicalgia: Secondary | ICD-10-CM | POA: Diagnosis not present

## 2019-03-08 DIAGNOSIS — G453 Amaurosis fugax: Secondary | ICD-10-CM | POA: Diagnosis not present

## 2019-03-08 DIAGNOSIS — Z9049 Acquired absence of other specified parts of digestive tract: Secondary | ICD-10-CM

## 2019-03-08 DIAGNOSIS — E876 Hypokalemia: Secondary | ICD-10-CM | POA: Diagnosis present

## 2019-03-08 DIAGNOSIS — K5901 Slow transit constipation: Secondary | ICD-10-CM | POA: Diagnosis not present

## 2019-03-08 DIAGNOSIS — M25559 Pain in unspecified hip: Secondary | ICD-10-CM | POA: Diagnosis not present

## 2019-03-08 DIAGNOSIS — I10 Essential (primary) hypertension: Secondary | ICD-10-CM | POA: Diagnosis present

## 2019-03-08 DIAGNOSIS — I615 Nontraumatic intracerebral hemorrhage, intraventricular: Secondary | ICD-10-CM | POA: Diagnosis not present

## 2019-03-08 DIAGNOSIS — E785 Hyperlipidemia, unspecified: Secondary | ICD-10-CM | POA: Diagnosis not present

## 2019-03-08 DIAGNOSIS — W1830XA Fall on same level, unspecified, initial encounter: Secondary | ICD-10-CM | POA: Diagnosis present

## 2019-03-08 DIAGNOSIS — F0390 Unspecified dementia without behavioral disturbance: Secondary | ICD-10-CM | POA: Diagnosis present

## 2019-03-08 DIAGNOSIS — Z20828 Contact with and (suspected) exposure to other viral communicable diseases: Secondary | ICD-10-CM | POA: Diagnosis present

## 2019-03-08 DIAGNOSIS — I69319 Unspecified symptoms and signs involving cognitive functions following cerebral infarction: Secondary | ICD-10-CM | POA: Diagnosis not present

## 2019-03-08 DIAGNOSIS — R339 Retention of urine, unspecified: Secondary | ICD-10-CM | POA: Diagnosis not present

## 2019-03-08 DIAGNOSIS — Z8673 Personal history of transient ischemic attack (TIA), and cerebral infarction without residual deficits: Secondary | ICD-10-CM | POA: Diagnosis not present

## 2019-03-08 DIAGNOSIS — R7303 Prediabetes: Secondary | ICD-10-CM | POA: Diagnosis not present

## 2019-03-08 DIAGNOSIS — I610 Nontraumatic intracerebral hemorrhage in hemisphere, subcortical: Secondary | ICD-10-CM | POA: Diagnosis not present

## 2019-03-08 DIAGNOSIS — I739 Peripheral vascular disease, unspecified: Secondary | ICD-10-CM | POA: Diagnosis not present

## 2019-03-08 DIAGNOSIS — I161 Hypertensive emergency: Secondary | ICD-10-CM | POA: Diagnosis not present

## 2019-03-08 DIAGNOSIS — Y92009 Unspecified place in unspecified non-institutional (private) residence as the place of occurrence of the external cause: Secondary | ICD-10-CM | POA: Diagnosis not present

## 2019-03-08 DIAGNOSIS — I69391 Dysphagia following cerebral infarction: Secondary | ICD-10-CM | POA: Diagnosis not present

## 2019-03-08 DIAGNOSIS — E46 Unspecified protein-calorie malnutrition: Secondary | ICD-10-CM | POA: Diagnosis not present

## 2019-03-08 DIAGNOSIS — B373 Candidiasis of vulva and vagina: Secondary | ICD-10-CM | POA: Diagnosis not present

## 2019-03-08 DIAGNOSIS — I614 Nontraumatic intracerebral hemorrhage in cerebellum: Secondary | ICD-10-CM | POA: Diagnosis not present

## 2019-03-08 DIAGNOSIS — L89151 Pressure ulcer of sacral region, stage 1: Secondary | ICD-10-CM | POA: Diagnosis not present

## 2019-03-08 DIAGNOSIS — K219 Gastro-esophageal reflux disease without esophagitis: Secondary | ICD-10-CM | POA: Diagnosis not present

## 2019-03-08 DIAGNOSIS — R42 Dizziness and giddiness: Secondary | ICD-10-CM | POA: Diagnosis not present

## 2019-03-08 DIAGNOSIS — I619 Nontraumatic intracerebral hemorrhage, unspecified: Secondary | ICD-10-CM | POA: Diagnosis not present

## 2019-03-08 DIAGNOSIS — R509 Fever, unspecified: Secondary | ICD-10-CM | POA: Diagnosis not present

## 2019-03-08 DIAGNOSIS — B952 Enterococcus as the cause of diseases classified elsewhere: Secondary | ICD-10-CM

## 2019-03-08 DIAGNOSIS — R0989 Other specified symptoms and signs involving the circulatory and respiratory systems: Secondary | ICD-10-CM | POA: Diagnosis not present

## 2019-03-08 DIAGNOSIS — S3992XA Unspecified injury of lower back, initial encounter: Secondary | ICD-10-CM | POA: Diagnosis not present

## 2019-03-08 DIAGNOSIS — E8809 Other disorders of plasma-protein metabolism, not elsewhere classified: Secondary | ICD-10-CM | POA: Diagnosis not present

## 2019-03-08 DIAGNOSIS — D72819 Decreased white blood cell count, unspecified: Secondary | ICD-10-CM | POA: Diagnosis not present

## 2019-03-08 DIAGNOSIS — R29818 Other symptoms and signs involving the nervous system: Secondary | ICD-10-CM | POA: Diagnosis not present

## 2019-03-08 DIAGNOSIS — H3530 Unspecified macular degeneration: Secondary | ICD-10-CM | POA: Diagnosis present

## 2019-03-08 DIAGNOSIS — N390 Urinary tract infection, site not specified: Secondary | ICD-10-CM | POA: Diagnosis present

## 2019-03-08 DIAGNOSIS — R9431 Abnormal electrocardiogram [ECG] [EKG]: Secondary | ICD-10-CM | POA: Diagnosis not present

## 2019-03-08 DIAGNOSIS — G479 Sleep disorder, unspecified: Secondary | ICD-10-CM | POA: Diagnosis not present

## 2019-03-08 DIAGNOSIS — I340 Nonrheumatic mitral (valve) insufficiency: Secondary | ICD-10-CM | POA: Diagnosis not present

## 2019-03-08 DIAGNOSIS — Z03818 Encounter for observation for suspected exposure to other biological agents ruled out: Secondary | ICD-10-CM | POA: Diagnosis not present

## 2019-03-08 DIAGNOSIS — R000 Tachycardia, unspecified: Secondary | ICD-10-CM | POA: Diagnosis not present

## 2019-03-08 DIAGNOSIS — I951 Orthostatic hypotension: Secondary | ICD-10-CM | POA: Diagnosis not present

## 2019-03-08 DIAGNOSIS — R0602 Shortness of breath: Secondary | ICD-10-CM | POA: Diagnosis not present

## 2019-03-08 DIAGNOSIS — L8990 Pressure ulcer of unspecified site, unspecified stage: Secondary | ICD-10-CM | POA: Diagnosis present

## 2019-03-08 DIAGNOSIS — I616 Nontraumatic intracerebral hemorrhage, multiple localized: Secondary | ICD-10-CM | POA: Diagnosis not present

## 2019-03-08 DIAGNOSIS — I6523 Occlusion and stenosis of bilateral carotid arteries: Secondary | ICD-10-CM | POA: Diagnosis not present

## 2019-03-08 DIAGNOSIS — I629 Nontraumatic intracranial hemorrhage, unspecified: Secondary | ICD-10-CM | POA: Diagnosis not present

## 2019-03-08 HISTORY — DX: Unspecified glaucoma: H40.9

## 2019-03-08 HISTORY — DX: Other symptoms and signs involving cognitive functions and awareness: R41.89

## 2019-03-08 HISTORY — DX: Cerebral infarction, unspecified: I63.9

## 2019-03-08 HISTORY — DX: Amaurosis fugax: G45.3

## 2019-03-08 HISTORY — DX: Anxiety disorder, unspecified: F41.9

## 2019-03-08 HISTORY — DX: Unspecified macular degeneration: H35.30

## 2019-03-08 HISTORY — DX: Psychophysical visual disturbances: H53.16

## 2019-03-08 HISTORY — DX: Unspecified cataract: H26.9

## 2019-03-08 LAB — I-STAT CHEM 8, ED
BUN: 9 mg/dL (ref 8–23)
Calcium, Ion: 1.18 mmol/L (ref 1.15–1.40)
Chloride: 105 mmol/L (ref 98–111)
Creatinine, Ser: 0.5 mg/dL (ref 0.44–1.00)
Glucose, Bld: 179 mg/dL — ABNORMAL HIGH (ref 70–99)
HCT: 40 % (ref 36.0–46.0)
Hemoglobin: 13.6 g/dL (ref 12.0–15.0)
Potassium: 3.2 mmol/L — ABNORMAL LOW (ref 3.5–5.1)
Sodium: 140 mmol/L (ref 135–145)
TCO2: 24 mmol/L (ref 22–32)

## 2019-03-08 LAB — COMPREHENSIVE METABOLIC PANEL
ALT: 16 U/L (ref 0–44)
AST: 21 U/L (ref 15–41)
Albumin: 3.9 g/dL (ref 3.5–5.0)
Alkaline Phosphatase: 64 U/L (ref 38–126)
Anion gap: 11 (ref 5–15)
BUN: 10 mg/dL (ref 8–23)
CO2: 22 mmol/L (ref 22–32)
Calcium: 9.2 mg/dL (ref 8.9–10.3)
Chloride: 106 mmol/L (ref 98–111)
Creatinine, Ser: 0.66 mg/dL (ref 0.44–1.00)
GFR calc Af Amer: >60 mL/min (ref >60)
GFR calc non Af Amer: >60 mL/min (ref >60)
Glucose, Bld: 179 mg/dL — ABNORMAL HIGH (ref 70–99)
Potassium: 3.1 mmol/L — ABNORMAL LOW (ref 3.5–5.1)
Sodium: 139 mmol/L (ref 135–145)
Total Bilirubin: 0.2 mg/dL — ABNORMAL LOW (ref 0.3–1.2)
Total Protein: 6.8 g/dL (ref 6.5–8.1)

## 2019-03-08 LAB — CBC
HCT: 39.7 % (ref 36.0–46.0)
Hemoglobin: 13 g/dL (ref 12.0–15.0)
MCH: 32.8 pg (ref 26.0–34.0)
MCHC: 32.7 g/dL (ref 30.0–36.0)
MCV: 100.3 fL — ABNORMAL HIGH (ref 80.0–100.0)
Platelets: 226 10*3/uL (ref 150–400)
RBC: 3.96 MIL/uL (ref 3.87–5.11)
RDW: 12.9 % (ref 11.5–15.5)
WBC: 7.2 10*3/uL (ref 4.0–10.5)
nRBC: 0 % (ref 0.0–0.2)

## 2019-03-08 LAB — DIFFERENTIAL
Abs Immature Granulocytes: 0.06 10*3/uL (ref 0.00–0.07)
Basophils Absolute: 0 10*3/uL (ref 0.0–0.1)
Basophils Relative: 0 %
Eosinophils Absolute: 0.2 10*3/uL (ref 0.0–0.5)
Eosinophils Relative: 3 %
Immature Granulocytes: 1 %
Lymphocytes Relative: 44 %
Lymphs Abs: 3.1 10*3/uL (ref 0.7–4.0)
Monocytes Absolute: 0.7 10*3/uL (ref 0.1–1.0)
Monocytes Relative: 9 %
Neutro Abs: 3.1 10*3/uL (ref 1.7–7.7)
Neutrophils Relative %: 43 %

## 2019-03-08 LAB — APTT: aPTT: 29 seconds (ref 24–36)

## 2019-03-08 LAB — PROTIME-INR
INR: 1.1 (ref 0.8–1.2)
Prothrombin Time: 13.6 seconds (ref 11.4–15.2)

## 2019-03-08 LAB — ETHANOL: Alcohol, Ethyl (B): <10 mg/dL (ref <10)

## 2019-03-08 MED ORDER — ACETAMINOPHEN 160 MG/5ML PO SOLN: 650.0000 mg | ORAL | Status: DC | PRN

## 2019-03-08 MED ORDER — ACETAMINOPHEN 650 MG RE SUPP: 650.0000 mg | RECTAL | Status: DC | PRN

## 2019-03-08 MED ORDER — POTASSIUM CHLORIDE 10 MEQ/100ML IV SOLN
10.0000 meq | INTRAVENOUS | Status: AC
  Administered 2019-03-08 – 2019-03-09 (×3): 10 meq via INTRAVENOUS
  Filled 2019-03-08 (×3): qty 100

## 2019-03-08 MED ORDER — LABETALOL HCL 5 MG/ML IV SOLN
10.0000 mg | Freq: Once | INTRAVENOUS | Status: AC
  Administered 2019-03-08: 10 mg via INTRAVENOUS

## 2019-03-08 MED ORDER — METOCLOPRAMIDE HCL 5 MG/ML IJ SOLN
10.0000 mg | Freq: Once | INTRAMUSCULAR | Status: AC
  Administered 2019-03-08: 10 mg via INTRAVENOUS
  Filled 2019-03-08: qty 2

## 2019-03-08 MED ORDER — SENNOSIDES-DOCUSATE SODIUM 8.6-50 MG PO TABS
1.0000 | ORAL_TABLET | Freq: Two times a day (BID) | ORAL | Status: DC
  Administered 2019-03-09 – 2019-03-12 (×5): 1 via ORAL
  Filled 2019-03-08 (×6): qty 1

## 2019-03-08 MED ORDER — CLEVIDIPINE BUTYRATE 0.5 MG/ML IV EMUL
0.0000 mg/h | INTRAVENOUS | Status: DC
  Administered 2019-03-08: 1 mg/h via INTRAVENOUS
  Administered 2019-03-09: 4 mg/h via INTRAVENOUS
  Filled 2019-03-08 (×2): qty 50

## 2019-03-08 MED ORDER — ONDANSETRON HCL 4 MG/2ML IJ SOLN
4.0000 mg | Freq: Once | INTRAMUSCULAR | Status: AC
  Administered 2019-03-08: 4 mg via INTRAVENOUS

## 2019-03-08 MED ORDER — ACETAMINOPHEN 325 MG PO TABS
650.0000 mg | ORAL_TABLET | ORAL | Status: DC | PRN
  Administered 2019-03-10 (×4): 650 mg via ORAL
  Filled 2019-03-08 (×4): qty 2

## 2019-03-08 MED ORDER — IOHEXOL 350 MG/ML SOLN
75.0000 mL | Freq: Once | INTRAVENOUS | Status: AC | PRN
  Administered 2019-03-08: 75 mL via INTRAVENOUS

## 2019-03-08 MED ORDER — PANTOPRAZOLE SODIUM 40 MG IV SOLR
40.0000 mg | Freq: Every day | INTRAVENOUS | Status: DC
  Administered 2019-03-08 – 2019-03-09 (×2): 40 mg via INTRAVENOUS
  Filled 2019-03-08 (×2): qty 40

## 2019-03-08 MED ORDER — CLEVIDIPINE BUTYRATE 0.5 MG/ML IV EMUL
INTRAVENOUS | Status: AC
  Filled 2019-03-08: qty 50

## 2019-03-08 MED ORDER — STROKE: EARLY STAGES OF RECOVERY BOOK
Freq: Once | Status: AC
  Administered 2019-03-08: 22:00:00
  Filled 2019-03-08: qty 1

## 2019-03-08 MED ORDER — ONDANSETRON HCL 4 MG/2ML IJ SOLN
INTRAMUSCULAR | Status: AC
  Filled 2019-03-08: qty 2

## 2019-03-08 MED ORDER — CHLORHEXIDINE GLUCONATE CLOTH 2 % EX PADS
6.0000 | MEDICATED_PAD | Freq: Every day | CUTANEOUS | Status: DC
  Administered 2019-03-08 – 2019-03-12 (×5): 6 via TOPICAL

## 2019-03-08 NOTE — H&P (Signed)
Neurology H&P  CC: Right gaze   History is obtained from:patient  HPI: Beth Foster is a 85 y.o. female with a history of hypertension who presents with unsteady gait and right gaze deviation started sometime in the past 2 days.  She states that she got up around 3 AM and made some oats and she felt normal then, but is very unclear about when this exactly started after that.  She was found by family today brought into the emergency department where CT revealed a small cerebellar peduncle hemorrhage.   Other MRN: [id number]  LKW: 3 AM tpa given?: no, ICH ICH Score: 2   ROS: A 14 point ROS was performed and is negative except as noted in the HPI.    PMHx: Stroke Hypertension GERD  Outpatient Meds: Fosamax Dilt-XR 240mg  daily  FHx: Mother - cancer  Social History:  Denies smoking or alcohol  Exam: Current vital signs: BP (!) 164/96   Pulse 86   Resp 16   SpO2 95%  Vital signs in last 24 hours: Pulse Rate:  [75-94] 86 (10/16 2002) Resp:  [16-21] 16 (10/16 2002) BP: (144-188)/(80-110) 164/96 (10/16 2002) SpO2:  [92 %-97 %] 95 % (10/16 2002)  Physical Exam  Constitutional: Appears well-developed and well-nourished.  Psych: Affect appropriate to situation Eyes: No scleral injection HENT: No OP obstrucion Head: Normocephalic.  Cardiovascular: Normal rate and regular rhythm.  Respiratory: Effort normal and breath sounds normal to anterior ascultation GI: Soft.  No distension. There is no tenderness.  Skin: WDI  Neuro: Mental Status: Patient is awake, alert, oriented to person, place, does not give the month or year No signs of aphasia  Though she does have a forced gaze palsy, she does not extinguish to double simultaneous stimulation or show other signs of neglect. Cranial Nerves: II: Visual Fields are full. Pupils are equal, round, and reactive to light.   III,IV, VI: Right gaze deviation, cannot cross midline to the left V: Facial sensation is symmetric to  temperature VII: Facial movement is symmetric.  VIII: hearing is intact to voice X: Uvula elevates symmetrically XI: Shoulder shrug is symmetric. XII: tongue is midline without atrophy or fasciculations.  Motor: Tone is normal. Bulk is normal.  She is limited bilateral lower extremities due to sacral pain, she has mild left arm drift as well, though grips are equal.  Sensory: Sensation is symmetric to light touch and temperature in the arms and legs. Cerebellar: FNF and HKS are intact on the right, ataxic on the left.    I have reviewed labs in epic and the pertinent results are: K 3.1 CBC - unremarkable  I have reviewed the images obtained: CT - 2.3 cm cerebellar hematoma  Primary Diagnosis:  Intracranial Hemorrhage   Secondary Diagnosis: Hypertension Emergency (SBP > 180 or DBP > 120 & end organ damage) and Hypokalemia   Impression: 85 yo F with cerebellar hematoma, likely hypertensive. She will be admitted to the ICU for aggressive BP control.   Recommendations: 1) Admit to ICU 2) no antiplatelets or anticoagulants 3) blood pressure control with goal systolic [id number] - [id number] 4) Frequent neuro checks 5) If symptoms worsen or there is decreased mental status, repeat stat head CT 6) PT,OT,ST 7) IV K, 44mEq x 3 runs  This patient is critically ill and at significant risk of neurological worsening, death and care requires constant monitoring of vital signs, hemodynamics,respiratory and cardiac monitoring, neurological assessment, discussion with family, other specialists and medical decision making of high  complexity. I spent 50 minutes of neurocritical care time  in the care of  this patient. This was time spent independent of any time provided by nurse practitioner or PA.  Roland Rack, MD [facility] [phone]  If 7pm- 7am, please page neurology on call as listed in [place]. 03/08/2019  8:09 PM

## 2019-03-08 NOTE — ED Notes (Signed)
Returned from CT scan , patient placed on a monitor and pulse oximetry , IV sites intact , respirations unlabored .

## 2019-03-08 NOTE — ED Triage Notes (Signed)
Per EMS, initially called a trauma, pt fell, LSN Wed by family.  Initial GCS was 12, right sided gaze, emesis X3 by EMS. Pt is not on blood thinners, made a bowel of soup an hour ago and ate it, she was able to hit her alert button, family came and found her and called EMS.  18 L forearm, given 4mg  zofran by EMS.

## 2019-03-08 NOTE — Code Documentation (Signed)
Responded to Code Stroke called in ED at [place] for trauma pt with R sided gaze and weakness, [id number]. BP-162/81, NIH-7 for orientation, R forced gaze, and BLE weakness, CBG-179. CT head-ICH, CTA-no LVO.  10mg  labetolol and 4mg  zofran given at [place]. Cleviprex gtt started for BP control at 2003. Plan to admit to ICU under neurology service.

## 2019-03-08 NOTE — ED Provider Notes (Signed)
[facility] EMERGENCY DEPARTMENT Provider Note   CSN: [id number] Arrival date & time: 03/08/19  1910  An emergency department physician performed an initial assessment on this suspected stroke patient at 42.  History   Chief Complaint Chief Complaint  Patient presents with  . Fall  . Code Stroke    HPI Beth Foster is a 85 y.o. female BIB EMS after fall and calling life alert. LAST seen normal by family 2 days ago. Patient has a hx of dementia.  There is a level 5 caveat due to dementia and altered mental status.  According to EMS the patient was found down with a large hematoma on her head.  She appeared to have a right eye gaze deviation and confusion.  Patient was also complaining of tailbone pain.  Upon EMS arrival the patient began vomiting and had 3 episodes of vomiting up soup.  I immediately called a code stroke for emergent evaluation with consideration for potential intracranial hemorrhage. HPI  No past medical history on file.  Patient Active Problem List   Diagnosis Date Noted  . ICH (intracerebral hemorrhage) ([place]) 03/08/2019      OB History   No obstetric history on file.      Home Medications    Prior to Admission medications   Not on File    Family History No family history on file.  Social History Social History   Tobacco Use  . Smoking status: Not on file  Substance Use Topics  . Alcohol use: Not on file  . Drug use: Not on file     Allergies   Patient has no allergy information on record.   Review of Systems Review of Systems  Ten systems reviewed and are negative for acute change, except as noted in the HPI.   Physical Exam Updated Vital Signs BP (!) 178/89   Pulse 94   Resp 16   SpO2 93%   Physical Exam Vitals signs and nursing note reviewed.  Constitutional:      General: She is not in acute distress.    Appearance: She is well-developed. She is not diaphoretic.     Comments: C-collar in place  vomitus over the left clavicle  HENT:     Head: Normocephalic.  Eyes:     General: No scleral icterus.    Conjunctiva/sclera: Conjunctivae normal.     Pupils: Pupils are equal, round, and reactive to light.     Comments: Rightward eye deviation with persistent right-sided nystagmus  Neck:     Musculoskeletal: Normal range of motion.  Cardiovascular:     Rate and Rhythm: Normal rate and regular rhythm.     Heart sounds: Normal heart sounds. No murmur. No friction rub. No gallop.   Pulmonary:     Effort: Pulmonary effort is normal. No respiratory distress.     Breath sounds: Normal breath sounds.  Abdominal:     General: Bowel sounds are normal. There is no distension.     Palpations: Abdomen is soft. There is no mass.     Tenderness: There is no abdominal tenderness. There is no guarding.  Musculoskeletal:     Comments: No midline spinal tenderness in the thoracic or lumbar region.  She is tender over the sacrum.  Skin:    General: Skin is warm and dry.  Neurological:     Mental Status: She is alert. She is disoriented.     Cranial Nerves: No cranial nerve deficit.     Sensory:  No sensory deficit.     Motor: No weakness.     Comments: Patient speech is clear, goal oriented.  Able to follow two-step commands.  Patient is somewhat somnolent but easily arousable to voice. She appears to have equal bilateral upper extremity strength.  She seems to be able to differentiate when touching both sides with light touch does not apparently have neglect. She has a normal finger-to-nose.  DTRs normal without myoclonus.  Psychiatric:        Behavior: Behavior normal.      ED Treatments / Results  Labs (all labs ordered are listed, but only abnormal results are displayed) Labs Reviewed  CBC - Abnormal; Notable for the following components:      Result Value   MCV 100.3 (*)    All other components within normal limits  COMPREHENSIVE METABOLIC PANEL - Abnormal; Notable for the following  components:   Potassium 3.1 (*)    Glucose, Bld 179 (*)    Total Bilirubin 0.2 (*)    All other components within normal limits  I-STAT CHEM 8, ED - Abnormal; Notable for the following components:   Potassium 3.2 (*)    Glucose, Bld 179 (*)    All other components within normal limits  MRSA PCR SCREENING  SARS CORONAVIRUS 2 (TAT 6-24 HRS)  ETHANOL  PROTIME-INR  APTT  DIFFERENTIAL  RAPID URINE DRUG SCREEN, HOSP PERFORMED  URINALYSIS, ROUTINE W REFLEX MICROSCOPIC    EKG None  Radiology Ct Head Code Stroke Wo Contrast  Result Date: 03/08/2019 CLINICAL DATA:  Code stroke. Focal neurological deficit. Right-sided weakness. EXAM: CT HEAD WITHOUT CONTRAST TECHNIQUE: Contiguous axial images were obtained from the base of the skull through the vertex without intravenous contrast. COMPARISON:  12/27/2017 FINDINGS: Brain: There is acute hemorrhage in the left cerebellum measuring 16 x 12 x 12 mm. Volume 2.3 cc. Mild surrounding edema. Elsewhere, the brain shows age related atrophy with chronic small-vessel ischemic changes throughout the white matter. Old small vessel infarctions affect the thalami and basal ganglia. No sign of acute cortical infarction. No mass lesion, hydrocephalus or extra-axial collection. Vascular: There is atherosclerotic calcification of the major vessels at the base of the brain. Skull: Negative Sinuses/Orbits: Clear/normal Other: None ASPECTS ([place] Stroke Program Early CT Score) - Ganglionic level infarction (caudate, lentiform nuclei, internal capsule, insula, M1-M3 cortex): 7 - Supraganglionic infarction (M4-M6 cortex): 3 Total score (0-10 with 10 being normal): 10 IMPRESSION: 1. 2.3 cc acute hematoma within the left cerebellum with mild surrounding edema. No intraventricular or subarachnoid penetration. Elsewhere, atrophy an extensive chronic small-vessel ischemic changes. 2. ASPECTS is 10. 3. These results were communicated to Dr. Leonel Ramsay at 7:39 pmon 10/16/2020by  text page via the [facility] messaging system. Electronically Signed   By: Nelson Chimes M.D.   On: 03/08/2019 19:42    Procedures .Critical Care Performed by: Margarita Mail, PA-C Authorized by: Margarita Mail, PA-C   Critical care provider statement:    Critical care time (minutes):  50   Critical care was time spent personally by me on the following activities:  Discussions with consultants, evaluation of patient's response to treatment, examination of patient, ordering and performing treatments and interventions, ordering and review of laboratory studies, ordering and review of radiographic studies, pulse oximetry, re-evaluation of patient's condition, obtaining history from patient or surrogate and review of old charts   (including critical care time)  Medications Ordered in ED Medications   stroke: mapping our early stages of recovery book (has no administration  in time range)  acetaminophen (TYLENOL) tablet 650 mg (has no administration in time range)    Or  acetaminophen (TYLENOL) solution 650 mg (has no administration in time range)    Or  acetaminophen (TYLENOL) suppository 650 mg (has no administration in time range)  senna-docusate (Senokot-S) tablet 1 tablet (has no administration in time range)  pantoprazole (PROTONIX) injection 40 mg (has no administration in time range)  clevidipine (CLEVIPREX) infusion 0.5 mg/mL (has no administration in time range)  ondansetron (ZOFRAN) injection 4 mg (4 mg Intravenous Given 03/08/19 1935)  labetalol (NORMODYNE) injection 10 mg (10 mg Intravenous Given 03/08/19 1930)  iohexol (OMNIPAQUE) 350 MG/ML injection 75 mL (75 mLs Intravenous Contrast Given 03/08/19 1939)     Initial Impression / Assessment and Plan / ED Course  I have reviewed the triage vital signs and the nursing notes.  Pertinent labs & imaging results that were available during my care of the patient were reviewed by me and considered in my medical decision making (see  chart for details).  Clinical Course as of Mar 07 2025  Fri Mar 08, 2019  2022 Potassium(!): 3.2 [AH]  2022 Glucose(!): 179 [AH]  2023 C spine is clear    [AH]    Clinical Course User Index [AH] Margarita Mail, PA-C       [id number] Altered mental status VS:  Vitals:   03/08/19 2115 03/08/19 2200 03/08/19 2230 03/08/19 2300  BP: 128/62 (!) 98/46 (!) 105/53 117/65  Pulse: 90 87 88 90  Resp: 18 19 18 19   SpO2: 91% 93% 92% 93%  Weight:      Height:       [id number] is gathered by EMS and EMR. DDX:The differential diagnosis for AMS is extensive and includes, but is not limited to: drug overdose - opioids, alcohol, sedatives, antipsychotics, drug withdrawal, others; Metabolic: hypoxia, hypoglycemia, hyperglycemia, hypercalcemia, hypernatremia, hyponatremia, uremia, hepatic encephalopathy, hypothyroidism, hyperthyroidism, vitamin B12 or thiamine deficiency, carbon monoxide poisoning, Wilson's disease, Lactic acidosis, DKA/HHOS; Infectious: meningitis, encephalitis, bacteremia/sepsis, urinary tract infection, pneumonia, neurosyphilis; Structural: Space-occupying lesion, (brain tumor, subdural hematoma, hydrocephalus,); Vascular: stroke, subarachnoid hemorrhage, coronary ischemia, hypertensive encephalopathy, CNS vasculitis, thrombotic thrombocytopenic purpura, disseminated intravascular coagulation, hyperviscosity; Psychiatric: Schizophrenia, depression; Other: Seizure, hypothermia, heat stroke, ICU psychosis, dementia -"sundowning."  Labs: I reviewed the labs which show mild hypokalemia, elevated blood glucose, normal ethanol level, normal coagulation studies, CBC shows macrocytosis without anemia or leukocytosis. Imaging: I personally reviewed the images (CT, CT angiogram head and neck, portable pelvic x-ray) which show(s) intracranial hemorrhage in the cerebellum, pelvis without acute pathology, fracture. MDM: I reviewed all data points and imaging for [facility].  The large differential  diagnosis for this patient makes medical decision making of high complexity.  Patient's altered mental status is most consistent with acute intracranial hemorrhage causing fall.  Patient's presentation is not consistent with metabolic encephalopathy, hypoxia, hyperglycemia, other metabolic abnormality.  Patient does not appear to have ingestion or polypharmacy.  Similarly the patient does not appear to have a psychiatric cause of her altered mentation including advanced dementia or psychosis.. Patient disposition: Admission to the neurology service. Patient condition: Serious. The patient appears reasonably stabilized for admission considering the current resources, flow, and capabilities available in the ED at this time, and I doubt any other [facility] requiring further screening and/or treatment in the ED prior to admission.   Final Clinical Impressions(s) / ED Diagnoses   Final diagnoses:  Neck pain    ED Discharge Orders    None  Margarita Mail, PA-C 03/09/19 [id number]    Julianne Rice, MD 03/09/19 [phone]

## 2019-03-08 NOTE — ED Notes (Signed)
Dentures taken out, placed in labled container

## 2019-03-09 ENCOUNTER — Inpatient Hospital Stay (HOSPITAL_COMMUNITY): Payer: Medicare Other

## 2019-03-09 ENCOUNTER — Other Ambulatory Visit: Payer: Self-pay

## 2019-03-09 DIAGNOSIS — L8990 Pressure ulcer of unspecified site, unspecified stage: Secondary | ICD-10-CM | POA: Diagnosis present

## 2019-03-09 DIAGNOSIS — I340 Nonrheumatic mitral (valve) insufficiency: Secondary | ICD-10-CM

## 2019-03-09 LAB — RAPID URINE DRUG SCREEN, HOSP PERFORMED
Amphetamines: NOT DETECTED
Barbiturates: NOT DETECTED
Benzodiazepines: NOT DETECTED
Cocaine: NOT DETECTED
Opiates: NOT DETECTED
Tetrahydrocannabinol: NOT DETECTED

## 2019-03-09 LAB — URINALYSIS, ROUTINE W REFLEX MICROSCOPIC
Bilirubin Urine: NEGATIVE
Glucose, UA: 50 mg/dL — AB
Hgb urine dipstick: NEGATIVE
Ketones, ur: NEGATIVE mg/dL
Nitrite: NEGATIVE
Protein, ur: NEGATIVE mg/dL
Specific Gravity, Urine: 1.024 (ref 1.005–1.030)
pH: 7 (ref 5.0–8.0)

## 2019-03-09 LAB — MRSA PCR SCREENING: MRSA by PCR: NEGATIVE

## 2019-03-09 LAB — CBC
HCT: 41.9 % (ref 36.0–46.0)
Hemoglobin: 13.8 g/dL (ref 12.0–15.0)
MCH: 32.4 pg (ref 26.0–34.0)
MCHC: 32.9 g/dL (ref 30.0–36.0)
MCV: 98.4 fL (ref 80.0–100.0)
Platelets: 203 10*3/uL (ref 150–400)
RBC: 4.26 MIL/uL (ref 3.87–5.11)
RDW: 13.1 % (ref 11.5–15.5)
WBC: 8.9 10*3/uL (ref 4.0–10.5)
nRBC: 0 % (ref 0.0–0.2)

## 2019-03-09 LAB — BASIC METABOLIC PANEL
Anion gap: 11 (ref 5–15)
BUN: 7 mg/dL — ABNORMAL LOW (ref 8–23)
CO2: 22 mmol/L (ref 22–32)
Calcium: 9 mg/dL (ref 8.9–10.3)
Chloride: 104 mmol/L (ref 98–111)
Creatinine, Ser: 0.71 mg/dL (ref 0.44–1.00)
GFR calc Af Amer: >60 mL/min (ref >60)
GFR calc non Af Amer: >60 mL/min (ref >60)
Glucose, Bld: 179 mg/dL — ABNORMAL HIGH (ref 70–99)
Potassium: 3.7 mmol/L (ref 3.5–5.1)
Sodium: 137 mmol/L (ref 135–145)

## 2019-03-09 LAB — SARS CORONAVIRUS 2 (TAT 6-24 HRS): SARS Coronavirus 2: NEGATIVE

## 2019-03-09 LAB — ECHOCARDIOGRAM COMPLETE
Height: 65 in
Weight: 2250.46 oz

## 2019-03-09 LAB — HEMOGLOBIN A1C
Hgb A1c MFr Bld: 5.7 % — ABNORMAL HIGH (ref 4.8–5.6)
Mean Plasma Glucose: 116.89 mg/dL

## 2019-03-09 MED ORDER — ONDANSETRON HCL 4 MG/2ML IJ SOLN
4.0000 mg | Freq: Four times a day (QID) | INTRAMUSCULAR | Status: DC | PRN
  Administered 2019-03-09: 4 mg via INTRAVENOUS
  Filled 2019-03-09 (×2): qty 2

## 2019-03-09 MED ORDER — SULFAMETHOXAZOLE-TRIMETHOPRIM 400-80 MG PO TABS
1.0000 | ORAL_TABLET | Freq: Two times a day (BID) | ORAL | Status: AC
  Administered 2019-03-09 – 2019-03-11 (×6): 1 via ORAL
  Filled 2019-03-09 (×7): qty 1

## 2019-03-09 MED ORDER — SODIUM CHLORIDE 0.9 % IV SOLN
INTRAVENOUS | Status: DC
  Administered 2019-03-09 – 2019-03-12 (×4): via INTRAVENOUS

## 2019-03-09 MED ORDER — POTASSIUM CHLORIDE 10 MEQ/100ML IV SOLN: 10.0000 meq | INTRAVENOUS | Status: DC

## 2019-03-09 MED ORDER — POTASSIUM CHLORIDE CRYS ER 20 MEQ PO TBCR: 20.0000 meq | EXTENDED_RELEASE_TABLET | Freq: Three times a day (TID) | ORAL | Status: DC

## 2019-03-09 NOTE — Progress Notes (Signed)
  Echocardiogram 2D Echocardiogram has been performed.  Beth Foster 03/09/2019, 4:51 PM

## 2019-03-09 NOTE — Progress Notes (Signed)
STROKE TEAM PROGRESS NOTE   INTERVAL HISTORY Her RN is at the bedside.  Pt lethargic and vomited once during round. She passed swallow however, no appetite at all. She is able to tell me her first name and age but "I don not know" for other orientation questions. No obvious ataxia and moving all extremities equally. CT repeat showed mild extension of cerebellar peduncle ICH. BP 140s on cleviprex. Will need to aggressive BP control and repeat CT at 6pm.    OBJECTIVE Vitals:   03/09/19 0500 03/09/19 0600 03/09/19 0700 03/09/19 0800  BP: 110/61 111/60 121/68 130/69  Pulse: 94 96 99 (!) 106  Resp: (!) 29 20 17 17   Temp:    98.2 F (36.8 C)  TempSrc:      SpO2: 92% 96% 96% 95%  Weight:    63.8 kg  Height:    5\' 5"  (1.651 m)    CBC:  Recent Labs  Lab 03/08/19 1917 03/08/19 1939  WBC 7.2  --   NEUTROABS 3.1  --   HGB 13.0 13.6  HCT 39.7 40.0  MCV 100.3*  --   PLT 226  --     Basic Metabolic Panel:  Recent Labs  Lab 03/08/19 1917 03/08/19 1939  NA 139 140  K 3.1* 3.2*  CL 106 105  CO2 22  --   GLUCOSE 179* 179*  BUN 10 9  CREATININE 0.66 0.50  CALCIUM 9.2  --     Lipid Panel: No results found for: CHOL, TRIG, HDL, CHOLHDL, VLDL, LDLCALC HgbA1c: No results found for: HGBA1C Urine Drug Screen:     Component Value Date/Time   LABOPIA NONE DETECTED 03/09/2019 0039   COCAINSCRNUR NONE DETECTED 03/09/2019 0039   LABBENZ NONE DETECTED 03/09/2019 0039   AMPHETMU NONE DETECTED 03/09/2019 0039   THCU NONE DETECTED 03/09/2019 0039   LABBARB NONE DETECTED 03/09/2019 0039    Alcohol Level     Component Value Date/Time   ETH <10 03/08/2019 1917    IMAGING  Ct Angio Head W Or Wo Contrast Ct Angio Neck W And/or Wo Contrast 03/08/2019 IMPRESSION:  1. Mild atherosclerotic disease at both carotid bifurcations but no stenosis or irregularity.  2. Tortuosity and dilatation of the supraclinoid ICA on the right measuring 8 mm and on the left 7 mm.  3. No intracranial large  or medium vessel occlusion or correctable proximal stenosis.  4. There is a small enhancing vessel in the region of the left cerebellar hematoma, but this is not definitely significant.   Dg Sacrum/coccyx 03/09/2019 IMPRESSION:  Limited evaluation as excreted IV contrast in the urinary bladder obscures the AP views. Allowing for this, no evidence of sacral or coccygeal fracture.   Dg Pelvis Portable 03/08/2019 IMPRESSION:  No acute osseous abnormality identified. Osseous structures are diffusely demineralized. If a high clinical suspicion for occult fracture persists, MRI could be considered.  Ct C-spine No Charge 03/08/2019 IMPRESSION:  Multilevel degenerative disc disease and facet hypertrophy throughout the cervical spine without acute osseous abnormality.   Ct Head Code Stroke Wo Contrast 03/08/2019 1. 2.3 cc acute hematoma within the left cerebellum with mild surrounding edema. No intraventricular or subarachnoid penetration. Elsewhere, atrophy an extensive chronic small-vessel ischemic changes.  2. ASPECTS is 10.   03/09/2019 Doubling of the size of a left cerebellar intraparenchymal hematoma, now measuring 2.4 x 1.6 x 1.3 cm for a volume of 5 CC. Intraventricular penetration into the fourth ventricle and cerebral aqueduct. Small amount of blood within the  occipital horns of the lateral ventricles. In retrospect, tiny focus of contrast density on the CT angiography was probably an indication of ongoing bleeding.  Ct Head Wo Contrast - pending  ECG - SR rate 82 BPM. (See cardiology reading for complete details)    PHYSICAL EXAM  Temp:  [97.9 F (36.6 C)-98.2 F (36.8 C)] 98.2 F (36.8 C) (10/17 0800) Pulse Rate:  [75-114] 91 (10/17 1000) Resp:  [16-29] 17 (10/17 1000) BP: (98-188)/(46-110) 135/74 (10/17 1000) SpO2:  [90 %-100 %] 93 % (10/17 1000) Weight:  [63.8 kg-70 kg] 63.8 kg (10/17 0800)  General - Well nourished, well developed, very lethargic and  nausea.  Ophthalmologic - fundi not visualized due to noncooperation.  Cardiovascular - Regular rhythm and rate.  Neuro - lethargic, able to open eyes on voice and command, knows her first name, place and age, but not orientated to time, last name. Able to repeat but mild to moderate dysarthria. Not cooperative on naming. Right gaze preference but able to have left gaze with nystagmus. Blinking to visual threat bilaterally. No facial droop, tongue midline. BUE 3/5 and BLE 3/5, lack of effort. Sensation symmetrical, and FTN slow but no obvious ataxia bilaterally. Gait not tested.    ASSESSMENT/PLAN Beth Foster is a 85 y.o. female with history of hypertension who presents with unsteady gait and right gaze deviation started sometime in the past 2 days. She did not receive IV t-PA due to [place].  ICH - left cerebellar peduncle ICH with small amount of IVH  Code Stroke CT Head - 2.3 cc acute hematoma within the left cerebellum with mild surrounding edema.   CT head - hematoma expansion to 5cc  CTA H&N - Tortuosity and dilatation of the supraclinoid ICA on the right measuring 8 mm and on the left 7 mm.   CT repeat 6pm pending  Will consider MRI brain to rule out CAA once stable  2D Echo - pending  [employer] Virus 2 - negative  LDL - pending  HgbA1c - 5.7  UDS - negative  VTE prophylaxis - SCDs  No antithrombotic prior to admission, now on No antithrombotic  Ongoing aggressive stroke risk factor management  Therapy recommendations:  pending  Disposition:  Pending  Hypertension  Home BP meds: Diltiazem XR 240 mg daily  Stable  On cleviprex . SBP goal < 140 mm Hg  . Long-term BP goal normotensive  Hyperlipidemia  Home Lipid lowering medication: none   LDL - pending, goal < 70  Current lipid lowering medication: None   Other Stroke Risk Factors  Advanced age  Family hx stroke - not on file  Other Active Problems  Hypokalemia - 3.1->3.2 - supplemented -  recheck in AM  Possible UTI - UA with WBC 6-10, on Bactrim for 3 days  Hospital day # 1  This patient is critically ill due to [place] at posterior circulation, hypertension and at significant risk of neurological worsening, death form hematoma expansion, cerebral edema, brain herniation, seizure, hydrocephalus. This patient's care requires constant monitoring of vital signs, hemodynamics, respiratory and cardiac monitoring, review of multiple databases, neurological assessment, discussion with family, other specialists and medical decision making of high complexity. I spent 45 minutes of neurocritical care time in the care of this patient.  Beth Hawking, MD PhD Stroke Neurology 03/09/2019 11:00 AM   To contact Stroke Continuity provider, please refer to [email or web address] After hours, contact General Neurology

## 2019-03-09 NOTE — Progress Notes (Signed)
  PT Cancellation Note  Patient Details Name: OLWEN RUGG MRN: [id number] DOB: Jan 08, 1929   Cancelled Treatment:    Reason Eval/Treat Not Completed: Active bedrest order  Will monitor for increased activity orders and proceed with evaluation when appropriate.     Barry Brunner, PT      Rexanne Mano 03/09/2019, 9:03 AM

## 2019-03-09 NOTE — Progress Notes (Signed)
PT Cancellation Note  Patient Details Name: Beth Foster MRN: [id number] DOB: [birth year]   Cancelled Treatment:    Reason Eval/Treat Not Completed: Medical issues which prohibited therapy  Noted pt with some neuro decline with some incr size of hemorrhage and remains on bedrest. Plan for CT head later today. Will follow-up 03/10/19 if pt appropriate to proceed with activity.    Beth Foster, PT     Rexanne Mano 03/09/2019, 3:54 PM

## 2019-03-09 NOTE — Progress Notes (Signed)
Patient complaining of dysuria, UA with +leuks and bacteria. Will start bactrim.   Roland Rack, MD [facility] [phone]  If 7pm- 7am, please page neurology on call as listed in [place].

## 2019-03-10 ENCOUNTER — Inpatient Hospital Stay (HOSPITAL_COMMUNITY): Payer: Medicare Other

## 2019-03-10 LAB — LIPID PANEL
Cholesterol: 168 mg/dL (ref 0–200)
HDL: 70 mg/dL (ref >40)
LDL Cholesterol: 91 mg/dL (ref 0–99)
Total CHOL/HDL Ratio: 2.4 RATIO
Triglycerides: 34 mg/dL (ref <150)
VLDL: 7 mg/dL (ref 0–40)

## 2019-03-10 LAB — BASIC METABOLIC PANEL
Anion gap: 9 (ref 5–15)
BUN: 8 mg/dL (ref 8–23)
CO2: 21 mmol/L — ABNORMAL LOW (ref 22–32)
Calcium: 8.8 mg/dL — ABNORMAL LOW (ref 8.9–10.3)
Chloride: 111 mmol/L (ref 98–111)
Creatinine, Ser: 0.65 mg/dL (ref 0.44–1.00)
GFR calc Af Amer: >60 mL/min (ref >60)
GFR calc non Af Amer: >60 mL/min (ref >60)
Glucose, Bld: 118 mg/dL — ABNORMAL HIGH (ref 70–99)
Potassium: 3.6 mmol/L (ref 3.5–5.1)
Sodium: 141 mmol/L (ref 135–145)

## 2019-03-10 LAB — CBC
HCT: 38.1 % (ref 36.0–46.0)
Hemoglobin: 12.6 g/dL (ref 12.0–15.0)
MCH: 32.6 pg (ref 26.0–34.0)
MCHC: 33.1 g/dL (ref 30.0–36.0)
MCV: 98.4 fL (ref 80.0–100.0)
Platelets: 175 10*3/uL (ref 150–400)
RBC: 3.87 MIL/uL (ref 3.87–5.11)
RDW: 13.2 % (ref 11.5–15.5)
WBC: 10.3 10*3/uL (ref 4.0–10.5)
nRBC: 0 % (ref 0.0–0.2)

## 2019-03-10 LAB — MAGNESIUM: Magnesium: 2.2 mg/dL (ref 1.7–2.4)

## 2019-03-10 MED ORDER — PANTOPRAZOLE SODIUM 40 MG PO TBEC
40.0000 mg | DELAYED_RELEASE_TABLET | Freq: Every day | ORAL | Status: DC
  Administered 2019-03-11 – 2019-03-12 (×2): 40 mg via ORAL
  Filled 2019-03-10 (×2): qty 1

## 2019-03-10 MED ORDER — LABETALOL HCL 5 MG/ML IV SOLN
5.0000 mg | INTRAVENOUS | Status: DC | PRN
  Administered 2019-03-10: 20 mg via INTRAVENOUS
  Administered 2019-03-10: 10 mg via INTRAVENOUS
  Administered 2019-03-11: 20 mg via INTRAVENOUS
  Filled 2019-03-10 (×3): qty 4

## 2019-03-10 MED ORDER — HEPARIN SODIUM (PORCINE) 5000 UNIT/ML IJ SOLN
5000.0000 [IU] | Freq: Two times a day (BID) | INTRAMUSCULAR | Status: DC
  Administered 2019-03-10 – 2019-03-12 (×4): 5000 [IU] via SUBCUTANEOUS
  Filled 2019-03-10 (×4): qty 1

## 2019-03-10 NOTE — Evaluation (Signed)
Physical Therapy Evaluation Patient Details Name: Beth Foster MRN: [id number] DOB: 1929-02-20 Today's Date: 03/10/2019   History of Present Illness  85 y.o. female with a history of hypertension (and per grandaughter, h/o wet macular degeneration) who presents with unsteady gait and right gaze deviation started sometime 03/06/19 to 03/08/19 (adm 10/16). CT revealed a small left cerebellar peduncle hemorrhage; MRI 03/10/19 with no incr size of hematoma, +intraventricular penetration lateral and 4th ventricle with ventricular size stable.   Clinical Impression   Pt admitted with above diagnosis. Patient with severely impaired functional mobility compared to her baseline due to vertigo resulting from cerebellar ICH. She was able to progress to standing at EOB, but limited by incr BP (SBP 179) and vertigo. She is motivated and wants to return to living alone (although grandaughter realistic and acknowledges pt will need to live with her). Pt currently with functional limitations due to the deficits listed below (see PT Problem List). Pt will benefit from skilled PT to increase their independence and safety with mobility to allow discharge to the venue listed below.       Follow Up Recommendations CIR    Equipment Recommendations  Other (comment)(TBD if no post-acute therapies)    Recommendations for Other Services Rehab consult;OT consult     Precautions / Restrictions Precautions Precautions: Fall Precaution Comments: goal SBP <140;  Restrictions Weight Bearing Restrictions: No      Mobility  Bed Mobility Overal bed mobility: Needs Assistance Bed Mobility: Rolling;Sidelying to Sit;Sit to Sidelying Rolling: Supervision Sidelying to sit: Mod assist;HOB elevated     Sit to sidelying: Mod assist General bed mobility comments: uses rail to roll and side to sit  Transfers Overall transfer level: Needs assistance Equipment used: None Transfers: Sit to/from Stand Sit to Stand: Min  assist         General transfer comment: not fully able to stand (~3/4) with step/scoot to the [facility] (to Left) and return to sit. Pt very dizzy  Ambulation/Gait             General Gait Details: unable;will need +2  Stairs            Wheelchair Mobility    Modified Rankin (Stroke Patients Only) Modified Rankin (Stroke Patients Only) Pre-Morbid Rankin Score: Moderate disability Modified Rankin: Severe disability     Balance Overall balance assessment: Needs assistance Sitting-balance support: Bilateral upper extremity supported;Feet unsupported Sitting balance-Leahy Scale: Fair Sitting balance - Comments: close supervision, without imbalance   Standing balance support: No upper extremity supported Standing balance-Leahy Scale: Poor                               Pertinent Vitals/Pain Pain Assessment: Faces Faces Pain Scale: Hurts little more Pain Location: headache Pain Descriptors / Indicators: Headache Pain Intervention(s): Limited activity within patient's tolerance;Monitored during session;Repositioned;Premedicated before session    Home Living Family/patient expects to be discharged to:: Private residence Living Arrangements: Alone Available Help at Discharge: Family(grandaughter POA, at bedside) Type of Home: House Home Access: Stairs to enter Entrance Stairs-Rails: None Entrance Stairs-Number of Steps: 3 Home Layout: Two level;Bed/bath upstairs Home Equipment: Cane - single point;Walker - 4 wheels;Grab bars - tub/shower;Toilet riser;Shower seat Additional Comments: layout is for grandaughter's home (she plans for pt to come there)    Prior Function Level of Independence: Needs assistance   Gait / Transfers Assistance Needed: walks with cane inside (also uses furniture) and gets very SOB  with only short distances  ADL's / Homemaking Assistance Needed: has gotten to point she could not manage even microwave to re-warm meals; grandaughter  does grocery shopping, meals; pt modified independent with bathing and dressing (wears depends for incontinence)  Comments: grandaughter provided/confirmed all information; they were at point of considering moving pt to her home and plan to do so after discharge     Hand Dominance        Extremity/Trunk Assessment   Upper Extremity Assessment Upper Extremity Assessment: Defer to OT evaluation    Lower Extremity Assessment Lower Extremity Assessment: Generalized weakness;RLE deficits/detail;LLE deficits/detail(RLE slightly weaker than left; assessed in supine) RLE Sensation: decreased light touch(reports lower leg feels "bumpy" with light touch assessment) RLE Coordination: WNL LLE Coordination: WNL    Cervical / Trunk Assessment Cervical / Trunk Assessment: Kyphotic  Communication   Communication: No difficulties  Cognition Arousal/Alertness: Lethargic Behavior During Therapy: WFL for tasks assessed/performed Overall Cognitive Status: History of cognitive impairments - at baseline                                 General Comments: grandaughter had to correct some of her answers; oriented to person, DOB, hospital (not which one) but NOT situation      General Comments General comments (skin integrity, edema, etc.): Grandaughter present throughout session. Patient initially reporting dizziness as "lightheadedness" however BP did go up with side to sit and noted nystagmus. After standing, pt reported "feel like I'm moving" and nystagmus more intense. BP up to 179/70. Pt able to focus on single object and reported decreasing symptoms. Return to lying again with incr vertigo and nystagmus. BP 157/67 Incr time answering grandaughter's questions and explaining post-acute therapy options in broad terms and the multiple factors that play into where she will go.     Exercises     Assessment/Plan    PT Assessment Patient needs continued PT services  PT Problem List  Decreased strength;Decreased activity tolerance;Decreased balance;Decreased mobility;Decreased cognition;Decreased knowledge of use of DME       PT Treatment Interventions DME instruction;Gait training;Functional mobility training;Therapeutic activities;Therapeutic exercise;Balance training;Neuromuscular re-education;Cognitive remediation;Patient/family education    PT Goals (Current goals can be found in the Care Plan section)  Acute Rehab PT Goals Patient Stated Goal: to be able to go back to her home PT Goal Formulation: With patient/family Time For Goal Achievement: 03/24/19 Potential to Achieve Goals: Good    Frequency Min 4X/week   Barriers to discharge Inaccessible home environment grandaughter's home with all bedrooms upstairs    Co-evaluation               AM-PAC PT "6 Clicks" Mobility  Outcome Measure Help needed turning from your back to your side while in a flat bed without using bedrails?: A Little Help needed moving from lying on your back to sitting on the side of a flat bed without using bedrails?: A Lot Help needed moving to and from a bed to a chair (including a wheelchair)?: Total Help needed standing up from a chair using your arms (e.g., wheelchair or bedside chair)?: Total Help needed to walk in hospital room?: Total Help needed climbing 3-5 steps with a railing? : Total 6 Click Score: 9    End of Session   Activity Tolerance: Treatment limited secondary to medical complications (Comment)(vertigo) Patient left: in bed;with call bell/phone within reach;with family/visitor present Nurse Communication: Mobility status;Other (comment)(grandaughter wants to be present/contacted  by CM, SW) PT Visit Diagnosis: Unsteadiness on feet (R26.81);Muscle weakness (generalized) (M62.81);Dizziness and giddiness (R42);Other symptoms and signs involving the nervous system (R29.898)    Time: [id number] PT Time Calculation (min) (ACUTE ONLY): 48 min   Charges:   PT  Evaluation $PT Eval Moderate Complexity: 1 Mod PT Treatments $Neuromuscular Re-education: 23-37 mins          Barry Brunner, PT      [place] P Joycelin Radloff 03/10/2019, 1:12 PM

## 2019-03-10 NOTE — Progress Notes (Signed)
STROKE TEAM PROGRESS NOTE   INTERVAL HISTORY Her RN is at the bedside. Pt still lethargic but more interactive. Still complains of mild bi-frontal HA. No dizzy or nausea. But she did have two vomiting episode yesterday. Repeat CT head last night showed stable hematoma. Will do MRI brain today to rule out CAA.    OBJECTIVE Vitals:   03/10/19 0500 03/10/19 0600 03/10/19 0700 03/10/19 0800  BP: (!) 126/54 (!) 119/50 130/80 (!) 138/106  Pulse: 77 77 86 90  Resp: 17 17 11 13   Temp:    99.2 F (37.3 C)  TempSrc:      SpO2: 92% 91% 92% 93%  Weight:      Height:        CBC:  Recent Labs  Lab 03/08/19 1917  03/09/19 1021 03/10/19 0619  WBC 7.2  --  8.9 10.3  NEUTROABS 3.1  --   --   --   HGB 13.0   < > 13.8 12.6  HCT 39.7   < > 41.9 38.1  MCV 100.3*  --  98.4 98.4  PLT 226  --  203 175   < > = values in this interval not displayed.    Basic Metabolic Panel:  Recent Labs  Lab 03/09/19 1021 03/10/19 0619  NA 137 141  K 3.7 3.6  CL 104 111  CO2 22 21*  GLUCOSE 179* 118*  BUN 7* 8  CREATININE 0.71 0.65  CALCIUM 9.0 8.8*  MG  --  2.2    Lipid Panel:     Component Value Date/Time   CHOL 168 03/10/2019 0619   TRIG 34 03/10/2019 0619   HDL 70 03/10/2019 0619   CHOLHDL 2.4 03/10/2019 0619   VLDL 7 03/10/2019 0619   LDLCALC 91 03/10/2019 0619   HgbA1c:  Lab Results  Component Value Date   HGBA1C 5.7 (H) 03/09/2019   Urine Drug Screen:     Component Value Date/Time   LABOPIA NONE DETECTED 03/09/2019 0039   COCAINSCRNUR NONE DETECTED 03/09/2019 0039   LABBENZ NONE DETECTED 03/09/2019 0039   AMPHETMU NONE DETECTED 03/09/2019 0039   THCU NONE DETECTED 03/09/2019 0039   LABBARB NONE DETECTED 03/09/2019 0039    Alcohol Level     Component Value Date/Time   ETH <10 03/08/2019 1917    IMAGING  Ct Angio Head W Or Wo Contrast Ct Angio Neck W And/or Wo Contrast 03/08/2019 IMPRESSION:  1. Mild atherosclerotic disease at both carotid bifurcations but no  stenosis or irregularity.  2. Tortuosity and dilatation of the supraclinoid ICA on the right measuring 8 mm and on the left 7 mm.  3. No intracranial large or medium vessel occlusion or correctable proximal stenosis.  4. There is a small enhancing vessel in the region of the left cerebellar hematoma, but this is not definitely significant.   Dg Sacrum/coccyx 03/09/2019 IMPRESSION:  Limited evaluation as excreted IV contrast in the urinary bladder obscures the AP views. Allowing for this, no evidence of sacral or coccygeal fracture.   Dg Pelvis Portable 03/08/2019 IMPRESSION:  No acute osseous abnormality identified. Osseous structures are diffusely demineralized. If a high clinical suspicion for occult fracture persists, MRI could be considered.  Ct C-spine No Charge 03/08/2019 IMPRESSION:  Multilevel degenerative disc disease and facet hypertrophy throughout the cervical spine without acute osseous abnormality.   Ct Head Code Stroke Wo Contrast 03/08/2019 1. 2.3 cc acute hematoma within the left cerebellum with mild surrounding edema. No intraventricular or subarachnoid penetration. Elsewhere, atrophy  an extensive chronic small-vessel ischemic changes.  2. ASPECTS is 10.   03/09/2019 Doubling of the size of a left cerebellar intraparenchymal hematoma, now measuring 2.4 x 1.6 x 1.3 cm for a volume of 5 CC. Intraventricular penetration into the fourth ventricle and cerebral aqueduct. Small amount of blood within the occipital horns of the lateral ventricles. In retrospect, tiny focus of contrast density on the CT angiography was probably an indication of ongoing bleeding.  Ct Head Wo Contrast  03/09/19 IMPRESSION: 1. Hemorrhage within the medial left cerebellum and 4th ventricle is contiguous and stable from 0949 hours today. Stable trace IVH elsewhere. 2. No significant intracranial mass effect.  No ventriculomegaly. 3. No new intracranial abnormality. Underlying chronic small  vessel disease.  ECG - SR rate 82 BPM. (See cardiology reading for complete details)   Transthoracic Echocardiogram  IMPRESSIONS  1. Left ventricular ejection fraction, by visual estimation, is 55 to 60%. The left ventricle has normal function. Normal left ventricular size. There is mildly increased left ventricular hypertrophy.  2. Left ventricular diastolic Doppler parameters are consistent with impaired relaxation pattern of LV diastolic filling.  3. Global right ventricle has normal systolic function.The right ventricular size is normal. No increase in right ventricular wall thickness.  4. Left atrial size was normal.  5. Right atrial size was normal.  6. Presence of pericardial fat pad.  7. Trivial pericardial effusion is present.  8. The pericardial effusion is posterior to the left ventricle.  9. Mild mitral annular calcification. 10. The mitral valve is grossly normal. Mild mitral valve regurgitation. 11. The tricuspid valve is grossly normal. Tricuspid valve regurgitation is trivial. 12. The aortic valve is tricuspid Aortic valve regurgitation was not visualized by color flow Doppler. Mild aortic valve sclerosis without stenosis. 13. The pulmonic valve was grossly normal. Pulmonic valve regurgitation is mild by color flow Doppler.   PHYSICAL EXAM  Temp:  [98 F (36.7 C)-99.2 F (37.3 C)] 99.2 F (37.3 C) (10/18 0800) Pulse Rate:  [67-98] 90 (10/18 0800) Resp:  [11-30] 13 (10/18 0800) BP: (99-140)/(42-106) 138/106 (10/18 0800) SpO2:  [91 %-96 %] 93 % (10/18 0800)  General - Well nourished, well developed, lethargic but more interactive.  Ophthalmologic - fundi not visualized due to noncooperation.  Cardiovascular - Regular rhythm and rate.  Neuro - lethargic, able to open eyes on voice and following command, knows her name, place, month and age, but not orientated to year. Able to repeat and name but mild dysarthria. Right gaze preference resolved, able to attending to  both sides, no obvious nystagmus. Blinking to visual threat bilaterally. No facial droop, tongue midline. BUE 3/5 and BLE 3/5, effort related. Sensation symmetrical, and FTN slow but no obvious ataxia bilaterally. Gait not tested.    ASSESSMENT/PLAN Beth Foster is a 85 y.o. female with history of hypertension who presents with unsteady gait and right gaze deviation started sometime in the past 2 days. She did not receive IV t-PA due to [place].  ICH - left cerebellar peduncle ICH with small amount of IVH  Code Stroke CT Head - 2.3 cc acute hematoma within the left cerebellum with mild surrounding edema.   CT head - hematoma expansion to 5cc  CTA H&N - Tortuosity and dilatation of the supraclinoid ICA on the right measuring 8 mm and on the left 7 mm.   CT repeat 6pm 10/17 - stable hematoma  MRI brain pending to rule out CAA once stable  2D Echo - EF 55 -  60%. No cardiac source of emboli identified.   [employer] Virus 2 - negative  LDL - 91  HgbA1c - 5.7  UDS - negative  VTE prophylaxis - SCDs  No antithrombotic prior to admission, now on No antithrombotic  Ongoing aggressive stroke risk factor management  Therapy recommendations:  pending  Disposition:  Pending  Hypertension  Home BP meds: Diltiazem XR 240 mg daily  Stable  Off cleviprex . SBP goal < 160 mm Hg  . Long-term BP goal normotensive  Hyperlipidemia  Home Lipid lowering medication: none   LDL - 91, goal < 70  May consider low dose statin on d/c  Other Stroke Risk Factors  Advanced age  Family hx stroke - not on file  Other Active Problems  Hypokalemia - 3.1->3.2 - supplemented - recheck in AM - 3.6  Possible UTI (temp 99.2) - UA with WBC 6-10, on Bactrim for 3 days - urine culture-> reincubated - pending   Hospital day # 2  This patient is critically ill due to [place] at posterior circulation, hypertension and at significant risk of neurological worsening, death form hematoma expansion,  cerebral edema, brain herniation, seizure, hydrocephalus. This patient's care requires constant monitoring of vital signs, hemodynamics, respiratory and cardiac monitoring, review of multiple databases, neurological assessment, discussion with family, other specialists and medical decision making of high complexity. I spent 35 minutes of neurocritical care time in the care of this patient.   03/10/2019 8:34 AM   To contact Stroke Continuity provider, please refer to [email or web address] After hours, contact General Neurology

## 2019-03-10 NOTE — Progress Notes (Signed)
Rehab Admissions Coordinator Note:  Patient was screened by Cleatrice Burke for appropriateness for an Inpatient Acute Rehab Consult.  At this time, we are recommending Inpatient Rehab consult.  Cleatrice Burke 03/10/2019, 3:49 PM  I can be reached at [phone].

## 2019-03-10 NOTE — Progress Notes (Signed)
Subjective: Beth Foster is seen today for follow up painful, elongated, thickened toenails 1-5 b/l feet that she cannot cut. Pain interferes with daily activities. Aggravating factor includes wearing enclosed shoe gear and relieved with periodic debridement.  Current Outpatient Medications on File Prior to Visit  Medication Sig  . acetaminophen (TYLENOL) 325 MG tablet Take 325 mg by mouth every 6 (six) hours as needed for mild pain or headache.  . alendronate (FOSAMAX) 70 MG tablet TAKE 1 TABLET BY MOUTH ONCE A WEEK WITH A FULL GLASS OF WATER ON AN EMPTY STOMACH  . cholecalciferol (VITAMIN D) 1000 UNITS tablet Take 2,000 Units by mouth daily.   Marland Kitchen DILT-XR 240 MG 24 hr capsule TAKE 1 CAPSULE(240 MG) BY MOUTH DAILY  . estradiol (ESTRACE) 0.1 MG/GM vaginal cream Place 1 Applicatorful vaginally 3 (three) times a week.  . Multiple Vitamins-Minerals (EYE VITAMINS PO) Take 1 tablet by mouth daily.  . naproxen (NAPROSYN) 250 MG tablet TAKE 1 TABLET BY MOUTH TWICE DAILY FOR 5 DAYS. MAY USE EVERY 12 HOURS AS NEEDED PAIN   No current facility-administered medications on file prior to visit.      Allergies  Allergen Reactions  . Amoxicillin Anaphylaxis and Other (See Comments)    Has patient had a PCN reaction causing immediate rash, facial/tongue/throat swelling, SOB or lightheadedness with hypotension: Yes Has patient had a PCN reaction causing severe rash involving mucus membranes or skin necrosis: No Has patient had a PCN reaction that required hospitalization No Has patient had a PCN reaction occurring within the last 10 years: No If all of the above answers are "NO", then may proceed with Cephalosporin use.  . Angiotensin Receptor Blockers Anaphylaxis, Itching and Rash  . Beta Adrenergic Blockers Anaphylaxis, Itching and Rash  . Cephalexin Anaphylaxis  . Clindamycin/Lincomycin Anaphylaxis, Itching and Rash  . Eggs Or Egg-Derived Products Other (See Comments)    Reaction:  Blisters in mouth    . Tape Other (See Comments)    Reaction:  Pulls skin off   . Corticosteroids Itching and Rash  . Hydrocortisone Itching and Rash  . Latex Itching and Rash  . Other Itching, Rash and Other (See Comments)    Pt states that she has a pine allergy and she is only able to use fragrance free soaps and laundry products.    . Reglan [Metoclopramide] Itching and Rash     Objective:  Vascular Examination: Capillary refill time immediate x 10 digits.  Dorsalis pedis present b/l.  Posterior tibial pulses present b/l.  Digital hair present x 10 digits.  Skin temperature gradient WNL b/l.   Dermatological Examination: Skin with normal turgor, texture and tone b/l.  Toenails 1-5 b/l discolored, thick, dystrophic with subungual debris and pain with palpation to nailbeds due to thickness of nails.  Musculoskeletal: Muscle strength 5/5 to all LE muscle groups.  No pain, crepitus or joint limitation noted with ROM.   Neurological Examination: Protective sensation intact with 10 gram monofilament bilaterally.  Epicritic sensation present bilaterally.  Vibratory sensation intact bilaterally.   Assessment: Painful onychomycosis toenails 1-5 b/l   Plan: 1. Toenails 1-5 b/l were debrided in length and girth without iatrogenic bleeding. 2. Patient to continue soft, supportive shoe gear 3. Patient to report any pedal injuries to medical professional immediately. 4. Follow up 3 months.  5. Patient/POA to call should there be a concern in the interim.

## 2019-03-11 ENCOUNTER — Telehealth: Payer: Self-pay | Admitting: *Deleted

## 2019-03-11 LAB — BASIC METABOLIC PANEL
Anion gap: 9 (ref 5–15)
BUN: 7 mg/dL — ABNORMAL LOW (ref 8–23)
CO2: 22 mmol/L (ref 22–32)
Calcium: 8.8 mg/dL — ABNORMAL LOW (ref 8.9–10.3)
Chloride: 110 mmol/L (ref 98–111)
Creatinine, Ser: 0.54 mg/dL (ref 0.44–1.00)
GFR calc Af Amer: >60 mL/min (ref >60)
GFR calc non Af Amer: >60 mL/min (ref >60)
Glucose, Bld: 117 mg/dL — ABNORMAL HIGH (ref 70–99)
Potassium: 3.2 mmol/L — ABNORMAL LOW (ref 3.5–5.1)
Sodium: 141 mmol/L (ref 135–145)

## 2019-03-11 LAB — CBC
HCT: 40.1 % (ref 36.0–46.0)
Hemoglobin: 12.9 g/dL (ref 12.0–15.0)
MCH: 31.5 pg (ref 26.0–34.0)
MCHC: 32.2 g/dL (ref 30.0–36.0)
MCV: 98 fL (ref 80.0–100.0)
Platelets: 165 10*3/uL (ref 150–400)
RBC: 4.09 MIL/uL (ref 3.87–5.11)
RDW: 13.2 % (ref 11.5–15.5)
WBC: 6.7 10*3/uL (ref 4.0–10.5)
nRBC: 0 % (ref 0.0–0.2)

## 2019-03-11 MED ORDER — POTASSIUM CHLORIDE CRYS ER 20 MEQ PO TBCR
40.0000 meq | EXTENDED_RELEASE_TABLET | ORAL | Status: AC
  Administered 2019-03-11 (×2): 40 meq via ORAL
  Filled 2019-03-11 (×2): qty 2

## 2019-03-11 MED ORDER — DILTIAZEM HCL ER COATED BEADS 240 MG PO CP24
240.0000 mg | ORAL_CAPSULE | Freq: Every day | ORAL | Status: DC
  Administered 2019-03-11: 240 mg via ORAL
  Filled 2019-03-11 (×2): qty 1

## 2019-03-11 MED ORDER — FOSFOMYCIN TROMETHAMINE 3 G PO PACK
3.0000 g | PACK | Freq: Once | ORAL | Status: AC
  Administered 2019-03-11: 3 g via ORAL
  Filled 2019-03-11: qty 3

## 2019-03-11 NOTE — Telephone Encounter (Signed)
Beth Foster, Granddaughter called and stated that she was just calling to let you know that patient is currently in ICU at [facility] since Friday due to Hemorrhage Stroke. Has brain bleed. Patient has no short term memory and very confused. Just woke yesterday afternoon.

## 2019-03-11 NOTE — Evaluation (Signed)
Speech Language Pathology Evaluation Patient Details Name: Beth Foster MRN: [id number] DOB: December 18, 1928 Today's Date: 03/11/2019 Time: [id number] SLP Time Calculation (min) (ACUTE ONLY): 20 min  Problem List:  Patient Active Problem List   Diagnosis Date Noted  . Pressure injury of skin 03/09/2019  . ICH (intracerebral hemorrhage) ([place]) 03/08/2019   Past Medical History:  Past Medical History:  Diagnosis Date  . Hypertension    Past Surgical History: History reviewed. No pertinent surgical history. HPI:  85 y.o. female with a history of hypertension (and per grandaughter, h/o wet macular degeneration) who presents with unsteady gait and right gaze deviation started sometime 03/06/19 to 03/08/19 (adm 10/16). CT revealed a small L cerebellar peduncle hemorrhage; MRI 03/10/19 with no incr size of hematoma, +intraventricular penetration lateral and 4th ventricle with ventricular size stable.    Assessment / Plan / Recommendation Clinical Impression  Pt was cooperative and kind during session. Upon SLP arrival, pt described a line of children in the hallway and was not cognisant that this was a hallucination. She provided history of prior level of functioning, previously living alone fully independent with support at d/c from granddaughter who lives nearby. She has a [employer] education and prior employment at [employer]. She is aware of her deficits relating to poor memory recall and "getting what she is trying to say out". Given the COGNISTAT with adjustments for pt's visual impairment related to macular degeneration, she scored within the average range on orientation (although not oriented to date/time), attention, and repetition. Severe impairment score in memory; she required 3x repetition for memory registration indicating deficits in storage, and did not recall 2/4 items after 5 minute delay given 3 options. During language sample picture scene task, pt was asked if she could see it and  replied that she could see it clearly, but it "all looks [place] to me" with no further response. Pt presents with mild aphaisa during confrontational naming task with objects achieved 4/6 with tactile cues, naming with pictures achieved 1/4. She reported knowing the word for pen cap but not being able to say it. Problem solving and safety/judgment assessed with call bell, pt required max cues for correct response. Pt requires continued SLP services to target memory, executive functioning, and mild aphasia.     SLP Assessment  SLP Recommendation/Assessment: Patient needs continued Speech Lanaguage Pathology Services SLP Visit Diagnosis: Cognitive communication deficit (R41.841);Aphasia (R47.01)    Follow Up Recommendations  Inpatient Rehab    Frequency and Duration min 2x/week  2 weeks      SLP Evaluation Cognition  Overall Cognitive Status: No family/caregiver present to determine baseline cognitive functioning Arousal/Alertness: Awake/alert Orientation Level: Oriented to person;Oriented to place;Disoriented to time Attention: Sustained Sustained Attention: Appears intact Memory: Impaired Memory Impairment: Decreased recall of new information;Storage deficit Awareness: Impaired Awareness Impairment: Anticipatory impairment Problem Solving: Impaired Problem Solving Impairment: Functional basic(Call bell ) Executive Function: Reasoning;Sequencing Reasoning: Impaired Reasoning Impairment: Verbal basic Initiating: Appears intact Safety/Judgment: Impaired       Comprehension  Auditory Comprehension Overall Auditory Comprehension: Appears within functional limits for tasks assessed Yes/No Questions: Not tested Commands: Not tested Conversation: Simple Visual Recognition/Discrimination Discrimination: Not tested(Visual Impairment) Reading Comprehension Reading Status: Unable to assess (comment)(Visual Impairment)    Expression Expression Primary Mode of Expression: Verbal Verbal  Expression Overall Verbal Expression: Impaired Initiation: No impairment Level of Generative/Spontaneous Verbalization: Conversation Repetition: No impairment Level of Impairment: Sentence level Naming: Impairment Confrontation: Impaired(objects 67%, pictures 25%) Pragmatics: No impairment Non-Verbal Means of  Communication: Not applicable Written Expression Dominant Hand: Right Written Expression: Not tested   Oral / Motor  Oral Motor/Sensory Function Overall Oral Motor/Sensory Function: Within functional limits Motor Speech Overall Motor Speech: Appears within functional limits for tasks assessed Respiration: Within functional limits Phonation: Normal Resonance: Within functional limits Articulation: Within functional limitis Intelligibility: Intelligible Motor Planning: Witnin functional limits Motor Speech Errors: Not applicable                       Anmol Paschen 03/11/2019, 11:46 AM

## 2019-03-11 NOTE — Progress Notes (Signed)
Physical Therapy Treatment Patient Details Name: Beth Foster MRN: [id number] DOB: 1929/01/14 Today's Date: 03/11/2019    History of Present Illness 85 y.o. female with a history of hypertension (and per grandaughter, h/o wet macular degeneration) who presents with unsteady gait and right gaze deviation started sometime 03/06/19 to 03/08/19 (adm 10/16). CT revealed a small left cerebellar peduncle hemorrhage; MRI 03/10/19 with no incr size of hematoma, +intraventricular penetration lateral and 4th ventricle with ventricular size stable.     PT Comments    Pt pleasant supine on arrival stating need to toilet. Pt with purewick present until EOB with pt urinating throughout transition to EOB as well as with transfer to [facility]. Once pt stated completion of void attempted to stand for pericare with pt immediately urinating again. Provided linen change, pericare in sitting and donned briefs with next stand but pt again incontinent. Ultimately transferred to chair with wet linens removed and purewick replaced. To progress mobility pt will have to don adult brief in supine to be able to transition OOB. Pt reports incontinence is managed at home and that she can manage pericare and briefs without constant soiling with decreased intake. Will continue to work toward progressive mobility and management of pt urinary needs.  BP 144/74    Follow Up Recommendations  CIR     Equipment Recommendations  Rolling walker with 5" wheels    Recommendations for Other Services       Precautions / Restrictions Precautions Precautions: Fall Precaution Comments: SBP <160, incontinent Restrictions Weight Bearing Restrictions: No    Mobility  Bed Mobility Overal bed mobility: Needs Assistance Bed Mobility: Supine to Sit Rolling: Min guard   Supine to sit: Min assist;HOB elevated     General bed mobility comments: minguard for safety with slight min assist once EOB for balance with left  lean  Transfers Overall transfer level: Needs assistance Equipment used: Rolling walker (2 wheeled) Transfers: Sit to/from [facility] Sit to Stand: Min assist;+2 physical assistance;+2 safety/equipment Stand pivot transfers: Min assist;+2 safety/equipment       General transfer comment: Min A +2 to power up into standing. Requiring cues for hand placement and positioning. Pt with left lean with initial stand from bed with bil UE support. Stood additional 2x from [facility] with cues for hand placement and RW present for stability with no lean with RW  Ambulation/Gait Ambulation/Gait assistance: Min assist;+2 safety/equipment Gait Distance (Feet): 3 Feet Assistive device: Rolling walker (2 wheeled) Gait Pattern/deviations: Shuffle;Narrow base of support   Gait velocity interpretation: <1.8 ft/sec, indicate of risk for recurrent falls General Gait Details: pt with narrow BOS, assist to direct and advance RW with pt only able to walk a few feet limited by incontinence with assist for lines   Stairs             Wheelchair Mobility    Modified Rankin (Stroke Patients Only) Modified Rankin (Stroke Patients Only) Pre-Morbid Rankin Score: Moderate disability Modified Rankin: Moderately severe disability     Balance Overall balance assessment: Needs assistance Sitting-balance support: No upper extremity supported;Feet supported Sitting balance-Leahy Scale: Poor Sitting balance - Comments: minguard EOB with intial left lean able to correct to midline, guarding at [facility]   Standing balance support: Bilateral upper extremity supported;During functional activity Standing balance-Leahy Scale: Poor                              Cognition Arousal/Alertness: Awake/alert Behavior During  Therapy: WFL for tasks assessed/performed Overall Cognitive Status: No family/caregiver present to determine baseline cognitive functioning Area of Impairment: Following  commands;Safety/judgement;Problem solving;Attention                   Current Attention Level: Sustained   Following Commands: Follows one step commands inconsistently;Follows one step commands with increased time Safety/Judgement: Decreased awareness of safety;Decreased awareness of deficits   Problem Solving: Slow processing;Difficulty sequencing General Comments: pt with decreased awareness of incontinence and management, pt with slow processing for commands for transfers and safety      Exercises General Exercises - Lower Extremity Long Arc Quad: AROM;Both;Seated;10 reps Hip Flexion/Marching: AROM;Both;Seated;10 reps    General Comments General comments (skin integrity, edema, etc.): VSS throughout      Pertinent Vitals/Pain Pain Assessment: 0-10 Pain Score: 5  Faces Pain Scale: Hurts little more Pain Location: Head Pain Descriptors / Indicators: Headache Pain Intervention(s): Limited activity within patient's tolerance;Monitored during session;Repositioned    Home Living Family/patient expects to be discharged to:: Private residence Living Arrangements: Alone Available Help at Discharge: Family([place]) Type of Home: House Home Access: Stairs to enter Entrance Stairs-Rails: Right Home Layout: One level Home Equipment: Shower seat;Grab bars - tub/shower;Hand held shower head      Prior Function Level of Independence: Needs assistance  Gait / Transfers Assistance Needed: walks with cane inside home ADL's / Homemaking Assistance Needed: Granddaughter performs all IADLs including driving, cooking, cleaning, and grocery shopping     PT Goals (current goals can now be found in the care plan section) Acute Rehab PT Goals Patient Stated Goal: Get stronger and go home Progress towards PT goals: Progressing toward goals    Frequency           PT Plan Current plan remains appropriate    Co-evaluation PT/OT/SLP Co-Evaluation/Treatment: Yes Reason  for Co-Treatment: Complexity of the patient's impairments (multi-system involvement);For patient/therapist safety PT goals addressed during session: Mobility/safety with mobility;Balance;Proper use of DME OT goals addressed during session: ADL's and self-care      AM-PAC PT "6 Clicks" Mobility   Outcome Measure  Help needed turning from your back to your side while in a flat bed without using bedrails?: A Little Help needed moving from lying on your back to sitting on the side of a flat bed without using bedrails?: A Little Help needed moving to and from a bed to a chair (including a wheelchair)?: A Little Help needed standing up from a chair using your arms (e.g., wheelchair or bedside chair)?: A Little Help needed to walk in hospital room?: A Lot Help needed climbing 3-5 steps with a railing? : A Lot 6 Click Score: 16    End of Session Equipment Utilized During Treatment: Gait belt Activity Tolerance: Patient tolerated treatment well Patient left: in chair;with call bell/phone within reach;with chair alarm set Nurse Communication: Mobility status PT Visit Diagnosis: Unsteadiness on feet (R26.81);Muscle weakness (generalized) (M62.81);Other symptoms and signs involving the nervous system (R29.898);Other abnormalities of gait and mobility (R26.89)     Time: [id number] PT Time Calculation (min) (ACUTE ONLY): 30 min  Charges:  $Gait Training: 8-22 mins                     Wink, PT Acute Rehabilitation Services Pager: [phone] Office: [place] 03/11/2019, 12:08 PM

## 2019-03-11 NOTE — Telephone Encounter (Signed)
I'm so sorry to hear this.  I was reading about it.  I will follow along via computer.

## 2019-03-11 NOTE — Evaluation (Signed)
Occupational Therapy Evaluation Patient Details Name: Beth Foster MRN: [id number] DOB: 07-23-1928 Today's Date: 03/11/2019    History of Present Illness 85 y.o. female with a history of hypertension (and per grandaughter, h/o wet macular degeneration) who presents with unsteady gait and right gaze deviation started sometime 03/06/19 to 03/08/19 (adm 10/16). CT revealed a small left cerebellar peduncle hemorrhage; MRI 03/10/19 with no incr size of hematoma, +intraventricular penetration lateral and 4th ventricle with ventricular size stable.    Clinical Impression   PTA, pt was living alone and her granddaughter would visit and perform her IADLs (due to vision deficits). Pt currently requiring Min A for UB ADLs, Max A for LB ADLs, and Min A +2 for functional mobility with RW. Pt presenting with decreased strength, balance, cognition, vision, and safety. Pt very agreeable and motivated to participate in therapy. Pt will require further acute OT to facilitate safe dc and increase safety and independence with ADLs and functional mobility. Recommend dc to CIR for intensive OT to optimize safety, increase independence with ADLs, and decrease caregiver burden.     Follow Up Recommendations  CIR;Supervision/Assistance - 24 hour    Equipment Recommendations  Other (comment)(Defer to next venue)    Recommendations for Other Services Rehab consult;PT consult;Speech consult     Precautions / Restrictions Precautions Precautions: Fall Precaution Comments: SBP <160 Restrictions Weight Bearing Restrictions: No      Mobility Bed Mobility Overal bed mobility: Needs Assistance Bed Mobility: Supine to Sit Rolling: Min guard         General bed mobility comments: Min Guard A for safety and increased time for effort.  Transfers Overall transfer level: Needs assistance Equipment used: Rolling walker (2 wheeled) Transfers: Sit to/from Stand Sit to Stand: Min assist;+2 physical assistance;+2  safety/equipment         General transfer comment: Min A +2 to power up into standing. Requiring cues for hand placement and positioning. Pt with laterall leaning and requiring increased assistance for balance    Balance Overall balance assessment: Needs assistance Sitting-balance support: No upper extremity supported;Feet supported Sitting balance-Leahy Scale: Fair     Standing balance support: Bilateral upper extremity supported;During functional activity Standing balance-Leahy Scale: Poor                             ADL either performed or assessed with clinical judgement   ADL Overall ADL's : Needs assistance/impaired Eating/Feeding: Set up;Sitting   Grooming: Wash/dry hands;Set up;Sitting Grooming Details (indicate cue type and reason): Pt washing her hands while seated with hand sanitizer. Upper Body Bathing: Minimal assistance;Sitting   Lower Body Bathing: Maximal assistance;Sit to/from stand   Upper Body Dressing : Minimal assistance;Sitting Upper Body Dressing Details (indicate cue type and reason): Min A for donning new gown Lower Body Dressing: Maximal assistance;Sit to/from stand Lower Body Dressing Details (indicate cue type and reason): Max A to don socks. Pt attempting to bring ankles to knees but unable to perform successfully stating "this is much harder than when I sit on my toilet." Toilet Transfer: Minimal assistance;+2 for physical assistance;RW;BSC Toilet Transfer Details (indicate cue type and reason): Pt requiring Min A +2 to power up into standing and to maintain balance throughout.  Toileting- Clothing Manipulation and Hygiene: Maximal assistance;Minimal assistance;Sit to/from stand;Sitting/lateral lean Toileting - Clothing Manipulation Details (indicate cue type and reason): Pt performing peri care while seated on BSC with cues for lateral lean. Requiring Min A for safety  and balance during lateral lean. Pt requiring Max A for peri care while  in standing     Functional mobility during ADLs: Minimal assistance;+2 for physical assistance;Rolling walker;Cueing for sequencing;Cueing for safety General ADL Comments: Pt with decreased strength, balance, safety, and cognition impacting his safe performance of ADLs.     Vision Baseline Vision/History: Wears glasses;Macular Degeneration Wears Glasses: At all times Patient Visual Report: No change from baseline       Perception     Praxis      Pertinent Vitals/Pain Pain Assessment: 0-10 Pain Score: 5  Faces Pain Scale: Hurts little more Pain Location: Head Pain Descriptors / Indicators: Headache Pain Intervention(s): Monitored during session;Limited activity within patient's tolerance;Repositioned     Hand Dominance Right   Extremity/Trunk Assessment Upper Extremity Assessment Upper Extremity Assessment: Generalized weakness   Lower Extremity Assessment Lower Extremity Assessment: Defer to PT evaluation   Cervical / Trunk Assessment Cervical / Trunk Assessment: Kyphotic   Communication Communication Communication: No difficulties   Cognition Arousal/Alertness: Awake/alert Behavior During Therapy: WFL for tasks assessed/performed Overall Cognitive Status: No family/caregiver present to determine baseline cognitive functioning Area of Impairment: Following commands;Safety/judgement;Problem solving;Attention                   Current Attention Level: Sustained   Following Commands: Follows one step commands inconsistently;Follows one step commands with increased time Safety/Judgement: Decreased awareness of safety;Decreased awareness of deficits   Problem Solving: Slow processing;Difficulty sequencing General Comments: Pt pleasant throughout and motivated to participate in therapy. Pt presenting with decreased attention, following cues, and problem solving. No family present to confirm baseline cognition. Pt requiring increased time and cues. Also with  decreased awareness of significance of urine incontience.   General Comments  VSS throughout    Exercises     Shoulder Instructions      Home Living Family/patient expects to be discharged to:: Private residence Living Arrangements: Alone Available Help at Discharge: Family([place]) Type of Home: House Home Access: Stairs to enter [employer] of Steps: 3 Entrance Stairs-Rails: Right Home Layout: One level     Bathroom Shower/Tub: Teacher, early years/pre: Standard     Home Equipment: Shower seat;Grab bars - tub/shower;Hand held shower head      Lives With: Alone    Prior Functioning/Environment Level of Independence: Needs assistance  Gait / Transfers Assistance Needed: walks with cane inside home ADL's / Homemaking Assistance Needed: Granddaughter performs all IADLs including driving, cooking, cleaning, and grocery shopping            OT Problem List: Decreased strength;Decreased range of motion;Decreased activity tolerance;Impaired balance (sitting and/or standing);Impaired vision/perception;Decreased coordination;Decreased cognition;Decreased safety awareness;Decreased knowledge of use of DME or AE;Decreased knowledge of precautions;Pain      OT Treatment/Interventions: Self-care/ADL training;Therapeutic exercise;DME and/or AE instruction;Energy conservation;Therapeutic activities;Patient/family education    OT Goals(Current goals can be found in the care plan section) Acute Rehab OT Goals Patient Stated Goal: Get stronger and go home OT Goal Formulation: With patient Time For Goal Achievement: 03/25/19 Potential to Achieve Goals: Good  OT Frequency: Min 3X/week   Barriers to D/C:            Co-evaluation PT/OT/SLP Co-Evaluation/Treatment: Yes Reason for Co-Treatment: For patient/therapist safety;To address functional/ADL transfers   OT goals addressed during session: ADL's and self-care      AM-PAC OT "6 Clicks" Daily  Activity     Outcome Measure Help from another person eating meals?: None Help from another person taking  care of personal grooming?: A Little Help from another person toileting, which includes using toliet, bedpan, or urinal?: A Lot Help from another person bathing (including washing, rinsing, drying)?: A Lot Help from another person to put on and taking off regular upper body clothing?: A Little Help from another person to put on and taking off regular lower body clothing?: A Lot 6 Click Score: 16   End of Session Equipment Utilized During Treatment: Gait belt;Rolling walker Nurse Communication: Mobility status  Activity Tolerance: Patient tolerated treatment well Patient left: in chair;with call bell/phone within reach;with chair alarm set  OT Visit Diagnosis: Unsteadiness on feet (R26.81);Other abnormalities of gait and mobility (R26.89);Muscle weakness (generalized) (M62.81);Other symptoms and signs involving cognitive function;Pain Pain - part of body: (HA)                Time: [id number] OT Time Calculation (min): 33 min Charges:  OT General Charges $OT Visit: 1 Visit OT Evaluation $OT Eval Moderate Complexity: [place], OTR/L Acute Rehab Pager: [phone] Office: [place] 03/11/2019, 11:26 AM

## 2019-03-11 NOTE — Progress Notes (Signed)
Inpatient Rehab Admissions:  Inpatient Rehab Consult received.  I met with patient and her grand daughter at the bedside for rehabilitation assessment and to discuss goals and expectations of an inpatient rehab admission.  Both are hopeful for CIR, and confirm 24/7 support with grand daughter and her husband.  I will open insurance for prior authorization for possible admission this week.   Signed: Shann Medal, PT, DPT Admissions Coordinator [phone] 03/11/19  1:19 PM

## 2019-03-11 NOTE — Progress Notes (Signed)
STROKE TEAM PROGRESS NOTE   INTERVAL HISTORY Her RN is at the bedside. Pt awake alert much interactive and less lethargic, denies dizzy but slight HA remains. Pt tried work with PT yesterday but when sitting up in bed, she felt dizzy. No vomiting. MRI showed stable hematoma without evidence of CAA.     OBJECTIVE Vitals:   03/11/19 0341 03/11/19 0400 03/11/19 0600 03/11/19 0800  BP:  (!) 149/59 (!) 147/79 113/75  Pulse:  68 79 87  Resp:  13 19 (!) 21  Temp: 97.7 F (36.5 C)   99 F (37.2 C)  TempSrc: Oral   Oral  SpO2:  90% 94% 93%  Weight:      Height:        CBC:  Recent Labs  Lab 03/08/19 1917  03/10/19 0619 03/11/19 0710  WBC 7.2   < > 10.3 6.7  NEUTROABS 3.1  --   --   --   HGB 13.0   < > 12.6 12.9  HCT 39.7   < > 38.1 40.1  MCV 100.3*   < > 98.4 98.0  PLT 226   < > 175 165   < > = values in this interval not displayed.    Basic Metabolic Panel:  Recent Labs  Lab 03/10/19 0619 03/11/19 0710  NA 141 141  K 3.6 3.2*  CL 111 110  CO2 21* 22  GLUCOSE 118* 117*  BUN 8 7*  CREATININE 0.65 0.54  CALCIUM 8.8* 8.8*  MG 2.2  --     Lipid Panel:     Component Value Date/Time   CHOL 168 03/10/2019 0619   TRIG 34 03/10/2019 0619   HDL 70 03/10/2019 0619   CHOLHDL 2.4 03/10/2019 0619   VLDL 7 03/10/2019 0619   LDLCALC 91 03/10/2019 0619   HgbA1c:  Lab Results  Component Value Date   HGBA1C 5.7 (H) 03/09/2019   Urine Drug Screen:     Component Value Date/Time   LABOPIA NONE DETECTED 03/09/2019 0039   COCAINSCRNUR NONE DETECTED 03/09/2019 0039   LABBENZ NONE DETECTED 03/09/2019 0039   AMPHETMU NONE DETECTED 03/09/2019 0039   THCU NONE DETECTED 03/09/2019 0039   LABBARB NONE DETECTED 03/09/2019 0039    Alcohol Level     Component Value Date/Time   ETH <10 03/08/2019 1917    IMAGING  Ct Angio Head W Or Wo Contrast Ct Angio Neck W And/or Wo Contrast 03/08/2019 IMPRESSION:  1. Mild atherosclerotic disease at both carotid bifurcations but no  stenosis or irregularity.  2. Tortuosity and dilatation of the supraclinoid ICA on the right measuring 8 mm and on the left 7 mm.  3. No intracranial large or medium vessel occlusion or correctable proximal stenosis.  4. There is a small enhancing vessel in the region of the left cerebellar hematoma, but this is not definitely significant.   Dg Sacrum/coccyx 03/09/2019 IMPRESSION:  Limited evaluation as excreted IV contrast in the urinary bladder obscures the AP views. Allowing for this, no evidence of sacral or coccygeal fracture.   Dg Pelvis Portable 03/08/2019 IMPRESSION:  No acute osseous abnormality identified. Osseous structures are diffusely demineralized. If a high clinical suspicion for occult fracture persists, MRI could be considered.  Ct C-spine No Charge 03/08/2019 IMPRESSION:  Multilevel degenerative disc disease and facet hypertrophy throughout the cervical spine without acute osseous abnormality.   Ct Head Code Stroke Wo Contrast 03/08/2019 1. 2.3 cc acute hematoma within the left cerebellum with mild surrounding edema. No intraventricular  or subarachnoid penetration. Elsewhere, atrophy an extensive chronic small-vessel ischemic changes.  2. ASPECTS is 10.   03/09/2019 Doubling of the size of a left cerebellar intraparenchymal hematoma, now measuring 2.4 x 1.6 x 1.3 cm for a volume of 5 CC. Intraventricular penetration into the fourth ventricle and cerebral aqueduct. Small amount of blood within the occipital horns of the lateral ventricles. In retrospect, tiny focus of contrast density on the CT angiography was probably an indication of ongoing bleeding.  Ct Head Wo Contrast  03/09/19 IMPRESSION: 1. Hemorrhage within the medial left cerebellum and 4th ventricle is contiguous and stable from 0949 hours today. Stable trace IVH elsewhere. 2. No significant intracranial mass effect.  No ventriculomegaly. 3. No new intracranial abnormality. Underlying chronic small  vessel disease.  ECG - SR rate 82 BPM. (See cardiology reading for complete details)   Transthoracic Echocardiogram  IMPRESSIONS  1. Left ventricular ejection fraction, by visual estimation, is 55 to 60%. The left ventricle has normal function. Normal left ventricular size. There is mildly increased left ventricular hypertrophy.  2. Left ventricular diastolic Doppler parameters are consistent with impaired relaxation pattern of LV diastolic filling.  3. Global right ventricle has normal systolic function.The right ventricular size is normal. No increase in right ventricular wall thickness.  4. Left atrial size was normal.  5. Right atrial size was normal.  6. Presence of pericardial fat pad.  7. Trivial pericardial effusion is present.  8. The pericardial effusion is posterior to the left ventricle.  9. Mild mitral annular calcification. 10. The mitral valve is grossly normal. Mild mitral valve regurgitation. 11. The tricuspid valve is grossly normal. Tricuspid valve regurgitation is trivial. 12. The aortic valve is tricuspid Aortic valve regurgitation was not visualized by color flow Doppler. Mild aortic valve sclerosis without stenosis. 13. The pulmonic valve was grossly normal. Pulmonic valve regurgitation is mild by color flow Doppler.  Mr Brain Wo Contrast  Result Date: 03/10/2019 CLINICAL DATA:  Follow-up intracranial hemorrhage. EXAM: MRI HEAD WITHOUT CONTRAST TECHNIQUE: Multiplanar, multiecho pulse sequences of the brain and surrounding structures were obtained without intravenous contrast. COMPARISON:  Multiple CT studies 03/08/2019 and 03/09/2019. FINDINGS: Brain: The brainstem itself appears normal. Acute hemorrhage within the medial left cerebellum as shown previously with intraventricular penetration and blood within the fourth ventricle and dependent within the occipital horns of the lateral ventricles. Size of the hematoma is better evaluated by CT, but there does not appear to  be further enlargement. Ventricular size is stable. Cerebral hemispheres elsewhere show atrophy with chronic small-vessel ischemic changes of the thalami, basal ganglia and hemispheric white matter. No large vessel territory infarction. Scattered foci of hemosiderin deposition associated with many of the old small vessel infarctions, consistent with hypertensive disease. Vascular: Major vessels at the base of the brain show flow. Skull and upper cervical spine: Negative Sinuses/Orbits: Clear/normal Other: None IMPRESSION: There does not appear to be enlargement of the left cerebellar hematoma, as best as can be compared across modalities. Intraventricular penetration as shown by CT with blood in the fourth ventricle and dependent in the lateral ventricles. Ventricular size is stable. Extensive chronic small-vessel ischemic changes elsewhere throughout the brain, many associated with hemosiderin deposition, consistent with hypertensive etiology. Electronically Signed   By: Nelson Chimes M.D.   On: 03/10/2019 11:23      PHYSICAL EXAM  Temp:  [97.7 F (36.5 C)-99 F (37.2 C)] 99 F (37.2 C) (10/19 0800) Pulse Rate:  [68-111] 87 (10/19 0800) Resp:  [13-22] 21 (  10/19 0800) BP: (101-172)/(53-95) 113/75 (10/19 0800) SpO2:  [90 %-95 %] 93 % (10/19 0800)  General - Well nourished, well developed, not in acute distress  Ophthalmologic - fundi not visualized due to noncooperation.  Cardiovascular - Regular rhythm and rate.  Neuro - awake alert interactive and follow simple command, knows her name, place, month and age, but not orientated to year. Able to repeat and name but mild dysarthria. PERRL, no obvious nystagmus. Blinking to visual threat bilaterally. No facial droop, tongue midline. BUE 4/5 and BLE 3/5, effort related. Sensation symmetrical, and FTN slow with mild dysmetria on the left. Gait not tested.    ASSESSMENT/PLAN Ms. CARALYNN [place] is a 85 y.o. female with history of hypertension who  presents with unsteady gait and right gaze deviation started sometime in the past 2 days. She did not receive IV t-PA due to [place].  ICH - left cerebellar peduncle ICH with small amount of IVH  Code Stroke CT Head - 2.3 cc acute hematoma within the left cerebellum with mild surrounding edema.   CT head - hematoma expansion to 5cc  CTA H&N - Tortuosity and dilatation of the supraclinoid ICA on the right measuring 8 mm and on the left 7 mm.   CT repeat 6pm 10/17 - stable hematoma  MRI brain stable hematoma and no evidence of CAA   2D Echo - EF 55 - 60%. No cardiac source of emboli identified.   [employer] Virus 2 - negative  LDL - 91  HgbA1c - 5.7  UDS - negative  VTE prophylaxis - SCDs  No antithrombotic prior to admission, now on No antithrombotic  Ongoing aggressive stroke risk factor management  Therapy recommendations:  CIR  Disposition:  Pending  Hypertension  Home BP meds: Diltiazem XR 240 mg daily  Stable  Off cleviprex  Put back on diltiazem  . SBP goal < 160 mm Hg  . Long-term BP goal normotensive  Hyperlipidemia  Home Lipid lowering medication: none   LDL - 91, goal < 70  May consider low dose statin on d/c  Other Stroke Risk Factors  Advanced age  Family hx stroke - not on file  Other Active Problems  Hypokalemia - 3.1->3.2->3.6->3.2- supplement  Possible UTI - UA with WBC 6-10,  urine culture-> Entercoccus faecalis - on Bactrim for 3 days   Hospital day # 3  This patient is critically ill due to [place] at posterior circulation, hypertension and at significant risk of neurological worsening, death form hematoma expansion, cerebral edema, brain herniation, seizure, hydrocephalus. This patient's care requires constant monitoring of vital signs, hemodynamics, respiratory and cardiac monitoring, review of multiple databases, neurological assessment, discussion with family, other specialists and medical decision making of high complexity. I spent  30 minutes of neurocritical care time in the care of this patient.   03/11/2019 9:28 AM   To contact Stroke Continuity provider, please refer to [email or web address] After hours, contact General Neurology

## 2019-03-12 ENCOUNTER — Encounter (HOSPITAL_COMMUNITY): Payer: Self-pay | Admitting: Physical Medicine and Rehabilitation

## 2019-03-12 ENCOUNTER — Other Ambulatory Visit: Payer: Self-pay

## 2019-03-12 ENCOUNTER — Encounter: Payer: Self-pay | Admitting: Podiatry

## 2019-03-12 ENCOUNTER — Inpatient Hospital Stay (HOSPITAL_COMMUNITY)
Admission: RE | Admit: 2019-03-12 | Discharge: 2019-04-04 | DRG: 092 | Disposition: A | Discharge status: Skilled Nursing Facility | Payer: Medicare Other | Source: Intra-hospital | Attending: Physical Medicine & Rehabilitation | Admitting: Physical Medicine & Rehabilitation

## 2019-03-12 ENCOUNTER — Encounter (HOSPITAL_COMMUNITY): Payer: Self-pay

## 2019-03-12 DIAGNOSIS — M6281 Muscle weakness (generalized): Secondary | ICD-10-CM | POA: Diagnosis not present

## 2019-03-12 DIAGNOSIS — N39498 Other specified urinary incontinence: Secondary | ICD-10-CM | POA: Diagnosis present

## 2019-03-12 DIAGNOSIS — Z20828 Contact with and (suspected) exposure to other viral communicable diseases: Secondary | ICD-10-CM | POA: Diagnosis present

## 2019-03-12 DIAGNOSIS — K5900 Constipation, unspecified: Secondary | ICD-10-CM | POA: Diagnosis not present

## 2019-03-12 DIAGNOSIS — G459 Transient cerebral ischemic attack, unspecified: Secondary | ICD-10-CM | POA: Diagnosis not present

## 2019-03-12 DIAGNOSIS — R278 Other lack of coordination: Secondary | ICD-10-CM | POA: Diagnosis not present

## 2019-03-12 DIAGNOSIS — H3530 Unspecified macular degeneration: Secondary | ICD-10-CM | POA: Diagnosis not present

## 2019-03-12 DIAGNOSIS — Z743 Need for continuous supervision: Secondary | ICD-10-CM | POA: Diagnosis not present

## 2019-03-12 DIAGNOSIS — Z7401 Bed confinement status: Secondary | ICD-10-CM | POA: Diagnosis not present

## 2019-03-12 DIAGNOSIS — H409 Unspecified glaucoma: Secondary | ICD-10-CM | POA: Diagnosis not present

## 2019-03-12 DIAGNOSIS — I613 Nontraumatic intracerebral hemorrhage in brain stem: Secondary | ICD-10-CM

## 2019-03-12 DIAGNOSIS — I615 Nontraumatic intracerebral hemorrhage, intraventricular: Secondary | ICD-10-CM

## 2019-03-12 DIAGNOSIS — R339 Retention of urine, unspecified: Secondary | ICD-10-CM | POA: Diagnosis not present

## 2019-03-12 DIAGNOSIS — R159 Full incontinence of feces: Secondary | ICD-10-CM | POA: Diagnosis present

## 2019-03-12 DIAGNOSIS — Z79899 Other long term (current) drug therapy: Secondary | ICD-10-CM

## 2019-03-12 DIAGNOSIS — K219 Gastro-esophageal reflux disease without esophagitis: Secondary | ICD-10-CM | POA: Diagnosis present

## 2019-03-12 DIAGNOSIS — I69391 Dysphagia following cerebral infarction: Secondary | ICD-10-CM

## 2019-03-12 DIAGNOSIS — K5901 Slow transit constipation: Secondary | ICD-10-CM | POA: Diagnosis not present

## 2019-03-12 DIAGNOSIS — G479 Sleep disorder, unspecified: Secondary | ICD-10-CM | POA: Diagnosis not present

## 2019-03-12 DIAGNOSIS — R0989 Other specified symptoms and signs involving the circulatory and respiratory systems: Secondary | ICD-10-CM | POA: Diagnosis not present

## 2019-03-12 DIAGNOSIS — I619 Nontraumatic intracerebral hemorrhage, unspecified: Secondary | ICD-10-CM | POA: Diagnosis present

## 2019-03-12 DIAGNOSIS — R2689 Other abnormalities of gait and mobility: Principal | ICD-10-CM | POA: Diagnosis present

## 2019-03-12 DIAGNOSIS — I739 Peripheral vascular disease, unspecified: Secondary | ICD-10-CM | POA: Diagnosis present

## 2019-03-12 DIAGNOSIS — I951 Orthostatic hypotension: Secondary | ICD-10-CM | POA: Diagnosis not present

## 2019-03-12 DIAGNOSIS — Z881 Allergy status to other antibiotic agents status: Secondary | ICD-10-CM

## 2019-03-12 DIAGNOSIS — B3731 Acute candidiasis of vulva and vagina: Secondary | ICD-10-CM

## 2019-03-12 DIAGNOSIS — N390 Urinary tract infection, site not specified: Secondary | ICD-10-CM

## 2019-03-12 DIAGNOSIS — Z7983 Long term (current) use of bisphosphonates: Secondary | ICD-10-CM

## 2019-03-12 DIAGNOSIS — E785 Hyperlipidemia, unspecified: Secondary | ICD-10-CM | POA: Diagnosis not present

## 2019-03-12 DIAGNOSIS — I69319 Unspecified symptoms and signs involving cognitive functions following cerebral infarction: Secondary | ICD-10-CM

## 2019-03-12 DIAGNOSIS — R0602 Shortness of breath: Secondary | ICD-10-CM | POA: Diagnosis not present

## 2019-03-12 DIAGNOSIS — B373 Candidiasis of vulva and vagina: Secondary | ICD-10-CM | POA: Diagnosis present

## 2019-03-12 DIAGNOSIS — I69218 Other symptoms and signs involving cognitive functions following other nontraumatic intracranial hemorrhage: Secondary | ICD-10-CM

## 2019-03-12 DIAGNOSIS — R7303 Prediabetes: Secondary | ICD-10-CM | POA: Diagnosis not present

## 2019-03-12 DIAGNOSIS — M2550 Pain in unspecified joint: Secondary | ICD-10-CM | POA: Diagnosis not present

## 2019-03-12 DIAGNOSIS — E8809 Other disorders of plasma-protein metabolism, not elsewhere classified: Secondary | ICD-10-CM | POA: Diagnosis present

## 2019-03-12 DIAGNOSIS — I614 Nontraumatic intracerebral hemorrhage in cerebellum: Secondary | ICD-10-CM | POA: Diagnosis not present

## 2019-03-12 DIAGNOSIS — Z66 Do not resuscitate: Secondary | ICD-10-CM | POA: Diagnosis present

## 2019-03-12 DIAGNOSIS — B952 Enterococcus as the cause of diseases classified elsewhere: Secondary | ICD-10-CM | POA: Diagnosis not present

## 2019-03-12 DIAGNOSIS — I10 Essential (primary) hypertension: Secondary | ICD-10-CM | POA: Diagnosis present

## 2019-03-12 DIAGNOSIS — R2681 Unsteadiness on feet: Secondary | ICD-10-CM | POA: Diagnosis not present

## 2019-03-12 DIAGNOSIS — R1310 Dysphagia, unspecified: Secondary | ICD-10-CM | POA: Diagnosis present

## 2019-03-12 DIAGNOSIS — I959 Hypotension, unspecified: Secondary | ICD-10-CM | POA: Diagnosis not present

## 2019-03-12 DIAGNOSIS — E876 Hypokalemia: Secondary | ICD-10-CM

## 2019-03-12 DIAGNOSIS — M25551 Pain in right hip: Secondary | ICD-10-CM | POA: Diagnosis not present

## 2019-03-12 DIAGNOSIS — I616 Nontraumatic intracerebral hemorrhage, multiple localized: Secondary | ICD-10-CM | POA: Diagnosis not present

## 2019-03-12 DIAGNOSIS — R509 Fever, unspecified: Secondary | ICD-10-CM | POA: Diagnosis not present

## 2019-03-12 DIAGNOSIS — F419 Anxiety disorder, unspecified: Secondary | ICD-10-CM | POA: Diagnosis present

## 2019-03-12 DIAGNOSIS — Z9049 Acquired absence of other specified parts of digestive tract: Secondary | ICD-10-CM

## 2019-03-12 DIAGNOSIS — R1030 Lower abdominal pain, unspecified: Secondary | ICD-10-CM | POA: Diagnosis not present

## 2019-03-12 DIAGNOSIS — G453 Amaurosis fugax: Secondary | ICD-10-CM | POA: Diagnosis present

## 2019-03-12 DIAGNOSIS — D72819 Decreased white blood cell count, unspecified: Secondary | ICD-10-CM | POA: Diagnosis present

## 2019-03-12 DIAGNOSIS — Z888 Allergy status to other drugs, medicaments and biological substances status: Secondary | ICD-10-CM

## 2019-03-12 DIAGNOSIS — Z91040 Latex allergy status: Secondary | ICD-10-CM

## 2019-03-12 DIAGNOSIS — R52 Pain, unspecified: Secondary | ICD-10-CM

## 2019-03-12 DIAGNOSIS — E46 Unspecified protein-calorie malnutrition: Secondary | ICD-10-CM | POA: Diagnosis present

## 2019-03-12 DIAGNOSIS — R441 Visual hallucinations: Secondary | ICD-10-CM | POA: Diagnosis present

## 2019-03-12 DIAGNOSIS — I610 Nontraumatic intracerebral hemorrhage in hemisphere, subcortical: Secondary | ICD-10-CM | POA: Diagnosis not present

## 2019-03-12 LAB — URINE CULTURE: Culture: >=100000 — AB

## 2019-03-12 LAB — BASIC METABOLIC PANEL
Anion gap: 11 (ref 5–15)
BUN: 6 mg/dL — ABNORMAL LOW (ref 8–23)
CO2: 21 mmol/L — ABNORMAL LOW (ref 22–32)
Calcium: 9 mg/dL (ref 8.9–10.3)
Chloride: 107 mmol/L (ref 98–111)
Creatinine, Ser: 0.61 mg/dL (ref 0.44–1.00)
GFR calc Af Amer: >60 mL/min (ref >60)
GFR calc non Af Amer: >60 mL/min (ref >60)
Glucose, Bld: 110 mg/dL — ABNORMAL HIGH (ref 70–99)
Potassium: 3.7 mmol/L (ref 3.5–5.1)
Sodium: 139 mmol/L (ref 135–145)

## 2019-03-12 LAB — CBC
HCT: 42.8 % (ref 36.0–46.0)
Hemoglobin: 13.7 g/dL (ref 12.0–15.0)
MCH: 31.8 pg (ref 26.0–34.0)
MCHC: 32 g/dL (ref 30.0–36.0)
MCV: 99.3 fL (ref 80.0–100.0)
Platelets: 170 10*3/uL (ref 150–400)
RBC: 4.31 MIL/uL (ref 3.87–5.11)
RDW: 13.1 % (ref 11.5–15.5)
WBC: 5.9 10*3/uL (ref 4.0–10.5)
nRBC: 0 % (ref 0.0–0.2)

## 2019-03-12 MED ORDER — LIDOCAINE HCL URETHRAL/MUCOSAL 2 % EX GEL
CUTANEOUS | Status: DC | PRN
  Administered 2019-03-13: 5 via TOPICAL
  Filled 2019-03-12 (×4): qty 5

## 2019-03-12 MED ORDER — PROCHLORPERAZINE EDISYLATE 10 MG/2ML IJ SOLN: 5.0000 mg | Freq: Four times a day (QID) | INTRAMUSCULAR | Status: DC | PRN

## 2019-03-12 MED ORDER — DIPHENHYDRAMINE HCL 12.5 MG/5ML PO ELIX: 12.5000 mg | ORAL_SOLUTION | Freq: Four times a day (QID) | ORAL | Status: DC | PRN

## 2019-03-12 MED ORDER — SODIUM CHLORIDE 0.9 % IV SOLN: 50.0000 mL | INTRAVENOUS | 0 refills | Status: DC

## 2019-03-12 MED ORDER — PROCHLORPERAZINE MALEATE 5 MG PO TABS: 5.0000 mg | ORAL_TABLET | Freq: Four times a day (QID) | ORAL | Status: DC | PRN

## 2019-03-12 MED ORDER — ENOXAPARIN SODIUM 40 MG/0.4ML ~~LOC~~ SOLN
40.0000 mg | SUBCUTANEOUS | Status: DC
  Administered 2019-03-12 – 2019-04-03 (×20): 40 mg via SUBCUTANEOUS
  Filled 2019-03-12 (×24): qty 0.4

## 2019-03-12 MED ORDER — PANTOPRAZOLE SODIUM 40 MG PO TBEC
40.0000 mg | DELAYED_RELEASE_TABLET | Freq: Every day | ORAL | Status: DC
  Administered 2019-03-13 – 2019-04-04 (×23): 40 mg via ORAL
  Filled 2019-03-12 (×23): qty 1

## 2019-03-12 MED ORDER — FLEET ENEMA 7-19 GM/118ML RE ENEM
1.0000 | ENEMA | Freq: Once | RECTAL | Status: AC | PRN
  Administered 2019-03-13: 16:00:00 1 via RECTAL
  Filled 2019-03-12: qty 1

## 2019-03-12 MED ORDER — CHLORHEXIDINE GLUCONATE CLOTH 2 % EX PADS: 6.0000 | MEDICATED_PAD | Freq: Every day | CUTANEOUS | Status: DC

## 2019-03-12 MED ORDER — ALUM & MAG HYDROXIDE-SIMETH 200-200-20 MG/5ML PO SUSP: 30.0000 mL | ORAL | Status: DC | PRN

## 2019-03-12 MED ORDER — BISACODYL 10 MG RE SUPP
10.0000 mg | Freq: Every day | RECTAL | Status: DC | PRN
  Filled 2019-03-12: qty 1

## 2019-03-12 MED ORDER — SENNOSIDES-DOCUSATE SODIUM 8.6-50 MG PO TABS
1.0000 | ORAL_TABLET | Freq: Two times a day (BID) | ORAL | Status: DC
  Administered 2019-03-12 – 2019-03-13 (×2): 1 via ORAL
  Filled 2019-03-12 (×2): qty 1

## 2019-03-12 MED ORDER — LATANOPROST 0.005 % OP SOLN
1.0000 [drp] | Freq: Every day | OPHTHALMIC | Status: DC
  Administered 2019-03-12 – 2019-04-03 (×22): 1 [drp] via OPHTHALMIC
  Filled 2019-03-12: qty 2.5

## 2019-03-12 MED ORDER — HEPARIN SODIUM (PORCINE) 5000 UNIT/ML IJ SOLN: 5000.0000 [IU] | Freq: Two times a day (BID) | INTRAMUSCULAR | Status: DC

## 2019-03-12 MED ORDER — TRAZODONE HCL 50 MG PO TABS
25.0000 mg | ORAL_TABLET | Freq: Every evening | ORAL | Status: DC | PRN
  Filled 2019-03-12 (×4): qty 1

## 2019-03-12 MED ORDER — GUAIFENESIN-DM 100-10 MG/5ML PO SYRP: 5.0000 mL | ORAL_SOLUTION | Freq: Four times a day (QID) | ORAL | Status: DC | PRN

## 2019-03-12 MED ORDER — MUSCLE RUB 10-15 % EX CREA
TOPICAL_CREAM | Freq: Three times a day (TID) | CUTANEOUS | Status: DC
  Administered 2019-03-12: 1 via TOPICAL
  Administered 2019-03-13 – 2019-03-17 (×13): via TOPICAL
  Administered 2019-03-17: 1 via TOPICAL
  Administered 2019-03-20 – 2019-04-02 (×45): via TOPICAL
  Filled 2019-03-12: qty 85

## 2019-03-12 MED ORDER — PROCHLORPERAZINE 25 MG RE SUPP: 12.5000 mg | Freq: Four times a day (QID) | RECTAL | Status: DC | PRN

## 2019-03-12 MED ORDER — POLYETHYLENE GLYCOL 3350 17 G PO PACK
17.0000 g | PACK | Freq: Every day | ORAL | Status: DC | PRN
  Filled 2019-03-12: qty 1

## 2019-03-12 MED ORDER — ATORVASTATIN CALCIUM 10 MG PO TABS: 10.0000 mg | ORAL_TABLET | Freq: Every day | ORAL | 11 refills | Status: AC

## 2019-03-12 MED ORDER — ACETAMINOPHEN 325 MG PO TABS
325.0000 mg | ORAL_TABLET | ORAL | Status: DC | PRN
  Administered 2019-03-12 – 2019-04-03 (×19): 650 mg via ORAL
  Filled 2019-03-12 (×24): qty 2

## 2019-03-12 MED ORDER — DILTIAZEM HCL ER COATED BEADS 240 MG PO CP24
240.0000 mg | ORAL_CAPSULE | Freq: Every day | ORAL | Status: DC
  Administered 2019-03-12: 240 mg via ORAL
  Filled 2019-03-12 (×2): qty 1

## 2019-03-12 NOTE — Discharge Summary (Addendum)
Stroke Discharge Summary  Patient ID: Beth Foster   MRN: [id number]      DOB: 1928-11-20  Date of Admission: 03/08/2019 Date of Discharge: 03/12/2019  Attending Physician:  Rosalin Hawking, MD, Stroke MD Consultant(s):   None Patient's PCP:  Gayland Curry, DO  Discharge Diagnoses:  Active Problems:   ICH (intracerebral hemorrhage) (HCC) w/ IVH, L cerebellar   Pressure injury of skin   Essential hypertension   Hyperlipidemia   UTI (urinary tract infection)  Past Medical History:  Diagnosis Date  . Amaurosis fugax   . Anxiety   . Cataracts, bilateral   . Sherran Needs syndrome   . Cognitive decline   . Glaucoma   . Hypertension   . Macular degeneration of both eyes   . Stroke (cerebrum) ([place])    with decrease in [facility] of right hand.    Past Surgical History:  Procedure Laterality Date  . BREAST LUMPECTOMY  1995  . CARDIAC CATHETERIZATION  2004  . CHOLECYSTECTOMY  1972    Medications to be continued on Rehab Allergies as of 03/12/2019      Reactions   Hydrocortisone Other (See Comments)   Reaction to cream - causes blisters   Prednisone Other (See Comments)   Causes heart to race   Amoxicillin Itching, Rash   Did it involve swelling of the face/tongue/throat, SOB, or low BP? No Did it involve sudden or severe rash/hives, skin peeling, or any reaction on the inside of your mouth or nose? Yes Did you need to seek medical attention at a hospital or doctor's office? No When did it last happen?85 years old If all above answers are "NO", may proceed with cephalosporin use.   Clindamycin/lincomycin Itching, Rash   Keflex [cephalexin] Itching, Rash   Latex Rash   Reglan [metoclopramide] Itching, Rash      Medication List    TAKE these medications   acetaminophen 500 MG tablet Commonly known as: TYLENOL Take 500 mg by mouth daily as needed for headache (pain).   alendronate 70 MG tablet Commonly known as: FOSAMAX Take 70 mg by mouth every Friday.    atorvastatin 10 MG tablet Commonly known as: Lipitor Take 1 tablet (10 mg total) by mouth daily.   cholecalciferol 25 MCG (1000 UT) tablet Commonly known as: VITAMIN D3 Take 2,000 Units by mouth every morning.   Dilt-XR 240 MG 24 hr capsule Generic drug: diltiazem Take 240 mg by mouth at bedtime.   heparin 5000 UNIT/ML injection Inject 1 mL (5,000 Units total) into the skin every 12 (twelve) hours.   sodium chloride 0.9 % infusion Inject 50 mLs into the vein continuous.       LABORATORY STUDIES CBC    Component Value Date/Time   WBC 5.9 03/12/2019 0615   RBC 4.31 03/12/2019 0615   HGB 13.7 03/12/2019 0615   HCT 42.8 03/12/2019 0615   PLT 170 03/12/2019 0615   MCV 99.3 03/12/2019 0615   MCH 31.8 03/12/2019 0615   MCHC 32.0 03/12/2019 0615   RDW 13.1 03/12/2019 0615   LYMPHSABS 3.1 03/08/2019 1917   MONOABS 0.7 03/08/2019 1917   EOSABS 0.2 03/08/2019 1917   BASOSABS 0.0 03/08/2019 1917   CMP    Component Value Date/Time   NA 139 03/12/2019 0615   K 3.7 03/12/2019 0615   CL 107 03/12/2019 0615   CO2 21 (L) 03/12/2019 0615   GLUCOSE 110 (H) 03/12/2019 0615   BUN 6 (L) 03/12/2019 0615  CREATININE 0.61 03/12/2019 0615   CALCIUM 9.0 03/12/2019 0615   PROT 6.8 03/08/2019 1917   ALBUMIN 3.9 03/08/2019 1917   AST 21 03/08/2019 1917   ALT 16 03/08/2019 1917   ALKPHOS 64 03/08/2019 1917   BILITOT 0.2 (L) 03/08/2019 1917   GFRNONAA >60 03/12/2019 0615   GFRAA >60 03/12/2019 0615   COAGS Lab Results  Component Value Date   INR 1.1 03/08/2019   Lipid Panel    Component Value Date/Time   CHOL 168 03/10/2019 0619   TRIG 34 03/10/2019 0619   HDL 70 03/10/2019 0619   CHOLHDL 2.4 03/10/2019 0619   VLDL 7 03/10/2019 0619   LDLCALC 91 03/10/2019 0619   HgbA1C  Lab Results  Component Value Date   HGBA1C 5.7 (H) 03/09/2019   Urinalysis    Component Value Date/Time   COLORURINE YELLOW 03/09/2019 0039   APPEARANCEUR HAZY (A) 03/09/2019 0039   LABSPEC 1.024  03/09/2019 0039   PHURINE 7.0 03/09/2019 0039   GLUCOSEU 50 (A) 03/09/2019 0039   HGBUR NEGATIVE 03/09/2019 0039   BILIRUBINUR NEGATIVE 03/09/2019 0039   KETONESUR NEGATIVE 03/09/2019 0039   PROTEINUR NEGATIVE 03/09/2019 0039   NITRITE NEGATIVE 03/09/2019 0039   LEUKOCYTESUR LARGE (A) 03/09/2019 0039   Urine Drug Screen     Component Value Date/Time   LABOPIA NONE DETECTED 03/09/2019 0039   COCAINSCRNUR NONE DETECTED 03/09/2019 0039   LABBENZ NONE DETECTED 03/09/2019 0039   AMPHETMU NONE DETECTED 03/09/2019 0039   THCU NONE DETECTED 03/09/2019 0039   LABBARB NONE DETECTED 03/09/2019 0039    Alcohol Level    Component Value Date/Time   ETH <10 03/08/2019 1917     SIGNIFICANT DIAGNOSTIC STUDIES Ct Head Code Stroke Wo Contrast 03/08/2019 1. 2.3 cc acute hematoma within the left cerebellum with mild surrounding edema. No intraventricular or subarachnoid penetration. Elsewhere, atrophy an extensive chronic small-vessel ischemic changes.  2. ASPECTS is 10.   Ct C-spine No Charge 03/08/2019 Multilevel degenerative disc disease and facet hypertrophy throughout the cervical spine without acute osseous abnormality.   Ct Angio Head W Or Wo Contrast Ct Angio Neck W And/or Wo Contrast 03/08/2019 1. Mild atherosclerotic disease at both carotid bifurcations but no stenosis or irregularity.  2. Tortuosity and dilatation of the supraclinoid ICA on the right measuring 8 mm and on the left 7 mm.  3. No intracranial large or medium vessel occlusion or correctable proximal stenosis.  4. There is a small enhancing vessel in the region of the left cerebellar hematoma, but this is not definitely significant.   Ct Head Wo Contrast  03/09/2019 0943 Doubling of the size of a left cerebellar intraparenchymal hematoma, now measuring 2.4 x 1.6 x 1.3 cm for a volume of 5 CC. Intraventricular penetration into the fourth ventricle and cerebral aqueduct. Small amount of blood within the occipital horns  of the lateral ventricles. In retrospect, tiny focus of contrast density on the CT angiography was probably an indication of ongoing bleeding.  Ct Head Wo Contrast  03/09/19 1824 1. Hemorrhage within the medial left cerebellum and 4th ventricle is contiguous and stable from 0949 hours today. Stable trace IVH elsewhere. 2. No significant intracranial mass effect. No ventriculomegaly. 3. No new intracranial abnormality. Underlying chronic small vessel disease.  Mr Brain Wo Contrast 03/10/2019 There does not appear to be enlargement of the left cerebellar hematoma, as best as can be compared across modalities. Intraventricular penetration as shown by CT with blood in the fourth ventricle and dependent in  the lateral ventricles. Ventricular size is stable. Extensive chronic small-vessel ischemic changes elsewhere throughout the brain, many associated with hemosiderin deposition, consistent with hypertensive etiology.   Dg Pelvis Portable 03/08/2019 No acute osseous abnormality identified. Osseous structures are diffusely demineralized. If a high clinical suspicion for occult fracture persists, MRI could be considered.  Dg Sacrum/coccyx 03/09/2019 Limited evaluation as excreted IV contrast in the urinary bladder obscures the AP views. Allowing for this, no evidence of sacral or coccygeal fracture.   Transthoracic Echocardiogram  1. Left ventricular ejection fraction, by visual estimation, is 55 to 60%. The left ventricle has normal function. Normal left ventricular size. There is mildly increased left ventricular hypertrophy. 2. Left ventricular diastolic Doppler parameters are consistent with impaired relaxation pattern of LV diastolic filling. 3. Global right ventricle has normal systolic function.The right ventricular size is normal. No increase in right ventricular wall thickness. 4. Left atrial size was normal. 5. Right atrial size was normal. 6. Presence of pericardial fat pad. 7.  Trivial pericardial effusion is present. 8. The pericardial effusion is posterior to the left ventricle. 9. Mild mitral annular calcification. 10. The mitral valve is grossly normal. Mild mitral valve regurgitation. 11. The tricuspid valve is grossly normal. Tricuspid valve regurgitation is trivial. 12. The aortic valve is tricuspid Aortic valve regurgitation was not visualized by color flow Doppler. Mild aortic valve sclerosis without stenosis. 13. The pulmonic valve was grossly normal. Pulmonic valve regurgitation is mild by color flow Doppler.  ECG - SR rate 82 BPM. (See cardiology reading for complete details)   HISTORY OF PRESENT ILLNESS Beth Foster is a 85 y.o. female with a history of hypertension who presents with unsteady gait and right gaze deviation started sometime in the past 2 days.  She states that she got up around 3 AM and made some oats and she felt normal then, but is very unclear about when this exactly started after that.  She was found by family today brought into the emergency department where CT revealed a small cerebellar peduncle hemorrhage. Documented LKW was 0300, date not included. ICH Score: 2. She was admitted to to the neuro ICU for further evaluation and treatment.   HOSPITAL COURSE Ms. Beth Foster is a 85 y.o. female with history of hypertension who presents with unsteady gait and right gaze deviation started sometime in the past 2 days.   ICH - left cerebellar peduncle ICH with small amount of IVH  Code Stroke CT Head - 2.3 cc acute hematoma within the left cerebellum with mild surrounding edema.   CT head - hematoma expansion to 5cc  CTA H&N - Tortuosity and dilatation of the supraclinoid ICA on the right measuring 8 mm and on the left 7 mm.   CT repeat 6pm 10/17 - stable hematoma  MRI brain stable hematoma and no evidence of CAA   2D Echo - EF 55 - 60%. No cardiac source of emboli identified.   [employer] Virus 2 - negative  LDL -  91  HgbA1c - 5.7  UDS - negative  VTE prophylaxis - SCDs  No antithrombotic prior to admission, now on No antithrombotic  Therapy recommendations:  CIR  Disposition:  CIR  Hypertension  Home BP meds: Diltiazem XR 240 mg daily  Stable  Treated with cleviprex in ICU following admission, now off  Put back on diltiazem   SBP goal < 160 mm Hg   Long-term BP goal normotensive  Hyperlipidemia  Home Lipid lowering medication: none  LDL - 91, goal < 70  Add low dose statin on d/c  Other Stroke Risk Factors  Advanced age  Other Active Problems  Hypokalemia - 3.1->3.2->3.6->3.2- supplement - 3.7  Possible UTI - UA with WBC 6-10,  urine culture-> Entercoccus faecalis - on Bactrim for 3 days then fosfomycin x 1  Sacrum stage I pressure injury medial aspect present on admission, deep tissue injury also present   DISCHARGE EXAM Blood pressure 93/61, pulse 87, temperature 97.9 F (36.6 C), temperature source Oral, resp. rate 15, height 5\' 5"  (1.651 m), weight 63.8 kg, SpO2 96 %. General - Well nourished, well developed, not in acute distress  Ophthalmologic - fundi not visualized due to noncooperation.  Cardiovascular - Regular rhythm and rate.  Neuro - awake alert interactive and follow simple command, knows her name, place, month and age, but not orientated to year. Able to repeat and name but mild dysarthria. PERRL, no obvious nystagmus. Blinking to visual threat bilaterally. No facial droop, tongue midline. BUE 4/5 and BLE 3/5, effort related. Sensation symmetrical, and FTN slow with mild dysmetria on the left. Gait not tested  Discharge Diet  Dysphagia 3 thin liquids  DISCHARGE PLAN  Disposition:  Transfer to [place] for ongoing PT, OT and ST  Due to hemorrhage and risk of bleeding, do not take aspirin, aspirin-containing medications, or ibuprofen products   Recommend ongoing stroke risk factor control by Primary Care Physician at  time of discharge from inpatient rehabilitation.  Follow-up Reed, Tiffany L, DO in 2 weeks following discharge from rehab.  Follow-up in [facility] in 4 weeks following discharge from rehab, office to schedule an appointment.   30 minutes were spent preparing discharge.  Rosalin Hawking, MD PhD Stroke Neurology 03/12/2019 6:07 PM

## 2019-03-12 NOTE — Progress Notes (Signed)
Jamse Arn, MD  Physician  Physical Medicine and Rehabilitation  PMR Pre-admission  Signed  Date of Service:  03/12/2019 11:55 AM          Signed         Show:Clear all '[x]'$ Manual'[x]'$ Template'[x]'$ Copied  Added by: '[x]'$ Posey Pronto, Domenick Bookbinder, MD'[x]'$ Michel Santee, PT  '[]'$ Hover for details PMR Admission Coordinator Pre-Admission Assessment  Patient: Beth Foster is an 85 y.o., female MRN: [id number] DOB: 1928-05-30 Height: '5\' 5"'$  (165.1 cm) Weight: 63.8 kg  Insurance Information HMO:     PPO: yes     PCP:      IPA:      80/20:      OTHER:  PRIMARY: [employer] Medicare      Policy#: [id number]      Subscriber: patient CM Name: Aldona Bar      Phone#:      Fax#: [phone] Pre-Cert#: [id number] [place] for CIR admission provided by Aldona Bar at [place]. Updates due to fax listed above on 10/26.      Employer:  Benefits:  Phone #: [phone]     Name:  Eff. Date: 05/23/2018     Deduct: $0      Out of Pocket Max: $3900 [id number] met)      Life Max: n/a CIR: $345/day for days 1-5      SNF: 20 full days Outpatient:      Co-Pay: $40/visit Home Health: 100%      Co-Pay:  DME: 80%     Co-Pay: 20% Providers: preferred network  SECONDARY:       Policy#:       Subscriber:  CM Name:       Phone#:      Fax#:  Pre-Cert#:       Employer:  Benefits:  Phone #:      Name:  Eff. Date:      Deduct:       Out of Pocket Max:       Life Max:  CIR:       SNF:  Outpatient:      Co-Pay:  Home Health:       Co-Pay:  DME:      Co-Pay:   Medicaid Application Date:       Case Manager:  Disability Application Date:       Case Worker:   The "Data Collection Information Summary" for patients in Inpatient Rehabilitation Facilities with attached "Privacy Act [place] Records" was provided and verbally reviewed with: Patient and Family  Emergency Contact Information         Contact Information    Name Relation Home Work Mobile   Pantazahias,Ana Granddaughter   [phone]       Current Medical History  Patient Admitting Diagnosis: L cerebellar ICH with vertigo  History of Present Illness: Pt is a 85 y/o female with PMH of HTN who presented to [facility] on 10/16 with 2 day history of unsteady gait and R gaze preference.  She was found by family on day of admission and brought to ED where CT revealed a small cerebellar peduncle hemorrhage.  CTA tortuosity and dilatation of supraclinoid ICA on the R>L.  Repeat CT and MRI on 10/17 showed stable hematoma. No cardiac source of emobli identified.  Permissive hypertension for perfusion.  Potassium supplement for hypokalemia.  Therapy evaluations were completed and pt was recommended for CIR.   Complete NIHSS TOTAL: 1  Patient's medical record from [facility] has been reviewed by the rehabilitation admission  coordinator and physician.  Past Medical History      Past Medical History:  Diagnosis Date  . Hypertension     Family History   family history is not on file.  Prior Rehab/Hospitalizations Has the patient had prior rehab or hospitalizations prior to admission? No  Has the patient had major surgery during 100 days prior to admission? No              Current Medications  Current Facility-Administered Medications:  .  0.9 %  sodium chloride infusion, , Intravenous, Continuous, Rosalin Hawking, MD, Last Rate: 50 mL/hr at 03/12/19 0800 .  acetaminophen (TYLENOL) tablet 650 mg, 650 mg, Oral, Q4H PRN, 650 mg at 03/10/19 2236 **OR** [DISCONTINUED] acetaminophen (TYLENOL) solution 650 mg, 650 mg, Per Tube, Q4H PRN **OR** [DISCONTINUED] acetaminophen (TYLENOL) suppository 650 mg, 650 mg, Rectal, Q4H PRN, Greta Doom, MD .  Chlorhexidine Gluconate Cloth 2 % PADS 6 each, 6 each, Topical, Daily, Greta Doom, MD, 6 each at 03/12/19 1055 .  diltiazem (CARDIZEM CD) 24 hr capsule 240 mg, 240 mg, Oral, QHS, Rosalin Hawking, MD, 240 mg at 03/11/19 2230 .  heparin injection 5,000 Units, 5,000  Units, Subcutaneous, Q12H, Rosalin Hawking, MD, 5,000 Units at 03/12/19 1054 .  labetalol (NORMODYNE) injection 5-20 mg, 5-20 mg, Intravenous, Q2H PRN, Rosalin Hawking, MD, 20 mg at 03/11/19 1555 .  ondansetron (ZOFRAN) injection 4 mg, 4 mg, Intravenous, Q6H PRN, Rosalin Hawking, MD, 4 mg at 03/09/19 1111 .  pantoprazole (PROTONIX) EC tablet 40 mg, 40 mg, Oral, Daily, Rosalin Hawking, MD, 40 mg at 03/12/19 1054 .  senna-docusate (Senokot-S) tablet 1 tablet, 1 tablet, Oral, BID, Greta Doom, MD, 1 tablet at 03/12/19 1054  Patients Current Diet:     Diet Order                  DIET DYS 3 Room service appropriate? Yes with Assist; Fluid consistency: Thin  Diet effective now               Precautions / Restrictions Precautions Precautions: Fall Precaution Comments: SBP <160, incontinent Restrictions Weight Bearing Restrictions: No   Has the patient had 2 or more falls or a fall with injury in the past year? Yes  Prior Activity Level Limited Community (1-2x/wk): went out for appointments, less time outside of the house 2/2 COVID  Prior Functional Level Self Care: Did the patient need help bathing, dressing, using the toilet or eating? Independent  Indoor Mobility: Did the patient need assistance with walking from room to room (with or without device)? Independent  Stairs: Did the patient need assistance with internal or external stairs (with or without device)? Independent  Functional Cognition: Did the patient need help planning regular tasks such as shopping or remembering to take medications? Needed some help  Home Assistive Devices / Equipment Home Assistive Devices/Equipment: Eyeglasses, Dentures (specify type), Walker (specify type), Cane (specify quad or straight) Home Equipment: Shower seat, Grab bars - tub/shower, Hand held shower head  Prior Device Use: Indicate devices/aids used by the patient prior to current illness, exacerbation or injury?  cane  Current Functional Level Cognition  Arousal/Alertness: Awake/alert Overall Cognitive Status: No family/caregiver present to determine baseline cognitive functioning Current Attention Level: Sustained Orientation Level: Oriented to person, Oriented to place, Disoriented to time Following Commands: Follows one step commands inconsistently, Follows one step commands with increased time Safety/Judgement: Decreased awareness of safety, Decreased awareness of deficits General Comments: pt with decreased awareness of  incontinence and management, pt with slow processing for commands for transfers and safety Attention: Sustained Sustained Attention: Appears intact Memory: Impaired Memory Impairment: Decreased recall of new information, Storage deficit Awareness: Impaired Awareness Impairment: Anticipatory impairment Problem Solving: Impaired Problem Solving Impairment: Functional basic(Call bell ) Executive Function: Reasoning, Sequencing Reasoning: Impaired Reasoning Impairment: Verbal basic Initiating: Appears intact Safety/Judgment: Impaired    Extremity Assessment (includes Sensation/Coordination)  Upper Extremity Assessment: Generalized weakness  Lower Extremity Assessment: Defer to PT evaluation RLE Sensation: decreased light touch(reports lower leg feels "bumpy" with light touch assessment) RLE Coordination: WNL LLE Coordination: WNL    ADLs  Overall ADL's : Needs assistance/impaired Eating/Feeding: Set up, Sitting Grooming: Wash/dry hands, Set up, Sitting Grooming Details (indicate cue type and reason): Pt washing her hands while seated with hand sanitizer. Upper Body Bathing: Minimal assistance, Sitting Lower Body Bathing: Maximal assistance, Sit to/from stand Upper Body Dressing : Minimal assistance, Sitting Upper Body Dressing Details (indicate cue type and reason): Min A for donning new gown Lower Body Dressing: Maximal assistance, Sit to/from stand Lower Body  Dressing Details (indicate cue type and reason): Max A to don socks. Pt attempting to bring ankles to knees but unable to perform successfully stating "this is much harder than when I sit on my toilet." Toilet Transfer: Minimal assistance, +2 for physical assistance, RW, BSC Toilet Transfer Details (indicate cue type and reason): Pt requiring Min A +2 to power up into standing and to maintain balance throughout.  Toileting- Clothing Manipulation and Hygiene: Maximal assistance, Minimal assistance, Sit to/from stand, Sitting/lateral lean Toileting - Clothing Manipulation Details (indicate cue type and reason): Pt performing peri care while seated on BSC with cues for lateral lean. Requiring Min A for safety and balance during lateral lean. Pt requiring Max A for peri care while in standing Functional mobility during ADLs: Minimal assistance, +2 for physical assistance, Rolling walker, Cueing for sequencing, Cueing for safety General ADL Comments: Pt with decreased strength, balance, safety, and cognition impacting his safe performance of ADLs.    Mobility  Overal bed mobility: Needs Assistance Bed Mobility: Supine to Sit Rolling: Min guard Sidelying to sit: Mod assist, HOB elevated Supine to sit: Min assist, HOB elevated Sit to sidelying: Mod assist General bed mobility comments: minguard for safety with slight min assist once EOB for balance with left lean    Transfers  Overall transfer level: Needs assistance Equipment used: Rolling walker (2 wheeled) Transfers: Sit to/from Stand, Stand Pivot Transfers Sit to Stand: Min assist, +2 physical assistance, +2 safety/equipment Stand pivot transfers: Min assist, +2 safety/equipment General transfer comment: Min A +2 to power up into standing. Requiring cues for hand placement and positioning. Pt with left lean with initial stand from bed with bil UE support. Stood additional 2x from [facility] with cues for hand placement and RW present for stability  with no lean with RW    Ambulation / Gait / Stairs / Wheelchair Mobility  Ambulation/Gait Ambulation/Gait assistance: Min assist, +2 safety/equipment Gait Distance (Feet): 3 Feet Assistive device: Rolling walker (2 wheeled) Gait Pattern/deviations: Shuffle, Narrow base of support General Gait Details: pt with narrow BOS, assist to direct and advance RW with pt only able to walk a few feet limited by incontinence with assist for lines Gait velocity interpretation: <1.8 ft/sec, indicate of risk for recurrent falls    Posture / Balance Dynamic Sitting Balance Sitting balance - Comments: minguard EOB with intial left lean able to correct to midline, guarding at [facility] Balance Overall balance  assessment: Needs assistance Sitting-balance support: No upper extremity supported, Feet supported Sitting balance-Leahy Scale: Poor Sitting balance - Comments: minguard EOB with intial left lean able to correct to midline, guarding at [facility] Standing balance support: Bilateral upper extremity supported, During functional activity Standing balance-Leahy Scale: Poor    Special needs/care consideration BiPAP/CPAP no CPM no Continuous Drip IV no Dialysis no        Days n/a Life Vest no Oxygen no Special Bed no Trach Size no Wound Vac (area) no      Location n/a Skin              Pressure Injury 03/08/19 Sacrum Medial Stage I -  Intact skin with non-blanchable redness of a localized area usually over a bony prominence.  Date First Assessed/Time First Assessed: 03/08/19 2115   Location: Sacrum  Location Orientation: Medial  Staging: Stage I -  Intact skin with non-blanchable redness of a localized area usually over a bony prominence.  Present on Admission: Yes  Dressing Type Foam - Lift dressing to assess site every shift  Dressing Clean;Dry;Intact  Dressing Change Frequency Every 3 days  Site / Wound Assessment Clean;Dry  Peri-wound Assessment Intact  Drainage Amount None  Pressure Injury 03/08/19  Sacrum Mid Deep Tissue Injury - Purple or maroon localized area of discolored intact skin or blood-filled blister due to damage of underlying soft tissue from pressure and/or shear.  Date First Assessed/Time First Assessed: 03/08/19 2151   Location: Sacrum  Location Orientation: Mid  Staging: Deep Tissue Injury - Purple or maroon localized area of discolored intact skin or blood-filled blister due to damage of underlying soft tiss...  Dressing Type Foam - Lift dressing to assess site every shift  Dressing Clean;Dry;Intact  Site / Wound Assessment Clean;Dry  Peri-wound Assessment Intact  Margins Attached edges (approximated)  Drainage Amount None                     Bowel mgmt: incontinent, last BM prior to admission to the hospital on 10/16 Bladder mgmt: incontinent, purewick Diabetic mgmt: no Behavioral consideration no Chemo/radiation no   Previous Home Environment (from acute therapy documentation) Living Arrangements: Alone  Lives With: Alone Available Help at Discharge: Family([place]) Type of Home: House Home Layout: One level Home Access: Stairs to enter Entrance Stairs-Rails: Right Entrance Stairs-Number of Steps: 3 Bathroom Shower/Tub: Chiropodist: [place]: No Additional Comments: layout is for grandaughter's home (she plans for pt to come there)  Discharge Living Setting Plans for Discharge Living Setting: Lives with (comment)(grand daughter and her husband) Type of Home at Discharge: House Discharge Home Layout: Two level, Bed/bath upstairs Alternate Level Stairs-Rails: Right Alternate Level Stairs-Number of Steps: 15 Discharge Home Access: Stairs to enter Entrance Stairs-Rails: None(can install) Entrance Stairs-Number of Steps: 3 Discharge Bathroom Shower/Tub: Tub/shower unit Discharge Bathroom Toilet: Standard Discharge Bathroom Accessibility: Yes How Accessible: Accessible via walker Does the patient have any  problems obtaining your medications?: No  Social/Family/Support Systems Anticipated Caregiver: grand daughter, Angie Fava  Anticipated Caregiver's Contact Information: [phone] Caregiver Availability: 24/7 Discharge Plan Discussed with Primary Caregiver: Yes Is Caregiver In Agreement with Plan?: Yes Does Caregiver/Family have Issues with Lodging/Transportation while Pt is in Rehab?: No  Goals/Additional Needs Patient/Family Goal for Rehab: PT/OT/SLP supervision Expected length of stay: 10-12 days Dietary Needs: dys 3 thin Equipment Needs: tbd Additional Information: has baseline early dementia Pt/Family Agrees to Admission and willing to participate: Yes Program Orientation Provided & Reviewed with  Pt/Caregiver Including Roles  & Responsibilities: Yes  Decrease burden of Care through IP rehab admission: n/a  Possible need for SNF placement upon discharge: not anticipated.   Patient Condition: I have reviewed medical records from [facility], spoken with CM, and patient and family member. I met with patient at the bedside for inpatient rehabilitation assessment.  Patient will benefit from ongoing PT, OT and SLP, can actively participate in 3 hours of therapy a day 5 days of the week, and can make measurable gains during the admission.  Patient will also benefit from the coordinated team approach during an Inpatient Acute Rehabilitation admission.  The patient will receive intensive therapy as well as Rehabilitation physician, nursing, social worker, and care management interventions.  Due to bladder management, bowel management, safety, skin/wound care, medication administration, pain management and patient education the patient requires 24 hour a day rehabilitation nursing.  The patient is currently min assist with mobility and basic ADLs.  Discharge setting and therapy post discharge at home with home health is anticipated.  Patient has agreed to participate in the  Acute Inpatient Rehabilitation Program and will admit today.  Preadmission Screen Completed By:  Michel Santee, PT, DPT 03/12/2019 11:55 AM ______________________________________________________________________   Discussed status with Dr. Posey Pronto on 03/12/19 at 12:01 PM  and received approval for admission today.  Admission Coordinator:  Michel Santee, PT, DPT time 12:01 PM Sudie Grumbling 03/12/19    Assessment/Plan: Diagnosis: L cerebellar ICH   1. Does the need for close, 24 hr/day Medical supervision in concert with the patient's rehab needs make it unreasonable for this patient to be served in a less intensive setting? Yes 2. Co-Morbidities requiring supervision/potential complications: HTN, labile blood pressure 3. Due to safety, disease management, medication administration and patient education, does the patient require 24 hr/day rehab nursing? Yes 4. Does the patient require coordinated care of a physician, rehab nurse, PT, OT, and SLP to address physical and functional deficits in the context of the above medical diagnosis(es)? Yes Addressing deficits in the following areas: balance, endurance, locomotion, strength, transferring, bathing, dressing, toileting, cognition and psychosocial support 5. Can the patient actively participate in an intensive therapy program of at least 3 hrs of therapy 5 days a week? Yes 6. The potential for patient to make measurable gains while on inpatient rehab is excellent 7. Anticipated functional outcomes upon discharge from inpatient rehab: supervision PT, supervision OT, supervision SLP 8. Estimated rehab length of stay to reach the above functional goals is: 10-14 days. 9. Anticipated discharge destination: Home 10. Overall Rehab/Functional Prognosis: good   MD Signature: Delice Lesch, MD, ABPMR        Revision History

## 2019-03-12 NOTE — IPOC Note (Signed)
Individualized overall Plan of Care [facility]) Patient Details Name: Beth Foster MRN: [id number] DOB: 1929-01-24  Admitting Diagnosis: ICH (intracerebral hemorrhage) [facility])  Hospital Problems: Principal Problem:   ICH (intracerebral hemorrhage) ([place]) w/ IVH, L cerebellar Active Problems:   Hypoalbuminemia due to protein-calorie malnutrition ([place])   Urinary retention   Benign essential HTN     Functional Problem List: Nursing Bladder, Bowel, Endurance, Motor, Safety, Nutrition, Pain  PT Balance, Safety, Endurance, Motor  OT Balance, Cognition, Endurance, Motor, Vision, Safety, Pain  SLP Cognition  TR         Basic ADL's: OT Eating, Grooming, Bathing, Dressing, Toileting     Advanced  ADL's: OT       Transfers: PT Bed Mobility, Bed to Chair, Car, Manufacturing systems engineer, Metallurgist: PT Ambulation, Emergency planning/management officer, Stairs     Additional Impairments: OT None  SLP Communication, Social Cognition expression Problem Solving, Memory, Attention, Awareness  TR      Anticipated Outcomes Item Anticipated Outcome  Self Feeding modified independent  Swallowing      Basic self-care  supervision to min assist  Toileting  supervision   Bathroom Transfers supervision  Bowel/Bladder  continent of bowel and bladder with min assist  Transfers  S baric tra, min assist car  Locomotion  S gait x 50' wiht LRAD, S w/c x 50' , S up/down 4 steps bil rails  Communication  Supervision A  Cognition  Min-Supervision A  Pain  pain less than 3 on scale of 0-10  Safety/Judgment  remain free of injusry. prevent falls with cues and reminders.   Therapy Plan: PT Intensity: Minimum of 1-2 x/day ,45 to 90 minutes PT Frequency: 5 out of 7 days PT Duration Estimated Length of Stay: 14-17 OT Intensity: Minimum of 1-2 x/day, 45 to 90 minutes OT Frequency: 5 out of 7 days OT Duration/Estimated Length of Stay: 12-14 days SLP Intensity: Minumum of 1-2 x/day, 30 to 90  minutes SLP Frequency: 3 to 5 out of 7 days SLP Duration/Estimated Length of Stay: 10-14 days    Team Interventions: Nursing Interventions Patient/Family Education, Bladder Management, Medication Management, Bowel Management, Skin Care/Wound Management, Disease Management/Prevention, Cognitive Remediation/Compensation, Psychosocial Support  PT interventions Ambulation/gait training, Cognitive remediation/compensation, Discharge planning, DME/adaptive equipment instruction, Pain management, Functional mobility training, Psychosocial support, Splinting/orthotics, Therapeutic Activities, UE/LE Strength taining/ROM, Visual/perceptual remediation/compensation, Training and development officer, Academic librarian, Neuromuscular re-education, Barrister's clerk education, IT trainer, Therapeutic Exercise, UE/LE Coordination activities, Wheelchair propulsion/positioning  OT Interventions Training and development officer, Discharge planning, Pain management, Therapeutic Activities, Self Care/advanced ADL retraining, UE/LE Coordination activities, Cognitive remediation/compensation, Disease mangement/prevention, Functional mobility training, Patient/family education, Therapeutic Exercise, Visual/perceptual remediation/compensation, Wheelchair propulsion/positioning, UE/LE Strength taining/ROM, Neuromuscular re-education, DME/adaptive equipment instruction, Community reintegration  SLP Interventions Cognitive remediation/compensation, English as a second language teacher, Functional tasks, Internal/external aids, Patient/family education, Speech/Language facilitation  TR Interventions    SW/CM Interventions Discharge Planning, Psychosocial Support, Patient/Family Education   Barriers to Discharge MD  Medical stability  Nursing      PT      OT      SLP      SW       Team Discharge Planning: Destination: PT-Home ,OT- Home , SLP-Home Projected Follow-up: PT-Home health PT, OT-  Home health OT, 24 hour supervision/assistance,  SLP-Home Health SLP, 24 hour supervision/assistance Projected Equipment Needs: PT-To be determined, OT- To be determined, SLP-None recommended by SLP Equipment Details: PT- , OT-  Patient/family involved in discharge planning: PT- Patient,  OT-Patient, SLP-Patient  MD ELOS:  14-17 days. Medical Rehab Prognosis:  Good Assessment: 85 year old female with history of HTN,  prior stroke with decreased Caro RUE, glaucoma, macular degeneration with worsening of vision and recent reports of amaurosis fugax with reports of 2 day history of unsteady gait and right gaze preference.  She was admitted on 03/08/2019 after found down and CT head done revealing small cerebellar peduncle hemorrhage with mild surrounding edema.  She noted she was having dysuria and was started on antibiotics for Enterococcus faecalis CTA head/neck showed tortuosity and dilatation of supraclinoid ICA and no large vessel occlusion. Follow up CT head showed stable left medial cerebellar hemorrhage with extension into left ventricle.  No mass-effect noted.  MRI brain showed stable left cerebellar hematoma with penetration of 4th ventricle and dependent within occipital horns and lateral ventricles. Extensive chronic small vessel disease noted.  Echocardiogram with ejection fraction of 55-60%.  Mild increase in LVH and trivial pericardial effusion.  She has had reports of lower back pain and pelvic/sacral X rays negative but diffuse demineralization noted.  Hospital course complicated by lethargy, confusion, agitation.  Family reports confusion has progressively gotten worse and she received dose of Fosfomycin today. Therapy ongoing and patient with balance deficits with left lean as well as cognitive deficits affecting ADLs and mobility.  We will set goals for Supervision with most tasks with PT/OT and with SLP.  Due to the current state of emergency, patients may not be receiving their 3-hours of Medicare-mandated therapy.  See Team Conference  Notes for weekly updates to the plan of care

## 2019-03-12 NOTE — Progress Notes (Signed)
Pt admitted to room 4W17. Oriented to floor, fall policy and reviewed visitors policy with granddaughter, Vicente Males. Continue plan of care. Gerald Stabs, RN

## 2019-03-12 NOTE — PMR Pre-admission (Signed)
PMR Admission Coordinator Pre-Admission Assessment  Patient: Beth Foster is an 85 y.o., female MRN: [id number] DOB: [birth year] Height: '5\' 5"'$  (165.1 cm) Weight: 63.8 kg  Insurance Information HMO:     PPO: yes     PCP:      IPA:      80/20:      OTHER:  PRIMARY: [employer] Medicare      Policy#: [id number]      Subscriber: patient CM Name: Aldona Bar      Phone#:      Fax#: [phone] Pre-Cert#: [id number] [place] for CIR admission provided by Aldona Bar at [facility]. Updates due to fax listed above on 10/26.      Employer:  Benefits:  Phone #: [phone]     Name:  Eff. Date: 05/23/2018     Deduct: $0      Out of Pocket Max: $3900 [phone] met)      Life Max: n/a CIR: $345/day for days 1-5      SNF: 20 full days Outpatient:      Co-Pay: $40/visit Home Health: 100%      Co-Pay:  DME: 80%     Co-Pay: 20% Providers: preferred network  SECONDARY:       Policy#:       Subscriber:  CM Name:       Phone#:      Fax#:  Pre-Cert#:       Employer:  Benefits:  Phone #:      Name:  Eff. Date:      Deduct:       Out of Pocket Max:       Life Max:  CIR:       SNF:  Outpatient:      Co-Pay:  Home Health:       Co-Pay:  DME:      Co-Pay:   Medicaid Application Date:       Case Manager:  Disability Application Date:       Case Worker:   The "Data Collection Information Summary" for patients in Inpatient Rehabilitation Facilities with attached "Privacy Act [place] Records" was provided and verbally reviewed with: Patient and Family  Emergency Contact Information Contact Information    Name Relation Home Work Mobile   Pantazahias,Ana Granddaughter   [phone]      Current Medical History  Patient Admitting Diagnosis: L cerebellar ICH with vertigo  History of Present Illness: Pt is a 85 y/o female with PMH of HTN who presented to [facility] on 10/16 with 2 day history of unsteady gait and R gaze preference.  She was found by family on day of admission and brought to ED where CT revealed a  small cerebellar peduncle hemorrhage.  CTA tortuosity and dilatation of supraclinoid ICA on the R>L.  Repeat CT and MRI on 10/17 showed stable hematoma. No cardiac source of emobli identified.  Permissive hypertension for perfusion.  Potassium supplement for hypokalemia.  Therapy evaluations were completed and pt was recommended for CIR.   Complete NIHSS TOTAL: 1  Patient's medical record from [facility] has been reviewed by the rehabilitation admission coordinator and physician.  Past Medical History  Past Medical History:  Diagnosis Date  . Hypertension     Family History   family history is not on file.  Prior Rehab/Hospitalizations Has the patient had prior rehab or hospitalizations prior to admission? No  Has the patient had major surgery during 100 days prior to admission? No   Current Medications  Current Facility-Administered  Medications:  .  0.9 %  sodium chloride infusion, , Intravenous, Continuous, Rosalin Hawking, MD, Last Rate: 50 mL/hr at 03/12/19 0800 .  acetaminophen (TYLENOL) tablet 650 mg, 650 mg, Oral, Q4H PRN, 650 mg at 03/10/19 2236 **OR** [DISCONTINUED] acetaminophen (TYLENOL) solution 650 mg, 650 mg, Per Tube, Q4H PRN **OR** [DISCONTINUED] acetaminophen (TYLENOL) suppository 650 mg, 650 mg, Rectal, Q4H PRN, Greta Doom, MD .  Chlorhexidine Gluconate Cloth 2 % PADS 6 each, 6 each, Topical, Daily, Greta Doom, MD, 6 each at 03/12/19 1055 .  diltiazem (CARDIZEM CD) 24 hr capsule 240 mg, 240 mg, Oral, QHS, Rosalin Hawking, MD, 240 mg at 03/11/19 2230 .  heparin injection 5,000 Units, 5,000 Units, Subcutaneous, Q12H, Rosalin Hawking, MD, 5,000 Units at 03/12/19 1054 .  labetalol (NORMODYNE) injection 5-20 mg, 5-20 mg, Intravenous, Q2H PRN, Rosalin Hawking, MD, 20 mg at 03/11/19 1555 .  ondansetron (ZOFRAN) injection 4 mg, 4 mg, Intravenous, Q6H PRN, Rosalin Hawking, MD, 4 mg at 03/09/19 1111 .  pantoprazole (PROTONIX) EC tablet 40 mg, 40 mg, Oral, Daily, Rosalin Hawking, MD, 40 mg at 03/12/19 1054 .  senna-docusate (Senokot-S) tablet 1 tablet, 1 tablet, Oral, BID, Greta Doom, MD, 1 tablet at 03/12/19 1054  Patients Current Diet:  Diet Order            DIET DYS 3 Room service appropriate? Yes with Assist; Fluid consistency: Thin  Diet effective now              Precautions / Restrictions Precautions Precautions: Fall Precaution Comments: SBP <160, incontinent Restrictions Weight Bearing Restrictions: No   Has the patient had 2 or more falls or a fall with injury in the past year? Yes  Prior Activity Level Limited Community (1-2x/wk): went out for appointments, less time outside of the house 2/2 COVID  Prior Functional Level Self Care: Did the patient need help bathing, dressing, using the toilet or eating? Independent  Indoor Mobility: Did the patient need assistance with walking from room to room (with or without device)? Independent  Stairs: Did the patient need assistance with internal or external stairs (with or without device)? Independent  Functional Cognition: Did the patient need help planning regular tasks such as shopping or remembering to take medications? Needed some help  Home Assistive Devices / Equipment Home Assistive Devices/Equipment: Eyeglasses, Dentures (specify type), Walker (specify type), Cane (specify quad or straight) Home Equipment: Shower seat, Grab bars - tub/shower, Hand held shower head  Prior Device Use: Indicate devices/aids used by the patient prior to current illness, exacerbation or injury? cane  Current Functional Level Cognition  Arousal/Alertness: Awake/alert Overall Cognitive Status: No family/caregiver present to determine baseline cognitive functioning Current Attention Level: Sustained Orientation Level: Oriented to person, Oriented to place, Disoriented to time Following Commands: Follows one step commands inconsistently, Follows one step commands with increased  time Safety/Judgement: Decreased awareness of safety, Decreased awareness of deficits General Comments: pt with decreased awareness of incontinence and management, pt with slow processing for commands for transfers and safety Attention: Sustained Sustained Attention: Appears intact Memory: Impaired Memory Impairment: Decreased recall of new information, Storage deficit Awareness: Impaired Awareness Impairment: Anticipatory impairment Problem Solving: Impaired Problem Solving Impairment: Functional basic(Call bell ) Executive Function: Reasoning, Sequencing Reasoning: Impaired Reasoning Impairment: Verbal basic Initiating: Appears intact Safety/Judgment: Impaired    Extremity Assessment (includes Sensation/Coordination)  Upper Extremity Assessment: Generalized weakness  Lower Extremity Assessment: Defer to PT evaluation RLE Sensation: decreased light touch(reports lower leg feels "bumpy" with  light touch assessment) RLE Coordination: WNL LLE Coordination: WNL    ADLs  Overall ADL's : Needs assistance/impaired Eating/Feeding: Set up, Sitting Grooming: Wash/dry hands, Set up, Sitting Grooming Details (indicate cue type and reason): Pt washing her hands while seated with hand sanitizer. Upper Body Bathing: Minimal assistance, Sitting Lower Body Bathing: Maximal assistance, Sit to/from stand Upper Body Dressing : Minimal assistance, Sitting Upper Body Dressing Details (indicate cue type and reason): Min A for donning new gown Lower Body Dressing: Maximal assistance, Sit to/from stand Lower Body Dressing Details (indicate cue type and reason): Max A to don socks. Pt attempting to bring ankles to knees but unable to perform successfully stating "this is much harder than when I sit on my toilet." Toilet Transfer: Minimal assistance, +2 for physical assistance, RW, BSC Toilet Transfer Details (indicate cue type and reason): Pt requiring Min A +2 to power up into standing and to maintain  balance throughout.  Toileting- Clothing Manipulation and Hygiene: Maximal assistance, Minimal assistance, Sit to/from stand, Sitting/lateral lean Toileting - Clothing Manipulation Details (indicate cue type and reason): Pt performing peri care while seated on BSC with cues for lateral lean. Requiring Min A for safety and balance during lateral lean. Pt requiring Max A for peri care while in standing Functional mobility during ADLs: Minimal assistance, +2 for physical assistance, Rolling walker, Cueing for sequencing, Cueing for safety General ADL Comments: Pt with decreased strength, balance, safety, and cognition impacting his safe performance of ADLs.    Mobility  Overal bed mobility: Needs Assistance Bed Mobility: Supine to Sit Rolling: Min guard Sidelying to sit: Mod assist, HOB elevated Supine to sit: Min assist, HOB elevated Sit to sidelying: Mod assist General bed mobility comments: minguard for safety with slight min assist once EOB for balance with left lean    Transfers  Overall transfer level: Needs assistance Equipment used: Rolling walker (2 wheeled) Transfers: Sit to/from Stand, Stand Pivot Transfers Sit to Stand: Min assist, +2 physical assistance, +2 safety/equipment Stand pivot transfers: Min assist, +2 safety/equipment General transfer comment: Min A +2 to power up into standing. Requiring cues for hand placement and positioning. Pt with left lean with initial stand from bed with bil UE support. Stood additional 2x from [facility] with cues for hand placement and RW present for stability with no lean with RW    Ambulation / Gait / Stairs / Wheelchair Mobility  Ambulation/Gait Ambulation/Gait assistance: Min assist, +2 safety/equipment Gait Distance (Feet): 3 Feet Assistive device: Rolling walker (2 wheeled) Gait Pattern/deviations: Shuffle, Narrow base of support General Gait Details: pt with narrow BOS, assist to direct and advance RW with pt only able to walk a few feet  limited by incontinence with assist for lines Gait velocity interpretation: <1.8 ft/sec, indicate of risk for recurrent falls    Posture / Balance Dynamic Sitting Balance Sitting balance - Comments: minguard EOB with intial left lean able to correct to midline, guarding at [facility] Balance Overall balance assessment: Needs assistance Sitting-balance support: No upper extremity supported, Feet supported Sitting balance-Leahy Scale: Poor Sitting balance - Comments: minguard EOB with intial left lean able to correct to midline, guarding at [facility] Standing balance support: Bilateral upper extremity supported, During functional activity Standing balance-Leahy Scale: Poor    Special needs/care consideration BiPAP/CPAP no CPM no Continuous Drip IV no Dialysis no        Days n/a Life Vest no Oxygen no Special Bed no Trach Size no Wound Vac (area) no  Location n/a Skin           Pressure Injury 03/08/19 Sacrum Medial Stage I -  Intact skin with non-blanchable redness of a localized area usually over a bony prominence.  Date First Assessed/Time First Assessed: 03/08/19 2115   Location: Sacrum  Location Orientation: Medial  Staging: Stage I -  Intact skin with non-blanchable redness of a localized area usually over a bony prominence.  Present on Admission: Yes  Dressing Type Foam - Lift dressing to assess site every shift  Dressing Clean;Dry;Intact  Dressing Change Frequency Every 3 days  Site / Wound Assessment Clean;Dry  Peri-wound Assessment Intact  Drainage Amount None  Pressure Injury 03/08/19 Sacrum Mid Deep Tissue Injury - Purple or maroon localized area of discolored intact skin or blood-filled blister due to damage of underlying soft tissue from pressure and/or shear.  Date First Assessed/Time First Assessed: 03/08/19 2151   Location: Sacrum  Location Orientation: Mid  Staging: Deep Tissue Injury - Purple or maroon localized area of discolored intact skin or blood-filled blister due to  damage of underlying soft tiss...  Dressing Type Foam - Lift dressing to assess site every shift  Dressing Clean;Dry;Intact  Site / Wound Assessment Clean;Dry  Peri-wound Assessment Intact  Margins Attached edges (approximated)  Drainage Amount None                     Bowel mgmt: incontinent, last BM prior to admission to the hospital on 10/16 Bladder mgmt: incontinent, purewick Diabetic mgmt: no Behavioral consideration no Chemo/radiation no   Previous Home Environment (from acute therapy documentation) Living Arrangements: Alone  Lives With: Alone Available Help at Discharge: Family([place]) Type of Home: House Home Layout: One level Home Access: Stairs to enter Entrance Stairs-Rails: Right Entrance Stairs-Number of Steps: 3 Bathroom Shower/Tub: Chiropodist: [place]: No Additional Comments: layout is for grandaughter's home (she plans for pt to come there)  Discharge Living Setting Plans for Discharge Living Setting: Lives with (comment)(grand daughter and her husband) Type of Home at Discharge: House Discharge Home Layout: Two level, Bed/bath upstairs Alternate Level Stairs-Rails: Right Alternate Level Stairs-Number of Steps: 15 Discharge Home Access: Stairs to enter Entrance Stairs-Rails: None(can install) Entrance Stairs-Number of Steps: 3 Discharge Bathroom Shower/Tub: Tub/shower unit Discharge Bathroom Toilet: Standard Discharge Bathroom Accessibility: Yes How Accessible: Accessible via walker Does the patient have any problems obtaining your medications?: No  Social/Family/Support Systems Anticipated Caregiver: grand daughter, Angie Fava  Anticipated Caregiver's Contact Information: [phone] Caregiver Availability: 24/7 Discharge Plan Discussed with Primary Caregiver: Yes Is Caregiver In Agreement with Plan?: Yes Does Caregiver/Family have Issues with Lodging/Transportation while Pt is in Rehab?:  No  Goals/Additional Needs Patient/Family Goal for Rehab: PT/OT/SLP supervision Expected length of stay: 10-12 days Dietary Needs: dys 3 thin Equipment Needs: tbd Additional Information: has baseline early dementia Pt/Family Agrees to Admission and willing to participate: Yes Program Orientation Provided & Reviewed with Pt/Caregiver Including Roles  & Responsibilities: Yes  Decrease burden of Care through IP rehab admission: n/a  Possible need for SNF placement upon discharge: not anticipated.   Patient Condition: I have reviewed medical records from [facility], spoken with CM, and patient and family member. I met with patient at the bedside for inpatient rehabilitation assessment.  Patient will benefit from ongoing PT, OT and SLP, can actively participate in 3 hours of therapy a day 5 days of the week, and can make measurable gains during the admission.  Patient will  also benefit from the coordinated team approach during an Inpatient Acute Rehabilitation admission.  The patient will receive intensive therapy as well as Rehabilitation physician, nursing, social worker, and care management interventions.  Due to bladder management, bowel management, safety, skin/wound care, medication administration, pain management and patient education the patient requires 24 hour a day rehabilitation nursing.  The patient is currently min assist with mobility and basic ADLs.  Discharge setting and therapy post discharge at home with home health is anticipated.  Patient has agreed to participate in the Acute Inpatient Rehabilitation Program and will admit today.  Preadmission Screen Completed By:  Michel Santee, PT, DPT 03/12/2019 11:55 AM ______________________________________________________________________   Discussed status with Dr. Posey Pronto on 03/12/19 at 12:01 PM  and received approval for admission today.  Admission Coordinator:  Michel Santee, PT, DPT time 12:01 PM Sudie Grumbling 03/12/19     Assessment/Plan: Diagnosis: L cerebellar ICH   1. Does the need for close, 24 hr/day Medical supervision in concert with the patient's rehab needs make it unreasonable for this patient to be served in a less intensive setting? Yes 2. Co-Morbidities requiring supervision/potential complications: HTN, labile blood pressure 3. Due to safety, disease management, medication administration and patient education, does the patient require 24 hr/day rehab nursing? Yes 4. Does the patient require coordinated care of a physician, rehab nurse, PT, OT, and SLP to address physical and functional deficits in the context of the above medical diagnosis(es)? Yes Addressing deficits in the following areas: balance, endurance, locomotion, strength, transferring, bathing, dressing, toileting, cognition and psychosocial support 5. Can the patient actively participate in an intensive therapy program of at least 3 hrs of therapy 5 days a week? Yes 6. The potential for patient to make measurable gains while on inpatient rehab is excellent 7. Anticipated functional outcomes upon discharge from inpatient rehab: supervision PT, supervision OT, supervision SLP 8. Estimated rehab length of stay to reach the above functional goals is: 10-14 days. 9. Anticipated discharge destination: Home 10. Overall Rehab/Functional Prognosis: good   MD Signature: Delice Lesch, MD, ABPMR

## 2019-03-12 NOTE — H&P (Signed)
Physical Medicine and Rehabilitation Admission H&P    Chief Complaint  Patient presents with  . ICH with functional decline.     HPI: Beth Foster is an 85 year old female with history of HTN,  prior stroke with decreased [place] RUE, glaucoma, macular degeneration with worsening of vision and recent reports of amaurosis fugax with reports of 2 day history of unsteady gait and right gaze preference.  History taken from chart review due to cognition.  She was admitted on 03/08/2019 after found down and CT head done revealing small cerebellar peduncle hemorrhage with mild surrounding edema.  She noted she was having dysuria and was started on antibiotics for Enterococcus faecalis CTA head/neck showed tortuosity and dilatation of supraclinoid ICA and no large vessel occlusion. Follow up CT head showed stable left medial cerebellar hemorrhage with extension into left ventricle.  No mass-effect noted.  MRI brain showed stable left cerebellar hematoma with penetration of 4th ventricle and dependent within occipital horns and lateral ventricles. Extensive chronic small vessel disease noted.  Echocardiogram with ejection fraction of 55-60%.  Mild increase in LVH and trivial pericardial effusion.  She has had reports of lower back pain and pelvic/sacral X rays negative but diffuse demineralization noted.  Hospital course complicated by lethargy, confusion, agitation.  Family reports confusion has progressively gotten worse and she received dose of Fosfomycin today. Therapy ongoing and patient with balance deficits with left lean as well as cognitive deficits affecting ADLs and mobility. CIR recommended due to functional decline.  Please see preadmission assessment from earlier today as well.   Review of Systems  Constitutional: Negative for chills and fever.  HENT: Negative for hearing loss and tinnitus.   Eyes: Positive for blurred vision (minimal vision on left and right  poor due to cataracts, glaucoma  and wet  macular degneration. ). Negative for double vision.  Respiratory: Negative for cough and sputum production.   Cardiovascular: Negative for chest pain, palpitations and leg swelling.  Gastrointestinal: Positive for heartburn (occasionally). Negative for abdominal pain and nausea.  Genitourinary: Positive for dysuria, frequency and urgency.       Incontinent of bladder --worse for the past year.   Musculoskeletal: Positive for back pain (since the fall). Negative for myalgias.  Skin: Negative for rash.  Neurological: Positive for dizziness (can pass out on standing.), weakness and headaches.  Psychiatric/Behavioral: Positive for hallucinations (visual hallucinations for past 3 months as vision loss has worsened) and memory loss.    Past Medical History:  Diagnosis Date  . Amaurosis fugax   . Anxiety   . Cataracts, bilateral   . Sherran Needs syndrome   . Cognitive decline   . Glaucoma   . Hypertension   . Macular degeneration of both eyes   . Stroke (cerebrum) ([place])    with decrease in [facility] of right hand.     Past Surgical History:  Procedure Laterality Date  . BREAST LUMPECTOMY  1995  . CARDIAC CATHETERIZATION  2004  . CHOLECYSTECTOMY  1972     Social History:  Lives alone. Has walker but uses cane instead. Has been unable to do housework or cook for past few months due to declining vision. Granddaughter assists prn. She has med-alert for safety. Used to work in a [employer] from [place] 8 years ago. She  reports that she has never smoked. She has never used smokeless tobacco. She reports that she does not drink alcohol or use drugs.    Allergies  Allergen  Reactions  . Hydrocortisone Other (See Comments)    Reaction to cream - causes blisters  . Prednisone Other (See Comments)    Causes heart to race  . Amoxicillin Itching and Rash    Did it involve swelling of the face/tongue/throat, SOB, or low BP? No Did it involve sudden or severe rash/hives, skin  peeling, or any reaction on the inside of your mouth or nose? Yes Did you need to seek medical attention at a hospital or doctor's office? No When did it last happen?85 years old If all above answers are "NO", may proceed with cephalosporin use.  . Clindamycin/Lincomycin Itching and Rash  . Keflex [Cephalexin] Itching and Rash  . Latex Rash  . Reglan [Metoclopramide] Itching and Rash    Medications Prior to Admission  Medication Sig Dispense Refill  . acetaminophen (TYLENOL) 500 MG tablet Take 500 mg by mouth daily as needed for headache (pain).    Marland Kitchen alendronate (FOSAMAX) 70 MG tablet Take 70 mg by mouth every Friday.    . cholecalciferol (VITAMIN D3) 25 MCG (1000 UT) tablet Take 2,000 Units by mouth every morning.    . diltiazem (DILT-XR) 240 MG 24 hr capsule Take 240 mg by mouth at bedtime.      Drug Regimen Review  Drug regimen was reviewed and remains appropriate with no significant issues identified  Home: Home Living Family/patient expects to be discharged to:: Private residence Living Arrangements: Alone Available Help at Discharge: Family([place]) Type of Home: House Home Access: Stairs to enter [employer] of Steps: 3 Entrance Stairs-Rails: Right Home Layout: One level Bathroom Shower/Tub: Chiropodist: Standard Home Equipment: Civil engineer, contracting, Grab bars - tub/shower, Hand held shower head Additional Comments: layout is for grandaughter's home (she plans for pt to come there)  Lives With: Alone   Functional History: Prior Function Level of Independence: Needs assistance Gait / Transfers Assistance Needed: walks with cane inside home ADL's / Homemaking Assistance Needed: Granddaughter performs all IADLs including driving, cooking, cleaning, and grocery shopping Comments: grandaughter provided/confirmed all information; they were at point of considering moving pt to her home and plan to do so after discharge  Functional Status:   Mobility: Bed Mobility Overal bed mobility: Needs Assistance Bed Mobility: Supine to Sit Rolling: Min guard Sidelying to sit: Mod assist, HOB elevated Supine to sit: Min assist, HOB elevated Sit to sidelying: Mod assist General bed mobility comments: minguard for safety with slight min assist once EOB for balance with left lean Transfers Overall transfer level: Needs assistance Equipment used: Rolling walker (2 wheeled) Transfers: Sit to/from Stand, Stand Pivot Transfers Sit to Stand: Min assist, +2 physical assistance, +2 safety/equipment Stand pivot transfers: Min assist, +2 safety/equipment General transfer comment: Min A +2 to power up into standing. Requiring cues for hand placement and positioning. Pt with left lean with initial stand from bed with bil UE support. Stood additional 2x from [facility] with cues for hand placement and RW present for stability with no lean with RW Ambulation/Gait Ambulation/Gait assistance: Min assist, +2 safety/equipment Gait Distance (Feet): 3 Feet Assistive device: Rolling walker (2 wheeled) Gait Pattern/deviations: Shuffle, Narrow base of support General Gait Details: pt with narrow BOS, assist to direct and advance RW with pt only able to walk a few feet limited by incontinence with assist for lines Gait velocity interpretation: <1.8 ft/sec, indicate of risk for recurrent falls    ADL: ADL Overall ADL's : Needs assistance/impaired Eating/Feeding: Set up, Sitting Grooming: Wash/dry hands, Set up, Sitting  Grooming Details (indicate cue type and reason): Pt washing her hands while seated with hand sanitizer. Upper Body Bathing: Minimal assistance, Sitting Lower Body Bathing: Maximal assistance, Sit to/from stand Upper Body Dressing : Minimal assistance, Sitting Upper Body Dressing Details (indicate cue type and reason): Min A for donning new gown Lower Body Dressing: Maximal assistance, Sit to/from stand Lower Body Dressing Details (indicate cue  type and reason): Max A to don socks. Pt attempting to bring ankles to knees but unable to perform successfully stating "this is much harder than when I sit on my toilet." Toilet Transfer: Minimal assistance, +2 for physical assistance, RW, BSC Toilet Transfer Details (indicate cue type and reason): Pt requiring Min A +2 to power up into standing and to maintain balance throughout.  Toileting- Clothing Manipulation and Hygiene: Maximal assistance, Minimal assistance, Sit to/from stand, Sitting/lateral lean Toileting - Clothing Manipulation Details (indicate cue type and reason): Pt performing peri care while seated on BSC with cues for lateral lean. Requiring Min A for safety and balance during lateral lean. Pt requiring Max A for peri care while in standing Functional mobility during ADLs: Minimal assistance, +2 for physical assistance, Rolling walker, Cueing for sequencing, Cueing for safety General ADL Comments: Pt with decreased strength, balance, safety, and cognition impacting his safe performance of ADLs.  Cognition: Cognition Overall Cognitive Status: No family/caregiver present to determine baseline cognitive functioning Arousal/Alertness: Awake/alert Orientation Level: Oriented to person, Oriented to place, Oriented to time, Oriented to situation Attention: Sustained Sustained Attention: Appears intact Memory: Impaired Memory Impairment: Decreased recall of new information, Storage deficit Awareness: Impaired Awareness Impairment: Anticipatory impairment Problem Solving: Impaired Problem Solving Impairment: Functional basic(Call bell ) Executive Function: Reasoning, Sequencing Reasoning: Impaired Reasoning Impairment: Verbal basic Initiating: Appears intact Safety/Judgment: Impaired Cognition Arousal/Alertness: Awake/alert Behavior During Therapy: WFL for tasks assessed/performed Overall Cognitive Status: No family/caregiver present to determine baseline cognitive  functioning Area of Impairment: Following commands, Safety/judgement, Problem solving, Attention Current Attention Level: Sustained Following Commands: Follows one step commands inconsistently, Follows one step commands with increased time Safety/Judgement: Decreased awareness of safety, Decreased awareness of deficits Problem Solving: Slow processing, Difficulty sequencing General Comments: pt with decreased awareness of incontinence and management, pt with slow processing for commands for transfers and safety   Blood pressure 93/61, pulse 87, temperature 97.9 F (36.6 C), temperature source Oral, resp. rate 15, height 5\' 5"  (1.651 m), weight 63.8 kg, SpO2 96 %. Physical Exam  Nursing note and vitals reviewed. Constitutional: She appears well-developed and well-nourished.  HENT:  Head: Normocephalic and atraumatic.  Wears full set dentures.   Eyes: EOM are normal. Right eye exhibits no discharge. Left eye exhibits no discharge.  Neck: No tracheal deviation present. No thyromegaly present.  Respiratory: Effort normal. No stridor. No respiratory distress.  GI: She exhibits no distension. There is abdominal tenderness.  Musculoskeletal:        General: No tenderness or edema.  Neurological:  Motor: Bilateral upper extremities: 4/5 proximal distal Bilateral lower extremities: Hip flexion: 5/5 (pain inhibition, knee extension 4/5, ankle dorsiflexion 4+/5 Alert and oriented, except for date of month Visual deficits  Skin: No erythema.  Ecchymosis left shin, healing  Psychiatric: Her affect is blunt. Her speech is delayed. She is slowed.    Results for orders placed or performed during the hospital encounter of 03/08/19 (from the past 48 hour(s))  CBC     Status: None   Collection Time: 03/11/19  7:10 AM  Result Value Ref Range   WBC  6.7 4.0 - 10.5 K/uL   RBC 4.09 3.87 - 5.11 MIL/uL   Hemoglobin 12.9 12.0 - 15.0 g/dL   HCT 40.1 36.0 - 46.0 %   MCV 98.0 80.0 - 100.0 fL   MCH 31.5  26.0 - 34.0 pg   MCHC 32.2 30.0 - 36.0 g/dL   RDW 13.2 11.5 - 15.5 %   Platelets 165 150 - 400 K/uL   nRBC 0.0 0.0 - 0.2 %    Comment: Performed at [facility], [place] [address] [place], [place] [id number]  Basic metabolic panel     Status: Abnormal   Collection Time: 03/11/19  7:10 AM  Result Value Ref Range   Sodium 141 135 - 145 mmol/L   Potassium 3.2 (L) 3.5 - 5.1 mmol/L   Chloride 110 98 - 111 mmol/L   CO2 22 22 - 32 mmol/L   Glucose, Bld 117 (H) 70 - 99 mg/dL   BUN 7 (L) 8 - 23 mg/dL   Creatinine, Ser 0.54 0.44 - 1.00 mg/dL   Calcium 8.8 (L) 8.9 - 10.3 mg/dL   GFR calc non Af Amer >60 >60 mL/min   GFR calc Af Amer >60 >60 mL/min   Anion gap 9 5 - 15    Comment: Performed at [facility], [place] [address], [place], [place] [address]  CBC     Status: None   Collection Time: 03/12/19  6:15 AM  Result Value Ref Range   WBC 5.9 4.0 - 10.5 K/uL   RBC 4.31 3.87 - 5.11 MIL/uL   Hemoglobin 13.7 12.0 - 15.0 g/dL   HCT 42.8 36.0 - 46.0 %   MCV 99.3 80.0 - 100.0 fL   MCH 31.8 26.0 - 34.0 pg   MCHC 32.0 30.0 - 36.0 g/dL   RDW 13.1 11.5 - 15.5 %   Platelets 170 150 - 400 K/uL   nRBC 0.0 0.0 - 0.2 %    Comment: Performed at [facility], [place] [address] [place], [place] [id number]  Basic metabolic panel     Status: Abnormal   Collection Time: 03/12/19  6:15 AM  Result Value Ref Range   Sodium 139 135 - 145 mmol/L   Potassium 3.7 3.5 - 5.1 mmol/L   Chloride 107 98 - 111 mmol/L   CO2 21 (L) 22 - 32 mmol/L   Glucose, Bld 110 (H) 70 - 99 mg/dL   BUN 6 (L) 8 - 23 mg/dL   Creatinine, Ser 0.61 0.44 - 1.00 mg/dL   Calcium 9.0 8.9 - 10.3 mg/dL   GFR calc non Af Amer >60 >60 mL/min   GFR calc Af Amer >60 >60 mL/min   Anion gap 11 5 - 15    Comment: Performed at [facility], [place] [address] [place], [place] [address]   No results found.     Medical Problem List and Plan: 1. Balance deficits with left lean as well as cognitive deficits affecting ADLs and  mobility secondary to  left cerebellar hematoma with penetration of 4th ventricle and dependent within occipital horns and lateral ventricles  Admit to CIR 2.  Antithrombotics: -DVT/anticoagulation:  Pharmaceutical: Lovenox  -antiplatelet therapy: N/A 3. HA/back pain/Pain Management: Will continue tylenol prn  4. Mood: LCSW to follow for evaluation and support.   -antipsychotic agents: N/A 5. Neuropsych: This patient is not fully capable of making decisions on her own behalf. 6. Skin/Wound Care: Routine pressure relief measures.  Maintain adequate nutrition and  hydration status. 7. Fluids/Electrolytes/Nutrition: Monitor intake and output.  Offer nutritional supplements as needed Po intake.  CMP ordered for tomorrow 8.  HTN: Monitor orthostatic blood pressures.  Patient reports episodic syncope with standing.  Continue Cardizem daily   Monitor with increased mobility 9.  Macular degeneration: Visual hallucinations have been worsening.  Exacerbated by bilateral cataracts as well as elevated pressures left eye.  Will start Xalatan (new RX per family)  10.  UTI: Mild abdominal discomfort on exam--question urinary retention.    Check PVRs 11. GERD: Continue Protonix.  Bary Leriche, PA-C 03/12/2019  I have personally performed a face to face diagnostic evaluation, including, but not limited to relevant history and physical exam findings, of this patient and developed relevant assessment and plan.  Additionally, I have reviewed and concur with the physician assistant's documentation above.  Delice Lesch, MD, ABPMR The patient's status has not changed. The original post admission physician evaluation remains appropriate, and any changes from the pre-admission screening or documentation from the acute chart are noted above.   Delice Lesch, MD, ABPMR

## 2019-03-12 NOTE — H&P (Addendum)
Physical Medicine and Rehabilitation Admission H&P    Chief Complaint  Patient presents with  . ICH with functional decline.     HPI: Beth Foster is an 85 year old female with history of HTN,  prior stroke with decreased [place] RUE, glaucoma, macular degeneration with worsening of vision and recent reports of amaurosis fugax with reports of 2 day history of unsteady gait and right gaze preference.  History taken from chart review due to cognition.  She was admitted on 03/08/2019 after found down and CT head done revealing small cerebellar peduncle hemorrhage with mild surrounding edema.  She noted she was having dysuria and was started on antibiotics for Enterococcus faecalis CTA head/neck showed tortuosity and dilatation of supraclinoid ICA and no large vessel occlusion. Follow up CT head showed stable left medial cerebellar hemorrhage with extension into left ventricle.  No mass-effect noted.  MRI brain showed stable left cerebellar hematoma with penetration of 4th ventricle and dependent within occipital horns and lateral ventricles. Extensive chronic small vessel disease noted.  Echocardiogram with ejection fraction of 55-60%.  Mild increase in LVH and trivial pericardial effusion.  She has had reports of lower back pain and pelvic/sacral X rays negative but diffuse demineralization noted.  Hospital course complicated by lethargy, confusion, agitation.  Family reports confusion has progressively gotten worse and she received dose of Fosfomycin today. Therapy ongoing and patient with balance deficits with left lean as well as cognitive deficits affecting ADLs and mobility. CIR recommended due to functional decline.  Please see preadmission assessment from earlier today as well.   Review of Systems  Constitutional: Negative for chills and fever.  HENT: Negative for hearing loss and tinnitus.   Eyes: Positive for blurred vision (minimal vision on left and right  poor due to cataracts, glaucoma  and wet  macular degneration. ). Negative for double vision.  Respiratory: Negative for cough and sputum production.   Cardiovascular: Negative for chest pain, palpitations and leg swelling.  Gastrointestinal: Positive for heartburn (occasionally). Negative for abdominal pain and nausea.  Genitourinary: Positive for dysuria, frequency and urgency.       Incontinent of bladder --worse for the past year.   Musculoskeletal: Positive for back pain (since the fall). Negative for myalgias.  Skin: Negative for rash.  Neurological: Positive for dizziness (can pass out on standing.), weakness and headaches.  Psychiatric/Behavioral: Positive for hallucinations (visual hallucinations for past 3 months as vision loss has worsened) and memory loss.    Past Medical History:  Diagnosis Date  . Amaurosis fugax   . Anxiety   . Cataracts, bilateral   . Sherran Needs syndrome   . Cognitive decline   . Glaucoma   . Hypertension   . Macular degeneration of both eyes   . Stroke (cerebrum) ([place])    with decrease in [facility] of right hand.     Past Surgical History:  Procedure Laterality Date  . BREAST LUMPECTOMY  1995  . CARDIAC CATHETERIZATION  2004  . CHOLECYSTECTOMY  1972     Social History:  Lives alone. Has walker but uses cane instead. Has been unable to do housework or cook for past few months due to declining vision. Granddaughter assists prn. She has med-alert for safety. Used to work in a [employer] from [place] 8 years ago. She  reports that she has never smoked. She has never used smokeless tobacco. She reports that she does not drink alcohol or use drugs.    Allergies  Allergen  Reactions  . Hydrocortisone Other (See Comments)    Reaction to cream - causes blisters  . Prednisone Other (See Comments)    Causes heart to race  . Amoxicillin Itching and Rash    Did it involve swelling of the face/tongue/throat, SOB, or low BP? No Did it involve sudden or severe rash/hives, skin  peeling, or any reaction on the inside of your mouth or nose? Yes Did you need to seek medical attention at a hospital or doctor's office? No When did it last happen?85 years old If all above answers are "NO", may proceed with cephalosporin use.  . Clindamycin/Lincomycin Itching and Rash  . Keflex [Cephalexin] Itching and Rash  . Latex Rash  . Reglan [Metoclopramide] Itching and Rash    Medications Prior to Admission  Medication Sig Dispense Refill  . acetaminophen (TYLENOL) 500 MG tablet Take 500 mg by mouth daily as needed for headache (pain).    Marland Kitchen alendronate (FOSAMAX) 70 MG tablet Take 70 mg by mouth every Friday.    . cholecalciferol (VITAMIN D3) 25 MCG (1000 UT) tablet Take 2,000 Units by mouth every morning.    . diltiazem (DILT-XR) 240 MG 24 hr capsule Take 240 mg by mouth at bedtime.      Drug Regimen Review  Drug regimen was reviewed and remains appropriate with no significant issues identified  Home: Home Living Family/patient expects to be discharged to:: Private residence Living Arrangements: Alone Available Help at Discharge: Family([place]) Type of Home: House Home Access: Stairs to enter [employer] of Steps: 3 Entrance Stairs-Rails: Right Home Layout: One level Bathroom Shower/Tub: Chiropodist: Standard Home Equipment: Civil engineer, contracting, Grab bars - tub/shower, Hand held shower head Additional Comments: layout is for grandaughter's home (she plans for pt to come there)  Lives With: Alone   Functional History: Prior Function Level of Independence: Needs assistance Gait / Transfers Assistance Needed: walks with cane inside home ADL's / Homemaking Assistance Needed: Granddaughter performs all IADLs including driving, cooking, cleaning, and grocery shopping Comments: grandaughter provided/confirmed all information; they were at point of considering moving pt to her home and plan to do so after discharge  Functional Status:   Mobility: Bed Mobility Overal bed mobility: Needs Assistance Bed Mobility: Supine to Sit Rolling: Min guard Sidelying to sit: Mod assist, HOB elevated Supine to sit: Min assist, HOB elevated Sit to sidelying: Mod assist General bed mobility comments: minguard for safety with slight min assist once EOB for balance with left lean Transfers Overall transfer level: Needs assistance Equipment used: Rolling walker (2 wheeled) Transfers: Sit to/from Stand, Stand Pivot Transfers Sit to Stand: Min assist, +2 physical assistance, +2 safety/equipment Stand pivot transfers: Min assist, +2 safety/equipment General transfer comment: Min A +2 to power up into standing. Requiring cues for hand placement and positioning. Pt with left lean with initial stand from bed with bil UE support. Stood additional 2x from [facility] with cues for hand placement and RW present for stability with no lean with RW Ambulation/Gait Ambulation/Gait assistance: Min assist, +2 safety/equipment Gait Distance (Feet): 3 Feet Assistive device: Rolling walker (2 wheeled) Gait Pattern/deviations: Shuffle, Narrow base of support General Gait Details: pt with narrow BOS, assist to direct and advance RW with pt only able to walk a few feet limited by incontinence with assist for lines Gait velocity interpretation: <1.8 ft/sec, indicate of risk for recurrent falls    ADL: ADL Overall ADL's : Needs assistance/impaired Eating/Feeding: Set up, Sitting Grooming: Wash/dry hands, Set up, Sitting  Grooming Details (indicate cue type and reason): Pt washing her hands while seated with hand sanitizer. Upper Body Bathing: Minimal assistance, Sitting Lower Body Bathing: Maximal assistance, Sit to/from stand Upper Body Dressing : Minimal assistance, Sitting Upper Body Dressing Details (indicate cue type and reason): Min A for donning new gown Lower Body Dressing: Maximal assistance, Sit to/from stand Lower Body Dressing Details (indicate cue  type and reason): Max A to don socks. Pt attempting to bring ankles to knees but unable to perform successfully stating "this is much harder than when I sit on my toilet." Toilet Transfer: Minimal assistance, +2 for physical assistance, RW, BSC Toilet Transfer Details (indicate cue type and reason): Pt requiring Min A +2 to power up into standing and to maintain balance throughout.  Toileting- Clothing Manipulation and Hygiene: Maximal assistance, Minimal assistance, Sit to/from stand, Sitting/lateral lean Toileting - Clothing Manipulation Details (indicate cue type and reason): Pt performing peri care while seated on BSC with cues for lateral lean. Requiring Min A for safety and balance during lateral lean. Pt requiring Max A for peri care while in standing Functional mobility during ADLs: Minimal assistance, +2 for physical assistance, Rolling walker, Cueing for sequencing, Cueing for safety General ADL Comments: Pt with decreased strength, balance, safety, and cognition impacting his safe performance of ADLs.  Cognition: Cognition Overall Cognitive Status: No family/caregiver present to determine baseline cognitive functioning Arousal/Alertness: Awake/alert Orientation Level: Oriented to person, Oriented to place, Oriented to time, Oriented to situation Attention: Sustained Sustained Attention: Appears intact Memory: Impaired Memory Impairment: Decreased recall of new information, Storage deficit Awareness: Impaired Awareness Impairment: Anticipatory impairment Problem Solving: Impaired Problem Solving Impairment: Functional basic(Call bell ) Executive Function: Reasoning, Sequencing Reasoning: Impaired Reasoning Impairment: Verbal basic Initiating: Appears intact Safety/Judgment: Impaired Cognition Arousal/Alertness: Awake/alert Behavior During Therapy: WFL for tasks assessed/performed Overall Cognitive Status: No family/caregiver present to determine baseline cognitive  functioning Area of Impairment: Following commands, Safety/judgement, Problem solving, Attention Current Attention Level: Sustained Following Commands: Follows one step commands inconsistently, Follows one step commands with increased time Safety/Judgement: Decreased awareness of safety, Decreased awareness of deficits Problem Solving: Slow processing, Difficulty sequencing General Comments: pt with decreased awareness of incontinence and management, pt with slow processing for commands for transfers and safety   Blood pressure 93/61, pulse 87, temperature 97.9 F (36.6 C), temperature source Oral, resp. rate 15, height 5\' 5"  (1.651 m), weight 63.8 kg, SpO2 96 %. Physical Exam  Nursing note and vitals reviewed. Constitutional: She appears well-developed and well-nourished.  HENT:  Head: Normocephalic and atraumatic.  Wears full set dentures.   Eyes: EOM are normal. Right eye exhibits no discharge. Left eye exhibits no discharge.  Neck: No tracheal deviation present. No thyromegaly present.  Respiratory: Effort normal. No stridor. No respiratory distress.  GI: She exhibits no distension. There is abdominal tenderness.  Musculoskeletal:        General: No tenderness or edema.  Neurological:  Motor: Bilateral upper extremities: 4/5 proximal distal Bilateral lower extremities: Hip flexion: 5/5 (pain inhibition, knee extension 4/5, ankle dorsiflexion 4+/5 Alert and oriented, except for date of month Visual deficits  Skin: No erythema.  Ecchymosis left shin, healing  Psychiatric: Her affect is blunt. Her speech is delayed. She is slowed.    Results for orders placed or performed during the hospital encounter of 03/08/19 (from the past 48 hour(s))  CBC     Status: None   Collection Time: 03/11/19  7:10 AM  Result Value Ref Range   WBC  6.7 4.0 - 10.5 K/uL   RBC 4.09 3.87 - 5.11 MIL/uL   Hemoglobin 12.9 12.0 - 15.0 g/dL   HCT 40.1 36.0 - 46.0 %   MCV 98.0 80.0 - 100.0 fL   MCH 31.5  26.0 - 34.0 pg   MCHC 32.2 30.0 - 36.0 g/dL   RDW 13.2 11.5 - 15.5 %   Platelets 165 150 - 400 K/uL   nRBC 0.0 0.0 - 0.2 %    Comment: Performed at [facility], [place] [address] [place], [place] [id number]  Basic metabolic panel     Status: Abnormal   Collection Time: 03/11/19  7:10 AM  Result Value Ref Range   Sodium 141 135 - 145 mmol/L   Potassium 3.2 (L) 3.5 - 5.1 mmol/L   Chloride 110 98 - 111 mmol/L   CO2 22 22 - 32 mmol/L   Glucose, Bld 117 (H) 70 - 99 mg/dL   BUN 7 (L) 8 - 23 mg/dL   Creatinine, Ser 0.54 0.44 - 1.00 mg/dL   Calcium 8.8 (L) 8.9 - 10.3 mg/dL   GFR calc non Af Amer >60 >60 mL/min   GFR calc Af Amer >60 >60 mL/min   Anion gap 9 5 - 15    Comment: Performed at [facility], [place] [address], [place], [place] [address]  CBC     Status: None   Collection Time: 03/12/19  6:15 AM  Result Value Ref Range   WBC 5.9 4.0 - 10.5 K/uL   RBC 4.31 3.87 - 5.11 MIL/uL   Hemoglobin 13.7 12.0 - 15.0 g/dL   HCT 42.8 36.0 - 46.0 %   MCV 99.3 80.0 - 100.0 fL   MCH 31.8 26.0 - 34.0 pg   MCHC 32.0 30.0 - 36.0 g/dL   RDW 13.1 11.5 - 15.5 %   Platelets 170 150 - 400 K/uL   nRBC 0.0 0.0 - 0.2 %    Comment: Performed at [facility], [place] [address] [place], [place] [id number]  Basic metabolic panel     Status: Abnormal   Collection Time: 03/12/19  6:15 AM  Result Value Ref Range   Sodium 139 135 - 145 mmol/L   Potassium 3.7 3.5 - 5.1 mmol/L   Chloride 107 98 - 111 mmol/L   CO2 21 (L) 22 - 32 mmol/L   Glucose, Bld 110 (H) 70 - 99 mg/dL   BUN 6 (L) 8 - 23 mg/dL   Creatinine, Ser 0.61 0.44 - 1.00 mg/dL   Calcium 9.0 8.9 - 10.3 mg/dL   GFR calc non Af Amer >60 >60 mL/min   GFR calc Af Amer >60 >60 mL/min   Anion gap 11 5 - 15    Comment: Performed at [facility], [place] [address], [place], [place] [address]   No results found.     Medical Problem List and Plan: 1. Balance deficits with left lean as well as cognitive deficits affecting ADLs and  mobility secondary to  left cerebellar hematoma with penetration of 4th ventricle and dependent within occipital horns and lateral ventricles  Admit to CIR 2.  Antithrombotics: -DVT/anticoagulation:  Pharmaceutical: Lovenox  -antiplatelet therapy: N/A 3. HA/back pain/Pain Management: Will continue tylenol prn  4. Mood: LCSW to follow for evaluation and support.   -antipsychotic agents: N/A 5. Neuropsych: This patient is not fully capable of making decisions on her own behalf. 6. Skin/Wound Care: Routine pressure relief measures.  Maintain adequate nutrition and  hydration status. 7. Fluids/Electrolytes/Nutrition: Monitor intake and output.  Offer nutritional supplements as needed Po intake.  CMP ordered for tomorrow 8.  HTN: Monitor orthostatic blood pressures.  Patient reports episodic syncope with standing.  Continue Cardizem daily   Monitor with increased mobility 9.  Macular degeneration: Visual hallucinations have been worsening.  Exacerbated by bilateral cataracts as well as elevated pressures left eye.  Will start Xalatan (new RX per family)  10.  UTI: Mild abdominal discomfort on exam--question urinary retention.    Check PVRs 11. GERD: Continue Protonix.  Bary Leriche, PA-C 03/12/2019  I have personally performed a face to face diagnostic evaluation, including, but not limited to relevant history and physical exam findings, of this patient and developed relevant assessment and plan.  Additionally, I have reviewed and concur with the physician assistant's documentation above.  Delice Lesch, MD, ABPMR

## 2019-03-13 ENCOUNTER — Inpatient Hospital Stay (HOSPITAL_COMMUNITY): Payer: Medicare Other | Admitting: Occupational Therapy

## 2019-03-13 ENCOUNTER — Inpatient Hospital Stay (HOSPITAL_COMMUNITY): Payer: Medicare Other

## 2019-03-13 DIAGNOSIS — K5901 Slow transit constipation: Secondary | ICD-10-CM

## 2019-03-13 DIAGNOSIS — H3530 Unspecified macular degeneration: Secondary | ICD-10-CM

## 2019-03-13 DIAGNOSIS — E46 Unspecified protein-calorie malnutrition: Secondary | ICD-10-CM | POA: Diagnosis present

## 2019-03-13 DIAGNOSIS — I10 Essential (primary) hypertension: Secondary | ICD-10-CM | POA: Diagnosis present

## 2019-03-13 DIAGNOSIS — E8809 Other disorders of plasma-protein metabolism, not elsewhere classified: Secondary | ICD-10-CM

## 2019-03-13 DIAGNOSIS — R339 Retention of urine, unspecified: Secondary | ICD-10-CM | POA: Diagnosis present

## 2019-03-13 DIAGNOSIS — I614 Nontraumatic intracerebral hemorrhage in cerebellum: Secondary | ICD-10-CM

## 2019-03-13 LAB — COMPREHENSIVE METABOLIC PANEL
ALT: 16 U/L (ref 0–44)
AST: 20 U/L (ref 15–41)
Albumin: 3.2 g/dL — ABNORMAL LOW (ref 3.5–5.0)
Alkaline Phosphatase: 49 U/L (ref 38–126)
Anion gap: 10 (ref 5–15)
BUN: 10 mg/dL (ref 8–23)
CO2: 21 mmol/L — ABNORMAL LOW (ref 22–32)
Calcium: 9.5 mg/dL (ref 8.9–10.3)
Chloride: 108 mmol/L (ref 98–111)
Creatinine, Ser: 0.62 mg/dL (ref 0.44–1.00)
GFR calc Af Amer: >60 mL/min (ref >60)
GFR calc non Af Amer: >60 mL/min (ref >60)
Glucose, Bld: 109 mg/dL — ABNORMAL HIGH (ref 70–99)
Potassium: 3.9 mmol/L (ref 3.5–5.1)
Sodium: 139 mmol/L (ref 135–145)
Total Bilirubin: 0.6 mg/dL (ref 0.3–1.2)
Total Protein: 6.2 g/dL — ABNORMAL LOW (ref 6.5–8.1)

## 2019-03-13 LAB — CBC WITH DIFFERENTIAL/PLATELET
Abs Immature Granulocytes: 0.02 10*3/uL (ref 0.00–0.07)
Basophils Absolute: 0 10*3/uL (ref 0.0–0.1)
Basophils Relative: 1 %
Eosinophils Absolute: 0.2 10*3/uL (ref 0.0–0.5)
Eosinophils Relative: 4 %
HCT: 41.4 % (ref 36.0–46.0)
Hemoglobin: 13.6 g/dL (ref 12.0–15.0)
Immature Granulocytes: 0 %
Lymphocytes Relative: 36 %
Lymphs Abs: 1.7 10*3/uL (ref 0.7–4.0)
MCH: 32 pg (ref 26.0–34.0)
MCHC: 32.9 g/dL (ref 30.0–36.0)
MCV: 97.4 fL (ref 80.0–100.0)
Monocytes Absolute: 0.5 10*3/uL (ref 0.1–1.0)
Monocytes Relative: 11 %
Neutro Abs: 2.3 10*3/uL (ref 1.7–7.7)
Neutrophils Relative %: 48 %
Platelets: 181 10*3/uL (ref 150–400)
RBC: 4.25 MIL/uL (ref 3.87–5.11)
RDW: 13.2 % (ref 11.5–15.5)
WBC: 4.8 10*3/uL (ref 4.0–10.5)
nRBC: 0 % (ref 0.0–0.2)

## 2019-03-13 MED ORDER — ADULT MULTIVITAMIN W/MINERALS CH
1.0000 | ORAL_TABLET | Freq: Every day | ORAL | Status: DC
  Administered 2019-03-14 – 2019-04-04 (×22): 1 via ORAL
  Filled 2019-03-13 (×22): qty 1

## 2019-03-13 MED ORDER — SENNOSIDES-DOCUSATE SODIUM 8.6-50 MG PO TABS
2.0000 | ORAL_TABLET | Freq: Every day | ORAL | Status: DC
  Administered 2019-03-13 – 2019-03-26 (×12): 2 via ORAL
  Filled 2019-03-13 (×13): qty 2

## 2019-03-13 MED ORDER — LIDOCAINE HCL URETHRAL/MUCOSAL 2 % EX GEL
Freq: Four times a day (QID) | CUTANEOUS | Status: AC
  Administered 2019-03-13: 5 via TOPICAL
  Administered 2019-03-13: 16:00:00 via TOPICAL
  Administered 2019-03-14 (×2): 5 via TOPICAL
  Administered 2019-03-14 – 2019-03-15 (×2): via TOPICAL
  Filled 2019-03-13 (×4): qty 5

## 2019-03-13 MED ORDER — PRO-STAT SUGAR FREE PO LIQD
30.0000 mL | Freq: Two times a day (BID) | ORAL | Status: DC
  Administered 2019-03-13 – 2019-04-04 (×40): 30 mL via ORAL
  Filled 2019-03-13 (×45): qty 30

## 2019-03-13 MED ORDER — ENSURE ENLIVE PO LIQD
237.0000 mL | Freq: Two times a day (BID) | ORAL | Status: DC
  Administered 2019-03-13 – 2019-03-18 (×9): 237 mL via ORAL

## 2019-03-13 MED ORDER — WITCH HAZEL-GLYCERIN EX PADS
MEDICATED_PAD | CUTANEOUS | Status: DC | PRN
  Filled 2019-03-13: qty 100

## 2019-03-13 MED ORDER — POLYETHYLENE GLYCOL 3350 17 G PO PACK
17.0000 g | PACK | Freq: Two times a day (BID) | ORAL | Status: DC
  Administered 2019-03-13 – 2019-03-27 (×23): 17 g via ORAL
  Filled 2019-03-13 (×28): qty 1

## 2019-03-13 MED ORDER — MAGNESIUM HYDROXIDE 400 MG/5ML PO SUSP
30.0000 mL | Freq: Once | ORAL | Status: AC
  Administered 2019-03-13: 30 mL via ORAL
  Filled 2019-03-13: qty 30

## 2019-03-13 NOTE — Progress Notes (Signed)
Fleets enema given to pt. Pt attempted to push stool out but unable to expel requiring manual disimpaction. Moderate amount of stool removed. Pt complained of pain in rectum and noted some bleeding from hemorrhoids. Tylenol given for comfort.  pam PA made aware. New orders obtained. Continue plan of care. Pt currently resting in bed. Gerald Stabs, RN

## 2019-03-13 NOTE — Progress Notes (Signed)
Social Work Assessment and Plan   Patient Details  Name: Beth Foster MRN: [id number] Date of Birth: 08-21-1928  Today's Date: 03/13/2019  Problem List:  Patient Active Problem List   Diagnosis Date Noted  . Hypoalbuminemia due to protein-calorie malnutrition ([place])   . Urinary retention   . Benign essential HTN   . Essential hypertension 03/12/2019  . Hyperlipidemia 03/12/2019  . UTI (urinary tract infection) 03/12/2019  . Enterococcus UTI   . Macular degeneration   . Cognitive deficit, post-stroke   . Pressure injury of skin 03/09/2019  . ICH (intracerebral hemorrhage) (HCC) w/ IVH, L cerebellar 03/08/2019  . Vascular dementia without behavioral disturbance ([place]) 02/28/2019  . Cerebrovascular disease 02/28/2019  . Visual field cut 02/28/2019  . Slow transit constipation 02/28/2019  . Rectal bleeding 02/28/2019  . Venous insufficiency 07/15/2016  . Visual hallucinations 07/15/2016  . OAB (overactive bladder) 07/15/2016  . Cellulitis 07/09/2016  . Cellulitis of right lower extremity 07/09/2016  . Neck pain 01/15/2016  . Left cervical lymphadenopathy 01/08/2016  . Cough 06/09/2015  . Dyspnea 06/09/2015  . Acute bronchitis 06/09/2015  . Allergic rhinitis 04/15/2015  . Syncope and collapse 01/10/2015  . Contusion of left supra orbit 01/10/2015  . Laceration of right eyebrow 01/10/2015  . DOE (dyspnea on exertion) 01/10/2015  . Exertional chest pain 01/10/2015  . Fatigue 01/10/2015  . Acute hyperglycemia 01/10/2015  . Hemorrhoids 10/28/2013  . Vitamin D deficiency 10/28/2013  . Painful lumpy right breast 12/06/2012  . HLD (hyperlipidemia) 12/06/2012  . Essential hypertension, benign 12/06/2012  . Malignant neoplasm of breast (female), unspecified site   . Mild cognitive impairment with memory loss 08/20/2012  . Gait disorder 08/20/2012  . Wound, open, head 08/20/2012  . GERD (gastroesophageal reflux disease) 08/20/2012   Past Medical History:  Past Medical History:   Diagnosis Date  . Amaurosis fugax   . Anxiety   . Anxiety state, unspecified   . Bunion   . Cataracts, bilateral   . Sherran Needs syndrome   . Cognitive decline   . Contact dermatitis and other eczema due to other chemical products   . Edema   . External hemorrhoids without mention of complication   . GERD (gastroesophageal reflux disease)   . Glaucoma   . Hypertension   . Macular degeneration of both eyes   . Malignant neoplasm of breast (female), unspecified site dx'd 1996   rt breast; xrt   . Pain in joint, pelvic region and thigh   . Rash and other nonspecific skin eruption   . Stroke (cerebrum) ([place])    with decrease in [facility] of right hand.   . Stroke ([place])   . Unspecified cataract   . Unspecified late effects of cerebrovascular disease   . Viral hepatitis A without mention of hepatic coma   . Viral hepatitis A without mention of hepatic coma   . Vitamin D deficiency    Past Surgical History:  Past Surgical History:  Procedure Laterality Date  . BREAST LUMPECTOMY  1995  . CARDIAC CATHETERIZATION  2004  . CHOLECYSTECTOMY  1972   Social History:  reports that she has never smoked. She has never used smokeless tobacco. She reports that she does not drink alcohol or use drugs.  Family / Support Systems Marital Status: Widow/Widower Patient Roles: Parent, Other (Comment)(Granddaughter) Other Supports: Ana pantazahias-granddaughter [phone] Anticipated Caregiver: Granddaughter, her husband will be assisting with her care Ability/Limitations of Caregiver: Wilhemena Durie does work and husband work but can arrange 24 hr  care if needed. Caregiver Availability: 24/7 Family Dynamics: Close knit with granddaughter who has been helping her for years, but not having to physcially assist. She has friends and neighbors who are helpful. Her five children are not involved with her.  Social History Preferred language: English Religion: None Cultural Background: No issues Education:  HS Read: Yes Write: Yes Employment Status: Retired Public relations account executive Issues: No issues Guardian/Conservator: None-according to MD pt is not fully capable of making decisions while here, will look toward her granddaughter who is very involved.   Abuse/Neglect Abuse/Neglect Assessment Can Be Completed: Yes Physical Abuse: Denies Verbal Abuse: Denies Sexual Abuse: Denies Exploitation of patient/patient's resources: Denies Self-Neglect: Denies  Emotional Status Pt's affect, behavior and adjustment status: Pt is motivated to do well she has always been independent and was still trying after diagnosis of macular degeneration 6 months ago. She was still living alone and managing, her granddaughter would assist with transportation. Recent Psychosocial Issues: other health issues-diagnosis 6 months ago with macular degeneration Psychiatric History: No history deferred depression screen due to exhausted from therapies and still adjusting to the new unit. Do feel may benefit from seeing neuro-psych while here for coping Substance Abuse History: No issues  Patient / Family Perceptions, Expectations & Goals Pt/Family understanding of illness & functional limitations: Granddaughter can explain her stroke and deficits. Sheis hopeful pt will do well while here and make good progress. She will talk with MD and hopes with therapy her grandmother will do well here. Premorbid pt/family roles/activities: Mom, grandmother, retiree, neighbor, friend, etc Anticipated changes in roles/activities/participation: resume Pt/family expectations/goals: Pt states: " I want to do well here, but I am dizzy I hope this gets better."  Granddaughter states: " I am hopeful she will do well she has always had a strong work ethic and will do what she can to get back to her independent level."  [employer]: None Premorbid Home Care/DME Agencies: None Transportation available at discharge:  granddaughter and her husband Resource referrals recommended: Neuropsychology, Support group (specify)  Discharge Planning Living Arrangements: Alone Support Systems: Other relatives, Friends/neighbors Type of Residence: Private residence Insurance underwriter Resources: Multimedia programmer (specify)(UHC-Medicare) Financial Resources: Radio broadcast assistant Screen Referred: No Living Expenses: Own Money Management: Patient Does the patient have any problems obtaining your medications?: No Home Management: Self -granddaughter has been helping the past 6 months Patient/Family Preliminary Plans: Either return to her home or to granddaughter's home depending upon how much care she requires at discharge from here. informed Ana she will need at least 24 hr supervision at [place] it is rare someone leaves at an independent level. Social Work Anticipated Follow Up Needs: HH/OP, Support Group  Clinical Impression Pleasant female who is tired from therapies and fighting UTI and getting used to her visual issues. She has always been independent and taken care of herself, but now will need assist. Her granddaughter is very involved and has been assisting her over the years. She does have five children all of which are not involved according to [facility]. Will await therapy goals and work on best plan for pt. Probably would benefit from seeing neuro-psych while here.  Elease Hashimoto 03/13/2019, 2:51 PM

## 2019-03-13 NOTE — Plan of Care (Signed)
  Problem: Consults Goal: RH STROKE PATIENT EDUCATION Description: See Patient Education module for education specifics  Outcome: Progressing   Problem: RH BOWEL ELIMINATION Goal: RH STG MANAGE BOWEL WITH ASSISTANCE Description: STG Manage Bowel with min Assistance. Outcome: Progressing Goal: RH STG MANAGE BOWEL W/MEDICATION W/ASSISTANCE Description: STG Manage Bowel with Medication with min Assistance. Outcome: Progressing   Problem: RH BLADDER ELIMINATION Goal: RH STG MANAGE BLADDER WITH ASSISTANCE Description: STG Manage Bladder With min Assistance Outcome: Progressing Goal: RH STG MANAGE BLADDER WITH MEDICATION WITH ASSISTANCE Description: STG Manage Bladder With Medication With min Assistance. Outcome: Progressing   Problem: RH SKIN INTEGRITY Goal: RH STG MAINTAIN SKIN INTEGRITY WITH ASSISTANCE Description: STG Maintain Skin Integrity With mod I Assistance. Outcome: Progressing   Problem: RH SAFETY Goal: RH STG DECREASED RISK OF FALL WITH ASSISTANCE Description: STG Decreased Risk of Fall With cues and reminder Assistance. Outcome: Progressing   Problem: RH COGNITION-NURSING Goal: RH STG ANTICIPATES NEEDS/CALLS FOR ASSIST W/ASSIST/CUES Description: STG Anticipates Needs/Calls for Assist With cues and reminders Assistance/Cues. Outcome: Progressing   Problem: RH PAIN MANAGEMENT Goal: RH STG PAIN MANAGED AT OR BELOW PT'S PAIN GOAL Description: Pain level less than 3 on scale of 0-10 Outcome: Progressing   Problem: RH KNOWLEDGE DEFICIT Goal: RH STG INCREASE KNOWLEDGE OF HYPERTENSION Description: Pt will be able to adhere to medication regimen for blood pressure control with min assist from family. Pt will demonstrate safety precaution to take upon discharge to prevent falls with cues and reminders from family. Outcome: Progressing

## 2019-03-13 NOTE — Progress Notes (Signed)
[facility] PHYSICAL MEDICINE & REHABILITATION PROGRESS NOTE  Subjective/Complaints: Patient seen sitting up in bed this morning, working with therapies.  She states she did not sleep well overnight due to people coming in/out of room.  ROS: Denies CP, SOB, N/V/D  Objective: Vital Signs: Blood pressure 122/70, pulse 76, temperature 98.4 F (36.9 C), resp. rate 18, SpO2 94 %. No results found. Recent Labs    03/12/19 0615 03/13/19 0527  WBC 5.9 4.8  HGB 13.7 13.6  HCT 42.8 41.4  PLT 170 181   Recent Labs    03/12/19 0615 03/13/19 0527  NA 139 139  K 3.7 3.9  CL 107 108  CO2 21* 21*  GLUCOSE 110* 109*  BUN 6* 10  CREATININE 0.61 0.62  CALCIUM 9.0 9.5    Physical Exam: BP 122/70 (BP Location: Right Arm)   Pulse 76   Temp 98.4 F (36.9 C)   Resp 18   SpO2 94%  Constitutional: No distress . Vital signs reviewed. HENT: Normocephalic.  Atraumatic. Eyes: EOMI. No discharge. Cardiovascular: No JVD. Respiratory: Normal effort.  No stridor. GI: Non-distended. Skin: Warm and dry.  Intact. Psych: Slowed.  Delayed. Musc: No edema in extremities.  No tenderness in extremities. Neurological:  Alert and oriented, except for date Motor: Bilateral upper extremities: 4/5 proximal distal  right lower extremity: 3+/5 proximal distal Left lower extremity: 4 -/5 proximal to distal  Visual deficits   Assessment/Plan: 1. Functional deficits secondary to left cerebellar hematoma which require 3+ hours per day of interdisciplinary therapy in a comprehensive inpatient rehab setting.  Physiatrist is providing close team supervision and 24 hour management of active medical problems listed below.  Physiatrist and rehab team continue to assess barriers to discharge/monitor patient progress toward functional and medical goals  Care Tool:  Bathing              Bathing assist       Upper Body Dressing/Undressing Upper body dressing        Upper body assist      Lower  Body Dressing/Undressing Lower body dressing            Lower body assist       Toileting Toileting    Toileting assist Assist for toileting: Dependent - Patient 0%     Transfers Chair/bed transfer  Transfers assist           Locomotion Ambulation   Ambulation assist              Walk 10 feet activity   Assist           Walk 50 feet activity   Assist           Walk 150 feet activity   Assist           Walk 10 feet on uneven surface  activity   Assist           Wheelchair     Assist               Wheelchair 50 feet with 2 turns activity    Assist            Wheelchair 150 feet activity     Assist            Medical Problem List and Plan: 1. Balance deficits with left lean as well as cognitive deficits affecting ADLs and mobility secondary to  left cerebellar hematoma with penetration of 4th ventricle and dependent within  occipital horns and lateral ventricles  Begin therapy evaluations  Team conference today to discuss current and goals and coordination of care, home and environmental barriers, and discharge planning with nursing, case manager, and therapies.  2.  Antithrombotics: -DVT/anticoagulation:  Pharmaceutical: Lovenox             -antiplatelet therapy: N/A 3. HA/back pain/Pain Management: Will continue tylenol prn  4. Mood: LCSW to follow for evaluation and support.              -antipsychotic agents: N/A 5. Neuropsych: This patient is not fully capable of making decisions on her own behalf. 6. Skin/Wound Care: Routine pressure relief measures.  Maintain adequate nutrition and hydration status. 7. Fluids/Electrolytes/Nutrition: Monitor intake and output.  Offer nutritional supplements as needed Po intake.   8.  HTN: Monitor orthostatic blood pressures.  Patient reports episodic syncope with standing.  Continue Cardizem daily     Monitor with increased mobility 9.  Macular  degeneration: Visual hallucinations have been worsening.  Exacerbated by bilateral cataracts as well as elevated pressures left eye.    Started Xalatan (new RX per family)  53. Urinary retention:              Significant urinary retention with I/O cath of 2200 yesterday  Will consider medications 11. GERD: Continue Protonix. 12.  Hypoalbuminemia  Supplement initiated on 10/21 13. Constipation  Bowel meds increased on 10/21  LOS: 1 days A FACE TO FACE EVALUATION WAS PERFORMED  Nicholai Willette Lorie Phenix 03/13/2019, 12:13 PM

## 2019-03-13 NOTE — Progress Notes (Signed)
On call provider Algis Liming, PA (on call provider), Pam gave verbal order to hold the Cardizem due to pts vital signs.

## 2019-03-13 NOTE — Evaluation (Signed)
Occupational Therapy Assessment and Plan  Patient Details  Name: Beth Foster MRN: [id number] Date of Birth: Jul 18, 1928  OT Diagnosis: abnormal posture, cognitive deficits and muscle weakness (generalized) Rehab Potential: Rehab Potential (ACUTE ONLY): Excellent ELOS: 12-14 days   Today's Date: 03/13/2019 OT Individual Time: 1003-1103 OT Individual Time Calculation (min): 60 min     Problem List:  Patient Active Problem List   Diagnosis Date Noted  . Hypoalbuminemia due to protein-calorie malnutrition ([place])   . Urinary retention   . Benign essential HTN   . Essential hypertension 03/12/2019  . Hyperlipidemia 03/12/2019  . UTI (urinary tract infection) 03/12/2019  . Enterococcus UTI   . Macular degeneration   . Cognitive deficit, post-stroke   . Pressure injury of skin 03/09/2019  . ICH (intracerebral hemorrhage) (H[facility]) w/ IVH, L cerebellar 03/08/2019  . Vascular dementia without behavioral disturbance ([place]) 02/28/2019  . Cerebrovascular disease 02/28/2019  . Visual field cut 02/28/2019  . Slow transit constipation 02/28/2019  . Rectal bleeding 02/28/2019  . Venous insufficiency 07/15/2016  . Visual hallucinations 07/15/2016  . OAB (overactive bladder) 07/15/2016  . Cellulitis 07/09/2016  . Cellulitis of right lower extremity 07/09/2016  . Neck pain 01/15/2016  . Left cervical lymphadenopathy 01/08/2016  . Cough 06/09/2015  . Dyspnea 06/09/2015  . Acute bronchitis 06/09/2015  . Allergic rhinitis 04/15/2015  . Syncope and collapse 01/10/2015  . Contusion of left supra orbit 01/10/2015  . Laceration of right eyebrow 01/10/2015  . DOE (dyspnea on exertion) 01/10/2015  . Exertional chest pain 01/10/2015  . Fatigue 01/10/2015  . Acute hyperglycemia 01/10/2015  . Hemorrhoids 10/28/2013  . Vitamin D deficiency 10/28/2013  . Painful lumpy right breast 12/06/2012  . HLD (hyperlipidemia) 12/06/2012  . Essential hypertension, benign 12/06/2012  . Malignant neoplasm of breast  (female), unspecified site   . Mild cognitive impairment with memory loss 08/20/2012  . Gait disorder 08/20/2012  . Wound, open, head 08/20/2012  . GERD (gastroesophageal reflux disease) 08/20/2012    Past Medical History:  Past Medical History:  Diagnosis Date  . Amaurosis fugax   . Anxiety   . Anxiety state, unspecified   . Bunion   . Cataracts, bilateral   . Sherran Needs syndrome   . Cognitive decline   . Contact dermatitis and other eczema due to other chemical products   . Edema   . External hemorrhoids without mention of complication   . GERD (gastroesophageal reflux disease)   . Glaucoma   . Hypertension   . Macular degeneration of both eyes   . Malignant neoplasm of breast (female), unspecified site dx'd 1996   rt breast; xrt   . Pain in joint, pelvic region and thigh   . Rash and other nonspecific skin eruption   . Stroke (cerebrum) ([place])    with decrease in [facility] of right hand.   . Stroke ([place])   . Unspecified cataract   . Unspecified late effects of cerebrovascular disease   . Viral hepatitis A without mention of hepatic coma   . Viral hepatitis A without mention of hepatic coma   . Vitamin D deficiency    Past Surgical History:  Past Surgical History:  Procedure Laterality Date  . BREAST LUMPECTOMY  1995  . CARDIAC CATHETERIZATION  2004  . CHOLECYSTECTOMY  1972    Assessment & Plan Clinical Impression: Patient is a 85 y.o. year old female with recent admission to the hospital on 03/08/2019 after found down and CT head done revealing small cerebellar peduncle  hemorrhage with mild surrounding edema.  She noted she was having dysuria and was started on antibiotics for Enterococcus faecalis CTA head/neck showed tortuosity and dilatation of supraclinoid ICA and no large vessel occlusion. Follow up CT head showed stable left medial cerebellar hemorrhage with extension into left ventricle.  No mass-effect noted.  MRI brain showed stable left cerebellar hematoma with  penetration of 4th ventricle and dependent within occipital horns and lateral ventricles. Extensive chronic small vessel disease noted.  Pt transferred to CIR on 03/12/2019 .    Patient currently requires mod with basic self-care skills secondary to muscle weakness, decreased visual acuity, decreased memory and decreased sitting balance, decreased standing balance, decreased postural control and decreased balance strategies.  Prior to hospitalization, patient could complete ADLs with modified independent .  Patient will benefit from skilled intervention to decrease level of assist with basic self-care skills and increase independence with basic self-care skills prior to discharge home with care partner.  Anticipate patient will require 24 hour supervision and follow up home health.  OT - End of Session Activity Tolerance: Decreased this session Endurance Deficit: Yes Endurance Deficit Description: Pt with stated fatigue post ADL session OT Assessment Rehab Potential (ACUTE ONLY): Excellent OT Patient demonstrates impairments in the following area(s): Balance;Cognition;Endurance;Motor;Vision;Safety;Pain OT Basic ADL's Functional Problem(s): Eating;Grooming;Bathing;Dressing;Toileting OT Transfers Functional Problem(s): Toilet;Tub/Shower OT Additional Impairment(s): None OT Plan OT Intensity: Minimum of 1-2 x/day, 45 to 90 minutes OT Frequency: 5 out of 7 days OT Duration/Estimated Length of Stay: 12-14 days OT Treatment/Interventions: Balance/vestibular training;Discharge planning;Pain management;Therapeutic Activities;Self Care/advanced ADL retraining;UE/LE Coordination activities;Cognitive remediation/compensation;Disease mangement/prevention;Functional mobility training;Patient/family education;Therapeutic Exercise;Visual/perceptual remediation/compensation;Wheelchair propulsion/positioning;UE/LE Strength taining/ROM;Neuromuscular re-education;DME/adaptive equipment instruction;Community  reintegration OT Self Feeding Anticipated Outcome(s): modified independent OT Basic Self-Care Anticipated Outcome(s): supervision to min assist OT Toileting Anticipated Outcome(s): supervision OT Bathroom Transfers Anticipated Outcome(s): supervision OT Recommendation Patient destination: Home Follow Up Recommendations: Home health OT;24 hour supervision/assistance Equipment Recommended: To be determined   Skilled Therapeutic Intervention Pt completed supine to sit EOB with mod assist.  Noted vertical nystagmus with pt sitting statically.  Increased posterior lean with posterior pelvic tilt present when sitting EOB.  She was able to complete stand pivot transfer to the wheelchair with mod assist in order to work on ADL retraining at the sink.  Supervision for UB bathing with mod assist for donning bra and button up shirt.  Pt reported difficulty with buttons prior to this secondary to her macular degeneration.  She needed mod assist for donning pants with total assist for donning gripper socks secondary to decreased time.  She reported increased fatigue with increased dizziness when reaching down to her feet when washing them.  Finished session with pt in the wheelchair with call button and phone in reach and safety belt in place.    OT Evaluation Precautions/Restrictions  Precautions Precautions: Fall Restrictions Weight Bearing Restrictions: No  Pain Pain Assessment Pain Score: 0-No pain Home Living/Prior Functioning Home Living Living Arrangements: Alone Available Help at Discharge: Family(going to granddaughters at discharge) Type of Home: House Home Access: Stairs to enter [employer] of Steps: ? Entrance Stairs-Rails: Can reach both Home Layout: Two level Alternate Level Stairs-Number of Steps: BR on entry level Bathroom Shower/Tub: Optometrist: Yes Additional Comments: owns a rollator  Lives With: Alone IADL  History Homemaking Responsibilities: Yes Laundry Responsibility: Primary Current License: No Education: [employer]  Occupation: Retired Prior Function Level of Independence: Requires assistive device for independence  Able to Take Stairs?: Yes  Driving: No Vocation: Retired Leisure: Hobbies-yes (Comment) Comments: grandaughter provided/confirmed all information; they were at point of considering moving pt to her home and plan to do so after discharge ADL ADL Eating: Set up Where Assessed-Eating: Edge of bed Grooming: Setup Where Assessed-Grooming: Wheelchair Upper Body Bathing: Supervision/safety Where Assessed-Upper Body Bathing: Wheelchair Lower Body Bathing: Moderate assistance Where Assessed-Lower Body Bathing: Wheelchair Upper Body Dressing: Moderate assistance Where Assessed-Upper Body Dressing: Wheelchair Lower Body Dressing: Moderate assistance Where Assessed-Lower Body Dressing: Wheelchair Toileting: Moderate assistance Where Assessed-Toileting: Bedside Commode Toilet Transfer: Moderate assistance Toilet Transfer Method: Stand pivot Toilet Transfer Equipment: Bedside commode Vision Baseline Vision/History: Wears glasses;Macular Degeneration Wears Glasses: At all times Patient Visual Report: No change from baseline Vision Assessment?: Vision impaired- to be further tested in functional context Additional Comments: Pt with increased dizziness with positional changes noted with resting horizontal nystagmus present as well. Perception  Perception: Within Functional Limits Praxis Praxis: Intact Cognition Overall Cognitive Status: No family/caregiver present to determine baseline cognitive functioning Arousal/Alertness: Awake/alert Orientation Level: Person;Place;Situation Person: Oriented Place: Oriented Situation: Oriented Year: 2020 Month: October Day of Week: Correct Memory: Impaired Memory Impairment: Decreased recall of new information;Storage  deficit;Retrieval deficit Immediate Memory Recall: Sock;Blue;Bed Memory Recall Sock: Not able to recall Memory Recall Blue: Not able to recall Memory Recall Bed: Without Cue Attention: Sustained;Selective Sustained Attention: Appears intact Selective Attention: Impaired Selective Attention Impairment: Functional basic Awareness: Impaired Awareness Impairment: Emergent impairment Problem Solving: Impaired Problem Solving Impairment: Functional basic Safety/Judgment: Impaired Comments: Therapist will continue to further eval cognition in functional context with regards to OT tasks. Sensation Sensation Light Touch: Appears Intact Hot/Cold: Appears Intact Proprioception: Appears Intact Stereognosis: Appears Intact Coordination Gross Motor Movements are Fluid and Coordinated: Yes Fine Motor Movements are Fluid and Coordinated: Yes(FM and gross moto coordination [place] for ADL tasks will continue to assess further in functional context.) Heel Shin Test: = bil; WNLS Motor  Motor Motor: Abnormal postural alignment and control Motor - Skilled Clinical Observations: scoliosis- abnormal alignment Mobility  Bed Mobility Bed Mobility: Supine to Sit Rolling Right: Moderate Assistance - Patient 50-74% Rolling Left: Supervision/Verbal cueing Sit to Supine: Minimal Assistance - Patient > 75% Transfers Sit to Stand: Moderate Assistance - Patient 50-74% Stand to Sit: Moderate Assistance - Patient 50-74%  Trunk/Postural Assessment  Cervical Assessment Cervical Assessment: Exceptions to WFL(flexed and protracted) Thoracic Assessment Thoracic Assessment: Exceptions to WFL(thoracic rounding secondary to kyphosis) Lumbar Assessment Lumbar Assessment: Exceptions to WFL(posterior pelvic tilt) Postural Control Postural Control: Deficits on evaluation Trunk Control: posterior lean in sitting Righting Reactions: delayed trunk righting Protective Responses: absent; delayed hip strategy and  inadequate ankle strategy Postural Limitations: scoliosis; LLE shorter than RLE, chronic  Balance Balance Balance Assessed: Yes Static Sitting Balance Static Sitting - Balance Support: Feet unsupported Static Sitting - Level of Assistance: 4: Min assist Dynamic Sitting Balance Dynamic Sitting - Balance Support: Feet unsupported Dynamic Sitting - Level of Assistance: 3: Mod assist Static Standing Balance Static Standing - Balance Support: During functional activity Static Standing - Level of Assistance: 3: Mod assist Dynamic Standing Balance Dynamic Standing - Balance Support: During functional activity Dynamic Standing - Level of Assistance: 2: Max assist Extremity/Trunk Assessment RUE Assessment RUE Assessment: Within Functional Limits LUE Assessment LUE Assessment: Within Functional Limits     Refer to Care Plan for Long Term Goals  Recommendations for other services: None    Discharge Criteria: Patient will be discharged from OT if patient refuses treatment 3 consecutive times without medical reason, if treatment  goals not met, if there is a change in medical status, if patient makes no progress towards goals or if patient is discharged from hospital.  The above assessment, treatment plan, treatment alternatives and goals were discussed and mutually agreed upon: by patient  Tashiana Lamarca OTR/L 03/13/2019, 4:39 PM

## 2019-03-13 NOTE — Evaluation (Signed)
Physical Therapy Assessment and Plan  Patient Details  Name: Beth Foster MRN: [id number] Date of Birth: [birth year]  PT Diagnosis: Abnormal posture, Abnormality of gait, Cognitive deficits, Contracture of joint: R ankle, Dizziness and giddiness, Hemiparesis non-dominant and Vertigo of central origin Rehab Potential: Good ELOS: 14-17   Today's Date: 03/13/2019 PT Individual Time: 1300-1415 PT Individual Time Calculation (min): 75 min    Problem List:  Patient Active Problem List   Diagnosis Date Noted  . Hypoalbuminemia due to protein-calorie malnutrition ([place])   . Urinary retention   . Benign essential HTN   . Essential hypertension 03/12/2019  . Hyperlipidemia 03/12/2019  . UTI (urinary tract infection) 03/12/2019  . Enterococcus UTI   . Macular degeneration   . Cognitive deficit, post-stroke   . Pressure injury of skin 03/09/2019  . ICH (intracerebral hemorrhage) (HCC) w/ IVH, L cerebellar 03/08/2019  . Vascular dementia without behavioral disturbance ([place]) 02/28/2019  . Cerebrovascular disease 02/28/2019  . Visual field cut 02/28/2019  . Slow transit constipation 02/28/2019  . Rectal bleeding 02/28/2019  . Venous insufficiency 07/15/2016  . Visual hallucinations 07/15/2016  . OAB (overactive bladder) 07/15/2016  . Cellulitis 07/09/2016  . Cellulitis of right lower extremity 07/09/2016  . Neck pain 01/15/2016  . Left cervical lymphadenopathy 01/08/2016  . Cough 06/09/2015  . Dyspnea 06/09/2015  . Acute bronchitis 06/09/2015  . Allergic rhinitis 04/15/2015  . Syncope and collapse 01/10/2015  . Contusion of left supra orbit 01/10/2015  . Laceration of right eyebrow 01/10/2015  . DOE (dyspnea on exertion) 01/10/2015  . Exertional chest pain 01/10/2015  . Fatigue 01/10/2015  . Acute hyperglycemia 01/10/2015  . Hemorrhoids 10/28/2013  . Vitamin D deficiency 10/28/2013  . Painful lumpy right breast 12/06/2012  . HLD (hyperlipidemia) 12/06/2012  . Essential  hypertension, benign 12/06/2012  . Malignant neoplasm of breast (female), unspecified site   . Mild cognitive impairment with memory loss 08/20/2012  . Gait disorder 08/20/2012  . Wound, open, head 08/20/2012  . GERD (gastroesophageal reflux disease) 08/20/2012    Past Medical History:  Past Medical History:  Diagnosis Date  . Amaurosis fugax   . Anxiety   . Anxiety state, unspecified   . Bunion   . Cataracts, bilateral   . Sherran Needs syndrome   . Cognitive decline   . Contact dermatitis and other eczema due to other chemical products   . Edema   . External hemorrhoids without mention of complication   . GERD (gastroesophageal reflux disease)   . Glaucoma   . Hypertension   . Macular degeneration of both eyes   . Malignant neoplasm of breast (female), unspecified site dx'd 1996   rt breast; xrt   . Pain in joint, pelvic region and thigh   . Rash and other nonspecific skin eruption   . Stroke (cerebrum) ([place])    with decrease in [employer] of right hand.   . Stroke ([place])   . Unspecified cataract   . Unspecified late effects of cerebrovascular disease   . Viral hepatitis A without mention of hepatic coma   . Viral hepatitis A without mention of hepatic coma   . Vitamin D deficiency    Past Surgical History:  Past Surgical History:  Procedure Laterality Date  . BREAST LUMPECTOMY  1995  . CARDIAC CATHETERIZATION  2004  . CHOLECYSTECTOMY  1972    Assessment & Plan Clinical Impression:  Patient transferred to CIR on 03/12/2019 .   Patient currently requires max with mobility secondary to muscle  joint tightness, decreased cardiorespiratoy endurance, unbalanced muscle activation, decreased visual acuity, central origin and decreased sitting balance, decreased standing balance, decreased postural control, hemiplegia and decreased balance strategies.  Prior to hospitalization, patient was modified independent  with mobility and lived with Alone in a House home.  Home access is  ?Stairs to enter.  Patient will benefit from skilled PT intervention to maximize safe functional mobility, minimize fall risk and decrease caregiver burden for planned discharge home with 24 hour supervision.  Anticipate patient will benefit from follow up [place] at discharge.  PT - End of Session Activity Tolerance: Tolerates 10 - 20 min activity with multiple rests Endurance Deficit: Yes Endurance Deficit Description: pt fatigued after propelling w/c x 25' PT Assessment Rehab Potential (ACUTE/IP ONLY): Good PT Patient demonstrates impairments in the following area(s): Balance;Safety;Endurance;Motor PT Transfers Functional Problem(s): Bed Mobility;Bed to Chair;Car;Furniture PT Locomotion Functional Problem(s): Ambulation;Wheelchair Mobility;Stairs PT Plan PT Intensity: Minimum of 1-2 x/day ,45 to 90 minutes PT Frequency: 5 out of 7 days PT Duration Estimated Length of Stay: 14-17 PT Treatment/Interventions: Ambulation/gait training;Cognitive remediation/compensation;Discharge planning;DME/adaptive equipment instruction;Pain management;Functional mobility training;Psychosocial support;Splinting/orthotics;Therapeutic Activities;UE/LE Strength taining/ROM;Visual/perceptual remediation/compensation;Balance/vestibular training;Community reintegration;Neuromuscular re-education;Patient/family education;Stair training;Therapeutic Exercise;UE/LE Coordination activities;Wheelchair propulsion/positioning PT Transfers Anticipated Outcome(s): S baric tra, min assist car PT Locomotion Anticipated Outcome(s): S gait x 50' wiht LRAD, S w/c x 50' , S up/down 4 steps bil rails PT Recommendation Recommendations for Other Services: Neuropsych consult(pt somewhat confused at end of session; question dementia?) Follow Up Recommendations: Home health PT Patient destination: Home Equipment Recommended: To be determined  Skilled Therapeutic Intervention:  Pt seated in w/c.  She stated that she was tired and almost  falling asleep.  Eval completed;  ELOS and POC discussed with pt.  She will d/c to her granddaughter's house; she knows that there are STE, but does not remember how many; there is 1 rail.  Pt aware that she has scoliosis, which appears to be C curve without secondary curve.  She has not worn a shoe lift or insert since she was a child, but this may be helpful in light of recent CVA with significant L lean during gait.  R heel cord stretch in sitting with contract/relax/contract.  PT able to lock/unlock w/c brakes with tactile and Verbal cues to find brakes. Pt exhausted and asking to get back to bed.  Upon entering room, she did not recognize it and stated it wasn't her room.  PT reassured her; pt worried that her granddaughter would not be able to find her when she visits tonight.  PT provided assurance about this.  Sit> supine with min assist.  Pt rolled to L side and PT made pt comfortable with pillow between knees.  Alarm set and needs left at hand.   PT Evaluation Precautions/Restrictions Precautions Precautions: Fall Restrictions Weight Bearing Restrictions: No   Pain Pain Assessment Pain Score: 0-No pain Home Living/Prior Functioning Home Living Living Arrangements: Alone Available Help at Discharge: Family Will go to granddaughter's house Type of Home: House Home Access: Stairs to enter [employer] of Steps: ? At [employer]: Can reach both Home Layout: Two level  BR on entry level Bathroom Shower/Tub: Chiropodist: Standard Bathroom Accessibility: Yes Additional Comments: owns a rollator  Lives With: Alone Prior Function Level of Independence: Independent with gait(used SPC)  Able to Take Stairs?: Yes Driving: No Vocation: Retired Leisure: Hobbies-yes (Comment)(singing, playing piano) Vision/Perception  Visual deficits, central vision due to macular degeneration Vision - Assessment Additional Comments: can  read  large, dark print  Cognition Overall Cognitive Status: No family/caregiver present to determine baseline cognitive functioning Arousal/Alertness: Awake/alert Orientation Level: Oriented to person;Disoriented to place;Disoriented to situation;Disoriented to time Attention: Selective;Sustained Sustained Attention: Appears intact Selective Attention: Impaired Selective Attention Impairment: Verbal basic Memory: Impaired Memory Impairment: Decreased recall of new information;Storage deficit;Retrieval deficit Awareness: Impaired Awareness Impairment: Emergent impairment Problem Solving: Impaired Problem Solving Impairment: Functional basic Safety/Judgment: Impaired Sensation Sensation Light Touch: Appears Intact Proprioception: Appears Intact Coordination Heel Shin Test: = bil; WNLS Motor  Motor Motor: Abnormal postural alignment and control Motor - Skilled Clinical Observations: scoliosis- abnormal alignment  Mobility Bed Mobility Bed Mobility: Rolling Right;Rolling Left;Sit to Supine Rolling Right: Supervision/verbal cueing Rolling Left: Supervision/Verbal cueing Sit to Supine: Minimal Assistance - Patient > 75% Transfers Transfers: Stand Pivot Transfers Stand Pivot Transfers: Moderate Assistance - Patient 50 - 74% Stand Pivot Transfer Details: Verbal cues for technique;Manual facilitation for weight shifting;Verbal cues for precautions/safety Transfer (Assistive device): None Locomotion  Gait Ambulation: Yes Gait Assistance: Maximal Assistance - Patient 25-49% Gait Distance (Feet): 10 Feet Assistive device: None;1 person hand held assist Gait Gait: Yes Gait Pattern: Impaired Gait Pattern: Decreased weight shift to right;Lateral trunk lean to left;Narrow base of support;Trunk flexed;Decreased step length - right;Decreased step length - left;Step-through pattern Stairs / Additional Locomotion Stairs: Yes Stair Management Technique: Forwards;Two rails Number of Stairs:  4 Height of Stairs: 6 Wheelchair Mobility Wheelchair Mobility: Yes Wheelchair Assistance: Minimal assistance - Patient >75% Wheelchair Propulsion: Both upper extremities Wheelchair Parts Management: Needs assistance Distance: 25  Trunk/Postural Assessment  Cervical Assessment Cervical Assessment: Exceptions to WFL(limited extension) Thoracic Assessment Thoracic Assessment: Exceptions to WFL(kyphotic, C curve scoliosis, R convexity) Lumbar Assessment Lumbar Assessment: Exceptions to WFL(psoterior pelvic tilt, Increased wt bearing L) Postural Control Postural Control: Deficits on evaluation Trunk Control: immediate posterior > L lean upon standing Righting Reactions: delayed trunk righting Protective Responses: absent; delayed hip strategy and inadequate ankle strategy Postural Limitations: scoliosis; LLE shorter than RLE, chronic  Balance Balance Balance Assessed: Yes Dynamic Sitting Balance Dynamic Sitting - Level of Assistance: 5: Stand by assistance Static Standing Balance Static Standing - Level of Assistance: 3: Mod assist  Upon standing up, pt has immediate LOB backwards.  Extremity Assessment      RLE Assessment RLE Assessment: Within Functional Limits Passive Range of Motion (PROM) Comments: -10 ankle DF General Strength Comments: WFLS LLE Assessment LLE Assessment: Within Functional Limits Passive Range of Motion (PROM) Comments: 5 degrees ankle DF General Strength Comments: grossly [place]    Refer to Care Plan for Long Term Goals  Recommendations for other services: Neuropsych; chart relates visual hallucinations due to macular degeneration; ? dementia  Discharge Criteria: Patient will be discharged from PT if patient refuses treatment 3 consecutive times without medical reason, if treatment goals not met, if there is a change in medical status, if patient makes no progress towards goals or if patient is discharged from hospital.  The above assessment,  treatment plan, treatment alternatives and goals were discussed and mutually agreed upon: by patient  Iceis Knab 03/13/2019, 4:13 PM

## 2019-03-13 NOTE — Care Management Note (Signed)
Inpatient Rehabilitation Center Individual Statement of Services  Patient Name:  Beth Foster  Date:  03/13/2019  Welcome to the [facility].  Our goal is to provide you with an individualized program based on your diagnosis and situation, designed to meet your specific needs.  With this comprehensive rehabilitation program, you will be expected to participate in at least 3 hours of rehabilitation therapies Monday-Friday, with modified therapy programming on the weekends.  Your rehabilitation program will include the following services:  Physical Therapy (PT), Occupational Therapy (OT), Speech Therapy (ST), 24 hour per day rehabilitation nursing, Neuropsychology, Case Management (Social Worker), Rehabilitation Medicine, Nutrition Services and Pharmacy Services  Weekly team conferences will be held on Wednesday to discuss your progress.  Your Social Worker will talk with you frequently to get your input and to update you on team discussions.  Team conferences with you and your family in attendance may also be held.  Expected length of stay: 14-17 days  Overall anticipated outcome: supervision with some min assist for OT  Depending on your progress and recovery, your program may change. Your Social Worker will coordinate services and will keep you informed of any changes. Your Social Worker's name and contact numbers are listed  below.  The following services may also be recommended but are not provided by the [facility]:    [place] will be made to provide these services after discharge if needed.  Arrangements include referral to agencies that provide these services.  Your insurance has been verified to be: [employer]-Medicare  Your primary doctor is:  Hollace Kinnier  Pertinent information will be shared with your doctor and your insurance company.  Social Worker:  Ovidio Kin, [place] or ([phone]  Information discussed with and copy given to patient by: Elease Hashimoto, 03/13/2019, 2:31 PM

## 2019-03-13 NOTE — Progress Notes (Signed)
Initial Nutrition Assessment  RD working remotely.  DOCUMENTATION CODES:   Not applicable  INTERVENTION:   - Ensure Enlive po BID, each supplement provides 350 kcal and 20 grams of protein  - MVI with minerals daily  NUTRITION DIAGNOSIS:   Increased nutrient needs related to wound healing as evidenced by estimated needs.  GOAL:   Patient will meet greater than or equal to 90% of their needs  MONITOR:   PO intake, Supplement acceptance, Weight trends, Skin  REASON FOR ASSESSMENT:   Malnutrition Screening Tool    ASSESSMENT:   85 year old female with PMH of HTN, prior stroke with decreased [place] RUE, glaucoma, macular degeneration with worsening of vision and recent reports of amaurosis fugax. Pt was admitted on 03/08/19 after found down and CT head done revealing small cerebellar peduncle hemorrhage with mild surrounding edema. CTA head/neck showed tortuosity and dilatation of supraclinoid ICA and no large vessel occlusion. MRI brain showed stable left cerebellar hematoma with penetration of 4th ventricle and dependent within occipital horns and lateral ventricles. Extensive chronic small vessel disease noted. Pt admitted to CIR on 10/20.   Unable to reach pt via phone call to room. Will attempt to obtain diet and weight history at follow-up.  Reviewed weight history in chart. Pt with progressive weight loss over the last 8 months. Pt with a 5.1 kg weight loss since 07/09/18. This is a 7.4% weight loss which is not quite significant for timeframe.  RD will order an oral nutrition supplement to aid pt in meeting kcal and protein needs during admission.  Meal Completion: 15%, 80%  Medications reviewed and include: Protonix, Senna  Labs reviewed.  UOP: 2600 ml x 24 hours  NUTRITION - FOCUSED PHYSICAL EXAM:  Unable to complete at this time. RD working remotely.  Diet Order:   Diet Order            DIET DYS 3 Room service appropriate? Yes with Assist; Fluid consistency:  Thin  Diet effective now              EDUCATION NEEDS:   No education needs have been identified at this time  Skin:  Skin Assessment: Skin Integrity Issues: Skin Integrity Issues: DTI: sacrum, head Stage I: sacrum  Last BM:  no documented BM  Height:   Ht Readings from Last 1 Encounters:  03/09/19 5\' 5"  (1.651 m)    Weight:   Wt Readings from Last 1 Encounters:  03/09/19 63.8 kg    Ideal Body Weight:  56.8 kg  BMI: 23.41 kg/m^2  Estimated Nutritional Needs:   Kcal:  1450-1650  Protein:  70-85 grams  Fluid:  1.4-1.6 L    Gaynell Face, MS, RD, LDN Inpatient Clinical Dietitian Pager: [phone] Weekend/After Hours: [phone]

## 2019-03-13 NOTE — Progress Notes (Signed)
Inpatient Rehabilitation  Patient information reviewed and entered into eRehab system by Grady Mohabir M. Phebe Dettmer, M.A., CCC/SLP, PPS Coordinator.  Information including medical coding, functional ability and quality indicators will be reviewed and updated through discharge.    

## 2019-03-13 NOTE — Evaluation (Signed)
Speech Language Pathology Assessment and Plan  Patient Details  Name: Beth Foster MRN: [id number] Date of Birth: [birth year]  SLP Diagnosis: Speech and Language deficits  Rehab Potential: Good ELOS: 10-14 days    Today's Date: 03/13/2019 SLP Individual Time: 0804-0900 SLP Individual Time Calculation (min): 56 min   Problem List:  Patient Active Problem List   Diagnosis Date Noted  . Hypoalbuminemia due to protein-calorie malnutrition ([place])   . Urinary retention   . Benign essential HTN   . Essential hypertension 03/12/2019  . Hyperlipidemia 03/12/2019  . UTI (urinary tract infection) 03/12/2019  . Enterococcus UTI   . Macular degeneration   . Cognitive deficit, post-stroke   . Pressure injury of skin 03/09/2019  . ICH (intracerebral hemorrhage) (HCC) w/ IVH, L cerebellar 03/08/2019  . Vascular dementia without behavioral disturbance ([place]) 02/28/2019  . Cerebrovascular disease 02/28/2019  . Visual field cut 02/28/2019  . Slow transit constipation 02/28/2019  . Rectal bleeding 02/28/2019  . Venous insufficiency 07/15/2016  . Visual hallucinations 07/15/2016  . OAB (overactive bladder) 07/15/2016  . Cellulitis 07/09/2016  . Cellulitis of right lower extremity 07/09/2016  . Neck pain 01/15/2016  . Left cervical lymphadenopathy 01/08/2016  . Cough 06/09/2015  . Dyspnea 06/09/2015  . Acute bronchitis 06/09/2015  . Allergic rhinitis 04/15/2015  . Syncope and collapse 01/10/2015  . Contusion of left supra orbit 01/10/2015  . Laceration of right eyebrow 01/10/2015  . DOE (dyspnea on exertion) 01/10/2015  . Exertional chest pain 01/10/2015  . Fatigue 01/10/2015  . Acute hyperglycemia 01/10/2015  . Hemorrhoids 10/28/2013  . Vitamin D deficiency 10/28/2013  . Painful lumpy right breast 12/06/2012  . HLD (hyperlipidemia) 12/06/2012  . Essential hypertension, benign 12/06/2012  . Malignant neoplasm of breast (female), unspecified site   . Mild cognitive impairment with  memory loss 08/20/2012  . Gait disorder 08/20/2012  . Wound, open, head 08/20/2012  . GERD (gastroesophageal reflux disease) 08/20/2012   Past Medical History:  Past Medical History:  Diagnosis Date  . Amaurosis fugax   . Anxiety   . Anxiety state, unspecified   . Bunion   . Cataracts, bilateral   . Sherran Needs syndrome   . Cognitive decline   . Contact dermatitis and other eczema due to other chemical products   . Edema   . External hemorrhoids without mention of complication   . GERD (gastroesophageal reflux disease)   . Glaucoma   . Hypertension   . Macular degeneration of both eyes   . Malignant neoplasm of breast (female), unspecified site dx'd 1996   rt breast; xrt   . Pain in joint, pelvic region and thigh   . Rash and other nonspecific skin eruption   . Stroke (cerebrum) ([place])    with decrease in [facility] of right hand.   . Stroke ([place])   . Unspecified cataract   . Unspecified late effects of cerebrovascular disease   . Viral hepatitis A without mention of hepatic coma   . Viral hepatitis A without mention of hepatic coma   . Vitamin D deficiency    Past Surgical History:  Past Surgical History:  Procedure Laterality Date  . BREAST LUMPECTOMY  1995  . CARDIAC CATHETERIZATION  2004  . CHOLECYSTECTOMY  1972    Assessment / Plan / Recommendation Clinical Impression Pt is a 85 y/o female with PMH of HTN who presented to [facility] on 10/16 with 2 day history of unsteady gait and R gaze preference. She was found by family on  day of admission and brought to ED where CT revealed a small cerebellar peduncle hemorrhage. CTA tortuosity and dilatation of supraclinoid ICA on the R>L. Repeat CT and MRI on 10/17 showed stable hematoma. No cardiac source of emobli identified. Permissive hypertension for perfusion. Potassium supplement for hypokalemia. Therapy evaluations were completed and pt was recommended for CIR.   Pt presents with severe immediate/short term recall of novel  information, impacting basic problem solving/auditory comprehension, emergent awareness, selective attention, orientation and word finding deficits in higher level tasks.  Per chart review pt has baseline early dementia (pt agreed to intermittent confusion and memory changes, but not diagnosis) and macular denegation. Pt demonstrated 14 out 22 on MOCA Blind (n=>22) with deficits in immediate recall 3/5 words, short term recall 2/5 words, 4/5 with cues, serial subtraction, orientation, divergent and abstract naming.  SLP suggest serial subtraction and naming tasks further impacted by memory deficits. Pt demonstrated basic problem solving in bed with ability to utilize call bell given assistance for visual impairment, follow 1 step commands and 2-3 step commands in structured task, however given more functional commands pt required intermittent repetition. Pt demonstrated mild expressive aphasia pertaining to word finding in simple conversation and higher level word finding tasks (divergent and abstract naming), with ability to follow most 1-3 step commands, express wants/needs, answer yes/no complex questions, repeat at sentence level, and name 10/10 items in room. Visual deficits impacted pt's ability to identify objects and actions on picture cards. Pt would benefit from skilled ST services in order to maximize functional independence and reduce burden of care, likely requiring 24 hour supervision and continue ST services.   Skilled Therapeutic Interventions          Skilled ST services focused on cognitive skills. SLP administered  cognitive linguistic assessment, educated pt on results and created plan to address deficits. All questions were answered to satisfaction. Pt was left in room with call bell within reach and bed alarm set. ST recommends to continue skilled ST services.  SLP Assessment  Patient will need skilled Speech Lanaguage Pathology Services during CIR admission    Recommendations  SLP Diet  Recommendations: Thin Liquid Administration via: Straw;Cup Medication Administration: Whole meds with liquid Supervision: Patient able to self feed;Intermittent supervision to cue for compensatory strategies Oral Care Recommendations: Oral care BID Patient destination: Home Follow up Recommendations: Home Health SLP;24 hour supervision/assistance Equipment Recommended: None recommended by SLP    SLP Frequency 3 to 5 out of 7 days   SLP Duration  SLP Intensity  SLP Treatment/Interventions 10-14 days  Minumum of 1-2 x/day, 30 to 90 minutes  Cognitive remediation/compensation;Cueing hierarchy;Functional tasks;Internal/external aids;Patient/family education;Speech/Language facilitation    Pain Pain Assessment Pain Score: 0-No pain  Prior Functioning Cognitive/Linguistic Baseline: Baseline deficits Baseline deficit details: memory Type of Home: House  Lives With: Alone Available Help at Discharge: Family Education: [employer]  Vocation: Retired  Industrial/product designer Term Goals: Week 1: SLP Short Term Goal 1 (Week 1): Pt will utilizie memory compensatory strategies to aid in recall of novel, daily information with mod A verbal cues. SLP Short Term Goal 2 (Week 1): Pt will complete functional basic problem solving tasks pertaining to ADLs with min A verbal cues. SLP Short Term Goal 3 (Week 1): Pt will demonstrate self-awareness and self-correction of functional errors in problem solving tasks with min A verbal cues. SLP Short Term Goal 4 (Week 1): Pt will demonstrate selective attention in midly distracting environment for 15 minute intervals with min A verbal cues  for redirection. SLP Short Term Goal 5 (Week 1): Pt will demonstrate orientation x4 with mod A verbal cues. SLP Short Term Goal 6 (Week 1): Pt will demonstrate use of word finding strategies in structured and unstructured mildly complex tasks with min A semantic cues.  Refer to Care Plan for Long Term Goals  Recommendations for other  services: None   Discharge Criteria: Patient will be discharged from SLP if patient refuses treatment 3 consecutive times without medical reason, if treatment goals not met, if there is a change in medical status, if patient makes no progress towards goals or if patient is discharged from hospital.  The above assessment, treatment plan, treatment alternatives and goals were discussed and mutually agreed upon: by patient  Carle Fenech  [facility] 03/13/2019, 1:19 PM

## 2019-03-13 NOTE — Patient Care Conference (Signed)
Inpatient RehabilitationTeam Conference and Plan of Care Update Date: 03/13/2019   Time: 11:15 AM    Patient Name: Beth Foster      Medical Record Number: [id number]  Date of Birth: 07/10/1928 Sex: Female         Room/Bed: 4W17C/4W17C-01 Payor Info: Payor: [employer] MEDICARE / Plan: [facility] MEDICARE / Product Type: *No Product type* /    Admit Date/Time:  03/12/2019  4:38 PM  Primary Diagnosis:  ICH (intracerebral hemorrhage) ([place])  Patient Active Problem List   Diagnosis Date Noted  . Hypoalbuminemia due to protein-calorie malnutrition ([place])   . Urinary retention   . Benign essential HTN   . Essential hypertension 03/12/2019  . Hyperlipidemia 03/12/2019  . UTI (urinary tract infection) 03/12/2019  . Enterococcus UTI   . Macular degeneration   . Cognitive deficit, post-stroke   . Pressure injury of skin 03/09/2019  . ICH (intracerebral hemorrhage) (HCC) w/ IVH, L cerebellar 03/08/2019  . Vascular dementia without behavioral disturbance ([place]) 02/28/2019  . Cerebrovascular disease 02/28/2019  . Visual field cut 02/28/2019  . Slow transit constipation 02/28/2019  . Rectal bleeding 02/28/2019  . Venous insufficiency 07/15/2016  . Visual hallucinations 07/15/2016  . OAB (overactive bladder) 07/15/2016  . Cellulitis 07/09/2016  . Cellulitis of right lower extremity 07/09/2016  . Neck pain 01/15/2016  . Left cervical lymphadenopathy 01/08/2016  . Cough 06/09/2015  . Dyspnea 06/09/2015  . Acute bronchitis 06/09/2015  . Allergic rhinitis 04/15/2015  . Syncope and collapse 01/10/2015  . Contusion of left supra orbit 01/10/2015  . Laceration of right eyebrow 01/10/2015  . DOE (dyspnea on exertion) 01/10/2015  . Exertional chest pain 01/10/2015  . Fatigue 01/10/2015  . Acute hyperglycemia 01/10/2015  . Hemorrhoids 10/28/2013  . Vitamin D deficiency 10/28/2013  . Painful lumpy right breast 12/06/2012  . HLD (hyperlipidemia) 12/06/2012  . Essential hypertension, benign  12/06/2012  . Malignant neoplasm of breast (female), unspecified site   . Mild cognitive impairment with memory loss 08/20/2012  . Gait disorder 08/20/2012  . Wound, open, head 08/20/2012  . GERD (gastroesophageal reflux disease) 08/20/2012    Expected Discharge Date: Expected Discharge Date: (TBD)  Team Members Present: Physician leading conference: Dr. Delice Lesch Social Worker Present: Ovidio Kin, LCSW Nurse Present: Mohammed Kindle, RN PT Present: Michaelene Song, PT OT Present: Clyda Greener, OT SLP Present: Jettie Booze, CF-SLP PPS Coordinator present : Gunnar Fusi, SLP     Current Status/Progress Goal Weekly Team Focus  Bowel/Bladder   continent of bowel. Pt has not voided since admission. order for bladder scans q4-6 and cath if grater than 350.urine cloudy with few sediment.  timed toileting while awake  assess bowel and bladder needs qshift and PRN   Swallow/Nutrition/ Hydration             ADL's   Supervision for UB bathing with mod assist for LB bathing.  Mod assist for UB and LB dressing as well as transfers stand pivot. She exhibits resting horizontal nystagmus as well as increased dizziness with positional changes.  supervision to min assist overall  selfcare retraining, transfer training, balance retraining, neuromuscular re-education, therapeutic strengthening, pt/family education   Mobility   eval pending  eval pending  eval pending   Communication   eval pending         Safety/Cognition/ Behavioral Observations  eval pending         Pain   no c/o pain  Pain less than 3  assess pain qshift and  PRN   Skin   No wounds. pt has bruises to coccyx arms, legs, and head r/t fall at home  No skin break down while admitted.  Assess skin qshift and PRN      *See Care Plan and progress notes for long and short-term goals.     Barriers to Discharge  Current Status/Progress Possible Resolutions Date Resolved   Nursing                  PT                    OT                   SLP                SW                Discharge Planning/Teaching Needs:  HOme with granddaughter along with granddaughter's husband. Will await evaluations today      Team Discussion: Monitoring HTN, has macular degeneration with visual disturbances, med started.  Bladder scan 999 cc, cathed for 2200 cc, urine thick, sediment, needs med for constipation.  Bruise on sacrum.  OT S UB bathing, Mod A UB dressing, sit to stand mod/min, dizzy, mod A transfers with S level goals. PT eval pending.  SLP S communication.    Revisions to treatment plan: N/A    Medical Summary Current Status: Balance deficits with left lean as well as cognitive deficits affecting ADLs and mobility secondary to  left cerebellar hematoma with penetration of 4th ventricle and dependent within occipital horns and lateral ventricles Weekly Focus/Goal: Improve mobility, urinary retention, bowel movements, HTN, macular degeneration  Barriers to Discharge: Behavior;Medical stability   Possible Resolutions to Barriers: Therapies, bowel/bladder meds, optimize BP, meds for macular degeneration   Continued Need for Acute Rehabilitation Level of Care: The patient requires daily medical management by a physician with specialized training in physical medicine and rehabilitation for the following reasons: Direction of a multidisciplinary physical rehabilitation program to maximize functional independence : Yes Medical management of patient stability for increased activity during participation in an intensive rehabilitation regime.: Yes Analysis of laboratory values and/or radiology reports with any subsequent need for medication adjustment and/or medical intervention. : Yes   I attest that I was present, lead the team conference, and concur with the assessment and plan of the team.   Beth Foster 03/13/2019, 8:22 PM  Team conference was held via web/ teleconference due to [place] - 19

## 2019-03-14 ENCOUNTER — Inpatient Hospital Stay (HOSPITAL_COMMUNITY): Payer: Medicare Other

## 2019-03-14 ENCOUNTER — Inpatient Hospital Stay (HOSPITAL_COMMUNITY): Payer: Medicare Other | Admitting: Speech Pathology

## 2019-03-14 ENCOUNTER — Inpatient Hospital Stay (HOSPITAL_COMMUNITY): Payer: Medicare Other | Admitting: Occupational Therapy

## 2019-03-14 MED ORDER — DILTIAZEM HCL ER COATED BEADS 180 MG PO CP24
180.0000 mg | ORAL_CAPSULE | Freq: Every day | ORAL | Status: DC
  Administered 2019-03-14 – 2019-04-03 (×21): 180 mg via ORAL
  Filled 2019-03-14 (×21): qty 1

## 2019-03-14 MED ORDER — WITCH HAZEL-GLYCERIN EX PADS: MEDICATED_PAD | CUTANEOUS | 12 refills | Status: DC | PRN

## 2019-03-14 MED ORDER — DILTIAZEM HCL ER COATED BEADS 180 MG PO CP24
180.0000 mg | ORAL_CAPSULE | Freq: Every day | ORAL | Status: DC
  Filled 2019-03-14: qty 1

## 2019-03-14 MED ORDER — SULFAMETHOXAZOLE-TRIMETHOPRIM 400-80 MG PO TABS
1.0000 | ORAL_TABLET | Freq: Two times a day (BID) | ORAL | Status: AC
  Administered 2019-03-15 – 2019-03-17 (×5): 1 via ORAL
  Filled 2019-03-14 (×7): qty 1

## 2019-03-14 MED ORDER — ESTRADIOL 0.1 MG/GM VA CREA
1.0000 | TOPICAL_CREAM | VAGINAL | Status: DC
  Administered 2019-03-15 – 2019-04-03 (×7): 1 via VAGINAL
  Filled 2019-03-14 (×3): qty 42.5

## 2019-03-14 NOTE — Plan of Care (Signed)
  Problem: Consults Goal: RH STROKE PATIENT EDUCATION Description: See Patient Education module for education specifics  Outcome: Progressing   Problem: RH BOWEL ELIMINATION Goal: RH STG MANAGE BOWEL WITH ASSISTANCE Description: STG Manage Bowel with min Assistance. Outcome: Progressing Goal: RH STG MANAGE BOWEL W/MEDICATION W/ASSISTANCE Description: STG Manage Bowel with Medication with min Assistance. Outcome: Progressing   Problem: RH BLADDER ELIMINATION Goal: RH STG MANAGE BLADDER WITH ASSISTANCE Description: STG Manage Bladder With min Assistance Outcome: Progressing Goal: RH STG MANAGE BLADDER WITH MEDICATION WITH ASSISTANCE Description: STG Manage Bladder With Medication With min Assistance. Outcome: Progressing   Problem: RH SKIN INTEGRITY Goal: RH STG MAINTAIN SKIN INTEGRITY WITH ASSISTANCE Description: STG Maintain Skin Integrity With mod I Assistance. Outcome: Progressing   Problem: RH SAFETY Goal: RH STG DECREASED RISK OF FALL WITH ASSISTANCE Description: STG Decreased Risk of Fall With cues and reminder Assistance. Outcome: Progressing   Problem: RH COGNITION-NURSING Goal: RH STG ANTICIPATES NEEDS/CALLS FOR ASSIST W/ASSIST/CUES Description: STG Anticipates Needs/Calls for Assist With cues and reminders Assistance/Cues. Outcome: Progressing   Problem: RH PAIN MANAGEMENT Goal: RH STG PAIN MANAGED AT OR BELOW PT'S PAIN GOAL Description: Pain level less than 3 on scale of 0-10 Outcome: Progressing   Problem: RH KNOWLEDGE DEFICIT Goal: RH STG INCREASE KNOWLEDGE OF HYPERTENSION Description: Pt will be able to adhere to medication regimen for blood pressure control with min assist from family. Pt will demonstrate safety precaution to take upon discharge to prevent falls with cues and reminders from family. Outcome: Progressing   Problem: RH Vision Goal: RH LTG Vision (Specify) Outcome: Progressing

## 2019-03-14 NOTE — Progress Notes (Signed)
During bladder scan, pt c/o of severe lower abd pain during palpation. Lower abd/upper pubic area very tender to touch. Pt c/o of 10/10 pain with palpation; bladder scan and in/out performed. Pt continues to c/o of pain, pt refused medication or heat therapy. Pt stated " please just get out of my room so I can rest. You have tortured me all day long". Pt was placed in comfortable position, bed lowest position, call light in reach. Will continue to monitor.

## 2019-03-14 NOTE — Progress Notes (Signed)
On call provider notified of MEWs score change due to VS. Algis Liming, PA gave verbal order for Chest Xray 2 view, UA/ Culture STAT, and tylenol. No other complaints from pt, pt sitting up in bed, no signs of distress or complaints at this time. VS recheck q2h w/ ordered bladder scan. Will continue to monitor.

## 2019-03-14 NOTE — Progress Notes (Signed)
Pt arrived back to unit from xray at 2145. No signs of distress at this time. Pt resting in bed, call light in reach. Will continue to monitor.

## 2019-03-14 NOTE — Discharge Instructions (Signed)
Inpatient Rehab Discharge Instructions  SERAI HOYLAND Discharge date and time:    Activities/Precautions/ Functional Status: Activity: no lifting, driving, or strenuous exercise for till cleared by MD Diet: cardiac diet Wound Care: none needed    Functional status:  ___ No restrictions     ___ Walk up steps independently ___ 24/7 supervision/assistance   ___ Walk up steps with assistance ___ Intermittent supervision/assistance  ___ Bathe/dress independently ___ Walk with walker     ___ Bathe/dress with assistance ___ Walk Independently    ___ Shower independently ___ Walk with assistance    ___ Shower with assistance ___ No alcohol     ___ Return to work/school ________  Special Instructions:    My questions have been answered and I understand these instructions. I will adhere to these goals and the provided educational materials after my discharge from the hospital.  Patient/Caregiver Signature _______________________________ Date __________  Clinician Signature _______________________________________ Date __________  Please bring this form and your medication list with you to all your follow-up doctor's appointments.

## 2019-03-14 NOTE — Progress Notes (Signed)
[place] PHYSICAL MEDICINE & REHABILITATION PROGRESS NOTE  Subjective/Complaints: Patient seen sitting up in bed this morning working with therapies.  She is states she slept well overnight, slept fairly well per sleep chart.  Fatigue noted by therapies and patient as well.  ROS: Denies CP, SOB, N/V/D  Objective: Vital Signs: Blood pressure 117/65, pulse (!) 58, temperature 97.9 F (36.6 C), resp. rate 18, height 5\' 5"  (1.651 m), weight 64.9 kg, SpO2 90 %. No results found. Recent Labs    03/12/19 0615 03/13/19 0527  WBC 5.9 4.8  HGB 13.7 13.6  HCT 42.8 41.4  PLT 170 181   Recent Labs    03/12/19 0615 03/13/19 0527  NA 139 139  K 3.7 3.9  CL 107 108  CO2 21* 21*  GLUCOSE 110* 109*  BUN 6* 10  CREATININE 0.61 0.62  CALCIUM 9.0 9.5    Physical Exam: BP 117/65 (BP Location: Right Arm)   Pulse (!) 58   Temp 97.9 F (36.6 C)   Resp 18   Ht 5\' 5"  (1.651 m)   Wt 64.9 kg   SpO2 90%   BMI 23.81 kg/m  Constitutional: No distress . Vital signs reviewed. HENT: Normocephalic.  Atraumatic. Eyes: EOMI. No discharge. Cardiovascular: No JVD. Respiratory: Normal effort.  No stridor. GI: Non-distended. Skin: Warm and dry.  Intact. Psych: Slowed. Musc: No edema in extremities.  No tenderness in extremities. Neurological:  Alert Motor:  Right upper extremity: 4/5 proximal to distal Left upper extremities: 4-4+/5 proximal distal Right lower extremity: 3+/5 proximal distal, unchanged Left lower extremity: 4 -/5 proximal to distal   Assessment/Plan: 1. Functional deficits secondary to left cerebellar hematoma which require 3+ hours per day of interdisciplinary therapy in a comprehensive inpatient rehab setting.  Physiatrist is providing close team supervision and 24 hour management of active medical problems listed below.  Physiatrist and rehab team continue to assess barriers to discharge/monitor patient progress toward functional and medical goals  Care  Tool:  Bathing    Body parts bathed by patient: Right arm, Left arm, Chest, Abdomen, Right upper leg, Left upper leg, Right lower leg, Left lower leg, Face   Body parts bathed by helper: Buttocks, Front perineal area     Bathing assist Assist Level: Moderate Assistance - Patient 50 - 74%     Upper Body Dressing/Undressing Upper body dressing   What is the patient wearing?: Bra, Button up shirt    Upper body assist Assist Level: Moderate Assistance - Patient 50 - 74%    Lower Body Dressing/Undressing Lower body dressing      What is the patient wearing?: Pants     Lower body assist Assist for lower body dressing: Moderate Assistance - Patient 50 - 74%     Toileting Toileting    Toileting assist Assist for toileting: Dependent - Patient 0%     Transfers Chair/bed transfer  Transfers assist     Chair/bed transfer assist level: Moderate Assistance - Patient 50 - 74%     Locomotion Ambulation   Ambulation assist      Assist level: Maximal Assistance - Patient 25 - 49% Assistive device: Hand held assist Max distance: 10   Walk 10 feet activity   Assist     Assist level: Maximal Assistance - Patient 25 - 49% Assistive device: No Device   Walk 50 feet activity   Assist           Walk 150 feet activity   Assist  Walk 10 feet on uneven surface  activity   Assist           Wheelchair     Assist   Type of Wheelchair: Manual    Wheelchair assist level: Minimal Assistance - Patient > 75% Max wheelchair distance: 25    Wheelchair 50 feet with 2 turns activity    Assist    Wheelchair 50 feet with 2 turns activity did not occur: Safety/medical concerns(fatigue)       Wheelchair 150 feet activity     Assist Wheelchair 150 feet activity did not occur: Safety/medical concerns(fatigue)          Medical Problem List and Plan: 1. Balance deficits with left lean as well as cognitive deficits affecting  ADLs and mobility secondary to  left cerebellar hematoma with penetration of 4th ventricle and dependent within occipital horns and lateral ventricles  Continue CIR 2.  Antithrombotics: -DVT/anticoagulation:  Pharmaceutical: Lovenox             -antiplatelet therapy: N/A 3. HA/back pain/Pain Management: Will continue tylenol prn  4. Mood: LCSW to follow for evaluation and support.              -antipsychotic agents: N/A 5. Neuropsych: This patient is not fully capable of making decisions on her own behalf. 6. Skin/Wound Care: Routine pressure relief measures.  Maintain adequate nutrition and hydration status. 7. Fluids/Electrolytes/Nutrition: Monitor intake and output.  Offer nutritional supplements as needed Po intake.   8.  HTN: Monitor   Cardizem daily, decreased to 180 on 10/22 (10/21 dose held overnight)  Started on 10/22  Monitor with increased mobility 9.  Macular degeneration: Visual hallucinations have been worsening.  Exacerbated by bilateral cataracts as well as elevated pressures left eye.    Started Xalatan (new RX per family)  23. Urinary retention:   Will consider Flomax if blood pressure improves 11. GERD: Continue Protonix. 12.  Hypoalbuminemia  Supplement initiated on 10/21 13. Constipation  Bowel meds increased on 10/21  Required disimpaction on 10/21, KUB ordered  LOS: 2 days A FACE TO FACE EVALUATION WAS PERFORMED  Lenia Housley Lorie Phenix 03/14/2019, 9:45 AM

## 2019-03-14 NOTE — Progress Notes (Signed)
Speech Language Pathology Daily Session Note  Patient Details  Name: Beth Foster MRN: [id number] Date of Birth: 02/07/1929  Today's Date: 03/14/2019 SLP Individual Time: [id number] SLP Individual Time Calculation (min): 61 min  Short Term Goals: Week 1: SLP Short Term Goal 1 (Week 1): Pt will utilizie memory compensatory strategies to aid in recall of novel, daily information with mod A verbal cues. SLP Short Term Goal 2 (Week 1): Pt will complete functional basic problem solving tasks pertaining to ADLs with min A verbal cues. SLP Short Term Goal 3 (Week 1): Pt will demonstrate self-awareness and self-correction of functional errors in problem solving tasks with min A verbal cues. SLP Short Term Goal 4 (Week 1): Pt will demonstrate selective attention in midly distracting environment for 15 minute intervals with min A verbal cues for redirection. SLP Short Term Goal 5 (Week 1): Pt will demonstrate orientation x4 with mod A verbal cues. SLP Short Term Goal 6 (Week 1): Pt will demonstrate use of word finding strategies in structured and unstructured mildly complex tasks with min A semantic cues.  Skilled Therapeutic Interventions: Pt was seen for skilled ST targeting cognitive goals. Pt was independently oriented to self and month, however required Min A question cues to orient to situation, Mod A verbal cues for orientation to place (she initially stated she thought she was on a train because she had been traveling a lot). SLP facilitated session with set up assist and intermittent assistance with self-feeding during breakfast meal. Pt required Mod A for basic problem solving and making choices regarding preferred items/how to prepare foods on tray. With Supervision A verbal cues, pt verbally sequenced steps of cleaning dentures following breakfast meal. Due to pt's visual impairments, SLP provided description of functional 3-step sequencing cards, which pt verbally sequenced with overall Min A from  clinician. Pt was left in bed with alarm set and all needs within reach. Continue per current plan of care.      Pain Pain Assessment Pain Scale: 0-10 Pain Score: 8  Pain Type: Acute pain Pain Location: Head Pain Orientation: Posterior Pain Descriptors / Indicators: Headache Pain Onset: On-going Patients Stated Pain Goal: 4 Pain Intervention(s): Medication (See eMAR);RN made aware Multiple Pain Sites: No  Therapy/Group: Individual Therapy  Arbutus Leas 03/14/2019, 9:40 AM

## 2019-03-14 NOTE — Progress Notes (Signed)
Physical Therapy Session Note  Patient Details  Name: Beth Foster MRN: [id number] Date of Birth: 07/18/1928  Today's Date: 03/14/2019 PT Individual Time: 1332-1440 PT Individual Time Calculation (min): 68 min   Short Term Goals: Week 1:  PT Short Term Goal 1 (Week 1): pt will move sit> supine with supervision PT Short Term Goal 2 (Week 1): pt will transfer bed>< w/c with min assist PT Short Term Goal 3 (Week 1): pt will propel w/c x 50' with min assist PT Short Term Goal 4 (Week 1): pt will perform gait with LRAD x 25' with mod assist  Skilled Therapeutic Interventions/Progress Updates:   Extra time to come to EOB with CGA. Total assist to don shoes for time management. Performed min/mod assist for stand step transfer to w/c with cues for hand placement and facilitation for weightshift during stepping. Functional transfer training with Rw throughout session including w/c <> mat and w/c <> toilet with overall min assist once up in standing with cues for positioning of RW safely and for balance due to posterior bias. Blocked practice sit <> stands with RW x 5 reps with focus on transitional movement and achieving balance once in standing position with RW for support - requires overall min assist and extra time with facilitation of anterior weightshift. Cues to "push feet down into floor" once in standing to activate planterflexors. Pt requires rest breaks between activities due to fatigue and reports mild dizziness that resolves with seated rest breaks. Functional gait training with focus on upright posture and BLE strengthening x 30' with cues for trunk extension and assist with RW management during turns requiring overall min to mod assist. End of session pt request to use bathroom . Performed min assist transfer to toilet with RW but mod assist coming off of toilet and mod assist to stand due to posterior bias. Requires total assist for hygiene but able to assist with clothing management (though  unable to fully complete without PT intervention and limited standing tolerance/balance). Transferred to recliner end of session with min assist and positioned with all needs in reach.   Pt requires extra time for all tasks due to fatigue and pt also does demonstrate intermittent confusion throughout session. Pt asking about hearing "kids playing" in the gym and then at end of session asked about someone else sleeping in her bed. Reoriented as able.   Therapy Documentation Precautions:  Precautions Precautions: Fall Restrictions Weight Bearing Restrictions: No   Pain:  Reports back pain - using muscle rub.    Therapy/Group: Individual Therapy  Canary Brim Ivory Broad, PT, DPT, CBIS  03/14/2019, 2:52 PM

## 2019-03-14 NOTE — Progress Notes (Signed)
Occupational Therapy Session Note  Patient Details  Name: ARLICIA PAQUETTE MRN: [id number] Date of Birth: [birth year]  Today's Date: 03/14/2019 OT Individual Time: 8381-8403 OT Individual Time Calculation (min): 70 min    Short Term Goals: Week 1:  OT Short Term Goal 1 (Week 1): Pt will complete UB dressing with min assist for bra and button up shirt. OT Short Term Goal 2 (Week 1): Pt will complete LB bathing sit to stand with min assist. OT Short Term Goal 3 (Week 1): Pt will complete LB dressing sit to stand with min assist. OT Short Term Goal 4 (Week 1): Pt will ambulate to the toilet with min assist from wheelchari or EOB with RW.  Skilled Therapeutic Interventions/Progress Updates:    Pt received in bed and discussed how upset she felt about some of the situations she experienced earlier.  Reassured pt and she was able to focus on therapy.  Pt was able to roll in bed with S not using the rails and then needed mod A to sit up from a flat bed.  She has impaired vision but could see a stain on her shirt and the style of this therapists hair.  Pt very polite and agreeable to all intervention, willing to try despite feeling tired. Overall she needs mod A with sit to stand, standing balance with RW, stand pivot transfers without AD, bathing, dressing due to postural limitations of fluctuating posterior lean, L lean.  She did respond well to cues to shift wt forward over her toes in standing or to engage her core in sitting.   Pt needed frequent rest breaks.  Pt resting in bed with all needs met.  Alarm set.   Therapy Documentation Precautions:  Precautions Precautions: Fall Restrictions Weight Bearing Restrictions: No  Pain: Pain Assessment Pain Scale: 0-10 Pain Score: 8  Pain Type: Acute pain Pain Location: Head Pain Orientation: Posterior Pain Descriptors / Indicators: Headache Pain Onset: On-going Pain Intervention(s): Medication (See eMAR);RN made aware Multiple Pain Sites:  No    Therapy/Group: Individual Therapy  [place] 03/14/2019, 12:38 PM

## 2019-03-15 ENCOUNTER — Inpatient Hospital Stay (HOSPITAL_COMMUNITY): Payer: Medicare Other | Admitting: Occupational Therapy

## 2019-03-15 ENCOUNTER — Inpatient Hospital Stay (HOSPITAL_COMMUNITY): Payer: Medicare Other | Admitting: Physical Therapy

## 2019-03-15 ENCOUNTER — Inpatient Hospital Stay (HOSPITAL_COMMUNITY): Payer: Medicare Other | Admitting: Speech Pathology

## 2019-03-15 DIAGNOSIS — I613 Nontraumatic intracerebral hemorrhage in brain stem: Secondary | ICD-10-CM

## 2019-03-15 DIAGNOSIS — I10 Essential (primary) hypertension: Secondary | ICD-10-CM | POA: Diagnosis not present

## 2019-03-15 DIAGNOSIS — R509 Fever, unspecified: Secondary | ICD-10-CM

## 2019-03-15 DIAGNOSIS — R0989 Other specified symptoms and signs involving the circulatory and respiratory systems: Secondary | ICD-10-CM | POA: Diagnosis present

## 2019-03-15 DIAGNOSIS — G479 Sleep disorder, unspecified: Secondary | ICD-10-CM | POA: Diagnosis present

## 2019-03-15 DIAGNOSIS — I69391 Dysphagia following cerebral infarction: Secondary | ICD-10-CM

## 2019-03-15 LAB — URINE CULTURE: Culture: <10000 — AB

## 2019-03-15 LAB — URINALYSIS, ROUTINE W REFLEX MICROSCOPIC
Bacteria, UA: NONE SEEN
Bilirubin Urine: NEGATIVE
Glucose, UA: NEGATIVE mg/dL
Hgb urine dipstick: NEGATIVE
Ketones, ur: 5 mg/dL — AB
Nitrite: NEGATIVE
Protein, ur: NEGATIVE mg/dL
Specific Gravity, Urine: 1.009 (ref 1.005–1.030)
pH: 7 (ref 5.0–8.0)

## 2019-03-15 LAB — CBC WITH DIFFERENTIAL/PLATELET
Abs Immature Granulocytes: 0.02 10*3/uL (ref 0.00–0.07)
Basophils Absolute: 0 10*3/uL (ref 0.0–0.1)
Basophils Relative: 1 %
Eosinophils Absolute: 0.1 10*3/uL (ref 0.0–0.5)
Eosinophils Relative: 2 %
HCT: 39.5 % (ref 36.0–46.0)
Hemoglobin: 12.8 g/dL (ref 12.0–15.0)
Immature Granulocytes: 0 %
Lymphocytes Relative: 33 %
Lymphs Abs: 1.6 10*3/uL (ref 0.7–4.0)
MCH: 32 pg (ref 26.0–34.0)
MCHC: 32.4 g/dL (ref 30.0–36.0)
MCV: 98.8 fL (ref 80.0–100.0)
Monocytes Absolute: 0.5 10*3/uL (ref 0.1–1.0)
Monocytes Relative: 11 %
Neutro Abs: 2.5 10*3/uL (ref 1.7–7.7)
Neutrophils Relative %: 53 %
Platelets: 186 10*3/uL (ref 150–400)
RBC: 4 MIL/uL (ref 3.87–5.11)
RDW: 13.1 % (ref 11.5–15.5)
WBC: 4.7 10*3/uL (ref 4.0–10.5)
nRBC: 0 % (ref 0.0–0.2)

## 2019-03-15 MED ORDER — TAMSULOSIN HCL 0.4 MG PO CAPS
0.4000 mg | ORAL_CAPSULE | Freq: Every day | ORAL | Status: DC
  Administered 2019-03-15 – 2019-03-17 (×3): 0.4 mg via ORAL
  Filled 2019-03-15 (×3): qty 1

## 2019-03-15 MED ORDER — CHLORHEXIDINE GLUCONATE CLOTH 2 % EX PADS
6.0000 | MEDICATED_PAD | Freq: Every day | CUTANEOUS | Status: DC
  Administered 2019-03-18: 6 via TOPICAL

## 2019-03-15 MED ORDER — ZOLPIDEM TARTRATE 5 MG PO TABS
2.5000 mg | ORAL_TABLET | Freq: Every day | ORAL | Status: DC
  Administered 2019-03-15 – 2019-03-17 (×2): 2.5 mg via ORAL
  Filled 2019-03-15 (×3): qty 1

## 2019-03-15 NOTE — Progress Notes (Signed)
Occupational Therapy Session Note  Patient Details  Name: Beth Foster MRN: [id number] Date of Birth: April 28, 1929  Today's Date: 03/15/2019 OT Individual Time: 1050-1150 OT Individual Time Calculation (min): 60 min  and Today's Date: 03/15/2019 OT Missed Time: 15 Minutes Missed Time Reason: Patient fatigue   Short Term Goals: Week 1:  OT Short Term Goal 1 (Week 1): Pt will complete UB dressing with min assist for bra and button up shirt. OT Short Term Goal 2 (Week 1): Pt will complete LB bathing sit to stand with min assist. OT Short Term Goal 3 (Week 1): Pt will complete LB dressing sit to stand with min assist. OT Short Term Goal 4 (Week 1): Pt will ambulate to the toilet with min assist from wheelchari or EOB with RW.  Skilled Therapeutic Interventions/Progress Updates:    Pt received in bed with granddaughter in the room. Pt initially saying she could not participate due to extreme fatigue but with coaxing agreed to try.  Pt sat to EOB with min A and in sitting improved postural control with no posterior lean. Attempted assessment of vision in more detail.  Unable to fully assess as pt's would partially close R eye but it appears visual acuity (pt has macular degeneration) is less in R eye than L.  She can track to the L but unable to the R. She can visually fixate when she turns her head to the L but not to the right.    From sitting, worked on active dorsi and plantar flexion.  Pt was able to sit to stand to RW with min A today and held standing position for several minutes working on lateral wt shifts.  Much improved postural control today. Pt ambulated with RW 10 ft from bed to door and back to bed with min A to support balance and guide RW as she has difficulty keeping walker in midline.  Cues to look forward and not down at her feet.    Pt requested to lay down.  From supine, worked on 10 hip bridges but then began dozing off. She fell asleep quickly. Bed alarm set.  Spent time  talking with her granddaughter about expected level of A needed at discharge.      Therapy Documentation Precautions:  Precautions Precautions: Fall Restrictions Weight Bearing Restrictions: No General: General OT Amount of Missed Time: 15 Minutes   Pain: Pain Assessment Pain Scale: 0-10 Pain Score: 0-No pain    Therapy/Group: Individual Therapy  [place] 03/15/2019, 12:24 PM

## 2019-03-15 NOTE — Progress Notes (Signed)
[place] PHYSICAL MEDICINE & REHABILITATION PROGRESS NOTE  Subjective/Complaints: Patient seen sitting up in bed this morning, working with therapies.  She states she slept well overnight, but she did not sleep well per sleep chart.  Discussed fatigue with therapies.  Patient with poor appetite as well.  ROS: Denies CP, SOB, N/V/D  Objective: Vital Signs: Blood pressure (!) 122/56, pulse 100, temperature 98.3 F (36.8 C), temperature source Oral, resp. rate 18, height 5\' 5"  (1.651 m), weight 64.9 kg, SpO2 91 %. Dg Chest 2 View  Result Date: 03/14/2019 CLINICAL DATA:  Fever. EXAM: CHEST - 2 VIEW COMPARISON:  06/11/2015 FINDINGS: The heart size and mediastinal contours are within normal limits. Both lungs are clear. The visualized skeletal structures are unremarkable. IMPRESSION: No active cardiopulmonary disease. Electronically Signed   By: Kerby Moors M.D.   On: 03/14/2019 21:51   Dg Abd 1 View  Result Date: 03/14/2019 CLINICAL DATA:  Lower abd pain. No bowel movement for over a week until today. Was disimpacted yesterday. Hx Has had fluid taken off of abd. EXAM: ABDOMEN - 1 VIEW COMPARISON:  None. FINDINGS: No dilated large or small bowel. Gas in the colon. Stool in the rectum. No pathologic calcifications other than vascular calcifications. Degenerate changes the spine. Postcholecystectomy. IMPRESSION: No bowel obstruction. Electronically Signed   By: Suzy Bouchard M.D.   On: 03/14/2019 15:32   Recent Labs    03/13/19 0527 03/15/19 0504  WBC 4.8 4.7  HGB 13.6 12.8  HCT 41.4 39.5  PLT 181 186   Recent Labs    03/13/19 0527  NA 139  K 3.9  CL 108  CO2 21*  GLUCOSE 109*  BUN 10  CREATININE 0.62  CALCIUM 9.5    Physical Exam: BP (!) 122/56 (BP Location: Left Arm)   Pulse 100   Temp 98.3 F (36.8 C) (Oral)   Resp 18   Ht 5\' 5"  (1.651 m)   Wt 64.9 kg   SpO2 91%   BMI 23.81 kg/m  Constitutional: No distress . Vital signs reviewed. HENT: Normocephalic.   Atraumatic. Eyes: EOMI. No discharge. Cardiovascular: No JVD. Respiratory: Normal effort.  No stridor. GI: Non-distended. Skin: Warm and dry.  Intact. Psych: Slowed. Musc: No edema in extremities.  No tenderness in extremities. Neurological:  Alert  Motor:  Right upper extremity: 4/5 proximal to distal, unchanged Left upper extremities: 4-4+/5 proximal distal, unchanged Right lower extremity: 3+/5 proximal distal, unchanged Left lower extremity: 4 -/5 proximal to distal  ?  Left upper extremity dysmetria versus visual deficits  Assessment/Plan: 1. Functional deficits secondary to left cerebellar hematoma which require 3+ hours per day of interdisciplinary therapy in a comprehensive inpatient rehab setting.  Physiatrist is providing close team supervision and 24 hour management of active medical problems listed below.  Physiatrist and rehab team continue to assess barriers to discharge/monitor patient progress toward functional and medical goals  Care Tool:  Bathing    Body parts bathed by patient: Right arm, Left arm, Chest, Abdomen, Right upper leg, Left upper leg, Right lower leg, Left lower leg, Face   Body parts bathed by helper: Buttocks, Front perineal area     Bathing assist Assist Level: Moderate Assistance - Patient 50 - 74%     Upper Body Dressing/Undressing Upper body dressing   What is the patient wearing?: Bra, Button up shirt    Upper body assist Assist Level: Moderate Assistance - Patient 50 - 74%    Lower Body Dressing/Undressing Lower body dressing  What is the patient wearing?: Pants, Underwear/pull up     Lower body assist Assist for lower body dressing: Moderate Assistance - Patient 50 - 74%     Toileting Toileting    Toileting assist Assist for toileting: Dependent - Patient 0%     Transfers Chair/bed transfer  Transfers assist     Chair/bed transfer assist level: Moderate Assistance - Patient 50 - 74%      Locomotion Ambulation   Ambulation assist      Assist level: Moderate Assistance - Patient 50 - 74% Assistive device: Walker-rolling Max distance: 30'   Walk 10 feet activity   Assist     Assist level: Minimal Assistance - Patient > 75% Assistive device: Walker-rolling   Walk 50 feet activity   Assist           Walk 150 feet activity   Assist           Walk 10 feet on uneven surface  activity   Assist           Wheelchair     Assist   Type of Wheelchair: Manual    Wheelchair assist level: Minimal Assistance - Patient > 75% Max wheelchair distance: 25    Wheelchair 50 feet with 2 turns activity    Assist    Wheelchair 50 feet with 2 turns activity did not occur: Safety/medical concerns(fatigue)       Wheelchair 150 feet activity     Assist Wheelchair 150 feet activity did not occur: Safety/medical concerns(fatigue)          Medical Problem List and Plan: 1. Balance deficits with left lean as well as cognitive deficits affecting ADLs and mobility secondary to  left cerebellar hematoma with penetration of 4th ventricle and dependent within occipital horns and lateral ventricles  Continue CIR 2.  Antithrombotics: -DVT/anticoagulation:  Pharmaceutical: Lovenox             -antiplatelet therapy: N/A 3. HA/back pain/Pain Management: Will continue tylenol prn  4. Mood: LCSW to follow for evaluation and support.              -antipsychotic agents: N/A 5. Neuropsych: This patient is not fully capable of making decisions on her own behalf. 6. Skin/Wound Care: Routine pressure relief measures.  Maintain adequate nutrition and hydration status. 7. Fluids/Electrolytes/Nutrition: Monitor intake and output.  Offer nutritional supplements as needed Po intake.   8.  HTN: Monitor   Cardizem daily, decreased to 180 on 10/22 (10/21 dose held overnight)  Slightly labile on 10/23  Monitor with increased mobility 9.  Macular  degeneration: Visual hallucinations have been worsening.  Exacerbated by bilateral cataracts as well as elevated pressures left eye.    Started Xalatan (new RX per family)  69. Urinary retention:   Flomax started on 10/23 11. GERD: Continue Protonix. 12.  Hypoalbuminemia  Supplement initiated on 10/21 13. Constipation  Bowel meds increased on 10/21  Required disimpaction on 10/21, KUB personally reviewed, relatively unremarkable 14.  Post stroke dysphagia  D3 thins, advance as tolerated 15. FUO  UA equivocal, Ucx pending  Chest x-ray personally reviewed, unremarkable for infection  Started on empiric Bactrim 16.  Sleep disturbance  Sleep chart  Ambien started 10/23  LOS: 3 days A FACE TO FACE EVALUATION WAS PERFORMED  Jama Krichbaum Lorie Phenix 03/15/2019, 11:05 AM

## 2019-03-15 NOTE — Progress Notes (Signed)
Physical Therapy Session Note  Patient Details  Name: Beth Foster MRN: [id number] Date of Birth: 09-12-1928  Today's Date: 03/15/2019 PT Individual Time: 0803-0904 PT Individual Time Calculation (min): 61 min  and Today's Date: 03/15/2019 PT Missed Time: 14 Minutes Missed Time Reason: Patient fatigue  Short Term Goals: Week 1:  PT Short Term Goal 1 (Week 1): pt will move sit> supine with supervision PT Short Term Goal 2 (Week 1): pt will transfer bed>< w/c with min assist PT Short Term Goal 3 (Week 1): pt will propel w/c x 50' with min assist PT Short Term Goal 4 (Week 1): pt will perform gait with LRAD x 25' with mod assist  Skilled Therapeutic Interventions/Progress Updates:    Pt received supine in bed with HOB maximally elevated with her breakfast sitting in front of her but it appears pt has not eaten - therapist encouraged her to eat and she took ~6 spoonfuls of yogurt and then stated she didn't want to eat any more as she was not hungry. Supine>sitting EOB, HOB maximally elevated and using bedrails, with mod assist for rotating hips to EOB. MD present for morning assessment while pt able to maintain sitting balance EOB with UE support and close supervision. Stand pivot EOB>w/c, no AD, with mod assist for lifting into standing and pivoting hips with max cuing for sequencing of LE stepping. Pt requesting to use bathroom.  Transported in/out of bathroom in w/c. Stand pivot w/c<>BSC over toilet using grab bars with min/mod assist for balance and pivoting hips with again continued cuing for sequencing of LE stepping. While sitting donned brief and pants with max assist for time management. Pt unable to urinate at this time. Standing with UE support on grab bars and min/mod assist for balance therapist completed pulling up LB clothing over pt's hips with max assist. Transferred back to w/c. Donned bra and shirt sitting in w/c with max assist for clasping bra and mod assist for shirt for time  management. Assessed vitals as pt had reported some lightheadedness: BP 127/72 (MAP 87), HR 101bpm. Transported to/from gym in w/c. Ambulated ~15ft x 2 (seated break between) using RW with initially mod assist progressed to min assist for 2nd walk - demonstrates significant thoracic kyphosis with downward gaze, decreased B LE step length, decreased gait speed - at end of 1st walk when turning to sit in chair pt reports significant dizziness vitals assessed: BP 135/86 (MAP 99), HR 104bpm, SpO2 94%. Upon return to room pt's granddaughter present and updated on pt's CLOF. Pt left seated in w/c with needs in reach and seat belt alarm on.   Therapy Documentation Precautions:  Precautions Precautions: Fall Restrictions Weight Bearing Restrictions: No  Pain:   No complaints of pain during session.   Therapy/Group: Individual Therapy  Tawana Scale, PT, DPT 03/15/2019, 7:52 AM

## 2019-03-15 NOTE — Plan of Care (Signed)
  Problem: Consults Goal: RH STROKE PATIENT EDUCATION Description: See Patient Education module for education specifics  Outcome: Progressing   Problem: RH BOWEL ELIMINATION Goal: RH STG MANAGE BOWEL WITH ASSISTANCE Description: STG Manage Bowel with min Assistance. Outcome: Progressing Goal: RH STG MANAGE BOWEL W/MEDICATION W/ASSISTANCE Description: STG Manage Bowel with Medication with min Assistance. Outcome: Progressing   Problem: RH BLADDER ELIMINATION Goal: RH STG MANAGE BLADDER WITH ASSISTANCE Description: STG Manage Bladder With min Assistance Outcome: Progressing Goal: RH STG MANAGE BLADDER WITH MEDICATION WITH ASSISTANCE Description: STG Manage Bladder With Medication With min Assistance. Outcome: Progressing   Problem: RH SKIN INTEGRITY Goal: RH STG MAINTAIN SKIN INTEGRITY WITH ASSISTANCE Description: STG Maintain Skin Integrity With mod I Assistance. Outcome: Progressing   Problem: RH SAFETY Goal: RH STG DECREASED RISK OF FALL WITH ASSISTANCE Description: STG Decreased Risk of Fall With cues and reminder Assistance. Outcome: Progressing   Problem: RH COGNITION-NURSING Goal: RH STG ANTICIPATES NEEDS/CALLS FOR ASSIST W/ASSIST/CUES Description: STG Anticipates Needs/Calls for Assist With cues and reminders Assistance/Cues. Outcome: Progressing   Problem: RH PAIN MANAGEMENT Goal: RH STG PAIN MANAGED AT OR BELOW PT'S PAIN GOAL Description: Pain level less than 3 on scale of 0-10 Outcome: Progressing   Problem: RH KNOWLEDGE DEFICIT Goal: RH STG INCREASE KNOWLEDGE OF HYPERTENSION Description: Pt will be able to adhere to medication regimen for blood pressure control with min assist from family. Pt will demonstrate safety precaution to take upon discharge to prevent falls with cues and reminders from family. Outcome: Progressing   Problem: RH Vision Goal: RH LTG Vision (Specify) Outcome: Progressing

## 2019-03-15 NOTE — Progress Notes (Signed)
Speech Language Pathology Daily Session Note  Patient Details  Name: Beth Foster MRN: [id number] Date of Birth: September 19, 1928  Today's Date: 03/15/2019 SLP Individual Time: 1415-1453 SLP Individual Time Calculation (min): 38 min  Short Term Goals: Week 1: SLP Short Term Goal 1 (Week 1): Pt will utilizie memory compensatory strategies to aid in recall of novel, daily information with mod A verbal cues. SLP Short Term Goal 2 (Week 1): Pt will complete functional basic problem solving tasks pertaining to ADLs with min A verbal cues. SLP Short Term Goal 3 (Week 1): Pt will demonstrate self-awareness and self-correction of functional errors in problem solving tasks with min A verbal cues. SLP Short Term Goal 4 (Week 1): Pt will demonstrate selective attention in midly distracting environment for 15 minute intervals with min A verbal cues for redirection. SLP Short Term Goal 5 (Week 1): Pt will demonstrate orientation x4 with mod A verbal cues. SLP Short Term Goal 6 (Week 1): Pt will demonstrate use of word finding strategies in structured and unstructured mildly complex tasks with min A semantic cues.  Skilled Therapeutic Interventions: Pt was seen for skilled ST targeting cognitive-linguistic goals. Pt was independently oriented X4 and able to recall general events (exercise) of the day with Min A questions cues. Pt required Max A verbal cues to recall granddaughter's visit earlier this morning, multiple choice cues provided to assist with identifying 2 activities completed in PT/OT (sitting edge of bed, walking with walker). SLP further facilitated session with Mod A semantic cues during a divergent naming task, during which pt named 11 animals during ~10 minute span of time. Decreased attention and memory believed to strongly impact pt's performance on this naming task. Min A verbal cues required for redirection to tasks throughout session, Mod A for recall of instructions. SLP also began teaching pt  various compensatory strategies for word finding, via explanation and demonstration. Prior to end of session, pt exhibited good safety awareness, evidenced by he statement "I can't get out of bed to go to the bathroom by myself." However, she was unable to recall how to use call bell to request assistant. Teach back method provided for use of call bell. Pt left in bed with alarm set and all needs met. Continue per current plan of care.      Pain Pain Assessment Pain Scale: Faces Faces Pain Scale: Hurts a little bit Pain Type: Acute pain Pain Location: Abdomen Pain Orientation: Mid Pain Descriptors / Indicators: Discomfort Pain Onset: Gradual Pain Intervention(s): Distraction;Repositioned Multiple Pain Sites: No  Therapy/Group: Individual Therapy  Arbutus Leas 03/15/2019, 2:57 PM

## 2019-03-16 ENCOUNTER — Inpatient Hospital Stay (HOSPITAL_COMMUNITY): Payer: Medicare Other | Admitting: Physical Therapy

## 2019-03-16 ENCOUNTER — Inpatient Hospital Stay (HOSPITAL_COMMUNITY): Payer: Medicare Other

## 2019-03-16 ENCOUNTER — Inpatient Hospital Stay (HOSPITAL_COMMUNITY): Payer: Medicare Other | Admitting: Occupational Therapy

## 2019-03-16 DIAGNOSIS — I10 Essential (primary) hypertension: Secondary | ICD-10-CM | POA: Diagnosis not present

## 2019-03-16 NOTE — Progress Notes (Signed)
Occupational Therapy Session Note  Patient Details  Name: Beth Foster MRN: [id number] Date of Birth: November 08, 1928  Today's Date: 03/16/2019 OT Individual Time: [id number] OT Individual Time Calculation (min): 52 min  23 minutes missed  Short Term Goals: Week 1:  OT Short Term Goal 1 (Week 1): Pt will complete UB dressing with min assist for bra and button up shirt. OT Short Term Goal 2 (Week 1): Pt will complete LB bathing sit to stand with min assist. OT Short Term Goal 3 (Week 1): Pt will complete LB dressing sit to stand with min assist. OT Short Term Goal 4 (Week 1): Pt will ambulate to the toilet with min assist from wheelchari or EOB with RW.  Skilled Therapeutic Interventions/Progress Updates:    Pt greeted in bed with no c/o pain initially. She did not want to participate in any self care tasks or therapeutic activities due to fatigue. With encouragement, she was willing to get OOB to go outdoors. Mod A for supine<sit and for stand pivot<w/c using the RW. Pt was agreeable to attempt toileting first. Stand pivot<BSC over toilet completed using the grab bar with Mod A once again. Pt was actively voiding bowels during clothing mgt and got her LB garments soiled. Pt continued to have BM of watery consistency. She stated she was finished voiding twice but then proceeded to go after hygiene was finished. OT completed perihygiene with pt standing multiple times using RW for support. Worked on midline orientation in sitting and standing, providing manual and verbal cuing to correct Lt lean. Max A for washing and dressing LB. After transferring back to the w/c, she completed handwashing while sitting at the sink with Min A. She then returned to bed due to exhaustion and c/o HA, refusing any other activity. Also refusing medicinal interventions to address HA, though informed pt that we could notify her RN. Pt was repositioned in sidelying for comfort and left with all needs within reach and bed alarm  set. Time missed due to fatigue.   Therapy Documentation Precautions:  Precautions Precautions: Fall Restrictions Weight Bearing Restrictions: No Vital Signs: Therapy Vitals Temp: 97.7 F (36.5 C) Temp Source: Oral Pulse Rate: 89 Resp: 16 BP: (!) 113/53 Patient Position (if appropriate): Lying Oxygen Therapy SpO2: 94 % O2 Device: Room Air ADL: ADL Eating: Set up Where Assessed-Eating: Edge of bed Grooming: Setup Where Assessed-Grooming: Wheelchair Upper Body Bathing: Supervision/safety Where Assessed-Upper Body Bathing: Wheelchair Lower Body Bathing: Moderate assistance Where Assessed-Lower Body Bathing: Wheelchair Upper Body Dressing: Moderate assistance Where Assessed-Upper Body Dressing: Wheelchair Lower Body Dressing: Moderate assistance Where Assessed-Lower Body Dressing: Wheelchair Toileting: Moderate assistance Where Assessed-Toileting: Bedside Commode Toilet Transfer: Moderate assistance Toilet Transfer Method: Stand pivot Toilet Transfer Equipment: Bedside commode      Therapy/Group: Individual Therapy  Donathan Buller A Laetitia Schnepf 03/16/2019, 3:41 PM

## 2019-03-16 NOTE — Progress Notes (Signed)
Physical Therapy Session Note  Patient Details  Name: Beth Foster MRN: [id number] Date of Birth: [birth year]  Today's Date: 03/16/2019 PT Individual Time: 0900-1000 PT Individual Time Calculation (min): 60 min   Short Term Goals: Week 1:  PT Short Term Goal 1 (Week 1): pt will move sit> supine with supervision PT Short Term Goal 2 (Week 1): pt will transfer bed>< w/c with min assist PT Short Term Goal 3 (Week 1): pt will propel w/c x 50' with min assist PT Short Term Goal 4 (Week 1): pt will perform gait with LRAD x 25' with mod assist  Skilled Therapeutic Interventions/Progress Updates:  Pt received in bed upon arrival for therapy. Pt agreeable to treatment. Pt transferred from supine>sitting EOB with minA. SPT provided mod-maxA for dressing including pants, bra, and top. Pt completed sit>stand to get pants fully on requiring modA and RW. Pt needed rest break d/t fatigue. Pt expressed feeling dizzy. SPT educated pt that dizziness is a symptom she will experience after having the type of stroke she had (I.e. cerebellar stroke). Then pt completed another sit>stand and stand pivot transfer from bed>wheelchair with modA and RW. Pt transported to the therapy gym. Pt positioned in front of mirror and completed sit>standx2. Pt required verbal and tactile cueing for weightshift fwd d/t posterior lean in addition to weightshift to the R d/t excessive L lean. Then, pt completed sit to stand with modA and RW and completed reaching task consisting of reaching R for a certain colored bean bag and handing it to the therapist on pt's L side. (1st trial: 6 bean bags, 2nd trial: 9 bean bags) Pt able to complete reaching task twice requiring seated rest break in between d/t fatigue. Pt reported having an accident in her brief. Pt taken back to her room in wheelchair. Pt transferred with modA and RW from wheelchair>sitting EOB. Then pt transferred EOB>supine with modA. Pt positioned in bed, call bell within reach, and  bed exit on. All needs met at this time.     Therapy Documentation Precautions:  Precautions Precautions: Fall Restrictions Weight Bearing Restrictions: No  Pain: Denies pain at rest   Therapy/Group: Individual Therapy  Olena Leatherwood, SPT  03/16/2019, 5:13 PM

## 2019-03-16 NOTE — Progress Notes (Signed)
[place] PHYSICAL MEDICINE & REHABILITATION PROGRESS NOTE  Subjective/Complaints: Lying in bed. Denies any problems. Had a good night sleep.   ROS: Patient denies fever, rash, sore throat, blurred vision, nausea, vomiting, diarrhea, cough, shortness of breath or chest pain, joint or back pain, headache, or mood change.   Objective: Vital Signs: Blood pressure (!) 113/58, pulse 89, temperature 98.3 F (36.8 C), temperature source Oral, resp. rate 18, height 5\' 5"  (1.651 m), weight 64.9 kg, SpO2 92 %. Dg Chest 2 View  Result Date: 03/14/2019 CLINICAL DATA:  Fever. EXAM: CHEST - 2 VIEW COMPARISON:  06/11/2015 FINDINGS: The heart size and mediastinal contours are within normal limits. Both lungs are clear. The visualized skeletal structures are unremarkable. IMPRESSION: No active cardiopulmonary disease. Electronically Signed   By: Kerby Moors M.D.   On: 03/14/2019 21:51   Dg Abd 1 View  Result Date: 03/14/2019 CLINICAL DATA:  Lower abd pain. No bowel movement for over a week until today. Was disimpacted yesterday. Hx Has had fluid taken off of abd. EXAM: ABDOMEN - 1 VIEW COMPARISON:  None. FINDINGS: No dilated large or small bowel. Gas in the colon. Stool in the rectum. No pathologic calcifications other than vascular calcifications. Degenerate changes the spine. Postcholecystectomy. IMPRESSION: No bowel obstruction. Electronically Signed   By: Suzy Bouchard M.D.   On: 03/14/2019 15:32   Recent Labs    03/15/19 0504  WBC 4.7  HGB 12.8  HCT 39.5  PLT 186   No results for input(s): NA, K, CL, CO2, GLUCOSE, BUN, CREATININE, CALCIUM in the last 72 hours.  Physical Exam: BP (!) 113/58 (BP Location: Right Arm)   Pulse 89   Temp 98.3 F (36.8 C) (Oral)   Resp 18   Ht 5\' 5"  (1.651 m)   Wt 64.9 kg   SpO2 92%   BMI 23.81 kg/m  Constitutional: No distress . Vital signs reviewed. HEENT: EOMI, oral membranes moist Neck: supple Cardiovascular: RRR without murmur. No JVD     Respiratory: CTA Bilaterally without wheezes or rales. Normal effort    GI: BS +, non-tender, non-distended  Skin: Warm and dry.  Intact. Psych: Slowed. Musc: No edema in extremities.  No tenderness in extremities. Neurological:  Alert, normal language, follows commands.  Motor:  Right upper extremity: 4/5 proximal to distal, unchanged Left upper extremities: 4-4+/5 proximal distal, unchanged Right lower extremity: 3+/5 proximal distal, unchanged---clonus right ankle Left lower extremity: 4 -/5 proximal to distal  ?  Left upper extremity dysmetria versus visual deficits  Assessment/Plan: 1. Functional deficits secondary to left cerebellar hematoma which require 3+ hours per day of interdisciplinary therapy in a comprehensive inpatient rehab setting.  Physiatrist is providing close team supervision and 24 hour management of active medical problems listed below.  Physiatrist and rehab team continue to assess barriers to discharge/monitor patient progress toward functional and medical goals  Care Tool:  Bathing    Body parts bathed by patient: Right arm, Left arm, Chest, Abdomen, Right upper leg, Left upper leg, Right lower leg, Left lower leg, Face   Body parts bathed by helper: Buttocks, Front perineal area     Bathing assist Assist Level: Moderate Assistance - Patient 50 - 74%     Upper Body Dressing/Undressing Upper body dressing   What is the patient wearing?: Bra, Button up shirt    Upper body assist Assist Level: Moderate Assistance - Patient 50 - 74%    Lower Body Dressing/Undressing Lower body dressing  What is the patient wearing?: Pants, Underwear/pull up     Lower body assist Assist for lower body dressing: Moderate Assistance - Patient 50 - 74%     Toileting Toileting    Toileting assist Assist for toileting: Dependent - Patient 0%     Transfers Chair/bed transfer  Transfers assist     Chair/bed transfer assist level: Moderate Assistance -  Patient 50 - 74%     Locomotion Ambulation   Ambulation assist      Assist level: Moderate Assistance - Patient 50 - 74% Assistive device: Walker-rolling Max distance: 30'   Walk 10 feet activity   Assist     Assist level: Minimal Assistance - Patient > 75% Assistive device: Walker-rolling   Walk 50 feet activity   Assist Walk 50 feet with 2 turns activity did not occur: Safety/medical concerns(Per report )         Walk 150 feet activity   Assist Walk 150 feet activity did not occur: Safety/medical concerns(Per report )         Walk 10 feet on uneven surface  activity   Assist Walk 10 feet on uneven surfaces activity did not occur: Safety/medical concerns(Per report)         Wheelchair     Assist   Type of Wheelchair: Manual    Wheelchair assist level: Minimal Assistance - Patient > 75% Max wheelchair distance: 25    Wheelchair 50 feet with 2 turns activity    Assist    Wheelchair 50 feet with 2 turns activity did not occur: Safety/medical concerns(fatigue)       Wheelchair 150 feet activity     Assist Wheelchair 150 feet activity did not occur: Safety/medical concerns(fatigue)          Medical Problem List and Plan: 1. Balance deficits with left lean as well as cognitive deficits affecting ADLs and mobility secondary to  left cerebellar hematoma with penetration of 4th ventricle and dependent within occipital horns and lateral ventricles  Continue CIR 2.  Antithrombotics: -DVT/anticoagulation:  Pharmaceutical: Lovenox             -antiplatelet therapy: N/A 3. HA/back pain/Pain Management: Will continue tylenol prn  4. Mood: LCSW to follow for evaluation and support.              -antipsychotic agents: N/A 5. Neuropsych: This patient is not fully capable of making decisions on her own behalf. 6. Skin/Wound Care: Routine pressure relief measures.  Maintain adequate nutrition and hydration status. 7.  Fluids/Electrolytes/Nutrition: Monitor intake and output.  Offer nutritional supplements as needed Po intake.   8.  HTN: Monitor   Cardizem daily, decreased to 180 on 10/22 (10/21 dose held overnight)  Fair control 10/24    9.  Macular degeneration: Visual hallucinations have been worsening.  Exacerbated by bilateral cataracts as well as elevated pressures left eye.    Started Xalatan (new RX per family)  30. Urinary retention:   -has foley  Flomax started on 10/23 11. GERD: Continue Protonix. 12.  Hypoalbuminemia  Supplement initiated on 10/21 13. Constipation  Bowel meds increased on 10/21  Required disimpaction on 10/21  -moved bowels this am 10/24 14.  Post stroke dysphagia  D3 thins, advance as tolerated 15. FUO  UA equivocal, Ucx <10k insig growth, wbc's 4.7, afebrile since 10/22  Chest x-ray personally reviewed, unremarkable for infection  Continue bactrim for 3 days then stop 16.  Sleep disturbance  Sleep chart  Ambien started 10/23  LOS:  4 days A FACE TO FACE EVALUATION WAS PERFORMED  Meredith Staggers 03/16/2019, 9:57 AM

## 2019-03-16 NOTE — Progress Notes (Signed)
Speech Language Pathology Daily Session Note  Patient Details  Name: Beth Foster MRN: [id number] Date of Birth: 27-Jan-1929  Today's Date: 03/16/2019 SLP Individual Time: [id number] SLP Individual Time Calculation (min): 42 min  Short Term Goals: Week 1: SLP Short Term Goal 1 (Week 1): Pt will utilizie memory compensatory strategies to aid in recall of novel, daily information with mod A verbal cues. SLP Short Term Goal 2 (Week 1): Pt will complete functional basic problem solving tasks pertaining to ADLs with min A verbal cues. SLP Short Term Goal 3 (Week 1): Pt will demonstrate self-awareness and self-correction of functional errors in problem solving tasks with min A verbal cues. SLP Short Term Goal 4 (Week 1): Pt will demonstrate selective attention in midly distracting environment for 15 minute intervals with min A verbal cues for redirection. SLP Short Term Goal 5 (Week 1): Pt will demonstrate orientation x4 with mod A verbal cues. SLP Short Term Goal 6 (Week 1): Pt will demonstrate use of word finding strategies in structured and unstructured mildly complex tasks with min A semantic cues.  Skilled Therapeutic Interventions: Skilled ST services focused on cognitive skills. Pt expressed not feeling well, Nurse present in room and confirmed recent loose stool. Pt requested to stay in bed, but participated in performance of ADLs, removing dentures and washing face requiring set up and assistance locating items due to visual impairment. SLP facilitated safety awareness and problem solving skills given verbal scenarios and sequencing task performed at home ( ex: microwaving food and showering), pt required mod-min A verbal cues. Pt did express unsafe behaviors at home, such as microwaving foil and items for 30 minutes piror to hospitalization, suggesting baseline cognitive deficits. SLP provided education of word finding strategies, likely due to cognitive deficits in memory/attention, and pt stated  understanding. Pt was orientated to situation and place with cues required of time.  Pt demonstrated ability to problem solve with call bell with min A verbal cues in order to locate remote and press button with force. Pt was left in room with call bell within reach and bed alarm set. ST recommends to continue skilled ST services.      Pain Pain Assessment Pain Scale: 0-10 Pain Score: 0-No pain  Therapy/Group: Individual Therapy  Yeila Morro  [facility] 03/16/2019, 11:15 AM

## 2019-03-17 ENCOUNTER — Inpatient Hospital Stay (HOSPITAL_COMMUNITY): Payer: Medicare Other

## 2019-03-17 DIAGNOSIS — I610 Nontraumatic intracerebral hemorrhage in hemisphere, subcortical: Secondary | ICD-10-CM | POA: Diagnosis not present

## 2019-03-17 NOTE — Progress Notes (Signed)
Physical Therapy Session Note  Patient Details  Name: Beth Foster MRN: [id number] Date of Birth: 1929-01-30  Today's Date: 03/17/2019 PT Individual Time: 1306-1402 PT Individual Time Calculation (min): 56 min   Short Term Goals: Week 1:  PT Short Term Goal 1 (Week 1): pt will move sit> supine with supervision PT Short Term Goal 2 (Week 1): pt will transfer bed>< w/c with min assist PT Short Term Goal 3 (Week 1): pt will propel w/c x 50' with min assist PT Short Term Goal 4 (Week 1): pt will perform gait with LRAD x 25' with mod assist  Skilled Therapeutic Interventions/Progress Updates:    Pt supine in bed upon PT arrival, agreeable to therapy tx and denies pain. Pt transferred to sitting EOB with min assist and cues for techniques/hand placement. Pt scooted to EOB with cues for techniques. Pt performed stand pivot to w/c with RW and min assist. Therapist donned shoes and socks total assist for time management. Pt transported to the gym. Pt ambulated x 38 ft and x 30 ft with min assist, cues for upright posture, cues for step length/foot clearance and assist with steering walking during turns. Pt ascended/descended 4 steps this session with B rails and min assist, step to pattern with cues for foot placement and techniques. Pt performed stand pivot to the mat with min assist and RW. From the mat pt performed x 5 sit<>stands this session without AD, pushing up from mat with UEs, upon standing focus on full hip extension and minimizing posterior lean. Pt worked on standing balance without UE support this session while tossing horseshoes x 2 trials with min assist and cues/facilitation for correction of posterior lean. During therapeutic seated rest breaks this session pt worked on memory in order to answer questions about her family. Pt transferred back to w/c with min assist and transported to room. Pt ambulated from hallway>recliner x 20 ft with min assist and RW, increased cues for RW management,  hand placement and step length. Discussed session with pt's granddaughter Beth Foster), pt left in recliner with needs in reach and chair alarm set.   Therapy Documentation Precautions:  Precautions Precautions: Fall Restrictions Weight Bearing Restrictions: No   Therapy/Group: Individual Therapy  Netta Corrigan, PT, DPT, CSRS 03/17/2019, 9:00 AM

## 2019-03-17 NOTE — Progress Notes (Signed)
[place] PHYSICAL MEDICINE & REHABILITATION PROGRESS NOTE  Subjective/Complaints: Feels a little weak this morning. Slept ok. Doesn't have much appetite  ROS: Patient denies fever, rash, sore throat, blurred vision, nausea, vomiting, diarrhea, cough, shortness of breath or chest pain, joint or back pain, headache, or mood change.    Objective: Vital Signs: Blood pressure 106/65, pulse 85, temperature 98 F (36.7 C), resp. rate 18, height 5\' 5"  (1.651 m), weight 64.9 kg, SpO2 94 %. No results found. Recent Labs    03/15/19 0504  WBC 4.7  HGB 12.8  HCT 39.5  PLT 186   No results for input(s): NA, K, CL, CO2, GLUCOSE, BUN, CREATININE, CALCIUM in the last 72 hours.  Physical Exam: BP 106/65 (BP Location: Left Arm)   Pulse 85   Temp 98 F (36.7 C)   Resp 18   Ht 5\' 5"  (1.651 m)   Wt 64.9 kg   SpO2 94%   BMI 23.81 kg/m  Constitutional: No distress . Vital signs reviewed. HEENT: EOMI, oral membranes moist Neck: supple Cardiovascular: RRR without murmur. No JVD    Respiratory: CTA Bilaterally without wheezes or rales. Normal effort    GI: BS +, non-tender, non-distended   Skin: Warm and dry.  Intact. Psych: pleasant Musc: No edema in extremities.  No tenderness in extremities. Neurological:  Alert, normal language, follows commands.  Motor:  Right upper extremity: 4/5 proximal to distal, unchanged Left upper extremities: 4-4+/5 proximal distal, unchanged Right lower extremity: 3+/5 proximal distal, unchanged---3 beat clonus right ankle still present Left lower extremity: 4 -/5 proximal to distal  ?  Left upper extremity with [facility] deficits  Assessment/Plan: 1. Functional deficits secondary to left cerebellar hematoma which require 3+ hours per day of interdisciplinary therapy in a comprehensive inpatient rehab setting.  Physiatrist is providing close team supervision and 24 hour management of active medical problems listed below.  Physiatrist and rehab team continue  to assess barriers to discharge/monitor patient progress toward functional and medical goals  Care Tool:  Bathing    Body parts bathed by patient: Right arm, Left arm, Chest, Abdomen, Right upper leg, Left upper leg, Right lower leg, Left lower leg, Face   Body parts bathed by helper: Buttocks, Front perineal area     Bathing assist Assist Level: Moderate Assistance - Patient 50 - 74%     Upper Body Dressing/Undressing Upper body dressing   What is the patient wearing?: Bra, Button up shirt    Upper body assist Assist Level: Moderate Assistance - Patient 50 - 74%    Lower Body Dressing/Undressing Lower body dressing      What is the patient wearing?: Pants, Underwear/pull up     Lower body assist Assist for lower body dressing: Moderate Assistance - Patient 50 - 74%     Toileting Toileting    Toileting assist Assist for toileting: Maximal Assistance - Patient 25 - 49%     Transfers Chair/bed transfer  Transfers assist     Chair/bed transfer assist level: Moderate Assistance - Patient 50 - 74%     Locomotion Ambulation   Ambulation assist      Assist level: Moderate Assistance - Patient 50 - 74% Assistive device: Walker-rolling Max distance: 30'   Walk 10 feet activity   Assist     Assist level: Minimal Assistance - Patient > 75% Assistive device: Walker-rolling   Walk 50 feet activity   Assist Walk 50 feet with 2 turns activity did not occur: Safety/medical concerns(Per report )  Walk 150 feet activity   Assist Walk 150 feet activity did not occur: Safety/medical concerns(Per report )         Walk 10 feet on uneven surface  activity   Assist Walk 10 feet on uneven surfaces activity did not occur: Safety/medical concerns(Per report)         Wheelchair     Assist   Type of Wheelchair: Manual    Wheelchair assist level: Minimal Assistance - Patient > 75% Max wheelchair distance: 25    Wheelchair 50 feet  with 2 turns activity    Assist    Wheelchair 50 feet with 2 turns activity did not occur: Safety/medical concerns(fatigue)       Wheelchair 150 feet activity     Assist Wheelchair 150 feet activity did not occur: Safety/medical concerns(fatigue)          Medical Problem List and Plan: 1. Balance deficits with left lean as well as cognitive deficits affecting ADLs and mobility secondary to  left cerebellar hematoma with penetration of 4th ventricle and dependent within occipital horns and lateral ventricles  Continue CIR PT, OT, SLP 2.  Antithrombotics: -DVT/anticoagulation:  Pharmaceutical: Lovenox             -antiplatelet therapy: N/A 3. HA/back pain/Pain Management: Will continue tylenol prn  4. Mood: LCSW to follow for evaluation and support.              -antipsychotic agents: N/A 5. Neuropsych: This patient is not fully capable of making decisions on her own behalf. 6. Skin/Wound Care: Routine pressure relief measures.  Maintain adequate nutrition and hydration status. 7. Fluids/Electrolytes/Nutrition:      -intake poor to borderline  -Offer nutritional supplements  -ask RD to follow up.   8.  HTN: Monitor   Cardizem daily, decreased to 180 on 10/22 (10/21 dose held overnight)  Fair control 10/24    9.  Macular degeneration: Visual hallucinations have been worsening.  Exacerbated by bilateral cataracts as well as elevated pressures left eye.    Started Xalatan (new RX per family)  63. Urinary retention:   -has foley  Flomax started on 10/23 11. GERD: Continue Protonix. 12.  Hypoalbuminemia  Supplement initiated on 10/21 13. Constipation  Bowel meds increased on 10/21  Required disimpaction on 10/21  -moved bowels   10/24 14.  Post stroke dysphagia  D3 thins, advance as tolerated 15. FUO  UA equivocal, Ucx <10k insig growth, wbc's 4.7, afebrile since 10/22  Chest x-ray personally reviewed, unremarkable for infection  Continue bactrim for 3 days then  stop 16.  Sleep disturbance  Sleep chart  Ambien started 10/23 with some benefit  LOS: 5 days A FACE TO FACE EVALUATION WAS PERFORMED  Meredith Staggers 03/17/2019, 9:34 AM

## 2019-03-18 ENCOUNTER — Inpatient Hospital Stay (HOSPITAL_COMMUNITY): Payer: Medicare Other | Admitting: Speech Pathology

## 2019-03-18 ENCOUNTER — Inpatient Hospital Stay (HOSPITAL_COMMUNITY): Payer: Medicare Other

## 2019-03-18 ENCOUNTER — Inpatient Hospital Stay (HOSPITAL_COMMUNITY): Payer: Medicare Other | Admitting: Occupational Therapy

## 2019-03-18 ENCOUNTER — Ambulatory Visit: Payer: Medicare Other | Admitting: Internal Medicine

## 2019-03-18 DIAGNOSIS — I951 Orthostatic hypotension: Secondary | ICD-10-CM

## 2019-03-18 DIAGNOSIS — I610 Nontraumatic intracerebral hemorrhage in hemisphere, subcortical: Secondary | ICD-10-CM

## 2019-03-18 DIAGNOSIS — D72819 Decreased white blood cell count, unspecified: Secondary | ICD-10-CM

## 2019-03-18 DIAGNOSIS — I10 Essential (primary) hypertension: Secondary | ICD-10-CM | POA: Diagnosis not present

## 2019-03-18 LAB — CBC
HCT: 40.7 % (ref 36.0–46.0)
Hemoglobin: 13.2 g/dL (ref 12.0–15.0)
MCH: 32.1 pg (ref 26.0–34.0)
MCHC: 32.4 g/dL (ref 30.0–36.0)
MCV: 99 fL (ref 80.0–100.0)
Platelets: 215 10*3/uL (ref 150–400)
RBC: 4.11 MIL/uL (ref 3.87–5.11)
RDW: 13.1 % (ref 11.5–15.5)
WBC: 3.7 10*3/uL — ABNORMAL LOW (ref 4.0–10.5)
nRBC: 0 % (ref 0.0–0.2)

## 2019-03-18 LAB — BASIC METABOLIC PANEL
Anion gap: 10 (ref 5–15)
BUN: 12 mg/dL (ref 8–23)
CO2: 26 mmol/L (ref 22–32)
Calcium: 9.4 mg/dL (ref 8.9–10.3)
Chloride: 103 mmol/L (ref 98–111)
Creatinine, Ser: 0.83 mg/dL (ref 0.44–1.00)
GFR calc Af Amer: >60 mL/min (ref >60)
GFR calc non Af Amer: >60 mL/min (ref >60)
Glucose, Bld: 178 mg/dL — ABNORMAL HIGH (ref 70–99)
Potassium: 3.7 mmol/L (ref 3.5–5.1)
Sodium: 139 mmol/L (ref 135–145)

## 2019-03-18 MED ORDER — ENSURE ENLIVE PO LIQD
237.0000 mL | Freq: Three times a day (TID) | ORAL | Status: DC
  Administered 2019-03-19 – 2019-04-04 (×45): 237 mL via ORAL
  Filled 2019-03-18 (×3): qty 237

## 2019-03-18 MED ORDER — CHLORHEXIDINE GLUCONATE CLOTH 2 % EX PADS
6.0000 | MEDICATED_PAD | Freq: Two times a day (BID) | CUTANEOUS | Status: DC
  Administered 2019-03-18 – 2019-03-19 (×2): 6 via TOPICAL

## 2019-03-18 MED ORDER — TAMSULOSIN HCL 0.4 MG PO CAPS
0.8000 mg | ORAL_CAPSULE | Freq: Every day | ORAL | Status: DC
  Administered 2019-03-18 – 2019-04-03 (×17): 0.8 mg via ORAL
  Filled 2019-03-18 (×18): qty 2

## 2019-03-18 NOTE — Progress Notes (Signed)
PHYSICAL MEDICINE & REHABILITATION PROGRESS NOTE  Subjective/Complaints: Patient seen sitting up in bed this morning.  She states she did not sleep well overnight because she felt abandoned, but is not sure if she was having a dream.  ROS: Denies CP, SOB, N/V/D  Objective: Vital Signs: Blood pressure (!) 154/83, pulse 91, temperature 98.3 F (36.8 C), temperature source Oral, resp. rate 15, height 5\' 5"  (1.651 m), weight 64.9 kg, SpO2 91 %. No results found. Recent Labs    03/18/19 0837  WBC 3.7*  HGB 13.2  HCT 40.7  PLT 215   Recent Labs    03/18/19 0837  NA 139  K 3.7  CL 103  CO2 26  GLUCOSE 178*  BUN 12  CREATININE 0.83  CALCIUM 9.4    Physical Exam: BP (!) 154/83 (BP Location: Left Arm)   Pulse 91   Temp 98.3 F (36.8 C) (Oral)   Resp 15   Ht 5\' 5"  (1.651 m)   Wt 64.9 kg   SpO2 91%   BMI 23.81 kg/m  Constitutional: No distress . Vital signs reviewed. HENT: Normocephalic.  Atraumatic. Eyes: EOMI. No discharge. Cardiovascular: No JVD. Respiratory: Normal effort.  No stridor. GI: Non-distended. Skin: Warm and dry.  Intact. Psych: Normal mood.  Normal behavior. Musc: No edema in extremities.  No tenderness in extremities. Neurological:  Alert Motor:  Right upper extremity: 4/5 proximal to distal, unchanged Left upper extremities: 4-4+/5 proximal distal, unchanged Right lower extremity: 3+/5 proximal distal, stable Left lower extremity: 4/5 proximal to distal   Assessment/Plan: 1. Functional deficits secondary to left cerebellar hematoma which require 3+ hours per day of interdisciplinary therapy in a comprehensive inpatient rehab setting.  Physiatrist is providing close team supervision and 24 hour management of active medical problems listed below.  Physiatrist and rehab team continue to assess barriers to discharge/monitor patient progress toward functional and medical goals  Care Tool:  Bathing    Body parts bathed by patient:  Right arm, Left arm, Chest, Abdomen, Right upper leg, Left upper leg, Right lower leg, Left lower leg, Face   Body parts bathed by helper: Buttocks, Front perineal area     Bathing assist Assist Level: Moderate Assistance - Patient 50 - 74%     Upper Body Dressing/Undressing Upper body dressing   What is the patient wearing?: Bra, Button up shirt    Upper body assist Assist Level: Moderate Assistance - Patient 50 - 74%    Lower Body Dressing/Undressing Lower body dressing      What is the patient wearing?: Pants, Underwear/pull up     Lower body assist Assist for lower body dressing: Moderate Assistance - Patient 50 - 74%     Toileting Toileting    Toileting assist Assist for toileting: Maximal Assistance - Patient 25 - 49%     Transfers Chair/bed transfer  Transfers assist     Chair/bed transfer assist level: Minimal Assistance - Patient > 75%     Locomotion Ambulation   Ambulation assist      Assist level: Minimal Assistance - Patient > 75% Assistive device: Walker-rolling Max distance: 38 ft   Walk 10 feet activity   Assist     Assist level: Minimal Assistance - Patient > 75% Assistive device: Walker-rolling   Walk 50 feet activity   Assist Walk 50 feet with 2 turns activity did not occur: Safety/medical concerns(Per report )         Walk 150 feet activity   Assist  Walk 150 feet activity did not occur: Safety/medical concerns(Per report )         Walk 10 feet on uneven surface  activity   Assist Walk 10 feet on uneven surfaces activity did not occur: Safety/medical concerns(Per report)         Wheelchair     Assist   Type of Wheelchair: Manual    Wheelchair assist level: Minimal Assistance - Patient > 75% Max wheelchair distance: 25    Wheelchair 50 feet with 2 turns activity    Assist    Wheelchair 50 feet with 2 turns activity did not occur: Safety/medical concerns(fatigue)       Wheelchair 150 feet  activity     Assist Wheelchair 150 feet activity did not occur: Safety/medical concerns(fatigue)          Medical Problem List and Plan: 1. Balance deficits with left lean as well as cognitive deficits affecting ADLs and mobility secondary to  left cerebellar hematoma with penetration of 4th ventricle and dependent within occipital horns and lateral ventricles  Continue CIR 2.  Antithrombotics: -DVT/anticoagulation:  Pharmaceutical: Lovenox             -antiplatelet therapy: N/A 3. HA/back pain/Pain Management: Continue tylenol prn  4. Mood: LCSW to follow for evaluation and support.              -antipsychotic agents: N/A 5. Neuropsych: This patient is not fully capable of making decisions on her own behalf. 6. Skin/Wound Care: Routine pressure relief measures.  Maintain adequate nutrition and hydration status. 7. Fluids/Electrolytes/Nutrition:      -Offer nutritional supplements  Relatively poor intake on 10/26  BMP within acceptable range on 10/26 8.  HTN: Monitor   Cardizem daily, decreased to 180 on 10/22   Labile on 10/26  Orthostatics positive on 10/26   9.  Macular degeneration: Visual hallucinations have been worsening.  Exacerbated by bilateral cataracts as well as elevated pressures left eye.    Started Xalatan (new RX per family)  41. Urinary retention:   Flomax started on 10/23, increased on 10/26  Foley inserted due to discomfort, will consider voiding trial again this week 11. GERD: Continue Protonix. 12.  Hypoalbuminemia  Supplement initiated on 10/21 13. Constipation  Bowel meds increased on 10/21 14.  Post stroke dysphagia  D3 thins, advance as tolerated 15. FUO: Resolved  UA equivocal, Ucx <10k insig growth, wbc's 4.7, afebrile since 10/22  Chest x-ray personally reviewed, unremarkable for infection 16.  Sleep disturbance  Sleep chart  Ambien started 10/23 with some benefit 17.  Prediabetes  Elevated on 10/26 18.  Leukopenia  WBCs 3.7 on  10/26  Continue to monitor  LOS: 6 days A FACE TO FACE EVALUATION WAS PERFORMED  Jaymar Loeber Lorie Phenix 03/18/2019, 10:21 AM

## 2019-03-18 NOTE — Progress Notes (Signed)
Occupational Therapy Session Note  Patient Details  Name: Beth Foster MRN: [id number] Date of Birth: [birth year]  Today's Date: 03/18/2019 OT Individual Time: 8350-7573 OT Individual Time Calculation (min): 68 min    Short Term Goals: Week 1:  OT Short Term Goal 1 (Week 1): Pt will complete UB dressing with min assist for bra and button up shirt. OT Short Term Goal 2 (Week 1): Pt will complete LB bathing sit to stand with min assist. OT Short Term Goal 3 (Week 1): Pt will complete LB dressing sit to stand with min assist. OT Short Term Goal 4 (Week 1): Pt will ambulate to the toilet with min assist from wheelchari or EOB with RW.  Skilled Therapeutic Interventions/Progress Updates:    Pt received in recliner and declined bathing but agreeable to dressing.  Her blood pressure had improved to WNL from previous session. Pt was able to dress herself with mod A overall as she needed guidance with orientation of clothing (due to vision) and general A due to weakness. She did stand from recliner with cues to scoot forward first with min A several times during dressing as she had incontinent bowel episode in briefs and needed to be changed.  For standing, pt used RW for support but had a stronger posterior lean today. Cued pt to place more weight over toes as all her weight was in her heels.  She could hold a more upright stance for a few seconds at a time but needed constant cues to shift weight forward.   Pt completed stand pivot transfer with RW to wc to complete grooming at sink with set up.  Stand pivot to bed as she requested to lay down.  She frequently verbalized how tired she felt today.   Pt wanted to be in sidelying.  Positioned pillow behind back and between legs.  Pt participated well today despite feeling so tired.   Pt resting in bed with all needs met.   Therapy Documentation Precautions:  Precautions Precautions: Fall Restrictions Weight Bearing Restrictions: No    Vital  Signs: Therapy Vitals BP: 127/76 Patient Position (if appropriate): Sitting Pain: Pain Assessment Pain Score: 0-No pain    Therapy/Group: Individual Therapy  [place] 03/18/2019, 12:37 PM

## 2019-03-18 NOTE — Progress Notes (Signed)
Nutrition Follow-up  DOCUMENTATION CODES:   Not applicable  INTERVENTION:   - Increase Ensure Enlive po to TID, each supplement provides 350 kcal and 20 grams of protein (vanilla flavor)  - Magic Cup TID with meals, each supplement provides 290 kcal and 9 grams of protein (vanilla flavor)  - Continue Pro-stat 30 ml BID, each supplement provides 100 kcal and 15 grams of protein  - Continue MVI with minerals daily  NUTRITION DIAGNOSIS:   Increased nutrient needs related to wound healing as evidenced by estimated needs.  Ongoing, being addressed via oral nutrition supplements  GOAL:   Patient will meet greater than or equal to 90% of their needs  Progressing  MONITOR:   PO intake, Supplement acceptance, Weight trends, Skin  REASON FOR ASSESSMENT:   Consult Poor PO  ASSESSMENT:   85 year old female with PMH of HTN, prior stroke with decreased [place] RUE, glaucoma, macular degeneration with worsening of vision and recent reports of amaurosis fugax. Pt was admitted on 03/08/19 after found down and CT head done revealing small cerebellar peduncle hemorrhage with mild surrounding edema. CTA head/neck showed tortuosity and dilatation of supraclinoid ICA and no large vessel occlusion. MRI brain showed stable left cerebellar hematoma with penetration of 4th ventricle and dependent within occipital horns and lateral ventricles. Extensive chronic small vessel disease noted. Pt admitted to CIR on 10/20.  No new weights since admit.  Spoke with pt at bedside. Pt on the phone at time of RD visit and did not want to hang up. Noted ~15% completed lunch meal tray at bedside. Pt reports that the reason she is not eating much is that she does not like the food. Pt states that she does, however, really enjoy the vanilla Ensure Enlive shakes and likes vanilla ice cream. RD to order vanilla Magic Cups and increase Ensure Enlive order to TID.  Discussed with pt the importance of adequate PO intake  in preventing loss of lean body mass and helping pt feel stronger. Pt expresses understanding.  Meal Completion: 0-50% x last 8 meals  Medications reviewed and include: Ensure Enlive BID (pt accepting ~75% per MAR), Pro-stat 30 ml BID (pt accepting 100% per MAR), MVI with minerals daily, Protonix, Miralax, Senna  Labs reviewed.  NUTRITION - FOCUSED PHYSICAL EXAM:  Unable to complete at this time. Pt on the phone and did not want to hang up for RD to complete exam.  Diet Order:   Diet Order            DIET DYS 3 Room service appropriate? Yes with Assist; Fluid consistency: Thin  Diet effective now              EDUCATION NEEDS:   Education needs have been addressed  Skin:  Skin Assessment: Skin Integrity Issues: Skin Integrity Issues: Stage I: right buttocks  Last BM:  03/18/19  Height:   Ht Readings from Last 1 Encounters:  03/14/19 5\' 5"  (1.651 m)    Weight:   Wt Readings from Last 1 Encounters:  03/14/19 64.9 kg    Ideal Body Weight:  56.8 kg  BMI:  Body mass index is 23.81 kg/m.  Estimated Nutritional Needs:   Kcal:  1450-1650  Protein:  70-85 grams  Fluid:  1.4-1.6 L    Gaynell Face, MS, RD, LDN Inpatient Clinical Dietitian Pager: [phone] Weekend/After Hours: [phone]

## 2019-03-18 NOTE — Progress Notes (Signed)
Physical Therapy Session Note  Patient Details  Name: Beth Foster MRN: [id number] Date of Birth: 14-Jan-1929  Today's Date: 03/18/2019 PT Individual Time: 0915-1010 and 1630-1710 PT Individual Time Calculation (min): 55 min and 40 min  Short Term Goals: Week 1:  PT Short Term Goal 1 (Week 1): pt will move sit> supine with supervision PT Short Term Goal 2 (Week 1): pt will transfer bed>< w/c with min assist PT Short Term Goal 3 (Week 1): pt will propel w/c x 50' with min assist PT Short Term Goal 4 (Week 1): pt will perform gait with LRAD x 25' with mod assist  Skilled Therapeutic Interventions/Progress Updates:   Session 1: Received pt supine in bed, pt agreeable to PT and denied any pain during today's session. Session focused on functional mobility/transfers, LE strength, balance, ambulation, AD education, and improved tolerance to activity. Pt transferred supine<>sit EOB mod A for trunk control, posterior lean, and LE management with HOB elevated. PT donned shoes max A for time management purposes. Pt transferred stand<>pivot without AD mod A for turning and foot placement and required verbal cues for hand placement on armrests. Pt ambulated 82ft x 1 trial and 20ft x 1 trial with quad cane min A for balance, with 1 LOB when turning requiring mod A to correct. Pt reported feeling dizzy after ambulating 36ft but reported that it improved. After ambulating 49ft pt reported continuous dizziness. BP 117/83 HR 103bpm sitting on mat and 88/68 HR 120bpm in standing. Pt reported not feeling well and requested to return to room. Pt transferred [facility] >recliner stand pivot without AD mod A and verbal cues for turning, step sequence, and foot placement. PT donned ted hose max A. Concluded session with pt sitting in recliner, needs within reach, and seatbelt alarm on. RN aware.   Session 2: Pt supine in bed upon PT arrival, agreeable to therapy tx and denies pain at rest. Pt transferred to sitting EOB with min  assist and increased time to complete, verbal cues for techniques and hand placement. Upon sitting increased time given for pt to scoot hips to EOB, min assist. Sitting EOB pt worked on balance and anterior leaning to don and tie shoes, min assist needed. Pt reports not feeling well she also requires max encouragement for participation this session and repeatedly reports "I can't" when asked to try something. Pt performed sit<>stand with RW and mod assist, upon standing pt reported she needs to go to bathroom. Pt begins ambulating to the bathroom x 10 ft with RW and min assist, cues for increased step length and foot clearance, while ambulating into bathroom pt reports she is already going to bathroom, pt does not make it to toilet and soils clothing. RN available as +2 to assist with cleaning pt and doffing dirty clothing, min-mod assist for sit to stands from toilet during this. Pt then transferred back to bed at end of session stand pivot mod assist and left supine with needs in reach and bed alarm set.    Therapy Documentation Precautions:  Precautions Precautions: Fall Restrictions Weight Bearing Restrictions: No   Therapy/Group: Individual Therapy   Netta Corrigan, PT, DPT, CSRS 03/18/19  12:59 PM    Alfonse Alpers PT, DPT   03/18/2019, 9:55 AM

## 2019-03-18 NOTE — Progress Notes (Signed)
Speech Language Pathology Daily Session Note  Patient Details  Name: Beth Foster MRN: [id number] Date of Birth: 19-Feb-1929  Today's Date: 03/18/2019 SLP Individual Time: 1420-1440 SLP Individual Time Calculation (min): 20 min  Short Term Goals: Week 1: SLP Short Term Goal 1 (Week 1): Pt will utilizie memory compensatory strategies to aid in recall of novel, daily information with mod A verbal cues. SLP Short Term Goal 2 (Week 1): Pt will complete functional basic problem solving tasks pertaining to ADLs with min A verbal cues. SLP Short Term Goal 3 (Week 1): Pt will demonstrate self-awareness and self-correction of functional errors in problem solving tasks with min A verbal cues. SLP Short Term Goal 4 (Week 1): Pt will demonstrate selective attention in midly distracting environment for 15 minute intervals with min A verbal cues for redirection. SLP Short Term Goal 5 (Week 1): Pt will demonstrate orientation x4 with mod A verbal cues. SLP Short Term Goal 6 (Week 1): Pt will demonstrate use of word finding strategies in structured and unstructured mildly complex tasks with min A semantic cues.  Skilled Therapeutic Interventions: Pt was seen for skilled ST targeting cognitive goals.  Upon entrance to pt's room she reported feeling fatigued and when provided Min A question cues she recalled being unable to fully participate in physical exercise today (verified accuracy of her report with PT). However, she required multiple choice cues to correct verbal error (called gym [employer]). Due to pt's visual impairments, she was unable to read dates on a monthly calendar, however was oriented to month and could point to days her granddaughter would be working an unable to visit her on a large print calendar. She verbally sentenced days of the week and associated dates with 1 example and Min A verbal prompts from clinician. When presented with hypothetical safety scenarios, pt required Min to Mod A verbal  cues for functional basic problem solving. Pt eventually unable to maintain alertness and requested SLP leave so she could rest. As a result, pt missed 10 minutes of skilled ST today. Pt left in bed with alarm set and all needs within reach. Continue per current plan of care.      Pain Pain Assessment Pain Score: 0-No pain  Therapy/Group: Individual Therapy  Beth Foster 03/18/2019, 2:44 PM

## 2019-03-18 NOTE — Plan of Care (Signed)
  Problem: Consults Goal: RH STROKE PATIENT EDUCATION Description: See Patient Education module for education specifics  Outcome: Progressing   Problem: RH BOWEL ELIMINATION Goal: RH STG MANAGE BOWEL WITH ASSISTANCE Description: STG Manage Bowel with min Assistance. Outcome: Progressing Goal: RH STG MANAGE BOWEL W/MEDICATION W/ASSISTANCE Description: STG Manage Bowel with Medication with min Assistance. Outcome: Progressing   Problem: RH BLADDER ELIMINATION Goal: RH STG MANAGE BLADDER WITH ASSISTANCE Description: STG Manage Bladder With min Assistance Outcome: Progressing Goal: RH STG MANAGE BLADDER WITH MEDICATION WITH ASSISTANCE Description: STG Manage Bladder With Medication With min Assistance. Outcome: Progressing   Problem: RH SKIN INTEGRITY Goal: RH STG MAINTAIN SKIN INTEGRITY WITH ASSISTANCE Description: STG Maintain Skin Integrity With mod I Assistance. Outcome: Progressing   Problem: RH SAFETY Goal: RH STG DECREASED RISK OF FALL WITH ASSISTANCE Description: STG Decreased Risk of Fall With cues and reminder Assistance. Outcome: Progressing   Problem: RH COGNITION-NURSING Goal: RH STG ANTICIPATES NEEDS/CALLS FOR ASSIST W/ASSIST/CUES Description: STG Anticipates Needs/Calls for Assist With cues and reminders Assistance/Cues. Outcome: Progressing   Problem: RH PAIN MANAGEMENT Goal: RH STG PAIN MANAGED AT OR BELOW PT'S PAIN GOAL Description: Pain level less than 3 on scale of 0-10 Outcome: Progressing   Problem: RH KNOWLEDGE DEFICIT Goal: RH STG INCREASE KNOWLEDGE OF HYPERTENSION Description: Pt will be able to adhere to medication regimen for blood pressure control with min assist from family. Pt will demonstrate safety precaution to take upon discharge to prevent falls with cues and reminders from family. Outcome: Progressing   Problem: RH Vision Goal: RH LTG Vision (Specify) Outcome: Progressing

## 2019-03-19 ENCOUNTER — Inpatient Hospital Stay (HOSPITAL_COMMUNITY): Payer: Medicare Other

## 2019-03-19 ENCOUNTER — Inpatient Hospital Stay (HOSPITAL_COMMUNITY): Payer: Medicare Other | Admitting: Occupational Therapy

## 2019-03-19 ENCOUNTER — Inpatient Hospital Stay (HOSPITAL_COMMUNITY): Payer: Medicare Other | Admitting: Speech Pathology

## 2019-03-19 MED ORDER — MEGESTROL ACETATE 400 MG/10ML PO SUSP: 400.0000 mg | Freq: Two times a day (BID) | ORAL | Status: DC

## 2019-03-19 MED ORDER — ZOLPIDEM TARTRATE 5 MG PO TABS
5.0000 mg | ORAL_TABLET | Freq: Every day | ORAL | Status: DC
  Administered 2019-03-19 – 2019-03-27 (×9): 5 mg via ORAL
  Filled 2019-03-19 (×9): qty 1

## 2019-03-19 NOTE — Progress Notes (Addendum)
[place] PHYSICAL MEDICINE & REHABILITATION PROGRESS NOTE  Subjective/Complaints: Patient seen sitting up in bed this morning.  She states she slept well overnight.  She did not sleep well overnight per sleep chart.  ROS: Denies CP, SOB, N/V/D  Objective: Vital Signs: Blood pressure 110/67, pulse 95, temperature 99.1 F (37.3 C), temperature source Oral, resp. rate 16, height 5\' 5"  (1.651 m), weight 64.9 kg, SpO2 95 %. No results found. Recent Labs    03/18/19 0837  WBC 3.7*  HGB 13.2  HCT 40.7  PLT 215   Recent Labs    03/18/19 0837  NA 139  K 3.7  CL 103  CO2 26  GLUCOSE 178*  BUN 12  CREATININE 0.83  CALCIUM 9.4    Physical Exam: BP 110/67 (BP Location: Right Arm)   Pulse 95   Temp 99.1 F (37.3 C) (Oral)   Resp 16   Ht 5\' 5"  (1.651 m)   Wt 64.9 kg   SpO2 95%   BMI 23.81 kg/m  Constitutional: No distress . Vital signs reviewed. HENT: Normocephalic.  Atraumatic. Eyes: EOMI. No discharge. Cardiovascular: No JVD. Respiratory: Normal effort.  No stridor. GI: Non-distended. Skin: Warm and dry.  Intact. Psych: Normal mood.  Normal behavior. Musc: No edema in extremities.  No tenderness in extremities. Neurological:  Alert and oriented, except for date of month Motor:  Right upper extremity: 4/5 proximal to distal, unchanged Left upper extremities: 4-4+/5 proximal distal, unchanged Right lower extremity: 3+/5 proximal distal, unchanged Left lower extremity: 4/5 proximal to distal, unchanged  Assessment/Plan: 1. Functional deficits secondary to left cerebellar hematoma which require 3+ hours per day of interdisciplinary therapy in a comprehensive inpatient rehab setting.  Physiatrist is providing close team supervision and 24 hour management of active medical problems listed below.  Physiatrist and rehab team continue to assess barriers to discharge/monitor patient progress toward functional and medical goals  Care Tool:  Bathing    Body parts  bathed by patient: Right upper leg, Left upper leg, Face, Front perineal area, Abdomen, Chest, Left arm, Right arm   Body parts bathed by helper: Right lower leg, Left lower leg, Buttocks     Bathing assist Assist Level: Moderate Assistance - Patient 50 - 74%     Upper Body Dressing/Undressing Upper body dressing   What is the patient wearing?: Bra, Pull over shirt    Upper body assist Assist Level: Moderate Assistance - Patient 50 - 74%    Lower Body Dressing/Undressing Lower body dressing      What is the patient wearing?: Pants, Underwear/pull up     Lower body assist Assist for lower body dressing: Maximal Assistance - Patient 25 - 49%     Toileting Toileting    Toileting assist Assist for toileting: Maximal Assistance - Patient 25 - 49%     Transfers Chair/bed transfer  Transfers assist     Chair/bed transfer assist level: Moderate Assistance - Patient 50 - 74%     Locomotion Ambulation   Ambulation assist      Assist level: Minimal Assistance - Patient > 75% Assistive device: Walker-rolling Max distance: 40  ft   Walk 10 feet activity   Assist     Assist level: Minimal Assistance - Patient > 75% Assistive device: Walker-rolling   Walk 50 feet activity   Assist Walk 50 feet with 2 turns activity did not occur: Safety/medical concerns(Per report )         Walk 150 feet activity  Assist Walk 150 feet activity did not occur: Safety/medical concerns(Per report )         Walk 10 feet on uneven surface  activity   Assist Walk 10 feet on uneven surfaces activity did not occur: Safety/medical concerns(Per report)         Wheelchair     Assist   Type of Wheelchair: Manual    Wheelchair assist level: Minimal Assistance - Patient > 75% Max wheelchair distance: 25    Wheelchair 50 feet with 2 turns activity    Assist    Wheelchair 50 feet with 2 turns activity did not occur: Safety/medical concerns(fatigue)        Wheelchair 150 feet activity     Assist Wheelchair 150 feet activity did not occur: Safety/medical concerns(fatigue)          Medical Problem List and Plan: 1. Balance deficits with left lean as well as cognitive deficits affecting ADLs and mobility secondary to  left cerebellar hematoma with penetration of 4th ventricle and dependent within occipital horns and lateral ventricles  Continue CIR 2.  Antithrombotics: -DVT/anticoagulation:  Pharmaceutical: Lovenox             -antiplatelet therapy: N/A 3. HA/back pain/Pain Management: Continue tylenol prn  4. Mood: LCSW to follow for evaluation and support.              -antipsychotic agents: N/A 5. Neuropsych: This patient is not fully capable of making decisions on her own behalf. 6. Skin/Wound Care: Routine pressure relief measures.  Maintain adequate nutrition and hydration status. 7. Fluids/Electrolytes/Nutrition:      -Offer nutritional supplements  Relatively poor intake on 10/27, however taking supplements   BMP within acceptable range on 10/26, except for glucose 8.  HTN: Monitor   Cardizem daily, decreased to 180 on 10/22   Labile on 10/27  Orthostatics positive on 10/26  9.  Macular degeneration: Visual hallucinations have been worsening.  Exacerbated by bilateral cataracts as well as elevated pressures left eye.    Started Xalatan (new RX per family)  8. Urinary retention:   Flomax started on 10/23, increased on 10/26  DC Foley today with voiding trial, discussed with patient 11. GERD: Continue Protonix. 12.  Hypoalbuminemia  Supplement initiated on 10/21 13. Constipation  Bowel meds increased on 10/21 14.  Post stroke dysphagia  D3 thins, advance as tolerated 15. FUO: Resolved  UA equivocal, Ucx <10k insig growth, wbc's 4.7, afebrile since 10/22  Chest x-ray personally reviewed, unremarkable for infection 16.  Sleep disturbance  Sleep chart  Ambien started 10/23 with some benefit, increased on 10/27 17.   Prediabetes  Elevated on 10/26 18.  Leukopenia  WBCs 3.7 on 10/26  Continue to monitor  LOS: 7 days A FACE TO FACE EVALUATION WAS PERFORMED  Ankit Lorie Phenix 03/19/2019, 1:38 PM

## 2019-03-19 NOTE — Progress Notes (Signed)
Occupational Therapy Session Note  Patient Details  Name: Beth Foster MRN: [id number] Date of Birth: [birth year]  Today's Date: 03/19/2019 OT Individual Time: [id number] OT Individual Time Calculation (min): 75 min    Short Term Goals: Week 1:  OT Short Term Goal 1 (Week 1): Pt will complete UB dressing with min assist for bra and button up shirt. OT Short Term Goal 2 (Week 1): Pt will complete LB bathing sit to stand with min assist. OT Short Term Goal 3 (Week 1): Pt will complete LB dressing sit to stand with min assist. OT Short Term Goal 4 (Week 1): Pt will ambulate to the toilet with min assist from wheelchari or EOB with RW.  Skilled Therapeutic Interventions/Progress Updates:    Pt received in bed and she was focused on how much pain her tailbone was in and that she was extremely tired and just not feeling well. Blood pressure in supine was on the lower side, so pt completed LB bathing bed level with mod A so thigh high TEDs could be donned. Pt sat to EOB with mod A and continually stated that she did not feel well and was so tired.  Had pt take frequent rest breaks.  She donned pants with max A. In standing pt had a posterior lean. Would cue her to stand on her tip toes and then relax her heels down. This would improve her postural control for a few seconds.  Pt completed RW stand pivot to w/c with mod A and cues due to posterior lean, internal distraction and decreased problem solving.   Pt sat at sink to complete grooming and UB bathing and dressing.  She was not able to recall events of yesterday as she had no idea what had happened to her clothing and shoes. (they were being laundered)   She was very focused on her catheter.  She stated it was very uncomfortable and wanted to have it removed but was worried they would try to remove it too soon.  Pt worked on standing and transferring to recliner. Set up with pillows to support her tailbone/ back. Belt alarm on and call lights in reach.     Therapy Documentation Precautions:  Precautions Precautions: Fall Restrictions Weight Bearing Restrictions: No    Vital Signs: Therapy Vitals BP: (115/62) Patient Position (if appropriate): Sitting Pain: Pain Assessment Pain Scale: 0-10 Pain Score: 0-No pain ADL: ADL Eating: Set up Where Assessed-Eating: Edge of bed Grooming: Setup Where Assessed-Grooming: Wheelchair Upper Body Bathing: Supervision/safety Where Assessed-Upper Body Bathing: Wheelchair Lower Body Bathing: Moderate assistance Where Assessed-Lower Body Bathing: Wheelchair Upper Body Dressing: Moderate assistance Where Assessed-Upper Body Dressing: Wheelchair Lower Body Dressing: Moderate assistance Where Assessed-Lower Body Dressing: Wheelchair Toileting: Moderate assistance Where Assessed-Toileting: Bedside Commode Toilet Transfer: Moderate assistance Toilet Transfer Method: Stand pivot Toilet Transfer Equipment: Bedside commode   Therapy/Group: Individual Therapy  [place] 03/19/2019, 9:24 AM

## 2019-03-19 NOTE — Progress Notes (Signed)
Speech Language Pathology Weekly Progress and Session Note  Patient Details  Name: Beth Foster MRN: [id number] Date of Birth: 03-26-1929  Beginning of progress report period: March 12, 2019 End of progress report period: March 19, 2019  Today's Date: 03/19/2019 SLP Individual Time: 7353-2992 SLP Individual Time Calculation (min): 46 min  Short Term Goals: Week 1: SLP Short Term Goal 1 (Week 1): Pt will utilizie memory compensatory strategies to aid in recall of novel, daily information with mod A verbal cues. SLP Short Term Goal 1 - Progress (Week 1): Progressing toward goal SLP Short Term Goal 2 (Week 1): Pt will complete functional basic problem solving tasks pertaining to ADLs with min A verbal cues. SLP Short Term Goal 2 - Progress (Week 1): Met SLP Short Term Goal 3 (Week 1): Pt will demonstrate self-awareness and self-correction of functional errors in problem solving tasks with min A verbal cues. SLP Short Term Goal 3 - Progress (Week 1): Progressing toward goal SLP Short Term Goal 4 (Week 1): Pt will demonstrate selective attention in midly distracting environment for 15 minute intervals with min A verbal cues for redirection. SLP Short Term Goal 4 - Progress (Week 1): Met SLP Short Term Goal 5 (Week 1): Pt will demonstrate orientation x4 with mod A verbal cues. SLP Short Term Goal 5 - Progress (Week 1): Met SLP Short Term Goal 6 (Week 1): Pt will demonstrate use of word finding strategies in structured and unstructured mildly complex tasks with min A semantic cues. SLP Short Term Goal 6 - Progress (Week 1): Met    New Short Term Goals: Week 2: SLP Short Term Goal 1 (Week 2): Pt will utilizie memory compensatory strategies to aid in recall of novel, daily information with mod A verbal cues. SLP Short Term Goal 2 (Week 2): Pt will complete functional basic problem solving tasks pertaining to ADLs with Supervision A verbal cues. SLP Short Term Goal 3 (Week 2): Pt will  demonstrate self-awareness and self-correction of functional errors in problem solving tasks with min A verbal cues. SLP Short Term Goal 4 (Week 2): Pt will demonstrate selective attention in midly distracting environment for 15 minute intervals with Supervision A verbal cues for redirection. SLP Short Term Goal 5 (Week 2): Pt will demonstrate orientation x4 with Min A verbal cues. SLP Short Term Goal 6 (Week 2): Pt will demonstrate use of word finding strategies in structured and unstructured mildly complex tasks with Supervision A semantic cues.  Weekly Progress Updates: Pt has made slow but functional gains and met 4 out of 6 short term goals. Pt is currently Min-Mod assist for due to cognitive impairments impacting functional basic problem solving, emergent awareness, short term memory, and selective attention. Pt has demonstrated improved basic functional problem solving, orientation, and sustained attention. Pt education is ongoing; no family has been present for ST yet. Pt would continue to benefit from skilled ST while inpatient in order to maximize functional independence and reduce burden of care prior to discharge. Anticipate that pt will need 24/7 supervision at discharge in addition to skilled ST follow up at next level of care.      Intensity: Minumum of 1-2 x/day, 30 to 90 minutes Frequency: 3 to 5 out of 7 days Duration/Length of Stay: 10-14 days Treatment/Interventions: Cognitive remediation/compensation;Cueing hierarchy;Functional tasks;Internal/external aids;Patient/family education;Speech/Language facilitation   Daily Session  Skilled Therapeutic Interventions: Pt was seen for skilled ST targeting cognitive goals. Upon therapist's arrival pt reported "feeling just terrible." RN notified of pt's  headache and buttock pain; PA also aware and monitoring pt (in conversation with PA, pt also stated she felt dizzy). With Max encouragement, pt agreeable to participate in [place] session. Max  A verbal cues provided for basic verbal problem solving to determine therapy appointment start and end times (pt unable to read schedule, even when modified to large/bold black print, thus she is unable to use it as visual aid). She was independently oriented to self, place, situation and month, however Mod A verbal cues provided for pt to orient to date. At pt's request, SLP assisted pt in using rolling walker to transfer from sitting in wheelchair to standing to temporarily relieve buttock pain/pressure, during which Mod A verbal cues were required for sequencing and safety awareness (pt attempted to stand before seatbelt alarm was removed or walker in front of her). Min-Mod A verbal cues were also required during a basic color sorting task (field of 5 colors). Min A cues and environmental modifications made for pt to selectively attend to tasks. Pt was left sitting in wheelchair with seatbelt alarm engaged and all needs within reach. Continue per current plan of care.      Pain Pain Assessment Pain Scale: 0-10 Pain Score: 8  Pain Type: Acute pain Pain Location: Head Pain Orientation: Mid Pain Descriptors / Indicators: Headache Pain Onset: On-going Patients Stated Pain Goal: 0 Pain Intervention(s): RN made aware;Medication (See eMAR)(PA notified) Multiple Pain Sites: Yes 2nd Pain Site Pain Score: 8 Pain Type: Acute pain Pain Location: Buttocks Pain Orientation: Mid Pain Onset: On-going Pain Intervention(s): Distraction;Emotional support;RN made aware  Therapy/Group: Individual Therapy  Arbutus Leas 03/19/2019, 1:55 PM

## 2019-03-19 NOTE — Progress Notes (Signed)
Patient appears lethargic, weak and expressed "not feeling well". Low back pain present, denies nausea, diarrhea noted yesterday. Generalized malaise. Temp 99.1. Increased sediment in urine. Spoke to [place], no new orders received.  [place]

## 2019-03-19 NOTE — Plan of Care (Signed)
  Problem: Consults Goal: RH STROKE PATIENT EDUCATION Description: See Patient Education module for education specifics  Outcome: Progressing   Problem: RH BOWEL ELIMINATION Goal: RH STG MANAGE BOWEL WITH ASSISTANCE Description: STG Manage Bowel with min Assistance. Outcome: Progressing Goal: RH STG MANAGE BOWEL W/MEDICATION W/ASSISTANCE Description: STG Manage Bowel with Medication with min Assistance. Outcome: Progressing   Problem: RH BLADDER ELIMINATION Goal: RH STG MANAGE BLADDER WITH ASSISTANCE Description: STG Manage Bladder With min Assistance Outcome: Progressing Goal: RH STG MANAGE BLADDER WITH MEDICATION WITH ASSISTANCE Description: STG Manage Bladder With Medication With min Assistance. Outcome: Progressing   Problem: RH SKIN INTEGRITY Goal: RH STG MAINTAIN SKIN INTEGRITY WITH ASSISTANCE Description: STG Maintain Skin Integrity With mod I Assistance. Outcome: Progressing   Problem: RH SAFETY Goal: RH STG DECREASED RISK OF FALL WITH ASSISTANCE Description: STG Decreased Risk of Fall With cues and reminder Assistance. Outcome: Progressing   Problem: RH COGNITION-NURSING Goal: RH STG ANTICIPATES NEEDS/CALLS FOR ASSIST W/ASSIST/CUES Description: STG Anticipates Needs/Calls for Assist With cues and reminders Assistance/Cues. Outcome: Progressing   Problem: RH PAIN MANAGEMENT Goal: RH STG PAIN MANAGED AT OR BELOW PT'S PAIN GOAL Description: Pain level less than 3 on scale of 0-10 Outcome: Progressing   Problem: RH KNOWLEDGE DEFICIT Goal: RH STG INCREASE KNOWLEDGE OF HYPERTENSION Description: Pt will be able to adhere to medication regimen for blood pressure control with min assist from family. Pt will demonstrate safety precaution to take upon discharge to prevent falls with cues and reminders from family. Outcome: Progressing   Problem: RH Vision Goal: RH LTG Vision (Specify) Outcome: Progressing

## 2019-03-19 NOTE — Progress Notes (Signed)
Physical Therapy Session Note  Patient Details  Name: Beth Foster MRN: [id number] Date of Birth: 23-Feb-1929  Today's Date: 03/19/2019 PT Individual Time: 1130-1200 and 1500-1541 PT Individual Time Calculation (min): 30 min and 41 min   Short Term Goals: Week 1:  PT Short Term Goal 1 (Week 1): pt will move sit> supine with supervision PT Short Term Goal 2 (Week 1): pt will transfer bed>< w/c with min assist PT Short Term Goal 3 (Week 1): pt will propel w/c x 50' with min assist PT Short Term Goal 4 (Week 1): pt will perform gait with LRAD x 25' with mod assist  Skilled Therapeutic Interventions/Progress Updates:    Session 1: Pt seated in recliner upon PT arrival, agreeable to therapy tx and denies pain. Therapist donned shoes total assist for time management. Pt performed sit<>stand from recliner with mod assist and RW, increased posterior lean with manual facilitation and verbal cues to correct. Pt ambulated x 10 ft with RW and min assist to her w/c, therapist assisting with RW steering/management. Pt transported to gym for time management. Pt ambulated x 40 ft with RW and min assist, therapist assisting with RW management, cues for step length. Pt worked on static standing balance without UE support x 2 trials, pt with posterior lean requiring cues/facilitation to correct. Pt performed standing toe taps on aerobic step with RW for UE support working on balance and foot clearance, min assist. Pt transported back to room and left in w/c with needs in reach and chair alarm set.   Session 2: Pt supine in bed upon PT arrival, agreeable to therapy tx and denies pain. Pt's granddaughter Vicente Males) present in the room, discussed d/c planning and Vicente Males reports that she is unsure if she will be able to provide the needed care as she works full-time from home and has a Psychologist, occupational home. Discussed going to a SNF following d/c from here. Pt transferred to sitting EOB with min assist, sit>stand min assist and stand  pivot to w/c with min assist. Pt transported to the gym. Pt ambulated 2 x 45 ft this session with RW and min assist, cues for increased step length, upright posture and assist with RW management especially during turns. Pt transported to dayroom and used kinetron for LE strengthening on 30 cm/sec resistance 3 x 2 min bouts. Pt transported back to room and left in w/c with needs in reach and chair alarm set.    Therapy Documentation Precautions:  Precautions Precautions: Fall Restrictions Weight Bearing Restrictions: No    Therapy/Group: Individual Therapy  Netta Corrigan, PT, DPT, CSRS 03/19/2019, 7:58 AM

## 2019-03-20 ENCOUNTER — Inpatient Hospital Stay (HOSPITAL_COMMUNITY): Payer: Medicare Other | Admitting: Occupational Therapy

## 2019-03-20 ENCOUNTER — Inpatient Hospital Stay (HOSPITAL_COMMUNITY): Payer: Medicare Other | Admitting: Physical Therapy

## 2019-03-20 ENCOUNTER — Inpatient Hospital Stay (HOSPITAL_COMMUNITY): Payer: Medicare Other | Admitting: Speech Pathology

## 2019-03-20 ENCOUNTER — Inpatient Hospital Stay (HOSPITAL_COMMUNITY): Payer: Medicare Other

## 2019-03-20 NOTE — Progress Notes (Signed)
Physical Therapy Session Note  Patient Details  Name: Beth Foster MRN: [id number] Date of Birth: May 21, 1929  Today's Date: 03/20/2019 PT Individual Time: 1400-1430 PT Individual Time Calculation (min): 30 min   Short Term Goals: Week 1:  PT Short Term Goal 1 (Week 1): pt will move sit> supine with supervision PT Short Term Goal 2 (Week 1): pt will transfer bed>< w/c with min assist PT Short Term Goal 3 (Week 1): pt will propel w/c x 50' with min assist PT Short Term Goal 4 (Week 1): pt will perform gait with LRAD x 25' with mod assist  Skilled Therapeutic Interventions/Progress Updates:    Pt received sidelying in bed,pt reports having urge to have a BM. With max encouragement pt agreeable to attempt toileting. Supine to sit with min A with HOB elevated, posterior lean in sitting and in standing. Sit to stand with min A to RW, posterior bias. Ambulation x 15 ft with RW and min A to bathroom, lateral lean to the L during gait. Toilet transfer with min A, max A for clothing management. Pt unable to void once seated on toilet. Encouraged pt to leave room to participate in therapy, pt declines. Encouraged pt to participate in therapy in her room, pt declines due to fatigue. Assisted pt back to bed. Sit to supine mod A for BLE management. Pt left sidelying in bed with needs in reach at end of session, bed alarm in place. Pt missed 30 min of scheduled therapy session due to fatigue.  Therapy Documentation Precautions:  Precautions Precautions: Fall Restrictions Weight Bearing Restrictions: No General: PT Amount of Missed Time (min): 30 Minutes PT Missed Treatment Reason: Patient fatigue    Therapy/Group: Individual Therapy   Excell Seltzer, PT, DPT  03/20/2019, 2:59 PM

## 2019-03-20 NOTE — Progress Notes (Signed)
Occupational Therapy Session Note  Patient Details  Name: Beth Foster MRN: [id number] Date of Birth: January 13, 1929  Today's Date: 03/20/2019 OT Individual Time: 1130-1157 OT Individual Time Calculation (min): 27 min    Short Term Goals: Week 2:  OT Short Term Goal 1 (Week 2): Continue working towards week 1 STGs.     Skilled Therapeutic Interventions/Progress Updates:    Pt received in bed sidelying with no c/o pain. Completed log rolling to R side with (S) and min VC for hand placements. Completes sidelying <> EOB with min A, reporting pain in lower back from moving. Completes functional mobility to sink to dry her hair. Pt dries hair in standing with min A for posterior lean, requested rest break after 4 min of standing. Pt reporting she needs to use the bathroom. Completes functional mobility to toilet with RW and min A; completes toileting with mod A for balance and pulling brief up over buttocks. Pt unable to void urine into toilet. End of session pt left in bed, bed alarm set and all needs met.   Therapy Documentation Precautions:  Precautions Precautions: Fall Restrictions Weight Bearing Restrictions: No       Therapy/Group: Individual Therapy  Jamarr Treinen 03/20/2019, 1:00 PM

## 2019-03-20 NOTE — Progress Notes (Signed)
Occupational Therapy Weekly Progress Note  Patient Details  Name: KHUSHBU PIPPEN MRN: [id number] Date of Birth: 1929-03-20  Beginning of progress report period: March 13, 2019 End of progress report period: March 20, 2019  Today's Date: 03/20/2019 OT Individual Time: 7867-5449 OT Individual Time Calculation (min): 65 min  and Today's Date: 03/20/2019 OT Missed Time: 10 Minutes Missed Time Reason: Patient fatigue   Patient has met 0 of 4 short term goals.  STGs were set at a min A level and pt is continuing to need a mod A level of support with her self care and mobility. She has had challenges with endurance.    Patient continues to demonstrate the following deficits: muscle weakness, decreased cardiorespiratoy endurance, decreased visual acuity, decreased awareness, decreased memory and delayed processing and decreased standing balance, decreased postural control and decreased balance strategies and therefore will continue to benefit from skilled OT intervention to enhance overall performance with BADL.  Patient not progressing toward long term goals.  See goal revision..  Plan of care revisions: LTGs will not be revised at this time. pt will reassessed at the next weekly progress assessment on Nov. 6, 2020..  OT Short Term Goals Week 1:  OT Short Term Goal 1 (Week 1): Pt will complete UB dressing with min assist for bra and button up shirt. OT Short Term Goal 1 - Progress (Week 1): Progressing toward goal OT Short Term Goal 2 (Week 1): Pt will complete LB bathing sit to stand with min assist. OT Short Term Goal 2 - Progress (Week 1): Progressing toward goal OT Short Term Goal 3 (Week 1): Pt will complete LB dressing sit to stand with min assist. OT Short Term Goal 3 - Progress (Week 1): Progressing toward goal OT Short Term Goal 4 (Week 1): Pt will ambulate to the toilet with min assist from wheelchari or EOB with RW. OT Short Term Goal 4 - Progress (Week 1): Progressing toward  goal Week 2:  OT Short Term Goal 1 (Week 2): Continue working towards week 1 STGs.  Skilled Therapeutic Interventions/Progress Updates:    Pt received in w/c c/o extreme fatigue and requesting to go to bed.  Pt had previously had a large bowel accident and nursing cleansed pt, but recommended pt that she try a shower. After much encouragement, pt agreed to shower.   Pt taken into bathroom via w/c and she completed stand pivot in and out of shower to bench with bars with min A.  Min -mod A to shower,  Mod A UB dressing, max LB dressing.  Worked on postural control in standing with focus on wt on her toes.  Pt transferred to EOB.  She was very tired at this point.  Pt stated she need to lay down.  Pt resting in bed with bed alarm set.    Therapy Documentation Precautions:  Precautions Precautions: Fall Restrictions Weight Bearing Restrictions: No  Pain: Pain Assessment Pain Scale: 0-10 Pain Score: 0-No pain ADL: ADL Eating: Set up Where Assessed-Eating: Edge of bed Grooming: Setup Where Assessed-Grooming: Wheelchair Upper Body Bathing: Supervision/safety Where Assessed-Upper Body Bathing: Wheelchair Lower Body Bathing: Moderate assistance Where Assessed-Lower Body Bathing: Wheelchair Upper Body Dressing: Moderate assistance Where Assessed-Upper Body Dressing: Wheelchair Lower Body Dressing: Moderate assistance Where Assessed-Lower Body Dressing: Wheelchair Toileting: Moderate assistance Where Assessed-Toileting: Bedside Commode Toilet Transfer: Moderate assistance Toilet Transfer Method: Stand pivot Toilet Transfer Equipment: Bedside commode   Therapy/Group: Individual Therapy  [place] 03/20/2019, 9:03 AM

## 2019-03-20 NOTE — Patient Care Conference (Signed)
Inpatient RehabilitationTeam Conference and Plan of Care Update Date: 03/20/2019   Time: 11:15 AM   Patient Name: Beth Foster      Medical Record Number: [id number]  Date of Birth: 07-18-1928 Sex: Female         Room/Bed: 4W17C/4W17C-01 Payor Info: Payor: [employer] MEDICARE / Plan: [facility] MEDICARE / Product Type: *No Product type* /    Admit Date/Time:  03/12/2019  4:38 PM  Primary Diagnosis:  ICH (intracerebral hemorrhage) ([place])  Patient Active Problem List   Diagnosis Date Noted  . Leukopenia   . Orthostatic hypotension   . Labile blood pressure   . Dysphagia, post-stroke   . FUO (fever of unknown origin)   . Sleep disturbance   . Hypoalbuminemia due to protein-calorie malnutrition ([place])   . Urinary retention   . Benign essential HTN   . Essential hypertension 03/12/2019  . Hyperlipidemia 03/12/2019  . UTI (urinary tract infection) 03/12/2019  . Enterococcus UTI   . Macular degeneration   . Cognitive deficit, post-stroke   . Pressure injury of skin 03/09/2019  . ICH (intracerebral hemorrhage) ([facility]) w/ IVH, L cerebellar 03/08/2019  . Vascular dementia without behavioral disturbance ([place]) 02/28/2019  . Cerebrovascular disease 02/28/2019  . Visual field cut 02/28/2019  . Slow transit constipation 02/28/2019  . Rectal bleeding 02/28/2019  . Venous insufficiency 07/15/2016  . Visual hallucinations 07/15/2016  . OAB (overactive bladder) 07/15/2016  . Cellulitis 07/09/2016  . Cellulitis of right lower extremity 07/09/2016  . Neck pain 01/15/2016  . Left cervical lymphadenopathy 01/08/2016  . Cough 06/09/2015  . Dyspnea 06/09/2015  . Acute bronchitis 06/09/2015  . Allergic rhinitis 04/15/2015  . Syncope and collapse 01/10/2015  . Contusion of left supra orbit 01/10/2015  . Laceration of right eyebrow 01/10/2015  . DOE (dyspnea on exertion) 01/10/2015  . Exertional chest pain 01/10/2015  . Fatigue 01/10/2015  . Acute hyperglycemia 01/10/2015  . Hemorrhoids  10/28/2013  . Vitamin D deficiency 10/28/2013  . Painful lumpy right breast 12/06/2012  . HLD (hyperlipidemia) 12/06/2012  . Essential hypertension, benign 12/06/2012  . Malignant neoplasm of breast (female), unspecified site   . Mild cognitive impairment with memory loss 08/20/2012  . Gait disorder 08/20/2012  . Wound, open, head 08/20/2012  . GERD (gastroesophageal reflux disease) 08/20/2012    Expected Discharge Date: Expected Discharge Date: (NH versus Home confirm with granddaughter)  Team Members Present: Physician leading conference: Dr. Delice Lesch Social Worker Present: Ovidio Kin, LCSW Nurse Present: Sandrea Hammond, LPN PT Present: Donnamae Jude, PT OT Present: Meriel Pica, OT SLP Present: Jettie Booze, CF-SLP PPS Coordinator present : Gunnar Fusi, SLP Case Manager: Karene Fry, RN     Current Status/Progress Goal Weekly Team Focus  Bowel/Bladder   d/c foley during day shift, pt can be incontinent at times per report. pt encouraged to alert staff when need to void, pt unable to void and was cathed, LBM 10/27, pt encouraged to increase p.o. intake due to sediment/cloudy and low BP  time toileting, reinforce all staff when need to void  assess toileting q shift and prn   Swallow/Nutrition/ Hydration             ADL's   S UB bathing, minA UB dressing, mod LB bathing, mod -max LB dressing and toileting, transfers min -mod A - fluctuates based on pain in tailbone, decreased blood pressure, endurance, occasional dizziness, impaired postural control with posterior lean, decreased memory  min A dressing, S bathing and toileting, S transfers, S  sit to stand and min A dyn stand balance (goals will likely be downgraded to a min-mod A level - will reassess at weekly progress interval)  ADL training, functional transfers, balance/ postural awareness, visual compenstation, cognition, general endurance, pt/family education   Mobility   min assist for bed mobility, mod assist  sit<>stand, min-mod for transfers and min assist gait 40-50 ft with RW  supervision/min assist  transfers, gait, standing balance, endurance, strength, activity tolerance, awareness   Communication   Min-Mod A word finding stategies  Supervision A  word finding strategies in functional conversation, pt/family edu   Safety/Cognition/ Behavioral Observations  Min-Mod A selective attention, basic problem solving, Min A orientation, Mod-Max A short term recall  Min-Supervision A  selective attention, compensatory memory strategies, basic functional problem solving, pt/family edu   Pain   pt has c/o pain, has scheduled and prns  pain less than 5  assess pain q shift and prn   Skin   pt has bruising on head, arms, legs and bottom  prevent new skin breakdown  assess skin q shift and prn    Rehab Goals Patient on target to meet rehab goals: No Rehab Goals Revised: Downgraidng goals *See Care Plan and progress notes for long and short-term goals.     Barriers to Discharge  Current Status/Progress Possible Resolutions Date Resolved   Nursing                  PT                    OT                  SLP                SW                Discharge Planning/Teaching Needs:  Adline Mango if granddaughter can provide the care pt will need at DC. Looks like goals will be downgraded to min-mod level.  Granddaughter here to see in therapies   Team Discussion: Poor oral intake, supplements taken, adjusting BP meds, urinary retention, foley DC yesterday.  Skin looks good, tylenol last pm, inc stool, not voided.  MD says scan bladder q4h and cath if needed.  OT - DC to Gdtr house unlikely due to 2 story home, B/B upstairs.  Fatigues quickly, decreased endurance, tired, showered min/md A, goals S/Min, may have to downgrade goals.  Posterior lean with standing.  PT min bed mobility, mod sit to stand, min/mod transfers and gait.  SLP tired, visual impairments min/mod A, poor safety awareness, goals min  cognition.  SW to talk with GDtr about possible SNF placement.   Revisions to Treatment Plan: N/A     Medical Summary Current Status: Balance deficits with left lean as well as cognitive deficits affecting ADLs and mobility secondary to  left cerebellar hematoma with penetration of 4th ventricle and dependent within occipital horns and lateral ventricles Weekly Focus/Goal: Improve mobility, urinary retention, bowel movements, HTN, orthostasis  Barriers to Discharge: Behavior;Medical stability   Possible Resolutions to Barriers: Therapies, bowel/bladder meds, optimize BP, optimize bowel meds   Continued Need for Acute Rehabilitation Level of Care: The patient requires daily medical management by a physician with specialized training in physical medicine and rehabilitation for the following reasons: Direction of a multidisciplinary physical rehabilitation program to maximize functional independence : Yes Medical management of patient stability for increased activity during participation in an intensive rehabilitation  regime.: Yes Analysis of laboratory values and/or radiology reports with any subsequent need for medication adjustment and/or medical intervention. : Yes   I attest that I was present, lead the team conference, and concur with the assessment and plan of the team.   [name] 03/20/2019, 9:04 PM  Team conference was held via web/ teleconference due to [place] - 19

## 2019-03-20 NOTE — Progress Notes (Signed)
[facility] PHYSICAL MEDICINE & REHABILITATION PROGRESS NOTE  Subjective/Complaints: Patient seen sitting up in bed this AM.  She states she slept well overnight, confirmed with sleep chart. She notes soreness in back.   ROS: Denies CP, SOB, N/V/D  Objective: Vital Signs: Blood pressure (!) 141/91, pulse 97, temperature 98.2 F (36.8 C), resp. rate 17, height 5\' 5"  (1.651 m), weight 64.9 kg, SpO2 95 %. No results found. Recent Labs    03/18/19 0837  WBC 3.7*  HGB 13.2  HCT 40.7  PLT 215   Recent Labs    03/18/19 0837  NA 139  K 3.7  CL 103  CO2 26  GLUCOSE 178*  BUN 12  CREATININE 0.83  CALCIUM 9.4    Physical Exam: BP (!) 141/91 (BP Location: Right Arm)   Pulse 97   Temp 98.2 F (36.8 C)   Resp 17   Ht 5\' 5"  (1.651 m)   Wt 64.9 kg   SpO2 95%   BMI 23.81 kg/m  Constitutional: No distress . Vital signs reviewed. HENT: Normocephalic.  Atraumatic. Eyes: EOMI. No discharge. Cardiovascular: No JVD. Respiratory: Normal effort.  No stridor. GI: Non-distended. Skin: Warm and dry.  Intact. Psych: Normal mood.  Normal behavior. Musc: No edema in extremities.  No tenderness in extremities. No TTP back. Neurological:  Alert and oriented, except for date of month Motor:  Right upper extremity: 4/5 proximal to distal, unchanged Left upper extremities: 4-4+/5 proximal distal, unchanged Right lower extremity: 3+/5 proximal distal, stable Left lower extremity: 4/5 proximal to distal, stable  Assessment/Plan: 1. Functional deficits secondary to left cerebellar hematoma which require 3+ hours per day of interdisciplinary therapy in a comprehensive inpatient rehab setting.  Physiatrist is providing close team supervision and 24 hour management of active medical problems listed below.  Physiatrist and rehab team continue to assess barriers to discharge/monitor patient progress toward functional and medical goals  Care Tool:  Bathing    Body parts bathed by patient:  Right upper leg, Left upper leg, Face, Front perineal area, Abdomen, Chest, Left arm, Right arm   Body parts bathed by helper: Right lower leg, Left lower leg, Buttocks     Bathing assist Assist Level: Moderate Assistance - Patient 50 - 74%     Upper Body Dressing/Undressing Upper body dressing   What is the patient wearing?: Bra, Pull over shirt    Upper body assist Assist Level: Moderate Assistance - Patient 50 - 74%    Lower Body Dressing/Undressing Lower body dressing      What is the patient wearing?: Pants, Underwear/pull up     Lower body assist Assist for lower body dressing: Maximal Assistance - Patient 25 - 49%     Toileting Toileting    Toileting assist Assist for toileting: Maximal Assistance - Patient 25 - 49%     Transfers Chair/bed transfer  Transfers assist     Chair/bed transfer assist level: Moderate Assistance - Patient 50 - 74%     Locomotion Ambulation   Ambulation assist      Assist level: Minimal Assistance - Patient > 75% Assistive device: Walker-rolling Max distance: 40  ft   Walk 10 feet activity   Assist     Assist level: Minimal Assistance - Patient > 75% Assistive device: Walker-rolling   Walk 50 feet activity   Assist Walk 50 feet with 2 turns activity did not occur: Safety/medical concerns(Per report )         Walk 150 feet activity  Assist Walk 150 feet activity did not occur: Safety/medical concerns(Per report )         Walk 10 feet on uneven surface  activity   Assist Walk 10 feet on uneven surfaces activity did not occur: Safety/medical concerns(Per report)         Wheelchair     Assist   Type of Wheelchair: Manual    Wheelchair assist level: Minimal Assistance - Patient > 75% Max wheelchair distance: 25    Wheelchair 50 feet with 2 turns activity    Assist    Wheelchair 50 feet with 2 turns activity did not occur: Safety/medical concerns(fatigue)       Wheelchair 150  feet activity     Assist Wheelchair 150 feet activity did not occur: Safety/medical concerns(fatigue)          Medical Problem List and Plan: 1. Balance deficits with left lean as well as cognitive deficits affecting ADLs and mobility secondary to  left cerebellar hematoma with penetration of 4th ventricle and dependent within occipital horns and lateral ventricles  Cont CIR  Team conference today to discuss current and goals and coordination of care, home and environmental barriers, and discharge planning with nursing, case manager, and therapies.  2.  Antithrombotics: -DVT/anticoagulation:  Pharmaceutical: Lovenox             -antiplatelet therapy: N/A 3. HA/back pain/Pain Management: Continue tylenol prn  4. Mood: LCSW to follow for evaluation and support.              -antipsychotic agents: N/A 5. Neuropsych: This patient is not fully capable of making decisions on her own behalf. 6. Skin/Wound Care: Routine pressure relief measures.  Maintain adequate nutrition and hydration status. 7. Fluids/Electrolytes/Nutrition:      -Offer nutritional supplements  Poor intake on 10/28, however taking supplements   BMP within acceptable range on 10/26, except for glucose 8.  HTN: Monitor   Cardizem daily, decreased to 180 on 10/22   Labile on 10/28  Orthostatics positive on 10/28 9.  Macular degeneration: Visual hallucinations have been worsening.  Exacerbated by bilateral cataracts as well as elevated pressures left eye.    Started Xalatan (new RX per family)  72. Urinary retention:   Flomax started on 10/23, increased on 10/26  DC Foley today with voiding trial, discussed with patient  Continues to retain on 10/28 11. GERD: Continue Protonix. 12.  Hypoalbuminemia  Supplement initiated on 10/21 13. Constipation  Bowel meds increased on 10/21  Improving 14.  Post stroke dysphagia  D3 thins, advance as tolerated 15. FUO: Resolved  UA equivocal, Ucx <10k insig growth, wbc's 4.7,  afebrile since 10/22  Chest x-ray personally reviewed, unremarkable for infection 16.  Sleep disturbance  Sleep chart  Ambien started 10/23 with some benefit, increased on 10/27  Improving 17.  Prediabetes  Elevated on 10/26 18.  Leukopenia  WBCs 3.7 on 10/26  Continue to monitor  LOS: 8 days A FACE TO FACE EVALUATION WAS PERFORMED  Cyril Railey Lorie Phenix 03/20/2019, 10:52 AM

## 2019-03-20 NOTE — Progress Notes (Signed)
Speech Language Pathology Daily Session Note  Patient Details  Name: Beth Foster MRN: [id number] Date of Birth: 1928/11/20  Today's Date: 03/20/2019 SLP Individual Time: [id number] SLP Individual Time Calculation (min): 55 min  Short Term Goals: Week 2: SLP Short Term Goal 1 (Week 2): Pt will utilizie memory compensatory strategies to aid in recall of novel, daily information with mod A verbal cues. SLP Short Term Goal 2 (Week 2): Pt will complete functional basic problem solving tasks pertaining to ADLs with Supervision A verbal cues. SLP Short Term Goal 3 (Week 2): Pt will demonstrate self-awareness and self-correction of functional errors in problem solving tasks with min A verbal cues. SLP Short Term Goal 4 (Week 2): Pt will demonstrate selective attention in midly distracting environment for 15 minute intervals with Supervision A verbal cues for redirection. SLP Short Term Goal 5 (Week 2): Pt will demonstrate orientation x4 with Min A verbal cues. SLP Short Term Goal 6 (Week 2): Pt will demonstrate use of word finding strategies in structured and unstructured mildly complex tasks with Supervision A semantic cues.  Skilled Therapeutic Interventions: Pt was seen for skilled ST targeting cognitive skills. Upon entrance to room, pt found to be finishing BM with RN in restroom; SLP assisted pt with Min A verbal cues for sequencing to wash hands. Pt continues to be difficult to engage in therapy, as she is very fatigued and frequently states "my mind just isn't working" and "I just can't do this." SLP provided Max encouragement and rationale behind cognitive interventions. Pt oriented to self, place, situation, and month, however Min A verbal cues required for pt to orient to month. Due to visual impairment, pt verbally identified higher of 2 numbers during a novel basic card game with 90% accuracy and Min A verbal cues for basic problem solving. Mod-Max A verbal cues for problem solving provided for  pt to determine ending times of today's therapy appointments, given the start time and length of appt. SLP further facilitated session with Min A verbal cues for error awareness and basic problem solving as pt sequenced numbers on an extra large print October calendar. Of note, pt was more internally and externally distracted than previous sessions; Mod A verbal cues for redirection and environmental modifications required for sustained attention throughout session. Pt left sitting in wheelchair with seatbelt alarm in tact and all needs within reach. Continue per current plan of care.      Pain Pain Assessment Pain Scale: Faces Pain Score: 0-No pain Faces Pain Scale: No hurt  Therapy/Group: Individual Therapy  Arbutus Leas 03/20/2019, 9:42 AM

## 2019-03-20 NOTE — Progress Notes (Addendum)
Social Work Patient ID: Beth Foster, female   DOB: 09-Nov-1928, 85 y.o.   MRN: [id number]  Spoke with granddaughter to discuss team conference min-mod level and the amount of care she will require at discharge. Team feels they can work with another week then to work toward discharge plan, which will probably be NHP. Granddaughter would like the foley put back due to issues and fear of cath's.

## 2019-03-20 NOTE — Plan of Care (Signed)
  Problem: Consults Goal: RH STROKE PATIENT EDUCATION Description: See Patient Education module for education specifics  Outcome: Progressing   Problem: RH BOWEL ELIMINATION Goal: RH STG MANAGE BOWEL WITH ASSISTANCE Description: STG Manage Bowel with min Assistance. Outcome: Progressing Goal: RH STG MANAGE BOWEL W/MEDICATION W/ASSISTANCE Description: STG Manage Bowel with Medication with min Assistance. Outcome: Progressing   Problem: RH BLADDER ELIMINATION Goal: RH STG MANAGE BLADDER WITH ASSISTANCE Description: STG Manage Bladder With min Assistance Outcome: Progressing Goal: RH STG MANAGE BLADDER WITH MEDICATION WITH ASSISTANCE Description: STG Manage Bladder With Medication With min Assistance. Outcome: Progressing   Problem: RH SKIN INTEGRITY Goal: RH STG MAINTAIN SKIN INTEGRITY WITH ASSISTANCE Description: STG Maintain Skin Integrity With mod I Assistance. Outcome: Progressing   Problem: RH SAFETY Goal: RH STG DECREASED RISK OF FALL WITH ASSISTANCE Description: STG Decreased Risk of Fall With cues and reminder Assistance. Outcome: Progressing   Problem: RH COGNITION-NURSING Goal: RH STG ANTICIPATES NEEDS/CALLS FOR ASSIST W/ASSIST/CUES Description: STG Anticipates Needs/Calls for Assist With cues and reminders Assistance/Cues. Outcome: Progressing   Problem: RH PAIN MANAGEMENT Goal: RH STG PAIN MANAGED AT OR BELOW PT'S PAIN GOAL Description: Pain level less than 3 on scale of 0-10 Outcome: Progressing   Problem: RH KNOWLEDGE DEFICIT Goal: RH STG INCREASE KNOWLEDGE OF HYPERTENSION Description: Pt will be able to adhere to medication regimen for blood pressure control with min assist from family. Pt will demonstrate safety precaution to take upon discharge to prevent falls with cues and reminders from family. Outcome: Progressing   Problem: RH Vision Goal: RH LTG Vision (Specify) Outcome: Progressing

## 2019-03-20 NOTE — Progress Notes (Signed)
This Probation officer went to help with I/O cath of patient,. Patient was very anxious and crying throughout the whole process. Pt states that it hurts really bad, even when cath is no longer inserted. Pt states that she doesn't want to have I/O cath anymore. Pt states that it is very painful in that area.

## 2019-03-21 ENCOUNTER — Inpatient Hospital Stay (HOSPITAL_COMMUNITY): Payer: Medicare Other | Admitting: Occupational Therapy

## 2019-03-21 ENCOUNTER — Inpatient Hospital Stay (HOSPITAL_COMMUNITY): Payer: Medicare Other | Admitting: Speech Pathology

## 2019-03-21 ENCOUNTER — Inpatient Hospital Stay (HOSPITAL_COMMUNITY): Payer: Medicare Other | Admitting: Physical Therapy

## 2019-03-21 MED ORDER — BETHANECHOL CHLORIDE 10 MG PO TABS
10.0000 mg | ORAL_TABLET | Freq: Three times a day (TID) | ORAL | Status: DC
  Administered 2019-03-21 – 2019-03-22 (×4): 10 mg via ORAL
  Filled 2019-03-21 (×5): qty 1

## 2019-03-21 NOTE — Progress Notes (Signed)
[facility] PHYSICAL MEDICINE & REHABILITATION PROGRESS NOTE  Subjective/Complaints: Patient seen laying in bed this AM.  She states she slept well overnight, confirmed with sleep chart.  She denies complaints.   ROS: Denies CP, SOB, N/V/D  Objective: Vital Signs: Blood pressure 118/68, pulse 90, temperature 98.3 F (36.8 C), resp. rate 17, height 5\' 5"  (1.651 m), weight 64.9 kg, SpO2 93 %. No results found. No results for input(s): WBC, HGB, HCT, PLT in the last 72 hours. No results for input(s): NA, K, CL, CO2, GLUCOSE, BUN, CREATININE, CALCIUM in the last 72 hours.  Physical Exam: BP 118/68 (BP Location: Right Arm)   Pulse 90   Temp 98.3 F (36.8 C)   Resp 17   Ht 5\' 5"  (1.651 m)   Wt 64.9 kg   SpO2 93%   BMI 23.81 kg/m   Constitutional: No distress . Vital signs reviewed. HENT: Normocephalic.  Atraumatic. Eyes: EOMI. No discharge. Cardiovascular: No JVD. Respiratory: Normal effort.  No stridor. GI: Non-distended. Skin: Warm and dry.  Intact. Psych: Normal mood.  Normal behavior. Musc: No edema in extremities.  No tenderness in extremities.  Neurological:  Alert  Motor:  Right upper extremity: 4/5 proximal to distal, stable Left upper extremities: 4-4+/5 proximal distal, stable Right lower extremity: 3+/5 proximal distal, stable Left lower extremity: 4/5 proximal to distal, stable  Assessment/Plan: 1. Functional deficits secondary to left cerebellar hematoma which require 3+ hours per day of interdisciplinary therapy in a comprehensive inpatient rehab setting.  Physiatrist is providing close team supervision and 24 hour management of active medical problems listed below.  Physiatrist and rehab team continue to assess barriers to discharge/monitor patient progress toward functional and medical goals  Care Tool:  Bathing    Body parts bathed by patient: Right upper leg, Left upper leg, Face, Front perineal area, Abdomen, Chest, Left arm, Right arm   Body parts  bathed by helper: Buttocks, Right lower leg, Left lower leg     Bathing assist Assist Level: Moderate Assistance - Patient 50 - 74%     Upper Body Dressing/Undressing Upper body dressing   What is the patient wearing?: Bra, Pull over shirt    Upper body assist Assist Level: Moderate Assistance - Patient 50 - 74%    Lower Body Dressing/Undressing Lower body dressing      What is the patient wearing?: Pants, Underwear/pull up     Lower body assist Assist for lower body dressing: Maximal Assistance - Patient 25 - 49%     Toileting Toileting    Toileting assist Assist for toileting: Moderate Assistance - Patient 50 - 74%(balance when pulling pants over buttocks)     Transfers Chair/bed transfer  Transfers assist     Chair/bed transfer assist level: Minimal Assistance - Patient > 75%     Locomotion Ambulation   Ambulation assist      Assist level: Minimal Assistance - Patient > 75% Assistive device: Walker-rolling Max distance: 40  ft   Walk 10 feet activity   Assist     Assist level: Minimal Assistance - Patient > 75% Assistive device: Walker-rolling   Walk 50 feet activity   Assist Walk 50 feet with 2 turns activity did not occur: Safety/medical concerns(Per report )         Walk 150 feet activity   Assist Walk 150 feet activity did not occur: Safety/medical concerns(Per report )         Walk 10 feet on uneven surface  activity  Assist Walk 10 feet on uneven surfaces activity did not occur: Safety/medical concerns(Per report)         Wheelchair     Assist   Type of Wheelchair: Manual    Wheelchair assist level: Minimal Assistance - Patient > 75% Max wheelchair distance: 25    Wheelchair 50 feet with 2 turns activity    Assist    Wheelchair 50 feet with 2 turns activity did not occur: Safety/medical concerns(fatigue)       Wheelchair 150 feet activity     Assist Wheelchair 150 feet activity did not occur:  Safety/medical concerns(fatigue)          Medical Problem List and Plan: 1. Balance deficits with left lean as well as cognitive deficits affecting ADLs and mobility secondary to  left cerebellar hematoma with penetration of 4th ventricle and dependent within occipital horns and lateral ventricles  Cont CIR 2.  Antithrombotics: -DVT/anticoagulation:  Pharmaceutical: Lovenox             -antiplatelet therapy: N/A 3. HA/back pain/Pain Management: Continue tylenol prn  4. Mood: LCSW to follow for evaluation and support.              -antipsychotic agents: N/A 5. Neuropsych: This patient is not fully capable of making decisions on her own behalf. 6. Skin/Wound Care: Routine pressure relief measures.  Maintain adequate nutrition and hydration status. 7. Fluids/Electrolytes/Nutrition:      -Offer nutritional supplements  Poor intake on 10/29, however taking supplements   BMP within acceptable range on 10/26, except for glucose 8.  HTN: Monitor   Cardizem daily, decreased to 180 on 10/22   Labile on 10/29  Orthostatics positive on 10/28 9.  Macular degeneration: Visual hallucinations have been worsening.  Exacerbated by bilateral cataracts as well as elevated pressures left eye.    Started Xalatan (new RX per family)  61. Urinary retention:   Flomax started on 10/23, increased on 10/26  Bethanechol started on 10/29, will need to monitor BP in accordance  Continues to retain on 10/29 11. GERD: Continue Protonix. 12.  Hypoalbuminemia  Supplement initiated on 10/21 13. Constipation  Bowel meds increased on 10/21  Improving 14.  Post stroke dysphagia  D3 thins, advance as tolerated 15. FUO: Resolved  UA equivocal, Ucx <10k insig growth, wbc's 4.7, afebrile since 10/22  Chest x-ray personally reviewed, unremarkable for infection 16.  Sleep disturbance  Sleep chart  Ambien started 10/23 with some benefit, increased on 10/27  Improving 17.  Prediabetes  Elevated on 10/26 18.   Leukopenia  WBCs 3.7 on 10/26, labs ordered for tomorrow  Continue to monitor  LOS: 9 days A FACE TO FACE EVALUATION WAS PERFORMED  Nox Talent Lorie Phenix 03/21/2019, 1:11 PM

## 2019-03-21 NOTE — Progress Notes (Signed)
Physical Therapy Weekly Progress Note  Patient Details  Name: Beth Foster MRN: [id number] Date of Birth: 11-21-1928  Beginning of progress report period: March 13, 2019 End of progress report period: March 21, 2019  Today's Date: 03/21/2019 PT Individual Time: 2119-4174 PT Individual Time Calculation (min): 38 min   Patient has met 1 of 4 short term goals. Patient is making slow progress with therapy due to poor activity tolerance, intermittent orthostatic hypotension, and overall not feeling well this past week resulting in decreased participation in therapy sessions. Patient continues to require min/mod assist for bed mobility, transfers, and gait up to 16f using RW. Patient continues to demonstrate posterior lean in sitting and standing with delayed and inadequate balance recovery strategies.   Patient continues to demonstrate the following deficits muscle weakness, decreased cardiorespiratoy endurance, impaired timing and sequencing, unbalanced muscle activation, decreased coordination and decreased motor planning, decreased visual acuity, decreased visual perceptual skills and decreased visual motor skills, decreased awareness, decreased problem solving, decreased memory and delayed processing and decreased sitting balance, decreased standing balance, decreased postural control and decreased balance strategies and therefore will continue to benefit from skilled PT intervention to increase functional independence with mobility.  Patient not progressing toward long term goals. Continue plan of care at this time and determine if goals need to be adjusted at next progress note.  PT Short Term Goals Week 1:  PT Short Term Goal 1 (Week 1): pt will move sit> supine with supervision PT Short Term Goal 1 - Progress (Week 1): Progressing toward goal PT Short Term Goal 2 (Week 1): pt will transfer bed>< w/c with min assist PT Short Term Goal 2 - Progress (Week 1): Progressing toward goal PT  Short Term Goal 3 (Week 1): pt will propel w/c x 50' with min assist PT Short Term Goal 3 - Progress (Week 1): Progressing toward goal PT Short Term Goal 4 (Week 1): pt will perform gait with LRAD x 25' with mod assist PT Short Term Goal 4 - Progress (Week 1): Met Week 2:  PT Short Term Goal 1 (Week 2): Pt will perform supine<>sit consistently with CGA PT Short Term Goal 2 (Week 2): Pt will perform sit<>stands using LRAD with min assist consistently PT Short Term Goal 3 (Week 2): Pt will perform bed<>chair transfers using LRAD with min assist consistently PT Short Term Goal 4 (Week 2): Pt will ambulate 57fusing LRAD with min assist  Skilled Therapeutic Interventions/Progress Updates:  Ambulation/gait training;Cognitive remediation/compensation;Discharge planning;DME/adaptive equipment instruction;Pain management;Functional mobility training;Psychosocial support;Splinting/orthotics;Therapeutic Activities;UE/LE Strength taining/ROM;Visual/perceptual remediation/compensation;Balance/vestibular training;Community reintegration;Neuromuscular re-education;Patient/family education;Stair training;Therapeutic Exercise;UE/LE Coordination activities;Wheelchair propulsion/positioning;Skin care/wound management  Pt received supine in bed looking upset - upon questioning pt reports that she is hurting where nursing staff just performed in/out cath - therapist encouraged her to participate in session but pt sates "I just can't right now." Therapist questioned if pt would like for therapist to return in ~3012mtes and pt in agreement. Therapist assisted with pt rolling to L sidelying for improved pt comfort while she rested and pt left with needs in reach and bed alarm on.  Therapist returned to patient's room as agreed upon to find pt in L sidelying trying to answer her room phone but pt unable to figure out how. Pt requesting to call her granddaughter - therapist assisted; however, the granddaughter did not answer.  Pt continues to demonstrate confusion throughout session asking if someone slept in her room with her last night and asking if someone was outside -  therapist reoriented her to location. Therapist noticed pt's lunch try was sitting over on the dresser and that pt had not eaten. Pt agreeable to attempt sitting EOB to eat. Supine>sit, HOB partially elevated and using bedrails, with min assist for scooting hips towards EOB and intermittent CGA for trunk control due to posterior lean. Upon initially sitting up pt reports dizziness that dissipates with time. Sitting EOB with close supervision for trunk control pt ate ~6bites of her sweet potatoes and 1 bite of her [place] and despite therapist encouragement to continue eating pt deferred. Pt reporting she doesn't feel well and states she has a headache (holding L side of head) - RN notified and present for medication administration and notified of pt's little foot intake and overall not feeling well. Pt reporting need to use bathroom. Sit>stand EOB>RW with mod assist for lifting and balance due to L posterior lean/LOB - upon standing pt reports "I'm going in circles" and being dizzy. Stand pivot to w/c using RW with min/mod assist for balance and pivoting. Assessed vitals: BP 136/73 (MAP 90), HR 88bpm, SpO2 96%. Transported to/from bathroom in w/c. Stand pivot w/c<>BSC over toilet using grab bars with mod assist for lifting into standing and min assist for balance while turning. Standing with UE support on grab bars and min assist for balance therapist performed max assist LB clothing management. Despite sitting on BSC over toilet for ~3 minutes pt states "I'm not going to do anything" and requests for assistance back to bed. Transported back to room in w/c. Stand pivot w/c>EOB using RW with mod assist for lifting into standing and min assist for balance while turning - pt again reports dizziness in standing. Immediately upon sitting assessed vitals: BP 134/73 (MAP 91),  HR 88bpm, SpO2 96%.  Sit>supine with mod assist for B LE management. Educated pt on increasing her time in sidelying to allow pressure relief on sacrum. Pt in R sidelying with pillows therapeutically positioned for pressure relief and comfort with needs in reach and bed alarm on. Missed 37 minutes of skilled physical therapy.   Therapy Documentation Precautions:  Precautions Precautions: Fall Restrictions Weight Bearing Restrictions: No  Pain: Reports headache pain - RN notified and present for medication administration.   Therapy/Group: Individual Therapy  Tawana Scale, PT, DPT 03/21/2019, 12:54 PM

## 2019-03-21 NOTE — Plan of Care (Signed)
  Problem: Consults Goal: RH STROKE PATIENT EDUCATION Description: See Patient Education module for education specifics  Outcome: Progressing   Problem: RH BOWEL ELIMINATION Goal: RH STG MANAGE BOWEL WITH ASSISTANCE Description: STG Manage Bowel with min Assistance. Outcome: Progressing Goal: RH STG MANAGE BOWEL W/MEDICATION W/ASSISTANCE Description: STG Manage Bowel with Medication with min Assistance. Outcome: Progressing   Problem: RH BLADDER ELIMINATION Goal: RH STG MANAGE BLADDER WITH ASSISTANCE Description: STG Manage Bladder With min Assistance Outcome: Progressing Goal: RH STG MANAGE BLADDER WITH MEDICATION WITH ASSISTANCE Description: STG Manage Bladder With Medication With min Assistance. Outcome: Progressing   Problem: RH SKIN INTEGRITY Goal: RH STG MAINTAIN SKIN INTEGRITY WITH ASSISTANCE Description: STG Maintain Skin Integrity With mod I Assistance. Outcome: Progressing   Problem: RH SAFETY Goal: RH STG DECREASED RISK OF FALL WITH ASSISTANCE Description: STG Decreased Risk of Fall With cues and reminder Assistance. Outcome: Progressing   Problem: RH COGNITION-NURSING Goal: RH STG ANTICIPATES NEEDS/CALLS FOR ASSIST W/ASSIST/CUES Description: STG Anticipates Needs/Calls for Assist With cues and reminders Assistance/Cues. Outcome: Progressing   Problem: RH PAIN MANAGEMENT Goal: RH STG PAIN MANAGED AT OR BELOW PT'S PAIN GOAL Description: Pain level less than 3 on scale of 0-10 Outcome: Progressing   Problem: RH KNOWLEDGE DEFICIT Goal: RH STG INCREASE KNOWLEDGE OF HYPERTENSION Description: Pt will be able to adhere to medication regimen for blood pressure control with min assist from family. Pt will demonstrate safety precaution to take upon discharge to prevent falls with cues and reminders from family. Outcome: Progressing   Problem: RH Vision Goal: RH LTG Vision (Specify) Outcome: Progressing

## 2019-03-21 NOTE — Progress Notes (Signed)
Occupational Therapy Session Note  Patient Details  Name: Beth Foster MRN: [id number] Date of Birth: 1929-02-17  Today's Date: 03/21/2019 OT Individual Time: 8182-9937 OT Individual Time Calculation (min): 65 min (missed 10 min due to fatigue)   Short Term Goals: Week 1:  OT Short Term Goal 1 (Week 1): Pt will complete UB dressing with min assist for bra and button up shirt. OT Short Term Goal 1 - Progress (Week 1): Progressing toward goal OT Short Term Goal 2 (Week 1): Pt will complete LB bathing sit to stand with min assist. OT Short Term Goal 2 - Progress (Week 1): Progressing toward goal OT Short Term Goal 3 (Week 1): Pt will complete LB dressing sit to stand with min assist. OT Short Term Goal 3 - Progress (Week 1): Progressing toward goal OT Short Term Goal 4 (Week 1): Pt will ambulate to the toilet with min assist from wheelchari or EOB with RW. OT Short Term Goal 4 - Progress (Week 1): Progressing toward goal Week 2:  OT Short Term Goal 1 (Week 2): Continue working towards week 1 STGs.  Skilled Therapeutic Interventions/Progress Updates:    Pt received in bed stating she was very tired but ready to get dressed. Pt was able to sit to EOB, stand to RW, and ambulate into bathroom (15 ft) with min A.  Pt was able to pull down her underwear and sit on toilet with CGA.  She was able to void bowel and a small amount of urine.  Pt then stood and self cleansed with CGA.  She sat down and was out of breath stating she was very fatigued.  HR averaged 100 and O2 sats 96.  She rested for awhile and with a lot of extra time and rest breaks she was able to don pull up underwear and pants over feet.  In standing, increased posterior lean with cues to shift weight forward.  Pt continually complained of increased fatigue. She rested more, therapist assisted with zipper and button on pants.  Instead of having her walk back to recliner, used stand pivots to w/c and then to recliner with min A.  Pt sat in  recliner and adjusted with pillows. She kept saying she just needed to sleep.  Overall, much improved physical functioning and ability to complete tasks but increased fatigue with activity.  Pt resting in recliner with belt alarm on and all needs met.    Therapy Documentation Precautions:  Precautions Precautions: Fall Restrictions Weight Bearing Restrictions: No  General OT Amount of Missed Time: 10 Minutes    Pain: Pain Assessment Pain Scale: 0-10 Pain Score: 0-No pain ADL: ADL Eating: Set up Where Assessed-Eating: Edge of bed Grooming: Setup Where Assessed-Grooming: Wheelchair Upper Body Bathing: Supervision/safety Where Assessed-Upper Body Bathing: Other (Comment)(toilet) Lower Body Bathing: Contact guard Where Assessed-Lower Body Bathing: Other (Comment)(toilet) Upper Body Dressing: Minimal assistance Where Assessed-Upper Body Dressing: Other (Comment)(toilet) Lower Body Dressing: Minimal assistance Where Assessed-Lower Body Dressing: Other (Comment)(toilet) Toileting: Minimal assistance Where Assessed-Toileting: Glass blower/designer: Minimal Print production planner Method: Stand pivot, Insurance claims handler Equipment: Raised toilet seat  Therapy/Group: Individual Therapy  [place] 03/21/2019, 10:51 AM

## 2019-03-21 NOTE — Progress Notes (Signed)
Speech Language Pathology Daily Session Note  Patient Details  Name: Beth Foster MRN: [id number] Date of Birth: January 27, 1929  Today's Date: 03/21/2019 SLP Individual Time: [id number] SLP Individual Time Calculation (min): 43 min  Short Term Goals: Week 2: SLP Short Term Goal 1 (Week 2): Pt will utilizie memory compensatory strategies to aid in recall of novel, daily information with mod A verbal cues. SLP Short Term Goal 2 (Week 2): Pt will complete functional basic problem solving tasks pertaining to ADLs with Supervision A verbal cues. SLP Short Term Goal 3 (Week 2): Pt will demonstrate self-awareness and self-correction of functional errors in problem solving tasks with min A verbal cues. SLP Short Term Goal 4 (Week 2): Pt will demonstrate selective attention in midly distracting environment for 15 minute intervals with Supervision A verbal cues for redirection. SLP Short Term Goal 5 (Week 2): Pt will demonstrate orientation x4 with Min A verbal cues. SLP Short Term Goal 6 (Week 2): Pt will demonstrate use of word finding strategies in structured and unstructured mildly complex tasks with Supervision A semantic cues.  Skilled Therapeutic Interventions: Pt was seen for skilled ST targeting cognitive skills. Pt was very lethargic and required Max encouragement to participate throughout session. She also continues to be distracted by environmental noise (voices in hallway, wind against window); Min A verbal cues for redirection to tasks provided. Pt frequently stated "I just can't think" when she deemed a task too difficult to complete. Max A verbal cues for basic problem solving provided when calculating end times of therapy appointments from her daily schedule. She was more confused this morning than previous encounters with this SLP, as she required Total A to orient to place, Min A verbal question cues to orient to situation, although she was independently oriented to month. Mod A verbal cues  provided for sequencing, basic problem solving, and identification of safety precautions when verbally relaying steps of various transfers and ambulation with rolling walker (ex: going from sitting edge of bed to standing with walker, standing with walker to sitting on toilet, etc.). Multiple choice cues also required for basic problem solving repositioning herself to alleviate pressure due to positioning in recliner. Pt left sidelying in recliner with seatbelt alarm engaged and all needs within reach. Continue per current plan of care.       Pain Pain Assessment Pain Scale: 0-10 Pain Score: 5  Pain Type: Acute pain Pain Location: Head Pain Orientation: Mid Pain Descriptors / Indicators: Headache Pain Onset: Gradual Pain Intervention(s): RN made aware(pt did not want SLP to request medication from RN, but did make her aware of headache) Multiple Pain Sites: No  Therapy/Group: Individual Therapy  Beth Foster 03/21/2019, 12:17 PM

## 2019-03-22 ENCOUNTER — Inpatient Hospital Stay (HOSPITAL_COMMUNITY): Payer: Medicare Other | Admitting: Occupational Therapy

## 2019-03-22 ENCOUNTER — Inpatient Hospital Stay (HOSPITAL_COMMUNITY): Payer: Medicare Other | Admitting: Physical Therapy

## 2019-03-22 ENCOUNTER — Inpatient Hospital Stay (HOSPITAL_COMMUNITY): Payer: Medicare Other | Admitting: Speech Pathology

## 2019-03-22 LAB — CBC WITH DIFFERENTIAL/PLATELET
Abs Immature Granulocytes: 0.01 10*3/uL (ref 0.00–0.07)
Basophils Absolute: 0 10*3/uL (ref 0.0–0.1)
Basophils Relative: 1 %
Eosinophils Absolute: 0.1 10*3/uL (ref 0.0–0.5)
Eosinophils Relative: 3 %
HCT: 39.7 % (ref 36.0–46.0)
Hemoglobin: 12.7 g/dL (ref 12.0–15.0)
Immature Granulocytes: 0 %
Lymphocytes Relative: 36 %
Lymphs Abs: 1.5 10*3/uL (ref 0.7–4.0)
MCH: 31.8 pg (ref 26.0–34.0)
MCHC: 32 g/dL (ref 30.0–36.0)
MCV: 99.3 fL (ref 80.0–100.0)
Monocytes Absolute: 0.5 10*3/uL (ref 0.1–1.0)
Monocytes Relative: 11 %
Neutro Abs: 2.1 10*3/uL (ref 1.7–7.7)
Neutrophils Relative %: 49 %
Platelets: 218 10*3/uL (ref 150–400)
RBC: 4 MIL/uL (ref 3.87–5.11)
RDW: 13.2 % (ref 11.5–15.5)
WBC: 4.2 10*3/uL (ref 4.0–10.5)
nRBC: 0 % (ref 0.0–0.2)

## 2019-03-22 LAB — URINALYSIS, ROUTINE W REFLEX MICROSCOPIC
Bilirubin Urine: NEGATIVE
Glucose, UA: NEGATIVE mg/dL
Hgb urine dipstick: NEGATIVE
Ketones, ur: NEGATIVE mg/dL
Nitrite: NEGATIVE
Protein, ur: NEGATIVE mg/dL
Specific Gravity, Urine: 1.017 (ref 1.005–1.030)
WBC, UA: >50 WBC/hpf — ABNORMAL HIGH (ref 0–5)
pH: 5 (ref 5.0–8.0)

## 2019-03-22 MED ORDER — BETHANECHOL CHLORIDE 25 MG PO TABS
25.0000 mg | ORAL_TABLET | Freq: Three times a day (TID) | ORAL | Status: DC
  Administered 2019-03-22 – 2019-03-25 (×8): 25 mg via ORAL
  Filled 2019-03-22 (×8): qty 1

## 2019-03-22 NOTE — Progress Notes (Signed)
Occupational Therapy Session Note  Patient Details  Name: Beth Foster MRN: [id number] Date of Birth: [birth year]  Today's Date: 03/22/2019 OT Individual Time: 8144-8185 and 6314-9702 OT Individual Time Calculation (min): 58 min and 45 min   Short Term Goals: Week 1:  OT Short Term Goal 1 (Week 1): Pt will complete UB dressing with min assist for bra and button up shirt. OT Short Term Goal 1 - Progress (Week 1): Progressing toward goal OT Short Term Goal 2 (Week 1): Pt will complete LB bathing sit to stand with min assist. OT Short Term Goal 2 - Progress (Week 1): Progressing toward goal OT Short Term Goal 3 (Week 1): Pt will complete LB dressing sit to stand with min assist. OT Short Term Goal 3 - Progress (Week 1): Progressing toward goal OT Short Term Goal 4 (Week 1): Pt will ambulate to the toilet with min assist from wheelchari or EOB with RW. OT Short Term Goal 4 - Progress (Week 1): Progressing toward goal Week 2:  OT Short Term Goal 1 (Week 2): Continue working towards week 1 STGs.  Skilled Therapeutic Interventions/Progress Updates:    Visit 1: Pain:  No pain Pt received sitting on toilet with nurse in the room. Pt was voiding bowel and bladder.  Pt had liquidy stool.  Pt was able to rise to stand from low toilet with 1 hand on bar and 1 on RW with mod A. She stood with min -mod A to support her as she has a posterior lean. Cued pt to "glue big toes to the floor" to adjust posture.  She was able to thoroughly cleanse self with mod cues.  Pt completed donning underwear and pants from sitting on toilet needing CGA to pull over feet and mod A to support standing as she pulled her pants up.  Pt c/o intermittent dizziness.  BP WNL.  Pt ambulated to w/c in room and sat at sink to complete grooming.  Pt then practice sit to stand with towel wedge under heels to encourage more forward wt shift.  Pt then needed less A to support balance.  Ambulated to recliner with mod cues for positioning of  walker around furniture and turning walker to sit down.  Pt resting in recliner with all need met, belt alarm on, call light in reach.    Visit 2:  Pain: no c/o pain  Upon entering the room, pt was talking to herself saying "where am I?"  Reoriented pt by asking her specific questions such as "what does this room look like, yes it is a hospital, what is the name of the hospital" and so forth and she was able to answer the questions. Pt was fairly groggy this session. Pt worked on AROM of ankles, knees, and UE stretching to prepare for sit to stand exercises.  Place towel wedges under feet and pt practiced upright standing. She then ambulated with RW to toilet with min a.  Sat on toilet for a few minutes but unable to void.  Pt then expressed she was extremely tired and feeling dizzy. Nurse tech arrived and he assisted with providing 2nd person for support in case she needed to sit suddenly.  Pt was able to ambulate back to recliner but needed increased cues as she would continually push the walker off to the side.  Pt set up in recliner with belt alarm on. NT in room with pt.   Therapy Documentation Precautions:  Precautions Precautions: Fall Restrictions Weight Bearing Restrictions:  No   Vital Signs: 131/67 in sitting   Pain: Pain Assessment Pain Scale: 0-10 Pain Score: 0-No pain ADL: ADL Eating: Set up Where Assessed-Eating: Edge of bed Grooming: Setup Where Assessed-Grooming: Wheelchair Upper Body Bathing: Supervision/safety Where Assessed-Upper Body Bathing: Other (Comment)(toilet) Lower Body Bathing: Contact guard Where Assessed-Lower Body Bathing: Other (Comment)(toilet) Upper Body Dressing: Minimal assistance Where Assessed-Upper Body Dressing: Other (Comment)(toilet) Lower Body Dressing: Minimal assistance Where Assessed-Lower Body Dressing: Other (Comment)(toilet) Toileting: Minimal assistance Where Assessed-Toileting: Glass blower/designer: Minimal Corporate investment banker Method: Stand pivot, Insurance claims handler Equipment: Raised toilet seat  Therapy/Group: Individual Therapy  [place] 03/22/2019, 8:41 AM

## 2019-03-22 NOTE — Progress Notes (Signed)
Physical Therapy Session Note  Patient Details  Name: Beth Foster MRN: [id number] Date of Birth: 12-07-1928  Today's Date: 03/22/2019 PT Individual Time: 1430-1500 PT Individual Time Calculation (min): 30 min   Short Term Goals: Week 2:  PT Short Term Goal 1 (Week 2): Pt will perform supine<>sit consistently with CGA PT Short Term Goal 2 (Week 2): Pt will perform sit<>stands using LRAD with min assist consistently PT Short Term Goal 3 (Week 2): Pt will perform bed<>chair transfers using LRAD with min assist consistently PT Short Term Goal 4 (Week 2): Pt will ambulate 46ft using LRAD with min assist  Skilled Therapeutic Interventions/Progress Updates:    Pt received seated in recliner in room, agreeable to makeup therapy session. Pt reports urge to use the bathroom. Sit to stand with min A to RW, posterior lean in standing. Ambulation to bathroom with RW and min A, max cueing for safe RW management as pt tends to veer to the L. Toilet transfer with mod A with max A for clothing management. Pt unable to void on toilet. Ambulation back to recliner with min A and RW. Pt requested to look at family photos that are sitting in windowsill. Engaged pt in meaningful conversation and memory task having her name family members that are pictured. Pt becomes emotional due to visual deficits and inability to identify everyone pictured, provided emotional support. Pt left seated in recliner in room with needs in reach, quick release belt and chair alarm in place.  Therapy Documentation Precautions:  Precautions Precautions: Fall Restrictions Weight Bearing Restrictions: No    Therapy/Group: Individual Therapy   Excell Seltzer, PT, DPT  03/22/2019, 3:42 PM

## 2019-03-22 NOTE — Progress Notes (Signed)
[place] PHYSICAL MEDICINE & REHABILITATION PROGRESS NOTE  Subjective/Complaints: Patient seen sitting up in her bed, eating breakfast, working with therapies.  She states she slept well overnight, sleep chart not updated.  Discussed endurance with therapies, which is improving.  She states she does not like being cathed.  She states she hopes her granddaughter comes to visit today.  ROS: Denies CP, SOB, N/V/D  Objective: Vital Signs: Blood pressure 123/70, pulse 88, temperature 98.2 F (36.8 C), resp. rate 17, height 5\' 5"  (1.651 m), weight 64.9 kg, SpO2 96 %. No results found. Recent Labs    03/22/19 0452  WBC 4.2  HGB 12.7  HCT 39.7  PLT 218   No results for input(s): NA, K, CL, CO2, GLUCOSE, BUN, CREATININE, CALCIUM in the last 72 hours.  Physical Exam: BP 123/70 (BP Location: Right Arm)   Pulse 88   Temp 98.2 F (36.8 C)   Resp 17   Ht 5\' 5"  (1.651 m)   Wt 64.9 kg   SpO2 96%   BMI 23.81 kg/m   Constitutional: No distress . Vital signs reviewed. HENT: Normocephalic.  Atraumatic. Eyes: EOMI. No discharge. Cardiovascular: No JVD. Respiratory: Normal effort.  No stridor. GI: Non-distended. Skin: Warm and dry.  Intact. Psych: Normal mood.  Normal behavior. Musc: No edema in extremities.  No tenderness in extremities. Neurological:  Alert Motor:  Right upper extremity: 4/5 proximal to distal, unchanged Left upper extremities: 4-4+/5 proximal distal, unchanged Right lower extremity: 4-/5 proximal distal Left lower extremity: 4/5 proximal to distal, stable  Assessment/Plan: 1. Functional deficits secondary to left cerebellar hematoma which require 3+ hours per day of interdisciplinary therapy in a comprehensive inpatient rehab setting.  Physiatrist is providing close team supervision and 24 hour management of active medical problems listed below.  Physiatrist and rehab team continue to assess barriers to discharge/monitor patient progress toward functional and  medical goals  Care Tool:  Bathing    Body parts bathed by patient: Right upper leg, Left upper leg, Face, Front perineal area, Abdomen, Chest, Left arm, Right arm, Buttocks   Body parts bathed by helper: Right lower leg, Left lower leg     Bathing assist Assist Level: Minimal Assistance - Patient > 75%     Upper Body Dressing/Undressing Upper body dressing   What is the patient wearing?: Bra, Button up shirt    Upper body assist Assist Level: Moderate Assistance - Patient 50 - 74%    Lower Body Dressing/Undressing Lower body dressing      What is the patient wearing?: Pants, Underwear/pull up     Lower body assist Assist for lower body dressing: Moderate Assistance - Patient 50 - 74%     Toileting Toileting    Toileting assist Assist for toileting: Moderate Assistance - Patient 50 - 74%     Transfers Chair/bed transfer  Transfers assist     Chair/bed transfer assist level: Minimal Assistance - Patient > 75%     Locomotion Ambulation   Ambulation assist      Assist level: Minimal Assistance - Patient > 75% Assistive device: Walker-rolling Max distance: 40  ft   Walk 10 feet activity   Assist     Assist level: Minimal Assistance - Patient > 75% Assistive device: Walker-rolling   Walk 50 feet activity   Assist Walk 50 feet with 2 turns activity did not occur: Safety/medical concerns(Per report )         Walk 150 feet activity   Assist Walk 150  feet activity did not occur: Safety/medical concerns(Per report )         Walk 10 feet on uneven surface  activity   Assist Walk 10 feet on uneven surfaces activity did not occur: Safety/medical concerns(Per report)         Wheelchair     Assist   Type of Wheelchair: Manual    Wheelchair assist level: Minimal Assistance - Patient > 75% Max wheelchair distance: 25    Wheelchair 50 feet with 2 turns activity    Assist    Wheelchair 50 feet with 2 turns activity did not  occur: Safety/medical concerns(fatigue)       Wheelchair 150 feet activity     Assist Wheelchair 150 feet activity did not occur: Safety/medical concerns(fatigue)          Medical Problem List and Plan: 1. Balance deficits with left lean as well as cognitive deficits affecting ADLs and mobility secondary to  left cerebellar hematoma with penetration of 4th ventricle and dependent within occipital horns and lateral ventricles  Continue CIR 2.  Antithrombotics: -DVT/anticoagulation:  Pharmaceutical: Lovenox             -antiplatelet therapy: N/A 3. HA/back pain/Pain Management: Continue tylenol prn  4. Mood: LCSW to follow for evaluation and support.              -antipsychotic agents: N/A 5. Neuropsych: This patient is not fully capable of making decisions on her own behalf. 6. Skin/Wound Care: Routine pressure relief measures.  Maintain adequate nutrition and hydration status. 7. Fluids/Electrolytes/Nutrition:      -Offer nutritional supplements  Poor intake on 10/29, however taking supplements   BMP within acceptable range on 10/26, except for glucose, labs ordered for Monday 8.  HTN: Monitor   Cardizem daily, decreased to 180 on 10/22   Controlled on 10/30  Orthostatics positive on 10/28 9.  Macular degeneration: Visual hallucinations have been worsening.  Exacerbated by bilateral cataracts as well as elevated pressures left eye.    Started Xalatan (new RX per family)  37. Urinary retention:   Flomax started on 10/23, increased on 10/26  Bethanechol started on 10/29, will need to monitor BP in accordance  Continues to retain on 10/29 11. GERD: Continue Protonix. 12.  Hypoalbuminemia  Supplement initiated on 10/21 13. Constipation, now with incontinence  Bowel meds increased on 10/21  Improving 14.  Post stroke dysphagia  D3 thins, advance as tolerated 15. FUO: Resolved  UA equivocal, Ucx <10k insig growth, wbc's 4.7, afebrile since 10/22  Chest x-ray personally  reviewed, unremarkable for infection 16.  Sleep disturbance  Sleep chart  Ambien started 10/23 with some benefit, increased on 10/27  Improving 17.  Prediabetes  Elevated on 10/26 18.  Leukopenia: Resolved  WBCs 4.2 on 10/28  Continue to monitor  LOS: 10 days A FACE TO FACE EVALUATION WAS PERFORMED  Gaytha Raybourn Lorie Phenix 03/22/2019, 2:51 PM

## 2019-03-22 NOTE — Progress Notes (Signed)
Speech Language Pathology Daily Session Note  Patient Details  Name: Beth Foster MRN: [id number] Date of Birth: May 30, 1928  Today's Date: 03/22/2019 SLP Individual Time: 0730-0825 SLP Individual Time Calculation (min): 55 min  Short Term Goals: Week 2: SLP Short Term Goal 1 (Week 2): Pt will utilizie memory compensatory strategies to aid in recall of novel, daily information with mod A verbal cues. SLP Short Term Goal 2 (Week 2): Pt will complete functional basic problem solving tasks pertaining to ADLs with Supervision A verbal cues. SLP Short Term Goal 3 (Week 2): Pt will demonstrate self-awareness and self-correction of functional errors in problem solving tasks with min A verbal cues. SLP Short Term Goal 4 (Week 2): Pt will demonstrate selective attention in midly distracting environment for 15 minute intervals with Supervision A verbal cues for redirection. SLP Short Term Goal 5 (Week 2): Pt will demonstrate orientation x4 with Min A verbal cues. SLP Short Term Goal 6 (Week 2): Pt will demonstrate use of word finding strategies in structured and unstructured mildly complex tasks with Supervision A semantic cues.  Skilled Therapeutic Interventions: Pt was seen for skilled ST targeting cognitive goals. SLP facilitated session with set up and Min A for basic problem solving as well a locating items on tray. Difficulty locating items believed to be due to severe visual impairments. Some assistance with self feeding also required. Pt was oriented X4 with only Supervision A verbal cues this morning. Although initially distracted by noise and voices in the hallway, cueing for redirection to tasks able to fade from Min to Supervision A ~half way through session. Min A verbal and visual cues for error awareness and problem solving provided when matching colored blocks to a 3-4 color pattern board. During session, SLP and RN also assisted pt in transferring from bed to recliner (stand pivot with RW)  with Min A verbal cues for sequencing. Pt left sitting in recliner with seatbelt alarm engaged and all needs within reach. Continue per current plan of care.      Pain Pain Assessment Pain Scale: 0-10 Pain Score: 0-No pain  Therapy/Group: Individual Therapy  Arbutus Leas 03/22/2019, 9:52 AM

## 2019-03-22 NOTE — Progress Notes (Signed)
Physical Therapy Session Note  Patient Details  Name: Beth Foster MRN: [id number] Date of Birth: 04/21/1929  Today's Date: 03/22/2019 PT Individual Time: 1300-1355 PT Individual Time Calculation (min): 55 min   Short Term Goals: Week 1:  PT Short Term Goal 1 (Week 1): pt will move sit> supine with supervision PT Short Term Goal 1 - Progress (Week 1): Progressing toward goal PT Short Term Goal 2 (Week 1): pt will transfer bed>< w/c with min assist PT Short Term Goal 2 - Progress (Week 1): Progressing toward goal PT Short Term Goal 3 (Week 1): pt will propel w/c x 50' with min assist PT Short Term Goal 3 - Progress (Week 1): Progressing toward goal PT Short Term Goal 4 (Week 1): pt will perform gait with LRAD x 25' with mod assist PT Short Term Goal 4 - Progress (Week 1): Met Week 2:  PT Short Term Goal 1 (Week 2): Pt will perform supine<>sit consistently with CGA PT Short Term Goal 2 (Week 2): Pt will perform sit<>stands using LRAD with min assist consistently PT Short Term Goal 3 (Week 2): Pt will perform bed<>chair transfers using LRAD with min assist consistently PT Short Term Goal 4 (Week 2): Pt will ambulate 50ft using LRAD with min assist  Skilled Therapeutic Interventions/Progress Updates:  Pt presents in recliner upon arrival for therapy. Pt agreeable to treatment. Pt ambulated 6 feet from recliner>wheelchair with RW and modA. Pt transported in wheelchair to day room. Pt appeared to be hallucinating when pushed into the hallway. Pt stated there were too many people in the hallway when hallway was really empty. Pt's vitals assessed: BP: 131/66 HR: 91bpm O2: 97% Pt completed sit>stand and sustained standing with RW and minA for Connect Four game. Pt required verbal cueing for instructions of Connect Four game in addition to increasing BOS and weightshifting anteriorly for stability. Pt stated she was too dizzy to keep standing and required seated rest break. Vitals assessed: BP: 133/67  HR: 91bpm O2: 97% Pt completed sit>stand with RW and minA and remained standing to complete game of Connect Four. Pt again stated she felt dizzy and her head was heavy. Also pt stated she saw dogs by the treadmill in the dayroom when really there were no dogs present. Pt transported from day room>room. When arriving at room, pt stated that there are people in her bed when really there was no one in her bed except her sweater and teddy bear. Pt ambulated 10 feet from wheelchair>recliner. Pt positioned in recliner, call bell within reach, chair exit on. All needs met at this time. Post PT session, PT and SPT spoke with RN and PA about hallucinations in addition to dizziness and lower back/flank pain during therapy session today.  Therapy Documentation Precautions:  Precautions Precautions: Fall Restrictions Weight Bearing Restrictions: No  Pain: low back/flank pain, RN and PA made aware of this   Therapy/Group: Individual Therapy   , SPT  03/22/2019, 3:41 PM  

## 2019-03-23 ENCOUNTER — Inpatient Hospital Stay (HOSPITAL_COMMUNITY): Payer: Medicare Other | Admitting: Occupational Therapy

## 2019-03-23 NOTE — Progress Notes (Signed)
Granddaughter Vicente Males (patients POA) brought health Care Power of Attorney/living will documents to the unit 10/31. Nurse placed documents in the chart.

## 2019-03-23 NOTE — Progress Notes (Signed)
Occupational Therapy Session Note  Patient Details  Name: Beth Foster MRN: [id number] Date of Birth: 11-23-1928  Today's Date: 03/23/2019 OT Individual Time: [id number] OT Individual Time Calculation (min): 35 min  25 minutes missed  Short Term Goals: Week 2:  OT Short Term Goal 1 (Week 2): Continue working towards week 1 STGs.    Skilled Therapeutic Interventions/Progress Updates:    Pt greeted in bed, initially refusing tx due to fatigue. Agreeable to try to use the toilet. Supine<sit completed with supervision using the bedrail. Mod A for sit<stand with RW with vcs for hand placement. Min A for ambulatory transfer into the bathroom using device. While she sat to void, provided pt with manual cuing to increase awareness of Lt lean. She was ultimately unable to void, but agreeable to complete pericare due to incontinence in brief. Mod A for standing balance while she completed hygiene in the front. OT assisted with hygiene in the back. Pt was able to thread both LEs into pull ups with vcs and balance assist. Mod A for lifting assistance during power ups from low toilet. Pt declined to sit up in w/c or complete oral care/BADL tasks, stating she was too tired and wanted to lie back down. She side-stepped towards [facility] using RW with manual facilitation for DME mgt. Mod A for transition to supine. Positioned pt in sidelying for pressure relief and pain mgt (buttocks). After she was comfortable, we engaged in a conversation about family Halloween memories for orientation. Pt disoriented to year but oriented to place and situation. She was left with all needs and bed alarm set. Time missed due to fatigue. Tx focus placed on ADL retraining, functional ambulation, dynamic balance, and activity tolerance.     Therapy Documentation Precautions:  Precautions Precautions: Fall Restrictions Weight Bearing Restrictions: No Vital Signs: Therapy Vitals Temp: 98.2 F (36.8 C) Temp Source: Oral Pulse Rate:  78 Resp: 18 BP: (!) 101/50 Patient Position (if appropriate): Lying Oxygen Therapy SpO2: 96 % O2 Device: Room Air Pain: Buttocks pain, repositioning strategies used to address at end of session    ADL: ADL Eating: Set up Where Assessed-Eating: Edge of bed Grooming: Setup Where Assessed-Grooming: Wheelchair Upper Body Bathing: Supervision/safety Where Assessed-Upper Body Bathing: Other (Comment)(toilet) Lower Body Bathing: Contact guard Where Assessed-Lower Body Bathing: Other (Comment)(toilet) Upper Body Dressing: Minimal assistance Where Assessed-Upper Body Dressing: Other (Comment)(toilet) Lower Body Dressing: Minimal assistance Where Assessed-Lower Body Dressing: Other (Comment)(toilet) Toileting: Minimal assistance Where Assessed-Toileting: Glass blower/designer: Minimal Print production planner Method: Stand pivot, Insurance claims handler Equipment: Raised toilet seat     Therapy/Group: Individual Therapy  Tanuj Mullens A Shermar Friedland 03/23/2019, 4:09 PM

## 2019-03-23 NOTE — Progress Notes (Signed)
[place] PHYSICAL MEDICINE & REHABILITATION PROGRESS NOTE  Subjective/Complaints:   Pt reports she just got woken up by staff which upset her- she was sleeping well- she's upset because she doesn't like to be cathed, but was unable to void just now.  Ate <50% of breakfast this AM so far- said because she's "not able to see it". .  ROS: Denies CP, SOB, N/V/D  Objective: Vital Signs: Blood pressure 119/70, pulse 80, temperature 98.2 F (36.8 C), resp. rate 19, height 5\' 5"  (1.651 m), weight 64.9 kg, SpO2 95 %. No results found. Recent Labs    03/22/19 0452  WBC 4.2  HGB 12.7  HCT 39.7  PLT 218   No results for input(s): NA, K, CL, CO2, GLUCOSE, BUN, CREATININE, CALCIUM in the last 72 hours.  Physical Exam: BP 119/70 (BP Location: Left Arm)   Pulse 80   Temp 98.2 F (36.8 C)   Resp 19   Ht 5\' 5"  (1.651 m)   Wt 64.9 kg   SpO2 95%   BMI 23.81 kg/m   Constitutional: No distress . Vital signs reviewed. Sitting up in bed; distraught about needing cathing, NAD HENT: Normocephalic.  Atraumatic. Eyes: EOMI. No discharge. Cardiovascular: No JVD. Respiratory: Normal effort.  No stridor. GI: Non-distended. Skin: Warm and dry.  Intact. Psych: Normal mood.  Normal behavior. Musc: No edema in extremities.  No tenderness in extremities. Neurological:  Alert Motor:  Right upper extremity: 4/5 proximal to distal, unchanged Left upper extremities: 4-4+/5 proximal distal, unchanged Right lower extremity: 4-/5 proximal distal Left lower extremity: 4/5 proximal to distal, stable  Assessment/Plan: 1. Functional deficits secondary to left cerebellar hematoma which require 3+ hours per day of interdisciplinary therapy in a comprehensive inpatient rehab setting.  Physiatrist is providing close team supervision and 24 hour management of active medical problems listed below.  Physiatrist and rehab team continue to assess barriers to discharge/monitor patient progress toward  functional and medical goals  Care Tool:  Bathing    Body parts bathed by patient: Right upper leg, Left upper leg, Face, Front perineal area, Abdomen, Chest, Left arm, Right arm, Buttocks   Body parts bathed by helper: Right lower leg, Left lower leg     Bathing assist Assist Level: Minimal Assistance - Patient > 75%     Upper Body Dressing/Undressing Upper body dressing   What is the patient wearing?: Bra, Button up shirt    Upper body assist Assist Level: Moderate Assistance - Patient 50 - 74%    Lower Body Dressing/Undressing Lower body dressing      What is the patient wearing?: Pants, Underwear/pull up     Lower body assist Assist for lower body dressing: Moderate Assistance - Patient 50 - 74%     Toileting Toileting    Toileting assist Assist for toileting: Moderate Assistance - Patient 50 - 74%     Transfers Chair/bed transfer  Transfers assist     Chair/bed transfer assist level: Minimal Assistance - Patient > 75%     Locomotion Ambulation   Ambulation assist      Assist level: Minimal Assistance - Patient > 75% Assistive device: Walker-rolling Max distance: 15 feet   Walk 10 feet activity   Assist     Assist level: Minimal Assistance - Patient > 75% Assistive device: Walker-rolling   Walk 50 feet activity   Assist Walk 50 feet with 2 turns activity did not occur: Safety/medical concerns(Per report )  Walk 150 feet activity   Assist Walk 150 feet activity did not occur: Safety/medical concerns(Per report )         Walk 10 feet on uneven surface  activity   Assist Walk 10 feet on uneven surfaces activity did not occur: Safety/medical concerns(Per report)         Wheelchair     Assist   Type of Wheelchair: Manual    Wheelchair assist level: Minimal Assistance - Patient > 75% Max wheelchair distance: 25    Wheelchair 50 feet with 2 turns activity    Assist    Wheelchair 50 feet with 2 turns  activity did not occur: Safety/medical concerns(fatigue)       Wheelchair 150 feet activity     Assist Wheelchair 150 feet activity did not occur: Safety/medical concerns(fatigue)          Medical Problem List and Plan: 1. Balance deficits with left lean as well as cognitive deficits affecting ADLs and mobility secondary to  left cerebellar hematoma with penetration of 4th ventricle and dependent within occipital horns and lateral ventricles  Continue CIR 2.  Antithrombotics: -DVT/anticoagulation:  Pharmaceutical: Lovenox             -antiplatelet therapy: N/A 3. HA/back pain/Pain Management: Continue tylenol prn  4. Mood: LCSW to follow for evaluation and support.              -antipsychotic agents: N/A 5. Neuropsych: This patient is not fully capable of making decisions on her own behalf. 6. Skin/Wound Care: Routine pressure relief measures.  Maintain adequate nutrition and hydration status. 7. Fluids/Electrolytes/Nutrition:      -Offer nutritional supplements  Poor intake on 10/29, however taking supplements   BMP within acceptable range on 10/26, except for glucose, labs ordered for Monday 8.  HTN: Monitor   Cardizem daily, decreased to 180 on 10/22   Controlled on 10/30  Orthostatics positive on 10/28 9.  Macular degeneration: Visual hallucinations have been worsening.  Exacerbated by bilateral cataracts as well as elevated pressures left eye.    Started Xalatan (new RX per family)  63. Urinary retention:   Flomax started on 10/23, increased on 10/26  Bethanechol started on 10/29, will need to monitor BP in accordance  Continues to retain on 10/31- has Flomax and Lidocaine 11. GERD: Continue Protonix. 12.  Hypoalbuminemia  Supplement initiated on 10/21 13. Constipation, now with incontinence  Bowel meds increased on 10/21  Improving 14.  Post stroke dysphagia  D3 thins, advance as tolerated 15. FUO: Resolved  UA equivocal, Ucx <10k insig growth, wbc's 4.7,  afebrile since 10/22  Chest x-ray personally reviewed, unremarkable for infection 16.  Sleep disturbance  Sleep chart  Ambien started 10/23 with some benefit, increased on 10/27  Improving 17.  Prediabetes  Elevated on 10/26 18.  Leukopenia: Resolved  WBCs 4.2 on 10/28  Continue to monitor  LOS: 11 days A FACE TO FACE EVALUATION WAS PERFORMED  Rosaleah Person 03/23/2019, 1:31 PM

## 2019-03-24 ENCOUNTER — Inpatient Hospital Stay (HOSPITAL_COMMUNITY): Payer: Medicare Other | Admitting: Speech Pathology

## 2019-03-24 ENCOUNTER — Inpatient Hospital Stay (HOSPITAL_COMMUNITY): Payer: Medicare Other | Admitting: Occupational Therapy

## 2019-03-24 NOTE — Progress Notes (Signed)
[place] PHYSICAL MEDICINE & REHABILITATION PROGRESS NOTE  Subjective/Complaints:   Pt reports hates the in/out caths- is painful even with lidocaine.  Had BM this AM and voided "a small amount" but explained still has to drain bladder somehow.  Also is very cold- room set at 69 degrees- increased to 71 degrees.  Ate 30% of breakfast base don tray.  ROS: Denies CP, SOB, N/V/D  Objective: Vital Signs: Blood pressure 119/66, pulse 75, temperature 97.6 F (36.4 C), resp. rate 16, height 5\' 5"  (1.651 m), weight 64.9 kg, SpO2 95 %. No results found. Recent Labs    03/22/19 0452  WBC 4.2  HGB 12.7  HCT 39.7  PLT 218   No results for input(s): NA, K, CL, CO2, GLUCOSE, BUN, CREATININE, CALCIUM in the last 72 hours.  Physical Exam: BP 119/66 (BP Location: Right Arm)   Pulse 75   Temp 97.6 F (36.4 C)   Resp 16   Ht 5\' 5"  (1.651 m)   Wt 64.9 kg   SpO2 95%   BMI 23.81 kg/m   Constitutional: No distress . Vital signs reviewed. Sitting up in bed; distraught about needing cathing, cold, under multiple blankets; room cool, NAD HENT: Normocephalic.  Atraumatic. Eyes: EOMI. No discharge. Cardiovascular: No JVD. Respiratory: Normal effort.  No stridor. GI: Non-distended. Skin: cool and dry.  Intact. Psych: Normal mood.  Normal behavior. Musc: No edema in extremities.  No tenderness in extremities. Neurological: distraught about in/out caths Alert Motor:  Right upper extremity: 4/5 proximal to distal, unchanged Left upper extremities: 4-4+/5 proximal distal, unchanged Right lower extremity: 4-/5 proximal distal Left lower extremity: 4/5 proximal to distal, stable  Assessment/Plan: 1. Functional deficits secondary to left cerebellar hematoma which require 3+ hours per day of interdisciplinary therapy in a comprehensive inpatient rehab setting.  Physiatrist is providing close team supervision and 24 hour management of active medical problems listed below.  Physiatrist  and rehab team continue to assess barriers to discharge/monitor patient progress toward functional and medical goals  Care Tool:  Bathing    Body parts bathed by patient: Right upper leg, Left upper leg, Face, Front perineal area, Abdomen, Chest, Left arm, Right arm, Buttocks   Body parts bathed by helper: Right lower leg, Left lower leg     Bathing assist Assist Level: Minimal Assistance - Patient > 75%     Upper Body Dressing/Undressing Upper body dressing   What is the patient wearing?: Bra, Button up shirt    Upper body assist Assist Level: Moderate Assistance - Patient 50 - 74%    Lower Body Dressing/Undressing Lower body dressing      What is the patient wearing?: Pants, Underwear/pull up     Lower body assist Assist for lower body dressing: Moderate Assistance - Patient 50 - 74%     Toileting Toileting    Toileting assist Assist for toileting: Moderate Assistance - Patient 50 - 74%     Transfers Chair/bed transfer  Transfers assist     Chair/bed transfer assist level: Minimal Assistance - Patient > 75%     Locomotion Ambulation   Ambulation assist      Assist level: Minimal Assistance - Patient > 75% Assistive device: Walker-rolling Max distance: 15 feet   Walk 10 feet activity   Assist     Assist level: Minimal Assistance - Patient > 75% Assistive device: Walker-rolling   Walk 50 feet activity   Assist Walk 50 feet with 2 turns activity did not occur: Safety/medical concerns(Per  report )         Walk 150 feet activity   Assist Walk 150 feet activity did not occur: Safety/medical concerns(Per report )         Walk 10 feet on uneven surface  activity   Assist Walk 10 feet on uneven surfaces activity did not occur: Safety/medical concerns(Per report)         Wheelchair     Assist   Type of Wheelchair: Manual    Wheelchair assist level: Minimal Assistance - Patient > 75% Max wheelchair distance: 25     Wheelchair 50 feet with 2 turns activity    Assist    Wheelchair 50 feet with 2 turns activity did not occur: Safety/medical concerns(fatigue)       Wheelchair 150 feet activity     Assist Wheelchair 150 feet activity did not occur: Safety/medical concerns(fatigue)          Medical Problem List and Plan: 1. Balance deficits with left lean as well as cognitive deficits affecting ADLs and mobility secondary to  left cerebellar hematoma with penetration of 4th ventricle and dependent within occipital horns and lateral ventricles  Continue CIR 2.  Antithrombotics: -DVT/anticoagulation:  Pharmaceutical: Lovenox             -antiplatelet therapy: N/A 3. HA/back pain/Pain Management: Continue tylenol prn  4. Mood: LCSW to follow for evaluation and support.              -antipsychotic agents: N/A 5. Neuropsych: This patient is not fully capable of making decisions on her own behalf. 6. Skin/Wound Care: Routine pressure relief measures.  Maintain adequate nutrition and hydration status. 7. Fluids/Electrolytes/Nutrition:      -Offer nutritional supplements  Poor intake on 10/29, however taking supplements   BMP within acceptable range on 10/26, except for glucose, labs ordered for Monday 8.  HTN: Monitor   Cardizem daily, decreased to 180 on 10/22   Controlled on 10/30  Orthostatics positive on 10/28 9.  Macular degeneration: Visual hallucinations have been worsening.  Exacerbated by bilateral cataracts as well as elevated pressures left eye.    Started Xalatan (new RX per family)  35. Urinary retention:   Flomax started on 10/23, increased on 10/26  Bethanechol started on 10/29, will need to monitor BP in accordance  Continues to retain on 10/31- has Flomax and Lidocaine  Pt might benefit from foley- although starting to void small amounts; d/w pt- defer to primary team. 11. GERD: Continue Protonix. 12.  Hypoalbuminemia  Supplement initiated on 10/21 13.  Constipation, now with incontinence  Bowel meds increased on 10/21  Improving 14.  Post stroke dysphagia  D3 thins, advance as tolerated 15. FUO: Resolved  UA equivocal, Ucx <10k insig growth, wbc's 4.7, afebrile since 10/22  Chest x-ray personally reviewed, unremarkable for infection 16.  Sleep disturbance  Sleep chart  Ambien started 10/23 with some benefit, increased on 10/27  Improving 17.  Prediabetes  Elevated on 10/26 18.  Leukopenia: Resolved  WBCs 4.2 on 10/28  Continue to monitor  LOS: 12 days A FACE TO FACE EVALUATION WAS PERFORMED  Kenneith Stief 03/24/2019, 1:43 PM

## 2019-03-24 NOTE — Progress Notes (Signed)
Speech Language Pathology Daily Session Note  Patient Details  Name: Beth Foster MRN: [id number] Date of Birth: 01-21-1929  Today's Date: 03/24/2019 SLP Individual Time: 1515-1530 SLP Individual Time Calculation (min): 15 min  Short Term Goals: Week 2: SLP Short Term Goal 1 (Week 2): Pt will utilizie memory compensatory strategies to aid in recall of novel, daily information with mod A verbal cues. SLP Short Term Goal 2 (Week 2): Pt will complete functional basic problem solving tasks pertaining to ADLs with Supervision A verbal cues. SLP Short Term Goal 3 (Week 2): Pt will demonstrate self-awareness and self-correction of functional errors in problem solving tasks with min A verbal cues. SLP Short Term Goal 4 (Week 2): Pt will demonstrate selective attention in midly distracting environment for 15 minute intervals with Supervision A verbal cues for redirection. SLP Short Term Goal 5 (Week 2): Pt will demonstrate orientation x4 with Min A verbal cues. SLP Short Term Goal 6 (Week 2): Pt will demonstrate use of word finding strategies in structured and unstructured mildly complex tasks with Supervision A semantic cues.  Skilled Therapeutic Interventions:  Pt was seen for skilled ST targeting cognitive goals.  Upon arrival, pt was laying on her side in bed with complaints of "feeling poorly" due to a headache.  Pt had not reached out to nursing for pain medication since Tylenol was given this morning (per pt's report).  SLP discussed the option of calling nursing for pain medication to make pt more comfortable and pt agreed.  Nursing made aware.  While waiting on medications, SLP facilitated the session with functional conversation related to the photos in pt's room.  Therapist would describe people in pictures and pt was able to recall the names of family members in 2 out of 3 opportunities; however, she appeared to become slightly confused in 1 out of 3 instances.  She stated that one picture that  the therapist described to her was of her and her husband but then went on to say that both of the people in the picture were "gone," meaning deceased.  Pt verified that her husband had passed away and that she was the other person in the picture with him but continued to state that she was also gone despite therapist's attempts to correct.  Pt remained very fatigued and limited by pain; therefore, session was ended early and pt missed 30 min of scheduled ST.  Pt was left in bed with bed alarm set and call bell within reach.  Continue per current plan of care.   Pain Pain Assessment Pain Scale: 0-10 Pain Score: 6  Pain Type: Acute pain Pain Location: Head Pain Descriptors / Indicators: Aching Pain Intervention(s): Repositioned  Therapy/Group: Individual Therapy  Alexandrina Fiorini, Selinda Orion 03/24/2019, 3:59 PM

## 2019-03-24 NOTE — Progress Notes (Signed)
Patient continues to refuse caths. Educated patient about importance of in & out cath for no voids, UTI's. Patient agreed to be cathed at 2143. Urine is cloudy, amber, with strong odor and sediment.   Patient bladder scanned for 142, refused cath at 0549. Pt. Requests indwelling catheter because pain during caths is not bearable.

## 2019-03-24 NOTE — Progress Notes (Signed)
Occupational Therapy Session Note  Patient Details  Name: Beth Foster MRN: [id number] Date of Birth: February 19, 1929  Today's Date: 03/24/2019 OT Individual Time: 1345-1430 OT Individual Time Calculation (min): 45 min    Short Term Goals: Week 2:  OT Short Term Goal 1 (Week 2): Continue working towards week 1 STGs.  Skilled Therapeutic Interventions/Progress Updates:    Patient lying in bed, states that she has a head ache, willing to change position.  Supine to SSP with min A.  Sit to stand and SPT bed to/from commode with mod A and cues for posterior lean.  CM and hygiene with mod a for thoroughness.  She changed brief with max a over feet.  Returned to seated position at edge of bed with min a.  Due to c/o of fatigue with unsupported sitting she agreed to transfer to arm chair - min/mod A SPT.  She ate a few bites of her lunch with set up (handing food directly to her) and encouragement to [place].  Oral care completed max A.  She returned to bed with min A, min a for SSP to supine.  She missed 15 minutes of session due to fatigue.  Bed alarm set and call bell in reach at close of session.    Therapy Documentation Precautions:  Precautions Precautions: Fall Restrictions Weight Bearing Restrictions: No General: General OT Amount of Missed Time: 15 Minutes Vital Signs:   Pain: Pain Assessment Pain Scale: 0-10 Pain Score: 5  Pain Location: Head Pain Descriptors / Indicators: Aching Pain Intervention(s): Repositioned   Therapy/Group: Individual Therapy  Carlos Levering 03/24/2019, 2:36 PM

## 2019-03-25 ENCOUNTER — Inpatient Hospital Stay (HOSPITAL_COMMUNITY): Payer: Medicare Other | Admitting: Occupational Therapy

## 2019-03-25 ENCOUNTER — Inpatient Hospital Stay (HOSPITAL_COMMUNITY): Payer: Medicare Other

## 2019-03-25 ENCOUNTER — Inpatient Hospital Stay (HOSPITAL_COMMUNITY): Payer: Medicare Other | Admitting: Speech Pathology

## 2019-03-25 ENCOUNTER — Inpatient Hospital Stay (HOSPITAL_COMMUNITY): Payer: Medicare Other | Admitting: Physical Therapy

## 2019-03-25 LAB — BASIC METABOLIC PANEL
Anion gap: 10 (ref 5–15)
BUN: 11 mg/dL (ref 8–23)
CO2: 25 mmol/L (ref 22–32)
Calcium: 9.1 mg/dL (ref 8.9–10.3)
Chloride: 104 mmol/L (ref 98–111)
Creatinine, Ser: 0.63 mg/dL (ref 0.44–1.00)
GFR calc Af Amer: >60 mL/min (ref >60)
GFR calc non Af Amer: >60 mL/min (ref >60)
Glucose, Bld: 126 mg/dL — ABNORMAL HIGH (ref 70–99)
Potassium: 3.6 mmol/L (ref 3.5–5.1)
Sodium: 139 mmol/L (ref 135–145)

## 2019-03-25 LAB — URINE CULTURE: Culture: >=100000 — AB

## 2019-03-25 MED ORDER — LIDOCAINE 5 % EX PTCH
1.0000 | MEDICATED_PATCH | CUTANEOUS | Status: DC
  Administered 2019-03-25 – 2019-04-01 (×8): 1 via TRANSDERMAL
  Filled 2019-03-25 (×11): qty 1

## 2019-03-25 MED ORDER — BETHANECHOL CHLORIDE 25 MG PO TABS
50.0000 mg | ORAL_TABLET | Freq: Three times a day (TID) | ORAL | Status: DC
  Administered 2019-03-25 – 2019-03-29 (×12): 50 mg via ORAL
  Filled 2019-03-25 (×15): qty 2

## 2019-03-25 MED ORDER — NITROFURANTOIN MONOHYD MACRO 100 MG PO CAPS
100.0000 mg | ORAL_CAPSULE | Freq: Two times a day (BID) | ORAL | Status: DC
  Administered 2019-03-25 – 2019-03-26 (×2): 100 mg via ORAL
  Filled 2019-03-25: qty 1

## 2019-03-25 NOTE — Plan of Care (Signed)
  Problem: Consults Goal: RH STROKE PATIENT EDUCATION Description: See Patient Education module for education specifics  Outcome: Progressing   Problem: RH BOWEL ELIMINATION Goal: RH STG MANAGE BOWEL WITH ASSISTANCE Description: STG Manage Bowel with min Assistance. Outcome: Progressing Goal: RH STG MANAGE BOWEL W/MEDICATION W/ASSISTANCE Description: STG Manage Bowel with Medication with min Assistance. Outcome: Progressing   Problem: RH BLADDER ELIMINATION Goal: RH STG MANAGE BLADDER WITH ASSISTANCE Description: STG Manage Bladder With min Assistance Outcome: Progressing Goal: RH STG MANAGE BLADDER WITH MEDICATION WITH ASSISTANCE Description: STG Manage Bladder With Medication With min Assistance. Outcome: Progressing   Problem: RH SKIN INTEGRITY Goal: RH STG MAINTAIN SKIN INTEGRITY WITH ASSISTANCE Description: STG Maintain Skin Integrity With mod I Assistance. Outcome: Progressing   Problem: RH SAFETY Goal: RH STG DECREASED RISK OF FALL WITH ASSISTANCE Description: STG Decreased Risk of Fall With cues and reminder Assistance. Outcome: Progressing   Problem: RH COGNITION-NURSING Goal: RH STG ANTICIPATES NEEDS/CALLS FOR ASSIST W/ASSIST/CUES Description: STG Anticipates Needs/Calls for Assist With cues and reminders Assistance/Cues. Outcome: Progressing   Problem: RH PAIN MANAGEMENT Goal: RH STG PAIN MANAGED AT OR BELOW PT'S PAIN GOAL Description: Pain level less than 3 on scale of 0-10 Outcome: Progressing   Problem: RH KNOWLEDGE DEFICIT Goal: RH STG INCREASE KNOWLEDGE OF HYPERTENSION Description: Pt will be able to adhere to medication regimen for blood pressure control with min assist from family. Pt will demonstrate safety precaution to take upon discharge to prevent falls with cues and reminders from family. Outcome: Progressing   Problem: RH Vision Goal: RH LTG Vision (Specify) Outcome: Progressing

## 2019-03-25 NOTE — Progress Notes (Signed)
Speech Language Pathology Daily Session Note  Patient Details  Name: AMARACHI MCHATTON MRN: [id number] Date of Birth: 20-Feb-1929  Today's Date: 03/25/2019 SLP Individual Time: 1410-1453 SLP Individual Time Calculation (min): 43 min  Short Term Goals: Week 2: SLP Short Term Goal 1 (Week 2): Pt will utilizie memory compensatory strategies to aid in recall of novel, daily information with mod A verbal cues. SLP Short Term Goal 2 (Week 2): Pt will complete functional basic problem solving tasks pertaining to ADLs with Supervision A verbal cues. SLP Short Term Goal 3 (Week 2): Pt will demonstrate self-awareness and self-correction of functional errors in problem solving tasks with min A verbal cues. SLP Short Term Goal 4 (Week 2): Pt will demonstrate selective attention in midly distracting environment for 15 minute intervals with Supervision A verbal cues for redirection. SLP Short Term Goal 5 (Week 2): Pt will demonstrate orientation x4 with Min A verbal cues. SLP Short Term Goal 6 (Week 2): Pt will demonstrate use of word finding strategies in structured and unstructured mildly complex tasks with Supervision A semantic cues.  Skilled Therapeutic Interventions: Pt was seen for skilled ST targeting cognitive goals. Upon entrance to pt's room, she had hardly eaten any of her lunch. Provided Max encouragement to increase PO intake, pt agreeable to finish half of a magic cup and several sips of H2O with set up and assistance locating items due to visual impairments. Pt was oriented X4 with just 1 Min A verbal cue for correcting date (initially stated it was November 1). SLP further facilitated session with functional conversations to address memory goals and safety awareness. Overall Min A verbal question cues were required for pt to recall 4 specific tasks completed in earlier OT/PT sessions today. She verbally sequenced steps of transferring from bed to recliner with Min A verbal cues for verbal problem  solving and recall of safety precautions, increased Mod A for verbal recall of safety precautions to use in the restroom. Pt became uncomfortable with position in bed during session due to sacral pain and able to verbally problem solve way to reposition herself to relieve pressure with Min A verbal cues from SLP. When SLP asked pt why she needs help getting out of bed (and should use call bell to request help), pt demonstrated anticipatory awareness in her statement, "well, I could fall and really hurt myself." Pt was left laying in bed with alarm set and all needs within reach. Continue per current plan of care.      Pain Pain Assessment Pain Scale: Faces Faces Pain Scale: Hurts a little bit Pain Type: Acute pain Pain Location: Sacrum Pain Orientation: Mid Pain Descriptors / Indicators: Aching Pain Onset: On-going Pain Intervention(s): Repositioned Multiple Pain Sites: No  Therapy/Group: Individual Therapy  Arbutus Leas 03/25/2019, 2:54 PM

## 2019-03-25 NOTE — Progress Notes (Signed)
Physical Therapy Session Note  Patient Details  Name: Beth Foster MRN: [id number] Date of Birth: 05/21/1929  Today's Date: 03/25/2019 PT Individual Time: 1045-1130 PT Individual Time Calculation (min): 45 min   Short Term Goals: Week 2:  PT Short Term Goal 1 (Week 2): Pt will perform supine<>sit consistently with CGA PT Short Term Goal 2 (Week 2): Pt will perform sit<>stands using LRAD with min assist consistently PT Short Term Goal 3 (Week 2): Pt will perform bed<>chair transfers using LRAD with min assist consistently PT Short Term Goal 4 (Week 2): Pt will ambulate 58ft using LRAD with min assist  Skilled Therapeutic Interventions/Progress Updates:   Pt in recliner and agreeable to therapy, no specific c/o pain at rest, just fatigue. Mod assist sit>stand and ambulated 15' to w/c on other side of room. Total assist w/c transport to/from therapy gym. Worked on postural control in standing and increasing anterior weight shifting to thus decrease posterior lean bias. Min-mod sit<>stands to high low table and worked on bimanual tasks in stance, including folding washcloths and simple visual sorting based on color. Min-mod assist overall for static standing balance w/ verbal and tactile cues for weight shifting forward at hips. Stood in multiple bouts of 30-60 sec at a time. Pt began to perseverate on bottom pain, does not rate but says "it's so sore". Pain not relieved in standing vs sitting. Returned to room and ambulated into bathroom and performed toilet transfer w/ mod assist overall for transfer and LE garment management. Pt unsuccessful w/ timing void attempt. Pt appearing to be falling asleep while sitting on toilet and continued to c/o bottom pain. Stood at sink w/ min assist to wash hands prior to sitting at EOB w/ mod assist. Sit>R sidelying w/ mod assist and pt reports some relief in bottom pain. Pt quickly fell asleep. Ended session in R sidelying, all needs in reach. Missed 15 min of  skilled PT 2/2 pain/fatigue.   Therapy Documentation Precautions:  Precautions Precautions: Fall Restrictions Weight Bearing Restrictions: No Pain: Pain Assessment Pain Scale: 0-10 Pain Score: 7  Faces Pain Scale: No hurt Pain Type: Chronic pain Pain Location: Coccyx Pain Orientation: Mid Pain Descriptors / Indicators: Aching;Sore Pain Onset: On-going Pain Intervention(s): Repositioned  Therapy/Group: Individual Therapy  [name] Clent Demark 03/25/2019, 11:36 AM

## 2019-03-25 NOTE — Progress Notes (Signed)
Occupational Therapy Session Note  Patient Details  Name: Beth Foster MRN: [id number] Date of Birth: 14-Jul-1928  Today's Date: 03/25/2019 OT Individual Time: 3976-7341 OT Individual Time Calculation (min): 60 min    Short Term Goals: Week 1:  OT Short Term Goal 1 (Week 1): Pt will complete UB dressing with min assist for bra and button up shirt. OT Short Term Goal 1 - Progress (Week 1): Progressing toward goal OT Short Term Goal 2 (Week 1): Pt will complete LB bathing sit to stand with min assist. OT Short Term Goal 2 - Progress (Week 1): Progressing toward goal OT Short Term Goal 3 (Week 1): Pt will complete LB dressing sit to stand with min assist. OT Short Term Goal 3 - Progress (Week 1): Progressing toward goal OT Short Term Goal 4 (Week 1): Pt will ambulate to the toilet with min assist from wheelchari or EOB with RW. OT Short Term Goal 4 - Progress (Week 1): Progressing toward goal Week 2:  OT Short Term Goal 1 (Week 2): Continue working towards week 1 STGs.     Skilled Therapeutic Interventions/Progress Updates:    Pt received in bed and scooted to EOB with S and extra time. She ate a few more bites of yogurt and then pivoted to wc with min A.  Pt prefers to use her wipes vs wash cloths to bathe at sink.  S/u with grooming and bathing and donning pullover shirt, min A to fasten bra. Pt did not need to void but encouraged her to try to avoid cathing.  Pt completed stand pivot with bars from wc to toilet with min A. CGA standing balance as pt had slight posterior and L lean. From sitting on toilet, she washed her legs and donned clothing over feet without A.  A with socks and shoes.  Pt was able to stand with CGA and cleanse bottom and pull underwear and pants up.  Some c/o dizziness.  She then transferred back to w.c and practiced ambulation with RW to recliner (15 ft) with min A with slight left lean.  Needed cues for foot placement for sit to stand to allow for weight shift  forward. Sat in recliner for a few minutes and then practiced standing balance with the RW for support.  Used the walker bar for visual cues for forward wt shift to avoid posterior lean. Pt fatigued easily.  Resting in recliner with belt alarm on and all needs met. Call light in reach.  Therapy Documentation Precautions:  Precautions Precautions: Fall Restrictions Weight Bearing Restrictions: No  Pain: Pain Assessment Pain Score: 7  Pain Type: Chronic pain Pain Location: Coccyx Pain Orientation: Mid Pain Descriptors / Indicators: Aching;Sore Pain Onset: On-going Pain Intervention(s): Repositioned ADL: ADL Eating: Set up Where Assessed-Eating: Edge of bed Grooming: Setup Where Assessed-Grooming: Wheelchair Upper Body Bathing: Supervision/safety Where Assessed-Upper Body Bathing: Other (Comment)(toilet) Lower Body Bathing: Contact guard Where Assessed-Lower Body Bathing: Other (Comment)(toilet) Upper Body Dressing: Minimal assistance Where Assessed-Upper Body Dressing: Other (Comment)(toilet) Lower Body Dressing: Minimal assistance Where Assessed-Lower Body Dressing: Other (Comment)(toilet) Toileting: Minimal assistance Where Assessed-Toileting: Glass blower/designer: Minimal Print production planner Method: Stand pivot, Insurance claims handler Equipment: Raised toilet seat   Therapy/Group: Individual Therapy  , 03/25/2019, 8:56 AM

## 2019-03-25 NOTE — Progress Notes (Signed)
[facility] PHYSICAL MEDICINE & REHABILITATION PROGRESS NOTE  Subjective/Complaints: Patient seen sitting up in bed this morning.  She states she does not feel well this morning, but no particular reason why.  She states she wants to go home.  ROS: Denies CP, SOB, N/V/D  Objective: Vital Signs: Blood pressure (!) 168/81, pulse 98, temperature 98 F (36.7 C), resp. rate 20, height 5\' 5"  (1.651 m), weight 64.9 kg, SpO2 96 %. No results found. No results for input(s): WBC, HGB, HCT, PLT in the last 72 hours. Recent Labs    03/25/19 0522  NA 139  K 3.6  CL 104  CO2 25  GLUCOSE 126*  BUN 11  CREATININE 0.63  CALCIUM 9.1    Physical Exam: BP (!) 168/81 (BP Location: Left Arm)   Pulse 98   Temp 98 F (36.7 C)   Resp 20   Ht 5\' 5"  (1.651 m)   Wt 64.9 kg   SpO2 96%   BMI 23.81 kg/m   Constitutional: No distress . Vital signs reviewed. HENT: Normocephalic.  Atraumatic. Eyes: EOMI. No discharge. Cardiovascular: No JVD. Respiratory: Normal effort.  No stridor. GI: Non-distended. Skin: Warm and dry.  Intact. Psych: Normal mood.  Normal behavior. Musc: No edema in extremities.  No tenderness in extremities. Neurological:  Alert Motor:  Right upper extremity: 4/5 proximal to distal, unchanged Left upper extremities: 4-4+/5 proximal distal, unchanged Right lower extremity: 4/5 proximal distal Left lower extremity: 4/5 proximal to distal, unchanged  Assessment/Plan: 1. Functional deficits secondary to left cerebellar hematoma which require 3+ hours per day of interdisciplinary therapy in a comprehensive inpatient rehab setting.  Physiatrist is providing close team supervision and 24 hour management of active medical problems listed below.  Physiatrist and rehab team continue to assess barriers to discharge/monitor patient progress toward functional and medical goals  Care Tool:  Bathing    Body parts bathed by patient: Right upper leg, Left upper leg, Face, Front  perineal area, Abdomen, Chest, Left arm, Right arm, Buttocks   Body parts bathed by helper: Right lower leg, Left lower leg     Bathing assist Assist Level: Minimal Assistance - Patient > 75%     Upper Body Dressing/Undressing Upper body dressing   What is the patient wearing?: Bra, Pull over shirt    Upper body assist Assist Level: Minimal Assistance - Patient > 75%    Lower Body Dressing/Undressing Lower body dressing      What is the patient wearing?: Pants, Underwear/pull up     Lower body assist Assist for lower body dressing: Contact Guard/Touching assist     Toileting Toileting    Toileting assist Assist for toileting: Minimal Assistance - Patient > 75%(no void)     Transfers Chair/bed transfer  Transfers assist     Chair/bed transfer assist level: Minimal Assistance - Patient > 75%     Locomotion Ambulation   Ambulation assist      Assist level: Minimal Assistance - Patient > 75% Assistive device: Walker-rolling Max distance: 15 feet   Walk 10 feet activity   Assist     Assist level: Minimal Assistance - Patient > 75% Assistive device: Walker-rolling   Walk 50 feet activity   Assist Walk 50 feet with 2 turns activity did not occur: Safety/medical concerns(Per report )         Walk 150 feet activity   Assist Walk 150 feet activity did not occur: Safety/medical concerns(Per report )  Walk 10 feet on uneven surface  activity   Assist Walk 10 feet on uneven surfaces activity did not occur: Safety/medical concerns(Per report)         Wheelchair     Assist   Type of Wheelchair: Manual    Wheelchair assist level: Minimal Assistance - Patient > 75% Max wheelchair distance: 25    Wheelchair 50 feet with 2 turns activity    Assist    Wheelchair 50 feet with 2 turns activity did not occur: Safety/medical concerns(fatigue)       Wheelchair 150 feet activity     Assist Wheelchair 150 feet activity  did not occur: Safety/medical concerns(fatigue)          Medical Problem List and Plan: 1. Balance deficits with left lean as well as cognitive deficits affecting ADLs and mobility secondary to  left cerebellar hematoma with penetration of 4th ventricle and dependent within occipital horns and lateral ventricles  Continue CIR 2.  Antithrombotics: -DVT/anticoagulation:  Pharmaceutical: Lovenox             -antiplatelet therapy: N/A 3. HA/back pain/Pain Management: Continue tylenol prn   Lidoderm patch ordered on 11/2 4. Mood: LCSW to follow for evaluation and support.              -antipsychotic agents: N/A 5. Neuropsych: This patient is not fully capable of making decisions on her own behalf. 6. Skin/Wound Care: Routine pressure relief measures.  Maintain adequate nutrition and hydration status. 7. Fluids/Electrolytes/Nutrition:      -Offer nutritional supplements  Poor intake on 11/2, however taking supplements and slightly improved overall  BMP within acceptable range on 11/2, except for glucose 8.  HTN: Monitor   Cardizem daily, decreased to 180 on 10/22   Labile on 11/2  Orthostatics positive on 10/28 9.  Macular degeneration: Visual hallucinations have been worsening.  Exacerbated by bilateral cataracts as well as elevated pressures left eye.    Started Xalatan (new RX per family)  36. Urinary retention:   Flomax started on 10/23, increased on 10/26  Bethanechol started on 10/29, increased again on 11/2  Continue I/O's, discussed with nursing use of coude caths. 11. GERD: Continue Protonix. 12.  Hypoalbuminemia  Supplement initiated on 10/21 13. Constipation, now with incontinence  Bowel meds increased on 10/21  Improving 14.  Post stroke dysphagia  D3 thins, advance as tolerated 15. FUO: Resolved  UA equivocal, Ucx <10k insig growth, wbc's 4.7, afebrile since 10/22  Chest x-ray personally reviewed, unremarkable for infection 16.  Sleep disturbance  Sleep  chart  Ambien started 10/23 with some benefit, increased on 10/27  Improving overall 17.  Prediabetes  Elevated on 11/2 18.  Leukopenia: Resolved  WBCs 4.2 on 10/28  Continue to monitor  LOS: 13 days A FACE TO FACE EVALUATION WAS PERFORMED  Scherrie Seneca Lorie Phenix 03/25/2019, 10:10 AM

## 2019-03-25 NOTE — NC FL2 (Addendum)
[place] MEDICAID FL2 LEVEL OF CARE SCREENING TOOL     IDENTIFICATION  Patient Name: Beth Foster Birthdate: 10-28-1928 Sex: female Admission Date (Current Location): 03/12/2019  [facility] and [place] Number:  Herbalist and Address:  [facility] [place], [place] [address], [place], [place] [address]      Provider Number: [id number]  Attending Physician Name and Address:  Jamse Arn, MD  Relative Name and Phone Number:  Jetta Lout  [phone]    Current Level of Care: Other (Comment)(Rehab) Recommended Level of Care: [place] Prior Approval Number:    Date Approved/Denied:   PASRR Number: [id number]  Discharge Plan: SNF    Current Diagnoses: Patient Active Problem List   Diagnosis Date Noted  . Leukopenia   . Orthostatic hypotension   . Labile blood pressure   . Dysphagia, post-stroke   . FUO (fever of unknown origin)   . Sleep disturbance   . Hypoalbuminemia due to protein-calorie malnutrition ([place])   . Urinary retention   . Benign essential HTN   . Essential hypertension 03/12/2019  . Hyperlipidemia 03/12/2019  . UTI (urinary tract infection) 03/12/2019  . Enterococcus UTI   . Macular degeneration   . Cognitive deficit, post-stroke   . Pressure injury of skin 03/09/2019  . ICH (intracerebral hemorrhage) (HCC) w/ IVH, L cerebellar 03/08/2019  . Vascular dementia without behavioral disturbance ([place]) 02/28/2019  . Cerebrovascular disease 02/28/2019  . Visual field cut 02/28/2019  . Slow transit constipation 02/28/2019  . Rectal bleeding 02/28/2019  . Venous insufficiency 07/15/2016  . Visual hallucinations 07/15/2016  . OAB (overactive bladder) 07/15/2016  . Cellulitis 07/09/2016  . Cellulitis of right lower extremity 07/09/2016  . Neck pain 01/15/2016  . Left cervical lymphadenopathy 01/08/2016  . Cough 06/09/2015  . Dyspnea 06/09/2015  . Acute bronchitis 06/09/2015  .  Allergic rhinitis 04/15/2015  . Syncope and collapse 01/10/2015  . Contusion of left supra orbit 01/10/2015  . Laceration of right eyebrow 01/10/2015  . DOE (dyspnea on exertion) 01/10/2015  . Exertional chest pain 01/10/2015  . Fatigue 01/10/2015  . Acute hyperglycemia 01/10/2015  . Hemorrhoids 10/28/2013  . Vitamin D deficiency 10/28/2013  . Painful lumpy right breast 12/06/2012  . HLD (hyperlipidemia) 12/06/2012  . Essential hypertension, benign 12/06/2012  . Malignant neoplasm of breast (female), unspecified site   . Mild cognitive impairment with memory loss 08/20/2012  . Gait disorder 08/20/2012  . Wound, open, head 08/20/2012  . GERD (gastroesophageal reflux disease) 08/20/2012    Orientation RESPIRATION BLADDER Height & Weight     Self, Place, Situation  Normal Incontinent 50 % of the time-timed toileting Weight: 143 lb 1.3 oz (64.9 kg) Height:  5\' 5"  (165.1 cm)  BEHAVIORAL SYMPTOMS/MOOD NEUROLOGICAL BOWEL NUTRITION STATUS      Continent Diet(Dys 3 thin liquid diet)  AMBULATORY STATUS COMMUNICATION OF NEEDS Skin   Extensive Assist Verbally Normal                       Personal Care Assistance Level of Assistance  Bathing, Dressing Bathing Assistance: Limited assistance   Dressing Assistance: Limited assistance     Functional Limitations Info  Sight, Speech Sight Info: Impaired   Speech Info: Impaired    SPECIAL CARE FACTORS FREQUENCY  PT (By licensed PT), OT (By licensed OT), Speech therapy     PT Frequency: 5x week OT Frequency: 5x week     Speech Therapy Frequency: 5x week  Contractures Contractures Info: Not present    Additional Factors Info  Code Status, Allergies Code Status Info: Full Code Allergies Info: Amoxicillin, Latex, prednisone, Cephalexin-etc           Current Medications (03/25/2019):  This is the current hospital active medication list Current Facility-Administered Medications  Medication Dose Route Frequency  Provider Last Rate Last Dose  . acetaminophen (TYLENOL) tablet 325-650 mg  325-650 mg Oral Q4H PRN Bary Leriche, PA-C   650 mg at 03/23/19 2117  . alum & mag hydroxide-simeth (MAALOX/MYLANTA) 200-200-20 MG/5ML suspension 30 mL  30 mL Oral Q4H PRN Love, Pamela S, PA-C      . bethanechol (URECHOLINE) tablet 50 mg  50 mg Oral TID Jamse Arn, MD      . bisacodyl (DULCOLAX) suppository 10 mg  10 mg Rectal Daily PRN Love, Pamela S, PA-C      . diltiazem (CARDIZEM CD) 24 hr capsule 180 mg  180 mg Oral QHS Love, Pamela S, PA-C   180 mg at 03/24/19 2215  . diphenhydrAMINE (BENADRYL) 12.5 MG/5ML elixir 12.5-25 mg  12.5-25 mg Oral Q6H PRN Love, Pamela S, PA-C      . enoxaparin (LOVENOX) injection 40 mg  40 mg Subcutaneous Q24H Love, Pamela S, PA-C   40 mg at 03/24/19 2217  . estradiol (ESTRACE) vaginal cream 1 Applicatorful  1 Applicatorful Vaginal Once per day on Mon Wed Fri Love, Pamela S, PA-C   1 Applicatorful at [id number] [phone]  . feeding supplement (ENSURE ENLIVE) (ENSURE ENLIVE) liquid 237 mL  237 mL Oral TID BM Jamse Arn, MD   237 mL at 03/25/19 1000  . feeding supplement (PRO-STAT SUGAR FREE 64) liquid 30 mL  30 mL Oral BID Jamse Arn, MD   30 mL at 03/25/19 0838  . guaiFENesin-dextromethorphan (ROBITUSSIN DM) 100-10 MG/5ML syrup 5-10 mL  5-10 mL Oral Q6H PRN Love, Pamela S, PA-C      . latanoprost (XALATAN) 0.005 % ophthalmic solution 1 drop  1 drop Both Eyes QHS Bary Leriche, PA-C   1 drop at 03/24/19 2216  . lidocaine (XYLOCAINE) 2 % jelly   Topical PRN Bary Leriche, PA-C   5 application at [id number]  . multivitamin with minerals tablet 1 tablet  1 tablet Oral Daily Jamse Arn, MD   1 tablet at 03/25/19 0837  . Muscle Rub CREA   Topical TID AC & HS Love, Pamela S, PA-C      . pantoprazole (PROTONIX) EC tablet 40 mg  40 mg Oral Daily Bary Leriche, PA-C   40 mg at 03/25/19 0837  . polyethylene glycol (MIRALAX / GLYCOLAX) packet 17 g  17 g Oral BID Jamse Arn, MD   17 g at 03/25/19 [id number]  . prochlorperazine (COMPAZINE) tablet 5-10 mg  5-10 mg Oral Q6H PRN Love, Pamela S, PA-C       Or  . prochlorperazine (COMPAZINE) injection 5-10 mg  5-10 mg Intramuscular Q6H PRN Love, Pamela S, PA-C       Or  . prochlorperazine (COMPAZINE) suppository 12.5 mg  12.5 mg Rectal Q6H PRN Love, Pamela S, PA-C      . senna-docusate (Senokot-S) tablet 2 tablet  2 tablet Oral QHS Bary Leriche, PA-C   2 tablet at 03/24/19 2215  . tamsulosin (FLOMAX) capsule 0.8 mg  0.8 mg Oral QPC supper Jamse Arn, MD   0.8 mg at 03/24/19 1741  . traZODone (DESYREL) tablet 25-50  mg  25-50 mg Oral QHS PRN Love, Pamela S, PA-C      . witch hazel-glycerin (TUCKS) pad   Topical PRN Love, Ivan Anchors, PA-C      . zolpidem (AMBIEN) tablet 5 mg  5 mg Oral QHS Jamse Arn, MD   5 mg at 03/24/19 2215     Discharge Medications: Please see discharge summary for a list of discharge medications.  Relevant Imaging Results:  Relevant Lab Results:   Additional Information SSN: [id number]  Nyah Shepherd, Gardiner Rhyme, LCSW

## 2019-03-25 NOTE — Progress Notes (Signed)
Physical Therapy Session Note  Patient Details  Name: Beth Foster MRN: [id number] Date of Birth: May 09, 1929  Today's Date: 03/25/2019 PT Individual Time: [id number] PT Individual Time Calculation (min): 58 min   Short Term Goals: Week 2:  PT Short Term Goal 1 (Week 2): Pt will perform supine<>sit consistently with CGA PT Short Term Goal 2 (Week 2): Pt will perform sit<>stands using LRAD with min assist consistently PT Short Term Goal 3 (Week 2): Pt will perform bed<>chair transfers using LRAD with min assist consistently PT Short Term Goal 4 (Week 2): Pt will ambulate 55ft using LRAD with min assist  Skilled Therapeutic Interventions/Progress Updates:    Pt supine in bed upon PT arrival, agreeable to therapy tx and denies pain but reports feeling very tired. Pt transferred to sitting EOB with mod assist, reports dizziness upon sitting. Therapist donned teds for BP management, total assist. Pt performed sit<>stand with min assist, cues to correct posterior lean in standing. Stand pivot to the w/c with RW and mod assist secondary to posterior lean. Pt transported to the gym. Pt performed seated LAQ and seated marches x 20 each for LE strengthening. Pt worked on standing balance and upright posture with single UE support on RW while performing card matching activity, min assist for balance with posterior lean. Pt continues to report "I just don't feel well, I feel dizzy." Therapist checked vitals as follows: BP127/67, HR 91 bpm in sitting, BP 92/67, HR 97 bpm in standing. Pt with orthostatic hypotension even with thigh high teds donned. Deferred standing activities secondary to dizziness and notified RN. Pt performed seated UE strengthening exercises, 2 x 10 each with 1# dumbbell: shoulder press, chest press and bicep curls. Pt transported back to room and transferred to bed with min assist stand pivot. Worked on sitting balance while eating lunch, encouragement to try to eat food. Pt left supine in bed  at end of session with needs in reach and bed alarm set.   Therapy Documentation Precautions:  Precautions Precautions: Fall Restrictions Weight Bearing Restrictions: No General: PT Amount of Missed Time (min): 15 Minutes PT Missed Treatment Reason: Pain    Therapy/Group: Individual Therapy  Netta Corrigan, PT, DPT, CSRS 03/25/2019, 12:12 PM

## 2019-03-26 ENCOUNTER — Inpatient Hospital Stay (HOSPITAL_COMMUNITY): Payer: Medicare Other | Admitting: Speech Pathology

## 2019-03-26 ENCOUNTER — Inpatient Hospital Stay (HOSPITAL_COMMUNITY): Payer: Medicare Other | Admitting: Occupational Therapy

## 2019-03-26 ENCOUNTER — Inpatient Hospital Stay (HOSPITAL_COMMUNITY): Payer: Medicare Other

## 2019-03-26 DIAGNOSIS — N390 Urinary tract infection, site not specified: Secondary | ICD-10-CM | POA: Insufficient documentation

## 2019-03-26 MED ORDER — NITROFURANTOIN MONOHYD MACRO 100 MG PO CAPS
100.0000 mg | ORAL_CAPSULE | Freq: Two times a day (BID) | ORAL | Status: AC
  Administered 2019-03-26 – 2019-04-04 (×18): 100 mg via ORAL
  Filled 2019-03-26 (×19): qty 1

## 2019-03-26 NOTE — Plan of Care (Signed)
  Problem: Consults Goal: RH STROKE PATIENT EDUCATION Description: See Patient Education module for education specifics  Outcome: Progressing   Problem: RH BOWEL ELIMINATION Goal: RH STG MANAGE BOWEL WITH ASSISTANCE Description: STG Manage Bowel with min Assistance. Outcome: Progressing Goal: RH STG MANAGE BOWEL W/MEDICATION W/ASSISTANCE Description: STG Manage Bowel with Medication with min Assistance. Outcome: Progressing   Problem: RH BLADDER ELIMINATION Goal: RH STG MANAGE BLADDER WITH ASSISTANCE Description: STG Manage Bladder With min Assistance Outcome: Progressing Goal: RH STG MANAGE BLADDER WITH MEDICATION WITH ASSISTANCE Description: STG Manage Bladder With Medication With min Assistance. Outcome: Progressing   Problem: RH SKIN INTEGRITY Goal: RH STG MAINTAIN SKIN INTEGRITY WITH ASSISTANCE Description: STG Maintain Skin Integrity With mod I Assistance. Outcome: Progressing   Problem: RH SAFETY Goal: RH STG DECREASED RISK OF FALL WITH ASSISTANCE Description: STG Decreased Risk of Fall With cues and reminder Assistance. Outcome: Progressing   Problem: RH COGNITION-NURSING Goal: RH STG ANTICIPATES NEEDS/CALLS FOR ASSIST W/ASSIST/CUES Description: STG Anticipates Needs/Calls for Assist With cues and reminders Assistance/Cues. Outcome: Progressing   Problem: RH PAIN MANAGEMENT Goal: RH STG PAIN MANAGED AT OR BELOW PT'S PAIN GOAL Description: Pain level less than 3 on scale of 0-10 Outcome: Progressing   Problem: RH KNOWLEDGE DEFICIT Goal: RH STG INCREASE KNOWLEDGE OF HYPERTENSION Description: Pt will be able to adhere to medication regimen for blood pressure control with min assist from family. Pt will demonstrate safety precaution to take upon discharge to prevent falls with cues and reminders from family. Outcome: Progressing   Problem: RH Vision Goal: RH LTG Vision (Specify) Outcome: Progressing

## 2019-03-26 NOTE — Progress Notes (Signed)
[facility] PHYSICAL MEDICINE & REHABILITATION PROGRESS NOTE  Subjective/Complaints: Patient seen sitting up in bed this morning working with therapies.  She states she slept well overnight.  Confirmed with sleep chart.  Discussed cognition with therapies.  She is slightly more slowed and confused this morning.  Discussed UTI and caths with patient as well.  ROS: Denies CP, SOB, N/V/D  Objective: Vital Signs: Blood pressure 121/71, pulse 84, temperature 98.1 F (36.7 C), resp. rate 18, height 5\' 5"  (1.651 m), weight 64.9 kg, SpO2 95 %. No results found. No results for input(s): WBC, HGB, HCT, PLT in the last 72 hours. Recent Labs    03/25/19 0522  NA 139  K 3.6  CL 104  CO2 25  GLUCOSE 126*  BUN 11  CREATININE 0.63  CALCIUM 9.1    Physical Exam: BP 121/71 (BP Location: Right Arm)   Pulse 84   Temp 98.1 F (36.7 C)   Resp 18   Ht 5\' 5"  (1.651 m)   Wt 64.9 kg   SpO2 95%   BMI 23.81 kg/m   Constitutional: No distress . Vital signs reviewed. HENT: Normocephalic.  Atraumatic. Eyes: EOMI. No discharge. Cardiovascular: No JVD. Respiratory: Normal effort.  No stridor. GI: Non-distended. Skin: Warm and dry.  Intact. Psych: Normal mood.  Normal behavior. Musc: No edema in extremities.  No tenderness in extremities. Neurological:  Alert and oriented x2 with increased time Motor:  Right upper extremity: 4/5 proximal to distal, unchanged Left upper extremities: 4-4+/5 proximal distal, unchanged Right lower extremity: 4-4+/5 proximal distal Left lower extremity: 4-4+/5 proximal to distal  Assessment/Plan: 1. Functional deficits secondary to left cerebellar hematoma which require 3+ hours per day of interdisciplinary therapy in a comprehensive inpatient rehab setting.  Physiatrist is providing close team supervision and 24 hour management of active medical problems listed below.  Physiatrist and rehab team continue to assess barriers to discharge/monitor patient progress  toward functional and medical goals  Care Tool:  Bathing    Body parts bathed by patient: Right upper leg, Left upper leg, Face, Front perineal area, Abdomen, Chest, Left arm, Right arm, Buttocks   Body parts bathed by helper: Right lower leg, Left lower leg     Bathing assist Assist Level: Minimal Assistance - Patient > 75%     Upper Body Dressing/Undressing Upper body dressing   What is the patient wearing?: Bra, Pull over shirt    Upper body assist Assist Level: Minimal Assistance - Patient > 75%    Lower Body Dressing/Undressing Lower body dressing      What is the patient wearing?: Pants, Underwear/pull up     Lower body assist Assist for lower body dressing: Contact Guard/Touching assist     Toileting Toileting    Toileting assist Assist for toileting: Minimal Assistance - Patient > 75%(no void)     Transfers Chair/bed transfer  Transfers assist     Chair/bed transfer assist level: Moderate Assistance - Patient 50 - 74%     Locomotion Ambulation   Ambulation assist      Assist level: Moderate Assistance - Patient 50 - 74% Assistive device: Walker-rolling Max distance: 15'   Walk 10 feet activity   Assist     Assist level: Moderate Assistance - Patient - 50 - 74% Assistive device: Walker-rolling   Walk 50 feet activity   Assist Walk 50 feet with 2 turns activity did not occur: Safety/medical concerns(Per report )         Walk 150 feet  activity   Assist Walk 150 feet activity did not occur: Safety/medical concerns(Per report )         Walk 10 feet on uneven surface  activity   Assist Walk 10 feet on uneven surfaces activity did not occur: Safety/medical concerns(Per report)         Wheelchair     Assist   Type of Wheelchair: Manual    Wheelchair assist level: Minimal Assistance - Patient > 75% Max wheelchair distance: 25    Wheelchair 50 feet with 2 turns activity    Assist    Wheelchair 50 feet with  2 turns activity did not occur: Safety/medical concerns(fatigue)       Wheelchair 150 feet activity     Assist Wheelchair 150 feet activity did not occur: Safety/medical concerns(fatigue)          Medical Problem List and Plan: 1. Balance deficits with left lean as well as cognitive deficits affecting ADLs and mobility secondary to  left cerebellar hematoma with penetration of 4th ventricle and dependent within occipital horns and lateral ventricles  Continue CIR  Call patient's daughter to update and discuss discharge plans as well as goals of care.  No answer, left voicemail for call back. 2.  Antithrombotics: -DVT/anticoagulation:  Pharmaceutical: Lovenox             -antiplatelet therapy: N/A 3. HA/back pain/Pain Management: Continue tylenol prn   Lidoderm patch ordered on 11/2 4. Mood: LCSW to follow for evaluation and support.              -antipsychotic agents: N/A 5. Neuropsych: This patient is not fully capable of making decisions on her own behalf. 6. Skin/Wound Care: Routine pressure relief measures.  Maintain adequate nutrition and hydration status. 7. Fluids/Electrolytes/Nutrition:      -Offer nutritional supplements  Relatively poor p.o. intake on 11/3, however taking supplements and slightly improved overall  BMP within acceptable range on 11/2, except for glucose 8.  HTN: Monitor   Cardizem daily, decreased to 180 on 10/22   Controlled on 11/3  Orthostatics positive on 10/28 9.  Macular degeneration: Visual hallucinations have been worsening.  Exacerbated by bilateral cataracts as well as elevated pressures left eye.    Started Xalatan (new RX per family)  46. Urinary retention:   Flomax started on 10/23, increased on 10/26  Bethanechol started on 10/29, increased again on 11/2  Continue I/O's, discussed with nursing use of coude caths, discussed with patient as well. 11. GERD: Continue Protonix. 12.  Hypoalbuminemia  Supplement initiated on 10/21 13.  Constipation, now with incontinence  Bowel meds increased on 10/21  Improving 14.  Post stroke dysphagia  D3 thins, advance as tolerated 15. FUO: Resolved  UA equivocal, Ucx <10k insig growth, wbc's 4.7, afebrile since 10/22  Chest x-ray personally reviewed, unremarkable for infection 16.  Sleep disturbance  Sleep chart  Ambien started 10/23 with some benefit, increased on 10/27  Improving overall 17.  Prediabetes  Elevated on 11/2 18.  Leukopenia: Resolved  WBCs 4.2 on 10/28  Continue to monitor 19.  Acute lower UTI-Enterococcus faecalis  Macrobid started on 11/2  LOS: 14 days A FACE TO FACE EVALUATION WAS PERFORMED  Ankit Lorie Phenix 03/26/2019, 9:40 AM

## 2019-03-26 NOTE — Progress Notes (Signed)
Occupational Therapy Session Note  Patient Details  Name: Beth Foster MRN: [id number] Date of Birth: 29-Sep-1928  Today's Date: 03/26/2019 OT Individual Time: [id number] OT Individual Time Calculation (min): 48 min    Short Term Goals: Week 2:  OT Short Term Goal 1 (Week 2): Continue working towards week 1 STGs.  Skilled Therapeutic Interventions/Progress Updates:    Pt in bed to start session.  She was able to transfer from supine to EOB with min assist and min instructional cueing.  Min assist for stand pivot transfer to the wheelchair and then down to the mat in the therapy gym.  She worked on LUE FM coordination and reach by picking up 1" round pegs and placing them into a peg board with increased time.  Had her use her right hand to locate the peg holes and then place them in the peg board with the right with max assist. She was able to complete task but needed increased time.  Returned to the room and pt completed stand pivot transfer with the RW to the recliner chair.  Min assist overall with increased LOB posteriorly in standing.  Finished session with call button and phone in reach with chair alarm in place.    Therapy Documentation Precautions:  Precautions Precautions: Fall Restrictions Weight Bearing Restrictions: No  Pain: Pain Assessment Pain Scale: Faces Pain Score: 0-No pain ADL: See Care Tool Section for some details of ADL tasks  Therapy/Group: Individual Therapy  Jeshua Ransford OTR/L 03/26/2019, 3:37 PM

## 2019-03-26 NOTE — Progress Notes (Addendum)
Pt is very fatigue, having a hard time catching breath when transferring. Pt can let you know that she is our of breath and that she needs to rest. Oxygen is always at least at 100 or 95 when shortness of breath is happening. Pt is fatigued most of the day even if sleeping all night.

## 2019-03-26 NOTE — Progress Notes (Signed)
Occupational Therapy Session Note  Patient Details  Name: Beth Foster MRN: [id number] Date of Birth: Nov 09, 1928  Today's Date: 03/26/2019 OT Individual Time: [id number] OT Individual Time Calculation (min): 50 min  and Today's Date: 03/26/2019 OT Missed Time: 10 Minutes Missed Time Reason: Patient fatigue   Short Term Goals: Week 1:  OT Short Term Goal 1 (Week 1): Pt will complete UB dressing with min assist for bra and button up shirt. OT Short Term Goal 1 - Progress (Week 1): Progressing toward goal OT Short Term Goal 2 (Week 1): Pt will complete LB bathing sit to stand with min assist. OT Short Term Goal 2 - Progress (Week 1): Progressing toward goal OT Short Term Goal 3 (Week 1): Pt will complete LB dressing sit to stand with min assist. OT Short Term Goal 3 - Progress (Week 1): Progressing toward goal OT Short Term Goal 4 (Week 1): Pt will ambulate to the toilet with min assist from wheelchari or EOB with RW. OT Short Term Goal 4 - Progress (Week 1): Progressing toward goal Week 2:  OT Short Term Goal 1 (Week 2): Continue working towards week 1 STGs.  Skilled Therapeutic Interventions/Progress Updates:    Pt received in bed stating she was very tired as she did not sleep easily last night.  She agreed to try to get up to use the bathroom and completed self care.  Pt sat to EOB with extra time and S.  Rose to stand with cues to use B hands to push off the bed with min A. She continues to have a posterior lean so had pt just practice standing shifting her weight forward over her toes. Pt c/o dizziness, so sat rested and stood again standing for a full minute before ambulating with RW to toilet.  Pt needed mod A as she was leaning to her L and drifting to L in RW space. Needed mod A for support and to guide the RW.  She was able to manage clothing and cleanse with min A to support balance but did not void.   Pt used her wipes to sponge bathe as she declined a shower again. Pt stated she does  not like to shower and really prefers the sponge bath.  Donned button up shirt with mod A, pants with min A.  Just kept on non slip socks.  She frequently got short of breath but her O2 sat 100%.  Needed frequent rest breaks. Ambulated halfway to recliner and needed to sit in w/c to rest.  2nd trial she got to recliner with mod A to turn walker and keep RW close to her. Pt very distracted by her fatigue levels saying,  "I just cant focus on anything right now, I am so tired".   Pt adjusted in recliner so she could nap, belt alarm on and call light in reach.  Her S LTGs adjusted to min A.  Will discuss with team tomorrow the option of pt being on a 15/7 schedule.    Therapy Documentation Precautions:  Precautions Precautions: Fall Restrictions Weight Bearing Restrictions: No  General OT Amount of Missed Time: 10 Minutes    Pain: Pain Assessment Pain Scale: 0-10 Pain Score: 0-No pain Faces Pain Scale: No hurt ADL: ADL Eating: Set up Where Assessed-Eating: Edge of bed Grooming: Setup Where Assessed-Grooming: Wheelchair Upper Body Bathing: Supervision/safety Where Assessed-Upper Body Bathing: Other (Comment)(toilet) Lower Body Bathing: Contact guard Where Assessed-Lower Body Bathing: Other (Comment)(toilet) Upper Body Dressing: Minimal assistance Where Assessed-Upper  Body Dressing: Other (Comment)(toilet) Lower Body Dressing: Minimal assistance Where Assessed-Lower Body Dressing: Other (Comment)(toilet) Toileting: Minimal assistance Where Assessed-Toileting: Glass blower/designer: Minimal Print production planner Method: Stand pivot, Insurance claims handler Equipment: Raised toilet seat   Therapy/Group: Individual Therapy  [place] 03/26/2019, 10:43 AM

## 2019-03-26 NOTE — Progress Notes (Signed)
Physical Therapy Session Note  Patient Details  Name: Beth Foster MRN: [id number] Date of Birth: [birth year]  Today's Date: 03/26/2019 PT Individual Time: 1430-1500 PT Individual Time Calculation (min): 30 min   Short Term Goals: Week 1:  PT Short Term Goal 1 (Week 1): pt will move sit> supine with supervision PT Short Term Goal 1 - Progress (Week 1): Progressing toward goal PT Short Term Goal 2 (Week 1): pt will transfer bed>< w/c with min assist PT Short Term Goal 2 - Progress (Week 1): Progressing toward goal PT Short Term Goal 3 (Week 1): pt will propel w/c x 50' with min assist PT Short Term Goal 3 - Progress (Week 1): Progressing toward goal PT Short Term Goal 4 (Week 1): pt will perform gait with LRAD x 25' with mod assist PT Short Term Goal 4 - Progress (Week 1): Met Week 2:  PT Short Term Goal 1 (Week 2): Pt will perform supine<>sit consistently with CGA PT Short Term Goal 2 (Week 2): Pt will perform sit<>stands using LRAD with min assist consistently PT Short Term Goal 3 (Week 2): Pt will perform bed<>chair transfers using LRAD with min assist consistently PT Short Term Goal 4 (Week 2): Pt will ambulate 47f using LRAD with min assist  Skilled Therapeutic Interventions/Progress Updates:   Received pt supine in bed pt agreeable to PT, and denied any pain during session. Pt reported she was very fatigued today from therapy earlier but agreed to participate in bed exercises. In supine pt performed bilateral hip flexion x10, bilateral hip abduction x10, and bilateral SLR x10. Pt was fatigues after exercises and required rest breaks in between. Pt requested to use the bathroom and PT assisted. Pt rolled to L with supervision and transferred supine<>sit min A for trunk control and LE management. Pt transferred bed<>WC stand<>pivot without AD min A for turning and balance. Pt transferred WC<>commode stand<>pivot min A and was able to maintain standing balance CGA with PT assisted with LE  clothing management and hygiene min A. Pt washed hands at sink while seated in WC with min A to turn on/off water. Pt transferred WC<>recliner stand<>pivot without AD min A. Concluded session with pt sitting in recliner, needs within reach, and seatbelt alarm on.    Therapy Documentation Precautions:  Precautions Precautions: Fall Restrictions Weight Bearing Restrictions: No  Therapy/Group: Individual Therapy  AAlfonse AlpersPT, DPT   03/26/2019, 12:34 PM

## 2019-03-26 NOTE — Progress Notes (Signed)
Nutrition Follow-up  DOCUMENTATION CODES:   Not applicable  INTERVENTION:  Continue Ensure Enlive po TID, each supplement provides 350 kcal and 20 grams of protein.  Provide Magic cup TID with meals, each supplement provides 290 kcal and 9 grams of protein.  Continue 30 ml Prostat po BID, each supplement provides 100 kcal and 15 grams of protein.   Encourage adequate PO intake.   NUTRITION DIAGNOSIS:   Increased nutrient needs related to wound healing as evidenced by estimated needs; ongoing  GOAL:   Patient will meet greater than or equal to 90% of their needs; progressing  MONITOR:   PO intake, Supplement acceptance, Weight trends, Skin  REASON FOR ASSESSMENT:   Consult Poor PO  ASSESSMENT:   85 year old female with PMH of HTN, prior stroke with decreased [place] RUE, glaucoma, macular degeneration with worsening of vision and recent reports of amaurosis fugax. Pt was admitted on 03/08/19 after found down and CT head done revealing small cerebellar peduncle hemorrhage with mild surrounding edema. CTA head/neck showed tortuosity and dilatation of supraclinoid ICA and no large vessel occlusion. MRI brain showed stable left cerebellar hematoma with penetration of 4th ventricle and dependent within occipital horns and lateral ventricles. Extensive chronic small vessel disease noted. Pt admitted to CIR on 10/20.  Meal completion has been 15-50%. Pt asleep during attempted time of visit. Pt currently has Ensure and Prostat ordered and has been consuming them. RD to continue nutritional supplementation to aid in caloric and protein needs.    Labs and medications reviewed.   Diet Order:   Diet Order            Diet regular Room service appropriate? Yes; Fluid consistency: Thin  Diet effective now              EDUCATION NEEDS:   Education needs have been addressed  Skin:  Skin Assessment: Skin Integrity Issues: Skin Integrity Issues:: Stage I DTI: sacrum, head Stage I:  Right buttocks  Last BM:  11/3  Height:   Ht Readings from Last 1 Encounters:  03/14/19 5\' 5"  (1.651 m)    Weight:   Wt Readings from Last 1 Encounters:  03/14/19 64.9 kg    Ideal Body Weight:  56.8 kg  BMI:  Body mass index is 23.81 kg/m.  Estimated Nutritional Needs:   Kcal:  1450-1650  Protein:  70-85 grams  Fluid:  1.4-1.6 L    Corrin Parker, MS, RD, LDN Pager # [phone] After hours/ weekend pager # [phone]

## 2019-03-26 NOTE — Evaluation (Signed)
Speech Language Pathology Assessment and Plan  Patient Details  Name: Beth Foster MRN: [id number] Date of Birth: 16-Mar-1929  SLP Diagnosis: Cognitive Impairments;Speech and Language deficits  Rehab Potential: Good ELOS: pending SNF placement    Today's Date: 03/26/2019 SLP Individual Time: 0730-0830 SLP Individual Time Calculation (min): 60 min   Problem List:  Patient Active Problem List   Diagnosis Date Noted  . Leukopenia   . Orthostatic hypotension   . Labile blood pressure   . Dysphagia, post-stroke   . FUO (fever of unknown origin)   . Sleep disturbance   . Hypoalbuminemia due to protein-calorie malnutrition ([place])   . Urinary retention   . Benign essential HTN   . Essential hypertension 03/12/2019  . Hyperlipidemia 03/12/2019  . UTI (urinary tract infection) 03/12/2019  . Enterococcus UTI   . Macular degeneration   . Cognitive deficit, post-stroke   . Pressure injury of skin 03/09/2019  . ICH (intracerebral hemorrhage) (HCC) w/ IVH, L cerebellar 03/08/2019  . Vascular dementia without behavioral disturbance ([place]) 02/28/2019  . Cerebrovascular disease 02/28/2019  . Visual field cut 02/28/2019  . Slow transit constipation 02/28/2019  . Rectal bleeding 02/28/2019  . Venous insufficiency 07/15/2016  . Visual hallucinations 07/15/2016  . OAB (overactive bladder) 07/15/2016  . Cellulitis 07/09/2016  . Cellulitis of right lower extremity 07/09/2016  . Neck pain 01/15/2016  . Left cervical lymphadenopathy 01/08/2016  . Cough 06/09/2015  . Dyspnea 06/09/2015  . Acute bronchitis 06/09/2015  . Allergic rhinitis 04/15/2015  . Syncope and collapse 01/10/2015  . Contusion of left supra orbit 01/10/2015  . Laceration of right eyebrow 01/10/2015  . DOE (dyspnea on exertion) 01/10/2015  . Exertional chest pain 01/10/2015  . Fatigue 01/10/2015  . Acute hyperglycemia 01/10/2015  . Hemorrhoids 10/28/2013  . Vitamin D deficiency 10/28/2013  . Painful lumpy right breast  12/06/2012  . HLD (hyperlipidemia) 12/06/2012  . Essential hypertension, benign 12/06/2012  . Malignant neoplasm of breast (female), unspecified site   . Mild cognitive impairment with memory loss 08/20/2012  . Gait disorder 08/20/2012  . Wound, open, head 08/20/2012  . GERD (gastroesophageal reflux disease) 08/20/2012   Past Medical History:  Past Medical History:  Diagnosis Date  . Amaurosis fugax   . Anxiety   . Anxiety state, unspecified   . Bunion   . Cataracts, bilateral   . Sherran Needs syndrome   . Cognitive decline   . Contact dermatitis and other eczema due to other chemical products   . Edema   . External hemorrhoids without mention of complication   . GERD (gastroesophageal reflux disease)   . Glaucoma   . Hypertension   . Macular degeneration of both eyes   . Malignant neoplasm of breast (female), unspecified site dx'd 1996   rt breast; xrt   . Pain in joint, pelvic region and thigh   . Rash and other nonspecific skin eruption   . Stroke (cerebrum) ([place])    with decrease in [facility] of right hand.   . Stroke ([place])   . Unspecified cataract   . Unspecified late effects of cerebrovascular disease   . Viral hepatitis A without mention of hepatic coma   . Viral hepatitis A without mention of hepatic coma   . Vitamin D deficiency    Past Surgical History:  Past Surgical History:  Procedure Laterality Date  . BREAST LUMPECTOMY  1995  . CARDIAC CATHETERIZATION  2004  . CHOLECYSTECTOMY  1972    Assessment / Plan / Recommendation  Clinical Impression This writer conducted chart review as well as consulted interdisciplinary team with no explanation found as to why pt is consuming dysphagia 3 diet. Pt doesn't present with any history of oropharyngeal dysphagia. As such, BSE completed today which confirmed functional oropharyngeal abilities and no present risk of aspiration. Given pt's visual deficits, will place order for regular diet with finger food preference to aid in  pt holding food items as well as promoting functional independence instead of needing assistance with feeding. Additionally, I am hopeful that if pt is holding a food item (such a sandwich) she will consume more because there will be a tangible finish (such as completing sandwich - vs unknown how much is left on plate that she can't visually see. This Probation officer spoke with pt's nurse and voices support of plan. Information posted in pt's room and ST to sign off for dysphagia services but will continue to target cognitive linguistic deficits.    Skilled Therapeutic Interventions          Skilled treatment session focused on cognition goals. SLP facilitated session by providing Mod A cues for recall of orientation information (specific strategies that were helpful were judging between 2 seasons, then between 2 months and 2 days of the week). Pt able to recall immediately recall and then recall after 10 minutes with more than a reasonable amount of time. SLP also facilitated session by providing Min A cues for selective attention to self-feeding for 10 minutes in mildly distracting setting but as distractions increased pt required Mod A cues for selective attention. Pt able to self-feed when holding items in her handand was able to consume more of items d/t sensory feedback of holding items (such as graham cracker).  Pt left upright in bed, bed alarm on and all needs within reach.    SLP Assessment  Patient will need skilled Speech Lanaguage Pathology Services during CIR admission    Recommendations  SLP Diet Recommendations: Age appropriate regular solids;Thin Liquid Administration via: Cup;Straw Medication Administration: Whole meds with liquid Supervision: Patient able to self feed;Staff to assist with self feeding(d/t vision deficits) Compensations: Minimize environmental distractions;Slow rate;Small sips/bites Postural Changes and/or Swallow Maneuvers: Seated upright 90 degrees Oral Care Recommendations:  Oral care BID Patient destination: [place] (SNF) Follow up Recommendations: Skilled Nursing facility Equipment Recommended: None recommended by SLP    SLP Frequency 3 to 5 out of 7 days   SLP Duration  SLP Intensity  SLP Treatment/Interventions pending SNF placement  Minumum of 1-2 x/day, 30 to 90 minutes  Cognitive remediation/compensation;Cueing hierarchy;Functional tasks;Internal/external aids;Patient/family education;Speech/Language facilitation    Pain  N/A  Prior Functioning  See H&P  Short Term Goals: Week 2: SLP Short Term Goal 1 (Week 2): Pt will utilizie memory compensatory strategies to aid in recall of novel, daily information with mod A verbal cues. SLP Short Term Goal 2 (Week 2): Pt will complete functional basic problem solving tasks pertaining to ADLs with Supervision A verbal cues. SLP Short Term Goal 3 (Week 2): Pt will demonstrate self-awareness and self-correction of functional errors in problem solving tasks with min A verbal cues. SLP Short Term Goal 4 (Week 2): Pt will demonstrate selective attention in midly distracting environment for 15 minute intervals with Supervision A verbal cues for redirection. SLP Short Term Goal 5 (Week 2): Pt will demonstrate orientation x4 with Min A verbal cues. SLP Short Term Goal 6 (Week 2): Pt will demonstrate use of word finding strategies in structured and unstructured mildly  complex tasks with Supervision A semantic cues.  Refer to Care Plan for Long Term Goals  Recommendations for other services: None   Discharge Criteria: Patient will be discharged from SLP if patient refuses treatment 3 consecutive times without medical reason, if treatment goals not met, if there is a change in medical status, if patient makes no progress towards goals or if patient is discharged from hospital.  The above assessment, treatment plan, treatment alternatives and goals were discussed and mutually agreed upon: by  patient  Kenneth Cuaresma 03/26/2019, 9:41 AM

## 2019-03-27 ENCOUNTER — Inpatient Hospital Stay (HOSPITAL_COMMUNITY): Payer: Medicare Other

## 2019-03-27 ENCOUNTER — Inpatient Hospital Stay (HOSPITAL_COMMUNITY): Payer: Medicare Other | Admitting: Occupational Therapy

## 2019-03-27 ENCOUNTER — Inpatient Hospital Stay (HOSPITAL_COMMUNITY): Payer: Medicare Other | Admitting: Speech Pathology

## 2019-03-27 DIAGNOSIS — R0602 Shortness of breath: Secondary | ICD-10-CM

## 2019-03-27 MED ORDER — SENNA 8.6 MG PO TABS
2.0000 | ORAL_TABLET | Freq: Every day | ORAL | Status: DC
  Administered 2019-03-28 – 2019-04-03 (×7): 17.2 mg via ORAL
  Filled 2019-03-27 (×7): qty 2

## 2019-03-27 NOTE — Patient Care Conference (Signed)
Inpatient RehabilitationTeam Conference and Plan of Care Update Date: 03/27/2019   Time: 11:00 AM    Patient Name: Beth Foster      Medical Record Number: [id number]  Date of Birth: 03-19-1929 Sex: Female         Room/Bed: 4W17C/4W17C-01 Payor Info: Payor: Theme park manager MEDICARE / Plan: [facility] MEDICARE / Product Type: *No Product type* /    Admit Date/Time:  03/12/2019  4:38 PM  Primary Diagnosis:  ICH (intracerebral hemorrhage) ([place])  Patient Active Problem List   Diagnosis Date Noted  . SOB (shortness of breath)   . Acute lower UTI   . Leukopenia   . Orthostatic hypotension   . Labile blood pressure   . Dysphagia, post-stroke   . FUO (fever of unknown origin)   . Sleep disturbance   . Hypoalbuminemia due to protein-calorie malnutrition ([place])   . Urinary retention   . Benign essential HTN   . Essential hypertension 03/12/2019  . Hyperlipidemia 03/12/2019  . UTI (urinary tract infection) 03/12/2019  . Enterococcus UTI   . Macular degeneration   . Cognitive deficit, post-stroke   . Pressure injury of skin 03/09/2019  . ICH (intracerebral hemorrhage) (HCC) w/ IVH, L cerebellar 03/08/2019  . Vascular dementia without behavioral disturbance ([place]) 02/28/2019  . Cerebrovascular disease 02/28/2019  . Visual field cut 02/28/2019  . Slow transit constipation 02/28/2019  . Rectal bleeding 02/28/2019  . Venous insufficiency 07/15/2016  . Visual hallucinations 07/15/2016  . OAB (overactive bladder) 07/15/2016  . Cellulitis 07/09/2016  . Cellulitis of right lower extremity 07/09/2016  . Neck pain 01/15/2016  . Left cervical lymphadenopathy 01/08/2016  . Cough 06/09/2015  . Dyspnea 06/09/2015  . Acute bronchitis 06/09/2015  . Allergic rhinitis 04/15/2015  . Syncope and collapse 01/10/2015  . Contusion of left supra orbit 01/10/2015  . Laceration of right eyebrow 01/10/2015  . DOE (dyspnea on exertion) 01/10/2015  . Exertional chest pain 01/10/2015  . Fatigue 01/10/2015  .  Acute hyperglycemia 01/10/2015  . Hemorrhoids 10/28/2013  . Vitamin D deficiency 10/28/2013  . Painful lumpy right breast 12/06/2012  . HLD (hyperlipidemia) 12/06/2012  . Essential hypertension, benign 12/06/2012  . Malignant neoplasm of breast (female), unspecified site   . Mild cognitive impairment with memory loss 08/20/2012  . Gait disorder 08/20/2012  . Wound, open, head 08/20/2012  . GERD (gastroesophageal reflux disease) 08/20/2012    Expected Discharge Date: Expected Discharge Date: (NHP)  Team Members Present: Physician leading conference: Dr. Delice Lesch Social Worker Present: Ovidio Kin, LCSW Nurse Present: Isla Pence, RN Case manager: Karene Fry, RN PT Present: Michaelene Song, PT OT Present: Meriel Pica, OT SLP Present: Charolett Bumpers, SLP PPS Coordinator present : Gunnar Fusi, SLP     Current Status/Progress Goal Weekly Team Focus  Bowel/Bladder   pt is incontinent of B/B, pt is q4-6h bladder scan and in/out cath. pt is on timed toileting and able to void when placed on the toilet. LBM 11/3  continue timed toileting  offer timed toileting/ PRN   Swallow/Nutrition/ Hydration             ADL's   S UB bathing, min UB dressing, min LB bathing, mod LB dressing, min -mod toileting, min- mod sit to stand and transfers - her level of A fluctuates based on fatigue levels. Continues to c/o dizziness (BP WNL), and fatigue.  LTGs of bathing and toileting and transfers downgraded to min A.  Goals of dressing and stand balance to remain at min  A.  ADL training, functional transfers, balance/postural awareness, visual compensation, cognition, endurance, pt/family education   Mobility   min assist bed mobility, min-mod assist for sit<>stand and standing balance, pt with strong posterior lean, ambulates up to 50 ft with RW  supervision/min assist  transfers, gait, standing balance, endurance, strength, activity tolerance, awareness   Communication              Safety/Cognition/ Behavioral Observations  Min-Mod A (suspect close to baseline, no family present to confirm)  Min-Supervision A  selective attention, compensatory memory strategies, basic functional problem solving, safety awareness   Pain   pt c/o of lower back pain, pt has PRN tylenol  pain less than 5  Assess pain Qshift/ PRN   Skin   Pts skin has scattered bruising, on arms and legs.  Prevent skin breakdown and free fo infection.  Assess skin Qshift/ PRN      *See Care Plan and progress notes for long and short-term goals.     Barriers to Discharge  Current Status/Progress Possible Resolutions Date Resolved   Nursing                  PT  Decreased caregiver support  granddaughter works during the day              OT                  SLP                SW                Discharge Planning/Teaching Needs:  Plan has changed to NHP due to does not ahve 24 hr physical care at DC. Have begun looking for NH bed      Team Discussion: Recurrent UTI, SOB, ordered CXR, called GDtr and left message.  A+O, pale, SOB, CXR completed, inc/continent, voided this am, not eating well.  OT SOB, O2 sats 99-100%, fatigues easily, min/mod a, disoriented.  PT min/mod fluctuates, strong post lean, orthostatic Monday, thigh high TEDs on.  SLP word finding min a, cognition min/mod, receptive close to baseline, goals min/S.  Plan is for SNF post CIR.   Revisions to Treatment Plan: N/A     Medical Summary Current Status: Balance deficits with left lean as well as cognitive deficits affecting ADLs and mobility secondary to  left cerebellar hematoma with penetration of 4th ventricle and dependent within occipital horns and lateral ventricles Weekly Focus/Goal: Improve mobility, urinary retention, orthostasis, SOB, recurrent UTI  Barriers to Discharge: Behavior;Medical stability;Nutrition means   Possible Resolutions to Barriers: Therapies, CXR ordered, optimize bladder meds, follow vitals,  abx   Continued Need for Acute Rehabilitation Level of Care: The patient requires daily medical management by a physician with specialized training in physical medicine and rehabilitation for the following reasons: Direction of a multidisciplinary physical rehabilitation program to maximize functional independence : Yes Medical management of patient stability for increased activity during participation in an intensive rehabilitation regime.: Yes Analysis of laboratory values and/or radiology reports with any subsequent need for medication adjustment and/or medical intervention. : Yes   I attest that I was present, lead the team conference, and concur with the assessment and plan of the team.   [name] 03/27/2019, 8:34 PM  Team conference was held via web/ teleconference due to [place] - 19

## 2019-03-27 NOTE — Progress Notes (Signed)
[facility] PHYSICAL MEDICINE & REHABILITATION PROGRESS NOTE  Subjective/Complaints: Patient seen sitting up in bed this AM.  No reported issues overnight. She is more confused this AM.   ROS: Unreliable due to cognition.   Objective: Vital Signs: Blood pressure 120/63, pulse 71, temperature 98.1 F (36.7 C), temperature source Oral, resp. rate 20, height 5\' 5"  (1.651 m), weight 64.9 kg, SpO2 96 %. No results found. No results for input(s): WBC, HGB, HCT, PLT in the last 72 hours. Recent Labs    03/25/19 0522  NA 139  K 3.6  CL 104  CO2 25  GLUCOSE 126*  BUN 11  CREATININE 0.63  CALCIUM 9.1    Physical Exam: BP 120/63 (BP Location: Right Arm)   Pulse 71   Temp 98.1 F (36.7 C) (Oral)   Resp 20   Ht 5\' 5"  (1.651 m)   Wt 64.9 kg   SpO2 96%   BMI 23.81 kg/m   Constitutional: No distress . Vital signs reviewed. HENT: Normocephalic.  Atraumatic. Eyes: EOMI. No discharge. Cardiovascular: No JVD. Respiratory: Normal effort.  No stridor. GI: Non-distended. Skin: Warm and dry.  Intact. Psych: Normal mood.  Normal behavior. Musc: No edema in extremities.  No tenderness in extremities. Neurological:  Alert and oriented x1 Motor:  Right upper extremity: 4/5 proximal to distal, unchanged Left upper extremities: 4-4+/5 proximal distal, unchanged Right lower extremity: 4-4+/5 proximal distal, unchanged Left lower extremity: 4-4+/5 proximal to distal, unchanged  Assessment/Plan: 1. Functional deficits secondary to left cerebellar hematoma which require 3+ hours per day of interdisciplinary therapy in a comprehensive inpatient rehab setting.  Physiatrist is providing close team supervision and 24 hour management of active medical problems listed below.  Physiatrist and rehab team continue to assess barriers to discharge/monitor patient progress toward functional and medical goals  Care Tool:  Bathing    Body parts bathed by patient: Right upper leg, Left upper leg,  Face, Front perineal area, Abdomen, Chest, Left arm, Right arm, Buttocks   Body parts bathed by helper: Right lower leg, Left lower leg     Bathing assist Assist Level: Minimal Assistance - Patient > 75%     Upper Body Dressing/Undressing Upper body dressing   What is the patient wearing?: Bra, Button up shirt    Upper body assist Assist Level: Moderate Assistance - Patient 50 - 74%    Lower Body Dressing/Undressing Lower body dressing      What is the patient wearing?: Pants, Underwear/pull up     Lower body assist Assist for lower body dressing: Contact Guard/Touching assist     Toileting Toileting    Toileting assist Assist for toileting: Minimal Assistance - Patient > 75%     Transfers Chair/bed transfer  Transfers assist     Chair/bed transfer assist level: Minimal Assistance - Patient > 75%     Locomotion Ambulation   Ambulation assist      Assist level: Moderate Assistance - Patient 50 - 74% Assistive device: Walker-rolling Max distance: 15'   Walk 10 feet activity   Assist     Assist level: Moderate Assistance - Patient - 50 - 74% Assistive device: Walker-rolling   Walk 50 feet activity   Assist Walk 50 feet with 2 turns activity did not occur: Safety/medical concerns(Per report )         Walk 150 feet activity   Assist Walk 150 feet activity did not occur: Safety/medical concerns(Per report )         Walk  10 feet on uneven surface  activity   Assist Walk 10 feet on uneven surfaces activity did not occur: Safety/medical concerns(Per report)         Wheelchair     Assist   Type of Wheelchair: Manual    Wheelchair assist level: Minimal Assistance - Patient > 75% Max wheelchair distance: 25    Wheelchair 50 feet with 2 turns activity    Assist    Wheelchair 50 feet with 2 turns activity did not occur: Safety/medical concerns(fatigue)       Wheelchair 150 feet activity     Assist Wheelchair 150  feet activity did not occur: Safety/medical concerns(fatigue)          Medical Problem List and Plan: 1. Balance deficits with left lean as well as cognitive deficits affecting ADLs and mobility secondary to  left cerebellar hematoma with penetration of 4th ventricle and dependent within occipital horns and lateral ventricles  Continue CIR  Call patient's daughter to update and discuss discharge plans as well as goals of care.  No answer, left voicemail for call back.  Team conference today to discuss current and goals and coordination of care, home and environmental barriers, and discharge planning with nursing, case manager, and therapies.  2.  Antithrombotics: -DVT/anticoagulation:  Pharmaceutical: Lovenox             -antiplatelet therapy: N/A 3. HA/back pain/Pain Management: Continue tylenol prn   Lidoderm patch ordered on 11/2 4. Mood: LCSW to follow for evaluation and support.              -antipsychotic agents: N/A 5. Neuropsych: This patient is not fully capable of making decisions on her own behalf. 6. Skin/Wound Care: Routine pressure relief measures.  Maintain adequate nutrition and hydration status. 7. Fluids/Electrolytes/Nutrition:      -Offer nutritional supplements  Relatively poor p.o. intake on 11/4, however taking supplements and slightly improved overall  BMP within acceptable range on 11/2, except for glucose 8.  HTN: Monitor   Cardizem daily, decreased to 180 on 10/22   Controlled on 11/4  Orthostatics positive on 11/3 9.  Macular degeneration: Visual hallucinations have been worsening.  Exacerbated by bilateral cataracts as well as elevated pressures left eye.    Started Xalatan (new RX per family)  30. Urinary retention:   Flomax started on 10/23, increased on 10/26  Bethanechol started on 10/29, increased again on 11/2  Continue I/O's, discussed with nursing use of coude caths, discussed with patient as well.  ?Improving on 11/4 11. GERD: Continue  Protonix. 12.  Hypoalbuminemia  Supplement initiated on 10/21 13. Constipation, now with incontinence  Bowel meds increased on 10/21  Improving 14.  Post stroke dysphagia  D3 thins, advance as tolerated 15. FUO: Resolved  UA equivocal, Ucx <10k insig growth, wbc's 4.7, afebrile since 10/22  Chest x-ray personally reviewed, unremarkable for infection 16.  Sleep disturbance  Sleep chart  Ambien started 10/23 with some benefit, increased on 10/27  Improving overall 17.  Prediabetes  Elevated on 11/2 18.  Leukopenia: Resolved  WBCs 4.2 on 10/28  Continue to monitor 19.  Acute lower UTI-Enterococcus faecalis  Macrobid started on 11/2 20. SOB  O2 stats stable per nursing  Will order CXR  LOS: 15 days A FACE TO FACE EVALUATION WAS PERFORMED  Beth Foster Beth Foster 03/27/2019, 8:55 AM

## 2019-03-27 NOTE — Progress Notes (Signed)
Occupational Therapy Session Note  Patient Details  Name: Beth Foster MRN: [id number] Date of Birth: 09-22-1928  Today's Date: 03/27/2019 OT Individual Time: [id number] OT Individual Time Calculation (min): 55 min    Short Term Goals: Week 1:  OT Short Term Goal 1 (Week 1): Pt will complete UB dressing with min assist for bra and button up shirt. OT Short Term Goal 1 - Progress (Week 1): Progressing toward goal OT Short Term Goal 2 (Week 1): Pt will complete LB bathing sit to stand with min assist. OT Short Term Goal 2 - Progress (Week 1): Progressing toward goal OT Short Term Goal 3 (Week 1): Pt will complete LB dressing sit to stand with min assist. OT Short Term Goal 3 - Progress (Week 1): Progressing toward goal OT Short Term Goal 4 (Week 1): Pt will ambulate to the toilet with min assist from wheelchari or EOB with RW. OT Short Term Goal 4 - Progress (Week 1): Progressing toward goal Week 2:  OT Short Term Goal 1 (Week 2): Continue working towards week 1 STGs.  Skilled Therapeutic Interventions/Progress Updates:    Pt received in bed finishing coffee and c/o of back burning from muscle rub cream.  Pt sat up to EOB using rails with S and extra time. "Well I just got here last night and I thought I was supposed to leave with that family this morning." Pt reoriented to place/ situation. She did know month, year and city/ hospital.    Pt did need to toilet, stood with min A to rW and ambulated to toilet.  Out of breath upon sitting. She did void B and B.  Loose stool.  Pt was able to stand with min A and self cleanse.  She donned pants over feet with cues and pulled over hips herself with min A for balance due to posterior lean.    Pt then very out of breath. Rested for a few min then transitioned to arm chair by sink with RW with more support as she c/o fatigue and dizziness. Rested then cleaned dentures and brushed hair. C/o headache. BP 115/77, HR 88   Pt need to go to xray so stood  with RW to step to w/c. One Posterior LOB with min A to recover.   Pt in w/c with transport tech with her.    Therapy Documentation Precautions:  Precautions Precautions: Fall Restrictions Weight Bearing Restrictions: No    Vital Signs: Therapy Vitals Pulse Rate: 71 Resp: 20 BP: 120/63 Patient Position (if appropriate): Lying Oxygen Therapy SpO2: 96 % O2 Device: Room Air Pain: Pain Assessment Pain Scale: 0-10 Pain Score: 0-No pain(premedicated before therapy) ADL: ADL Eating: Set up Where Assessed-Eating: Edge of bed Grooming: Setup Where Assessed-Grooming: Wheelchair Upper Body Bathing: Supervision/safety Where Assessed-Upper Body Bathing: Other (Comment)(toilet) Lower Body Bathing: Contact guard Where Assessed-Lower Body Bathing: Other (Comment)(toilet) Upper Body Dressing: Minimal assistance Where Assessed-Upper Body Dressing: Other (Comment)(toilet) Lower Body Dressing: Minimal assistance Where Assessed-Lower Body Dressing: Other (Comment)(toilet) Toileting: Minimal assistance Where Assessed-Toileting: Glass blower/designer: Minimal Print production planner Method: Stand pivot, Insurance claims handler Equipment: Raised toilet seat   Therapy/Group: Individual Therapy  SAGUIER,JULIA 03/27/2019, 9:05 AM

## 2019-03-27 NOTE — Plan of Care (Signed)
  Problem: RH Memory Goal: LTG Patient will use memory compensatory aids to (SLP) Description: LTG:  Patient will use memory compensatory aids to recall biographical/new, daily complex information with cues (SLP) Flowsheets (Taken 03/27/2019 1509) LTG: Patient will use memory compensatory aids to (SLP): Moderate Assistance - Patient 50 - 74%  Downgraded due to slow progress and baseline memory deficits

## 2019-03-27 NOTE — Progress Notes (Signed)
Speech Language Pathology Weekly Progress and Session Note  Patient Details  Name: Beth Foster MRN: [id number] Date of Birth: 17-Aug-1928  Beginning of progress report period: March 20, 2019 End of progress report period: March 27, 2019  Today's Date: 03/27/2019 SLP Individual Time: 1400-1456 SLP Individual Time Calculation (min): 56 min  Short Term Goals: Week 2: SLP Short Term Goal 1 (Week 2): Pt will utilizie memory compensatory strategies to aid in recall of novel, daily information with mod A verbal cues. SLP Short Term Goal 1 - Progress (Week 2): Met SLP Short Term Goal 2 (Week 2): Pt will complete functional basic problem solving tasks pertaining to ADLs with Supervision A verbal cues. SLP Short Term Goal 2 - Progress (Week 2): Progressing toward goal SLP Short Term Goal 3 (Week 2): Pt will demonstrate self-awareness and self-correction of functional errors in problem solving tasks with min A verbal cues. SLP Short Term Goal 3 - Progress (Week 2): Progressing toward goal SLP Short Term Goal 4 (Week 2): Pt will demonstrate selective attention in midly distracting environment for 15 minute intervals with Supervision A verbal cues for redirection. SLP Short Term Goal 4 - Progress (Week 2): Progressing toward goal SLP Short Term Goal 5 (Week 2): Pt will demonstrate orientation x4 with Min A verbal cues. SLP Short Term Goal 5 - Progress (Week 2): Met SLP Short Term Goal 6 (Week 2): Pt will demonstrate use of word finding strategies in structured and unstructured mildly complex tasks with Supervision A semantic cues. SLP Short Term Goal 6 - Progress (Week 2): Met    New Short Term Goals: Week 3: SLP Short Term Goal 1 (Week 3): Pt will utilizie memory compensatory strategies to aid in recall of novel, daily information with Min A verbal cues. SLP Short Term Goal 2 (Week 3): Pt will complete functional basic problem solving tasks pertaining to ADLs with Supervision A verbal cues. SLP  Short Term Goal 3 (Week 3): Pt will demonstrate self-awareness and self-correction of functional errors in problem solving tasks with min A verbal cues. SLP Short Term Goal 4 (Week 3): Pt will demonstrate selective attention in midly distracting environment for 15 minute intervals with Supervision A verbal cues for redirection.  Weekly Progress Updates: Pt has made inconsistent progress this week due to fluctuating lethargy and confusion. She met 3 out of 6 short term goals. Pt is currently anywhere from [place] assist for basic cognition due to impairments impacting short term memory/day to day recall, basic problem solving, attention, and judgement. Pt has demonstrated improved orientation, sequencing, and basic problem solving. Pt is also Supervision A for word finding at the conversation level - goals in that area have been met. Pt education is ongoing, however no family has been present during [place]. Pt's family is realistic about pt's level of physical and cognitive assistance and do not believe they will be able to provide it at home; therefore, pt is awaiting SNF placement. Pt would continue to benefit from skilled ST while inpatient in order to maximize functional independence and reduce burden of care prior to discharge. She will need 24/7 supervision and assistance.     Intensity: Minumum of 1-2 x/day, 30 to 90 minutes Frequency: 3 to 5 out of 7 days Duration/Length of Stay: pending SNF placement Treatment/Interventions: Cognitive remediation/compensation;Cueing hierarchy;Functional tasks;Internal/external aids;Patient/family education;Environmental controls   Daily Session  Skilled Therapeutic Interventions: Pt was seen for skilled ST targeting cognitive skills. SLP facilitated session with a familiar card game (accomodations made  for pt's visual impairments. Total A required for recall of instructions, however only Supervision A verbal cues provided for working memory to verbally identify  the higher value card (from field of 2). Throughout session pt made comments about being "in the house" and "in the other place last night" that required SLP's Min A verbal cues to reorient to the hospital. Pt independently oriented to season, however Min A verbal cues required for identification of month and date.  Remainder of session focused on functional basic problem solving during ADLs, such as donning and doffing clothes, completing stand pivot transfers from wheelchair to toilet and wheelchair to bed, washing hands, etc. Pt required fluctuating Supervision to Min A multimodal cues for basic problem solving during ADLs. Min-Mod A provided for reasoning and recall of safety precautions. Of note, pt was continent of bowel and bladder during session. Pt was left laying in bed with alarm set and all needs within reach, RN present. Continue per current plan of care.       Pain Pain Assessment Pain Score: 0-No pain  Therapy/Group: Individual Therapy  Arbutus Leas 03/27/2019, 12:38 PM

## 2019-03-27 NOTE — Progress Notes (Signed)
Physical Therapy Session Note  Patient Details  Name: Beth Foster MRN: [id number] Date of Birth: 23-Sep-1928  Today's Date: 03/27/2019 PT Individual Time:1030-1130;  1300-1330 PT Individual Time Calculation (min): 60, 30 min   Short Term Goals:  Week 2:  PT Short Term Goal 1 (Week 2): Pt will perform supine<>sit consistently with CGA PT Short Term Goal 2 (Week 2): Pt will perform sit<>stands using LRAD with min assist consistently PT Short Term Goal 3 (Week 2): Pt will perform bed<>chair transfers using LRAD with min assist consistently PT Short Term Goal 4 (Week 2): Pt will ambulate 52ft using LRAD with min assist      Skilled Therapeutic Interventions/Progress Updates:  Tx1: Pt sitting in wc, crying,  asking for help. Pt confused, thinking someone had brought her back to the wrong room from Xray.  PT re-oriented pt with limited success.  Pt knew year and month.  She calmed down with assurance that this PT knew her.  Pt doffed shoes; PT doffed socks and donned thigh high TEDS and shoes.  neuromuscular re-education via multimodal cues and forced use for alternating reciprocal movement bil LEs, using kinetron from w/c at level 50 cm/sec, x 25 cycles x 3.  Use of bil UEs to propel w/c x 30' with supervision, cues for visual clues, (as below).  For timed toileting, pt taken to toilet in her room  Toilet transfer with min assist.  Pt managed clothes with cues, min assist for balance.  Pt sat on toilet but did not void; recorded on chart in her room.  Gait training with RW x 80' with CGA/min assist, using visual cues of floor tiles to maintain straight trajectory.  Sit>< stand with CGA.  Stand pivot to recliner with min assist.  At end of session, pt resting in recliner with needs at hand , bil LEs elevated and back reclined.  Seat belt alarm set.  tx 2:  Pt finishing up lunch seated in recliner. No c/o pain.  neuromuscular re-education via demo and multimodal cues for seated: 10 x 1  marching, bil heel raises, R/L long arc quad iwht ankle pumps at end range, bil mini squats.  Sit> stand with min assist due to LOB posteriorly upon standing.   Gait training with CGA/min , RW, x 150' .  At end of session, pt in w/c with needs at hand, seat belt alarm set, eating ice cream.        Therapy Documentation Precautions:  Precautions Precautions: Fall Restrictions Weight Bearing Restrictions: No    Pain: Pain Assessment Pain Score: 0-No pain AM or PM       Therapy/Group: Individual Therapy  Gerrad Welker 03/27/2019, 4:22 PM

## 2019-03-28 ENCOUNTER — Inpatient Hospital Stay (HOSPITAL_COMMUNITY): Payer: Medicare Other

## 2019-03-28 ENCOUNTER — Inpatient Hospital Stay (HOSPITAL_COMMUNITY): Payer: Medicare Other | Admitting: Occupational Therapy

## 2019-03-28 DIAGNOSIS — N390 Urinary tract infection, site not specified: Secondary | ICD-10-CM

## 2019-03-28 MED ORDER — MELATONIN 3 MG PO TABS
1.5000 mg | ORAL_TABLET | Freq: Every day | ORAL | Status: DC
  Administered 2019-03-28 – 2019-04-03 (×7): 1.5 mg via ORAL
  Filled 2019-03-28 (×8): qty 0.5

## 2019-03-28 NOTE — Progress Notes (Signed)
Physical Therapy Session Note  Patient Details  Name: Beth Foster MRN: [id number] Date of Birth: 04/12/1929  Today's Date: 03/28/2019 PT Individual Time: 0800-0905 ; 1535-1600 PT Individual Time Calculation (min): 65 min , 25 min  Short Term Goals: Week 2:  PT Short Term Goal 1 (Week 2): Pt will perform supine<>sit consistently with CGA PT Short Term Goal 2 (Week 2): Pt will perform sit<>stands using LRAD with min assist consistently PT Short Term Goal 3 (Week 2): Pt will perform bed<>chair transfers using LRAD with min assist consistently PT Short Term Goal 4 (Week 2): Pt will ambulate 81ft using LRAD with min assist     Skilled Therapeutic Interventions/Progress Updates:   tx 1: Pt in bed, with HOB raised by NT.  She c/o of mild frontal HA but declined meds.  She stated that she needed to use toilet.  PT donned TEDS and non slip socks.  Using bed features, supine> sit with CGA and VCs.  "warm up" exs sitting EOB: 10 x 1 R/L long arc quad knee extensions, ankle pumps, marching.  Sit> stand from EOB with CGA.  Pt had LOB backwards upon standing, requiring min assist to regain.  Pt stated that she felt shaky.   Gait training with RW into bathroom, CGA.  Toilet transfer using wall bar and RW min assist.  Pt doffed brief while PT provided min assist for balance. Pt continent of B and B.  After several attempts, mod assist to arise from toilet, with bil hand tremors noted.  W/c used to exit bathroom.  PT set up for breakfast and encouraged to eat.  Pt doffed non slip socks and donned shoes with set up. PT tied them.  Sit> stand with CGA, max cues for technique.  Gait training with RW on level tile x 100' including turn, min assist.  At end of session, pt resting reclined in recliner with bil LEs elevated, needs at hand and seat belt alarm set.  tx 2:  Pt dozing in bed.  Granddaughter Vicente Males present. Pt stated that she was exhausted and sleepy but wiling to start session in bed.    neuromuscular re-education via demo and multimodal cues for supine:  20 x 1 bil lower trunk rotation; 10 x 2 bil bridging; L/R side lying: 10 x 1 hip abduction with flexed hips and knees, bil hip flexion/extension.    Bed mobility in flat bed, no rails, via segmental lateral scooting, facilitating upper/lower trunk dissociation and movement in bed before rolling, CGA.  Rolling L><R with cues in flat bed, no rails.  Pt shivering in bed.  PT provided extra blankets and asked Erin, NT to check pt's temperature.  At end of session, pt in bed with bed alarm set and soft call bell at hand.     Therapy Documentation Precautions:  Precautions Precautions: Fall Restrictions Weight Bearing Restrictions: No         Therapy/Group: Individual Therapy  Emmelia Holdsworth 03/28/2019, 9:52 AM

## 2019-03-28 NOTE — Plan of Care (Signed)
  Problem: Consults Goal: RH STROKE PATIENT EDUCATION Description: See Patient Education module for education specifics  Outcome: Progressing   Problem: RH BOWEL ELIMINATION Goal: RH STG MANAGE BOWEL WITH ASSISTANCE Description: STG Manage Bowel with min Assistance. Outcome: Progressing Goal: RH STG MANAGE BOWEL W/MEDICATION W/ASSISTANCE Description: STG Manage Bowel with Medication with min Assistance. Outcome: Progressing   Problem: RH BLADDER ELIMINATION Goal: RH STG MANAGE BLADDER WITH ASSISTANCE Description: STG Manage Bladder With min Assistance Outcome: Progressing Goal: RH STG MANAGE BLADDER WITH MEDICATION WITH ASSISTANCE Description: STG Manage Bladder With Medication With min Assistance. Outcome: Progressing   Problem: RH SKIN INTEGRITY Goal: RH STG MAINTAIN SKIN INTEGRITY WITH ASSISTANCE Description: STG Maintain Skin Integrity With mod I Assistance. Outcome: Progressing   Problem: RH SAFETY Goal: RH STG DECREASED RISK OF FALL WITH ASSISTANCE Description: STG Decreased Risk of Fall With cues and reminder Assistance. Outcome: Progressing   Problem: RH COGNITION-NURSING Goal: RH STG ANTICIPATES NEEDS/CALLS FOR ASSIST W/ASSIST/CUES Description: STG Anticipates Needs/Calls for Assist With cues and reminders Assistance/Cues. Outcome: Progressing   Problem: RH PAIN MANAGEMENT Goal: RH STG PAIN MANAGED AT OR BELOW PT'S PAIN GOAL Description: Pain level less than 3 on scale of 0-10 Outcome: Progressing   Problem: RH KNOWLEDGE DEFICIT Goal: RH STG INCREASE KNOWLEDGE OF HYPERTENSION Description: Pt will be able to adhere to medication regimen for blood pressure control with min assist from family. Pt will demonstrate safety precaution to take upon discharge to prevent falls with cues and reminders from family. Outcome: Progressing   Problem: RH Vision Goal: RH LTG Vision (Specify) Outcome: Progressing

## 2019-03-28 NOTE — Progress Notes (Signed)
Speech Language Pathology Daily Session Note  Patient Details  Name: Beth Foster MRN: [id number] Date of Birth: 06-Apr-1929  Today's Date: 03/28/2019 SLP Individual Time: 1333-1400 SLP Individual Time Calculation (min): 27 min  Short Term Goals: Week 3: SLP Short Term Goal 1 (Week 3): Pt will utilizie memory compensatory strategies to aid in recall of novel, daily information with Min A verbal cues. SLP Short Term Goal 2 (Week 3): Pt will complete functional basic problem solving tasks pertaining to ADLs with Supervision A verbal cues. SLP Short Term Goal 3 (Week 3): Pt will demonstrate self-awareness and self-correction of functional errors in problem solving tasks with min A verbal cues. SLP Short Term Goal 4 (Week 3): Pt will demonstrate selective attention in midly distracting environment for 15 minute intervals with Supervision A verbal cues for redirection.  Skilled Therapeutic Interventions: Skilled ST services focused on cognitive skills. Pt demonstrated recall of therapy events with mod A verbal cues and total A verbal cues to recall rules from WAR (yesterday's ST session task.) Pt was orientated to place, situation and year with intermittent confusion about her "new room", Pt's room has not changed. Pt recalled safety precaution from transfer from chair to bed with RW with mod A verbal cues. Pt demonstrated transfer from chair to bed with RW with min A verbal cues for use of precautions, expect demonstrated reduced balancing leaning backwards and falling into chair on initial attempt. Pt was able to recall call bell precautions, however unable to locate call button on remote, likely due to vision deficits, therefore SLP added soft touch call bell, pt returned demonstration. Pt was left in room with call bell within reach and bed alarm set. ST recommends to continue skilled ST services.      Pain Pain Assessment Pain Score: 0-No pain  Therapy/Group: Individual Therapy  Traver Meckes   [facility] 03/28/2019, 4:03 PM

## 2019-03-28 NOTE — Progress Notes (Signed)
Occupational Therapy Session Note  Patient Details  Name: Beth Foster MRN: [id number] Date of Birth: 10-26-1928  Today's Date: 03/28/2019 OT Individual Time: 4888-9169 OT Individual Time Calculation (min): 60 min    Short Term Goals: Week 1:  OT Short Term Goal 1 (Week 1): Pt will complete UB dressing with min assist for bra and button up shirt. OT Short Term Goal 1 - Progress (Week 1): Progressing toward goal OT Short Term Goal 2 (Week 1): Pt will complete LB bathing sit to stand with min assist. OT Short Term Goal 2 - Progress (Week 1): Progressing toward goal OT Short Term Goal 3 (Week 1): Pt will complete LB dressing sit to stand with min assist. OT Short Term Goal 3 - Progress (Week 1): Progressing toward goal OT Short Term Goal 4 (Week 1): Pt will ambulate to the toilet with min assist from wheelchari or EOB with RW. OT Short Term Goal 4 - Progress (Week 1): Progressing toward goal Week 2:  OT Short Term Goal 1 (Week 2): Continue working towards week 1 STGs.  Skilled Therapeutic Interventions/Progress Updates:    Pt received in recliner expressing some confusion about what she was doing in the hospital and who the staff were. She thought I was her granddaughter's friend.  ADL Retraining:   Pt was agreeable to a shower today.  Min A with toilet, shower stand pivot transfers, min for bathing with standing to wash bottom and A with feet, CGA with toileting. Dressed from wc with min A overall - max for socks and shoes.  Balance: increased posterior lean today with one LOB standing at toilet with mod A to recover. MIn A ambulating to toilet and then to shower with RW.  Neuromuscular Re-Education: Focused on posterior lean today using visual reference.  Each time pt stood all of her weight was on her heels with knees bent and hips posterior to knees so she could not hold stand without support.  Cued pt to lean towards me as if she wanted to whisper something to me.  This helped but  constant cues needed.  Worked on this strategy more with dancing to 1940s music.  Cued pt to lean into me and hands positioned in dancing pose.  Pt practiced weight shifting side to side and taking faster smoother steps forward. Improved balance with decreased lean but she got dizzy easily.  Rested and then repeated exercise.    Pt became fatigued so had her sit in recliner to rest. Belt alarm on and all needs met.      Therapy Documentation Precautions:  Precautions Precautions: Fall Restrictions Weight Bearing Restrictions: No  Pain: no c/o pain   ADL: ADL Eating: Set up Where Assessed-Eating: Edge of bed Grooming: Setup Where Assessed-Grooming: Wheelchair Upper Body Bathing: Supervision/safety Where Assessed-Upper Body Bathing: Other (Comment)(toilet) Lower Body Bathing: Contact guard Where Assessed-Lower Body Bathing: Other (Comment)(toilet) Upper Body Dressing: Minimal assistance Where Assessed-Upper Body Dressing: Other (Comment)(toilet) Lower Body Dressing: Minimal assistance Where Assessed-Lower Body Dressing: Other (Comment)(toilet) Toileting: Contact guard Where Assessed-Toileting: Glass blower/designer: Minimal Print production planner Method: Stand pivot, Insurance claims handler Equipment: Raised toilet seat   Therapy/Group: Individual Therapy  [place] 03/28/2019, 8:33 AM

## 2019-03-28 NOTE — Progress Notes (Signed)
Social Work Patient ID: Beth Foster, female   DOB: 04/08/1929, 85 y.o.   MRN: [id number]   Met with pt and spoke with Anna-granddaughter to inform of team conference progress toward her goals and plan. Pt is not progressing as quickly and goals have been downgraded to min-mod level assist. Granddaughter can not provide this level of assist due to job and family. Will begin NH search and granddaughter to talk with pt regarding this.  

## 2019-03-28 NOTE — Plan of Care (Signed)
  Problem: RH Car Transfers Goal: LTG Patient will perform car transfers with assist (PT) Description: LTG: Patient will perform car transfers with assistance (PT). Outcome: Not Applicable Flowsheets (Taken 03/28/2019 1628) LTG: Pt will perform car transfers with assist:: (discharge goal) -- Note: D/d to SNF   Problem: RH Stairs Goal: LTG Patient will ambulate up and down stairs w/assist (PT) Description: LTG: Patient will ambulate up and down # of stairs with assistance (PT) Outcome: Not Applicable Flowsheets (Taken 03/28/2019 1628) LTG: Pt will ambulate up/down stairs assist needed:: (discharge goal) -- Note: D/c to SNF   Problem: RH Balance Goal: LTG Patient will maintain dynamic standing balance (PT) Description: LTG:  Patient will maintain dynamic standing balance with assistance during mobility activities (PT) Flowsheets (Taken 03/28/2019 1628) LTG: Pt will maintain dynamic standing balance during mobility activities with:: (downgraded) Minimal Assistance - Patient > 75% Note: Due to slow progress   Problem: Sit to Stand Goal: LTG:  Patient will perform sit to stand with assistance level (PT) Description: LTG:  Patient will perform sit to stand with assistance level (PT) Flowsheets (Taken 03/28/2019 1628) LTG: PT will perform sit to stand in preparation for functional mobility with assistance level: (downgraded) Minimal Assistance - Patient > 75% Note: Due to slow progress   Problem: RH Bed to Chair Transfers Goal: LTG Patient will perform bed/chair transfers w/assist (PT) Description: LTG: Patient will perform bed to chair transfers with assistance (PT). Flowsheets (Taken 03/28/2019 1628) LTG: Pt will perform Bed to Chair Transfers with assistance level: (downgraded) Minimal Assistance - Patient > 75% Note: Due to slow progress   Problem: RH Furniture Transfers Goal: LTG Patient will perform furniture transfers w/assist (OT/PT) Description: LTG: Patient will perform furniture  transfers  with assistance (OT/PT). Flowsheets (Taken 03/28/2019 1628) LTG: Pt will perform furniture transfers with assist:: (downgraded) Minimal Assistance - Patient > 75% Note: Due to slow progress   Problem: RH Ambulation Goal: LTG Patient will ambulate in controlled environment (PT) Description: LTG: Patient will ambulate in a controlled environment, # of feet with assistance (PT). Flowsheets (Taken 03/28/2019 1628) LTG: Pt will ambulate in controlled environ  assist needed:: (downgraded) Minimal Assistance - Patient > 75% Note: Due to slow progress Goal: LTG Patient will ambulate in home environment (PT) Description: LTG: Patient will ambulate in home environment, # of feet with assistance (PT). Flowsheets (Taken 03/28/2019 1628) LTG: Pt will ambulate in home environ  assist needed:: (downgraded) Minimal Assistance - Patient > 75% LTG: Ambulation distance in home environment: 53' Note: Due to slow progress

## 2019-03-28 NOTE — Progress Notes (Signed)
Physical Therapy Weekly Progress Note  Patient Details  Name: Beth Foster MRN: [id number] Date of Birth: [birth year]  Beginning of progress report period: 03/22/19 End of progress report period: 03/28/19   Patient has met 3 of 4 short term goals.  She is limited by pre-existing low vision, dizziness/vertigo- central effect, pre-existing scoliosis and leg length discrepancy.   Patient continues to demonstrate the following deficits muscle weakness and muscle joint tightness, decreased cardiorespiratoy endurance, impaired timing and sequencing, decreased visual acuity, decreased memory and decreased sitting balance, decreased standing balance, decreased postural control, hemiplegia and decreased balance strategies and therefore will continue to benefit from skilled PT intervention to increase functional independence with mobility.  Patient not progressing toward all ong term goals.  See goal revision..  Plan of care revisions: car transfer and stairs goals discharged; most others downgraded to min assist due to continuing multifactorial balance defcits.  PT Short Term Goals  Week 2:  PT Short Term Goal 1 (Week 2): Pt will perform supine<>sit consistently with CGA PT Short Term Goal 1 - Progress (Week 2): Partly met PT Short Term Goal 2 (Week 2): Pt will perform sit<>stands using LRAD with min assist consistently PT Short Term Goal 2 - Progress (Week 2): Met PT Short Term Goal 3 (Week 2): Pt will perform bed<>chair transfers using LRAD with min assist consistently PT Short Term Goal 3 - Progress (Week 2): Met PT Short Term Goal 4 (Week 2): Pt will ambulate 67f using LRAD with min assist PT Short Term Goal 4 - Progress (Week 2): Met     Skilled Therapeutic Interventions/Progress Updates:  Ambulation/gait training;Discharge planning;Functional mobility training;Psychosocial support;Therapeutic Activities;Visual/perceptual remediation/compensation;Balance/vestibular training;Neuromuscular  re-education;Therapeutic Exercise;Wheelchair propulsion/positioning;Cognitive remediation/compensation;DME/adaptive equipment instruction;UE/LE Strength taining/ROM;Patient/family education;UE/LE Coordination activities   Therapy Documentation Precautions:  Precautions Precautions: Fall Restrictions Weight Bearing Restrictions: No  Pain: Pain Assessment Pain Score: 0-No pain     Therapy/Group: Individual Therapy  Tiani Stanbery 03/28/2019, 4:38 PM

## 2019-03-28 NOTE — Progress Notes (Signed)
At 2300, no void, bladder scan, I & O cath=700cc's. No void this AM, Bladder scan=157cc's. Poor vision. Poor appetite. A & O to self.Beth Foster A

## 2019-03-28 NOTE — Progress Notes (Addendum)
[facility] PHYSICAL MEDICINE & REHABILITATION PROGRESS NOTE  Subjective/Complaints: Patient seen laying in bed this morning.  She states she slept well overnight.  Confirmed with sleep chart.  She is still slightly confused, but is just waking up, and much improved since yesterday AM.  Discussed p.o. intake and urination with nursing.  P.o. intake remains fairly poor, but patient is voiding more.  ROS: Denies CP, SOB, N/V/D  Objective: Vital Signs: Blood pressure (!) 123/56, pulse 75, temperature 97.7 F (36.5 C), temperature source Oral, resp. rate 18, height 5\' 5"  (1.651 m), weight 64.5 kg, SpO2 92 %. Dg Chest 2 View  Result Date: 03/27/2019 CLINICAL DATA:  Shortness of breath. EXAM: CHEST - 2 VIEW COMPARISON:  03/14/2019 FINDINGS: Lungs are adequately inflated and otherwise clear. Mild increased AP diameter of the chest. Cardiomediastinal silhouette and remainder of the exam is unchanged. IMPRESSION: No active cardiopulmonary disease. Electronically Signed   By: Marin Olp M.D.   On: 03/27/2019 10:07   No results for input(s): WBC, HGB, HCT, PLT in the last 72 hours. No results for input(s): NA, K, CL, CO2, GLUCOSE, BUN, CREATININE, CALCIUM in the last 72 hours.  Physical Exam: BP (!) 123/56 (BP Location: Right Arm)   Pulse 75   Temp 97.7 F (36.5 C) (Oral)   Resp 18   Ht 5\' 5"  (1.651 m)   Wt 64.5 kg   SpO2 92%   BMI 23.66 kg/m   Constitutional: No distress . Vital signs reviewed. HENT: Normocephalic.  Atraumatic. Eyes: EOMI. No discharge. Cardiovascular: No JVD. Respiratory: Normal effort.  No stridor. GI: Non-distended. Skin: Warm and dry.  Intact. Psych: Normal mood.  Normal behavior. Musc: No edema in extremities.  No tenderness in extremities. Neurological:  Alert and oriented x2 Motor:  Right upper extremity: 4/5 proximal to distal, stable Left upper extremities: 4-4+/5 proximal distal, stable Right lower extremity: 4-4+/5 proximal distal, stable Left lower  extremity: 4-4+/5 proximal to distal, stable  Assessment/Plan: 1. Functional deficits secondary to left cerebellar hematoma which require 3+ hours per day of interdisciplinary therapy in a comprehensive inpatient rehab setting.  Physiatrist is providing close team supervision and 24 hour management of active medical problems listed below.  Physiatrist and rehab team continue to assess barriers to discharge/monitor patient progress toward functional and medical goals  Care Tool:  Bathing    Body parts bathed by patient: Right upper leg, Left upper leg, Face, Front perineal area, Abdomen, Chest, Left arm, Right arm, Buttocks   Body parts bathed by helper: Right lower leg, Left lower leg     Bathing assist Assist Level: Minimal Assistance - Patient > 75%     Upper Body Dressing/Undressing Upper body dressing   What is the patient wearing?: Bra, Button up shirt    Upper body assist Assist Level: Moderate Assistance - Patient 50 - 74%    Lower Body Dressing/Undressing Lower body dressing      What is the patient wearing?: Pants, Underwear/pull up     Lower body assist Assist for lower body dressing: Contact Guard/Touching assist     Toileting Toileting    Toileting assist Assist for toileting: Contact Guard/Touching assist     Transfers Chair/bed transfer  Transfers assist     Chair/bed transfer assist level: Minimal Assistance - Patient > 75%     Locomotion Ambulation   Ambulation assist      Assist level: Contact Guard/Touching assist Assistive device: Walker-rolling Max distance: 150   Walk 10 feet activity  Assist     Assist level: Moderate Assistance - Patient - 50 - 74% Assistive device: Walker-rolling   Walk 50 feet activity   Assist Walk 50 feet with 2 turns activity did not occur: Safety/medical concerns(Per report )  Assist level: Contact Guard/Touching assist Assistive device: Walker-rolling    Walk 150 feet activity   Assist  Walk 150 feet activity did not occur: Safety/medical concerns(Per report )    Assistive device: Walker-rolling    Walk 10 feet on uneven surface  activity   Assist Walk 10 feet on uneven surfaces activity did not occur: Safety/medical concerns(Per report)   Assist level: Contact Guard/Touching assist     Wheelchair     Assist   Type of Wheelchair: Manual    Wheelchair assist level: Supervision/Verbal cueing Max wheelchair distance: 30    Wheelchair 50 feet with 2 turns activity    Assist    Wheelchair 50 feet with 2 turns activity did not occur: Safety/medical concerns(fatigue)       Wheelchair 150 feet activity     Assist Wheelchair 150 feet activity did not occur: Safety/medical concerns(fatigue)          Medical Problem List and Plan: 1. Balance deficits with left lean as well as cognitive deficits affecting ADLs and mobility secondary to  left cerebellar hematoma with penetration of 4th ventricle and dependent within occipital horns and lateral ventricles  Continue CIR  Call patient's daughter to update and discuss discharge plans as well as goals of care.  No answer, left voicemail for call back, no return call to date. 2.  Antithrombotics: -DVT/anticoagulation:  Pharmaceutical: Lovenox  CBC ordered for tomorrow             -antiplatelet therapy: N/A 3. HA/back pain/Pain Management: Continue tylenol prn   Lidoderm patch ordered on 11/2 4. Mood: LCSW to follow for evaluation and support.              -antipsychotic agents: N/A 5. Neuropsych: This patient is not fully capable of making decisions on her own behalf. 6. Skin/Wound Care: Routine pressure relief measures.  Maintain adequate nutrition and hydration status. 7. Fluids/Electrolytes/Nutrition:      -Offer nutritional supplements  P.o. intake had been stabilizing/improving, however poor on 11/4-monitor in accordance with treatment for UTI  BMP within acceptable range on 11/2, except for  glucose 8.  HTN: Monitor   Cardizem daily, decreased to 180 on 10/22   Relatively controlled on 11/5  Orthostatics positive on 11/3 9.  Macular degeneration: Visual hallucinations have been worsening.  Exacerbated by bilateral cataracts as well as elevated pressures left eye.    Started Xalatan (new RX per family)  65. Urinary retention:   Flomax started on 10/23, increased on 10/26  Bethanechol started on 10/29, increased again on 11/2  Continue I/O's, discussed with nursing use of coude caths, discussed with patient as well.  Gradually improving on 11/5 11. GERD: Continue Protonix. 12.  Hypoalbuminemia  Supplement initiated on 10/21 13. Constipation, now with incontinence  Bowel meds increased on 10/21, decreased on 11/4  Improving 14.  Post stroke dysphagia  D3 thins, advance as tolerated 15. FUO: Resolved  UA equivocal, Ucx <10k insig growth, wbc's 4.7, afebrile since 10/22  Chest x-ray personally reviewed, unremarkable for infection 16.  Sleep disturbance  Sleep chart  Ambien started 10/23 with some benefit, increased on 10/27, changed to melatonin on 11/5  Improved 17.  Prediabetes  Elevated on 11/2 18.  Leukopenia: Resolved  WBCs  4.2 on 10/28  Continue to monitor 19.  Acute lower UTI-Enterococcus faecalis  Macrobid started on 11/2 20. SOB-appears to be more limited endurance  O2 stats stable per nursing  Chest x-ray personally reviewed, unremarkable for acute process  Improved  LOS: 16 days A FACE TO FACE EVALUATION WAS PERFORMED  Renald Haithcock Lorie Phenix 03/28/2019, 8:39 AM

## 2019-03-29 ENCOUNTER — Inpatient Hospital Stay (HOSPITAL_COMMUNITY): Payer: Medicare Other | Admitting: Physical Therapy

## 2019-03-29 ENCOUNTER — Inpatient Hospital Stay (HOSPITAL_COMMUNITY): Payer: Medicare Other | Admitting: Occupational Therapy

## 2019-03-29 ENCOUNTER — Inpatient Hospital Stay (HOSPITAL_COMMUNITY): Payer: Medicare Other

## 2019-03-29 LAB — CBC WITH DIFFERENTIAL/PLATELET
Abs Immature Granulocytes: 0.01 10*3/uL (ref 0.00–0.07)
Basophils Absolute: 0 10*3/uL (ref 0.0–0.1)
Basophils Relative: 1 %
Eosinophils Absolute: 0.1 10*3/uL (ref 0.0–0.5)
Eosinophils Relative: 4 %
HCT: 39.1 % (ref 36.0–46.0)
Hemoglobin: 12.7 g/dL (ref 12.0–15.0)
Immature Granulocytes: 0 %
Lymphocytes Relative: 39 %
Lymphs Abs: 1.3 10*3/uL (ref 0.7–4.0)
MCH: 32.1 pg (ref 26.0–34.0)
MCHC: 32.5 g/dL (ref 30.0–36.0)
MCV: 98.7 fL (ref 80.0–100.0)
Monocytes Absolute: 0.4 10*3/uL (ref 0.1–1.0)
Monocytes Relative: 12 %
Neutro Abs: 1.4 10*3/uL — ABNORMAL LOW (ref 1.7–7.7)
Neutrophils Relative %: 44 %
Platelets: 209 10*3/uL (ref 150–400)
RBC: 3.96 MIL/uL (ref 3.87–5.11)
RDW: 13.2 % (ref 11.5–15.5)
WBC: 3.3 10*3/uL — ABNORMAL LOW (ref 4.0–10.5)
nRBC: 0 % (ref 0.0–0.2)

## 2019-03-29 MED ORDER — CHLORHEXIDINE GLUCONATE CLOTH 2 % EX PADS
6.0000 | MEDICATED_PAD | Freq: Every day | CUTANEOUS | Status: DC
  Administered 2019-03-29 – 2019-04-03 (×5): 6 via TOPICAL

## 2019-03-29 NOTE — Progress Notes (Signed)
Physical Therapy Session Note  Patient Details  Name: Beth Foster MRN: [id number] Date of Birth: [birth year]  Today's Date: 03/29/2019 PT Individual Time: 1107-1201 PT Individual Time Calculation (min): 54 min   Short Term Goals: Week 3:  PT Short Term Goal 1 (Week 3): = LTGs  Skilled Therapeutic Interventions/Progress Updates:    Pt received sitting in recliner and agreeable to therapy session. Pt oriented to location, situation, and year without cuing but unable to recall month stating that she had just woken up and was still very sleeping - pt continued to report being tired throughout therapy session. Donned sweater with mod assist for proper orientation and cuing for L UE attention. Sit>stand recliner>RW with min assist for lifting and cuing for proper UE placement for safety with AD - reiterated this proper sequencing throughout session for improved carryover. Upon standing pt reports feeling lightheaded - therapist noted pt not wearing TED hose - donned thigh high TEDs in sitting and standing with max assist for threading LEs and CGA for steadying balance in standing with B UE support on RW. Ambulated ~56ft to w/c using RW with CGA/min assist for steadying/balance - max cuing for sequencing of turning fully and AD management prior to sitting. Upon sitting pt reported feeling SOB - SpO2 96% and HR 77bpm - symptom dissipated with time and pt reports she has been experiencing this for a while. Transported to/from gym in w/c. Ambulated ~66ft using RW x2 to/from chair with CGA/min assist for steadying/balance - requires increased assist when turning to sit in the chair - continued to provide mod cuing for sequencing of turning fully prior to initiating sitting to increase pt safety. After 1st walk pt reports feeling lightheaded. Vitals assessed in sitting: BP 129/73 (MAP 90), HR 77bpm then in standing BP 98/60 (MAP 72), HR 88bpm demonstrating orthostatic hypotension. Assessed vitals after 2nd walk: BP  121/59 (MAP 79), HR 77bpm. Stand pivot w/c<>Nustep using RW with CGA/min assist for steadying/balance due to intermittent posterior lean. Performed  B LE reciprocal movement on Nustep targeting LE strengthening and activity tolerance against level 3 resistance for 5.5 minutes targeting steps per minute rate >30 - therapist providing music for increase energy levels and speed of movement.Transported back to room. Ambulated w/c>recliner using RW with CGA/min assist for balance. Pt left seated in recliner with needs in reach, B LEs elevated, and seat belt alarm on.  Therapy Documentation Precautions:  Precautions Precautions: Fall Restrictions Weight Bearing Restrictions: No  Pain:   Pt reports that the TED hose are uncomfortable immediately after donning but does not report any further discomfort throughout session.   Therapy/Group: Individual Therapy  Tawana Scale, PT, DPT 03/29/2019, 8:00 AM

## 2019-03-29 NOTE — Progress Notes (Signed)
Occupational Therapy Weekly Progress Note  Patient Details  Name: Beth Foster MRN: [id number] Date of Birth: 12/10/1928  Beginning of progress report period: March 20, 2019 End of progress report period: March 29, 2019  Today's Date: 03/29/2019 OT Individual Time: 3664-4034 OT Individual Time Calculation (min): 60 min    Patient has met 4 of 4 short term goals.  Pt had not met week 1 goals as of October 28th so continued focusing on them this week. She did meet the goals demonstrating min A for transfers, mobility, dressing.  She will continue to work on these skills to enable her to be consistent with her balance as she continues to have a posterior lean in standing and ambulation inconsistently.  Does well with UE on RW and cues for forward weight shift.  She continues to need mod A with dynamic balance due to posterior lean.  Will focus on her balance this week to reach a goal of min A to enable her to complete self care safely.   Patient continues to demonstrate the following deficits: muscle weakness, decreased cardiorespiratoy endurance, decreased coordination, decreased visual acuity and decreased visual perceptual skills, decreased attention to left, decreased awareness, decreased problem solving, decreased safety awareness, decreased memory and delayed processing, central origin and decreased standing balance, decreased postural control and decreased balance strategies and therefore will continue to benefit from skilled OT intervention to enhance overall performance with BADL.  Patient progressing toward long term goals..  Continue plan of care.  LTGs modified from S to min A on March 25, 2019.  OT Short Term Goals Week 1:  OT Short Term Goal 1 (Week 1): Pt will complete UB dressing with min assist for bra and button up shirt. OT Short Term Goal 1 - Progress (Week 1): Met OT Short Term Goal 2 (Week 1): Pt will complete LB bathing sit to stand with min assist. OT Short Term Goal  2 - Progress (Week 1): Met OT Short Term Goal 3 (Week 1): Pt will complete LB dressing sit to stand with min assist. OT Short Term Goal 3 - Progress (Week 1): Met OT Short Term Goal 4 (Week 1): Pt will ambulate to the toilet with min assist from wheelchari or EOB with RW. OT Short Term Goal 4 - Progress (Week 1): Met Week 2:  OT Short Term Goal 1 (Week 2): 4/4 STGs met. continued week 1 goals into week 2 as she had not met them at the first progress interval. Week 3:  OT Short Term Goal 1 (Week 3): STG = LTGs  Skilled Therapeutic Interventions/Progress Updates:  Pt received in bed ready for therapy.  She knew she was at [facility].    ADL Retraining: Pt did need to toilet but was unable to void. Declined bathing today as she showered yesterday but did want to dress. Sat on regular toilet vs BSC and in regular arm chair vs. W/c and this had her lower to the ground which made it easier to reach her feet.  See notes below, but she was able to don her socks and shoes with A to fix back of shoe and tie them.  Therapeutic Activity/ Exercise: Continues to get short of breath easily with standing tolerance only a minute or so at a time.  Became short of breath when bending forward to don LB clothing over feet.    Transfers: S bed mobilty and CGA to rise to stand and transfer. Improved balance and physical initiation with all  movements.    Balance: CGA to min A with standing balance as she cleansed her bottom, pulled pants over hips and ambulated bed to toilet and toilet to arm chair and then to recliner with RW  Neuromuscular Re-Education: Focused on standing balance and postural control with forward lean using cues of putting weight over the balls of her feet.  Every time pt stood I cued her to rise onto her toes like a ballerina and then relax her heels down to get her grounded before she took steps.  This helped her to find A/P balance and step more efficiently.    Pt c/o dizziness.   Pt  resting in recliner with belt alarm on and all needs met.     Therapy Documentation Precautions:  Precautions Precautions: Fall Restrictions Weight Bearing Restrictions: No       Pain: Pain Assessment Pain Scale: 0-10 Pain Score: 7  Pain Type: Acute pain Pain Location: Head Pain Descriptors / Indicators: Aching Pain Onset: Gradual Pain Intervention(s): RN made aware ADL: ADL Eating: Set up Where Assessed-Eating: Edge of bed Grooming: Setup Where Assessed-Grooming: Sitting at sink Upper Body Bathing: Supervision/safety Where Assessed-Upper Body Bathing: Other (Comment)(toilet) Lower Body Bathing: Contact guard Where Assessed-Lower Body Bathing: Other (Comment)(toilet) Upper Body Dressing: Minimal assistance Where Assessed-Upper Body Dressing: Chair Lower Body Dressing: Minimal assistance Where Assessed-Lower Body Dressing: Chair Toileting: Contact guard Where Assessed-Toileting: Glass blower/designer: Therapist, music Method: Stand pivot, Counselling psychologist: Grab bars   Therapy/Group: Individual Therapy  [place] 03/29/2019, 11:51 AM

## 2019-03-29 NOTE — Progress Notes (Signed)
[place] PHYSICAL MEDICINE & REHABILITATION PROGRESS NOTE  Subjective/Complaints: Patient seen sitting up at bedside this morning. She will be walking with PT to commode this morning. Cathed for 700cc this morning, but she hates cathing. Overnight she had increased hallucinations and visual disturbances as per overnight RN.  As per PT, her endurance continues to be very limited and she has a heavy posterior lean when sitting down.  WBC today 3.3, CBC otherwise stable.   ROS: Denies CP, SOB, N/V/D  Objective: Vital Signs: Blood pressure 139/70, pulse 84, temperature 97.9 F (36.6 C), resp. rate 17, height 5\' 5"  (1.651 m), weight 64.5 kg, SpO2 95 %. No results found. Recent Labs    03/29/19 0541  WBC 3.3*  HGB 12.7  HCT 39.1  PLT 209   No results for input(s): NA, K, CL, CO2, GLUCOSE, BUN, CREATININE, CALCIUM in the last 72 hours.  Physical Exam: BP 139/70 (BP Location: Left Arm)   Pulse 84   Temp 97.9 F (36.6 C)   Resp 17   Ht 5\' 5"  (1.651 m)   Wt 64.5 kg   SpO2 95%   BMI 23.66 kg/m   Constitutional: No distress . Vital signs reviewed. Very pleasant, sitting up at bedside.  HENT: Normocephalic.  Atraumatic. Eyes: EOMI. No discharge. Cardiovascular: No JVD. Respiratory: Normal effort.  No stridor. GI: Non-distended. Skin: Warm and dry.  Intact. Psych: Normal mood.  Normal behavior.  Musc: No edema in extremities.  No tenderness in extremities. Neurological:  Alert and oriented x2 Motor:  Right upper extremity: 4/5 proximal to distal, stable Left upper extremities: 4-4+/5 proximal distal, stable Right lower extremity: 4-4+/5 proximal distal, stable Left lower extremity: 4-4+/5 proximal to distal, stable  Assessment/Plan: 1. Functional deficits secondary to left cerebellar hematoma which require 3+ hours per day of interdisciplinary therapy in a comprehensive inpatient rehab setting.  Physiatrist is providing close team supervision and 24 hour management of  active medical problems listed below.  Physiatrist and rehab team continue to assess barriers to discharge/monitor patient progress toward functional and medical goals  Care Tool:  Bathing    Body parts bathed by patient: Right upper leg, Left upper leg, Face, Front perineal area, Abdomen, Chest, Left arm, Right arm, Buttocks   Body parts bathed by helper: Right lower leg, Left lower leg     Bathing assist Assist Level: Minimal Assistance - Patient > 75%     Upper Body Dressing/Undressing Upper body dressing   What is the patient wearing?: Bra, Pull over shirt, Button up shirt    Upper body assist Assist Level: Minimal Assistance - Patient > 75%    Lower Body Dressing/Undressing Lower body dressing      What is the patient wearing?: Pants, Underwear/pull up     Lower body assist Assist for lower body dressing: Contact Guard/Touching assist     Toileting Toileting    Toileting assist Assist for toileting: Contact Guard/Touching assist     Transfers Chair/bed transfer  Transfers assist     Chair/bed transfer assist level: Contact Guard/Touching assist     Locomotion Ambulation   Ambulation assist      Assist level: Contact Guard/Touching assist Assistive device: Walker-rolling Max distance: 100   Walk 10 feet activity   Assist     Assist level: Contact Guard/Touching assist Assistive device: Walker-rolling   Walk 50 feet activity   Assist Walk 50 feet with 2 turns activity did not occur: Safety/medical concerns(Per report )  Assist level: Contact Guard/Touching  assist Assistive device: Walker-rolling    Walk 150 feet activity   Assist Walk 150 feet activity did not occur: Safety/medical concerns(Per report )    Assistive device: Walker-rolling    Walk 10 feet on uneven surface  activity   Assist Walk 10 feet on uneven surfaces activity did not occur: Safety/medical concerns(Per report)   Assist level: Contact Guard/Touching  assist     Wheelchair     Assist   Type of Wheelchair: Manual    Wheelchair assist level: Supervision/Verbal cueing Max wheelchair distance: 30    Wheelchair 50 feet with 2 turns activity    Assist    Wheelchair 50 feet with 2 turns activity did not occur: Safety/medical concerns(fatigue)       Wheelchair 150 feet activity     Assist Wheelchair 150 feet activity did not occur: Safety/medical concerns(fatigue)          Medical Problem List and Plan: 1. Balance deficits with left lean as well as cognitive deficits affecting ADLs and mobility secondary to  left cerebellar hematoma with penetration of 4th ventricle and dependent within occipital horns and lateral ventricles  Continue CIR  Call patient's daughter to update and discuss discharge plans as well as goals of care.  No answer, left voicemail for call back, no return call to date. 2.  Antithrombotics: -DVT/anticoagulation:  Pharmaceutical: Lovenox  WBC 3.3 on 11/6.              -antiplatelet therapy: N/A 3. HA/back pain/Pain Management: Continue tylenol prn   Lidoderm patch ordered on 11/2 4. Mood: LCSW to follow for evaluation and support.              -antipsychotic agents: N/A 5. Neuropsych: This patient is not fully capable of making decisions on her own behalf. 6. Skin/Wound Care: Routine pressure relief measures.  Maintain adequate nutrition and hydration status. 7. Fluids/Electrolytes/Nutrition:      -Offer nutritional supplements  P.o. intake had been stabilizing/improving, however poor on 11/4-monitor in accordance with treatment for UTI  BMP within acceptable range on 11/2, except for glucose 8.  HTN: Monitor   Cardizem daily, decreased to 180 on 10/22   Relatively controlled on 11/5  Orthostatics positive on 11/3 9.  Macular degeneration: Visual hallucinations have been worsening.  Exacerbated by bilateral cataracts as well as elevated pressures left eye.    Started Xalatan (new RX per  family)  80. Urinary retention:   Flomax started on 10/23, increased on 10/26  Bethanechol started on 10/29, increased again on 11/2  Continue I/O's, discussed with nursing use of coude caths, discussed with patient as well.  Gradually improving on 11/5  Patient hates cathing and continues to have high volumes of urinary retention. At this time, will place foley for patient's comfort.  11. GERD: Continue Protonix. 12.  Hypoalbuminemia  Supplement initiated on 10/21 13. Constipation, now with incontinence  Bowel meds increased on 10/21, decreased on 11/4  Improving 14.  Post stroke dysphagia  D3 thins, advance as tolerated 15. FUO: Resolved  UA equivocal, Ucx <10k insig growth, wbc's 4.7, afebrile since 10/22  Chest x-ray personally reviewed, unremarkable for infection 16.  Sleep disturbance  Sleep chart  Ambien started 10/23 with some benefit, increased on 10/27, changed to melatonin on 11/5  Improved 17.  Prediabetes  Elevated on 11/2 18.  Leukopenia: Resolved  WBCs 4.2 on 10/28  Continue to monitor. 3.3 on 11/6.  19.  Acute lower UTI-Enterococcus faecalis  Macrobid started on 11/2  20. SOB-appears to be more limited endurance  O2 stats stable per nursing  Chest x-ray personally reviewed, unremarkable for acute process  Continue to monitor  LOS: 17 days A FACE TO FACE EVALUATION WAS PERFORMED  Ferrell Flam P Paulkar 03/29/2019, 10:22 AM

## 2019-03-29 NOTE — Progress Notes (Signed)
Physical Therapy Session Note  Patient Details  Name: KEYANI RIGDON MRN: [id number] Date of Birth: 1929/02/24  Today's Date: 03/29/2019 PT Individual Time: 1434-1500 PT Individual Time Calculation (min): 26 min   Short Term Goals: Week 2:  PT Short Term Goal 1 (Week 2): Pt will perform supine<>sit consistently with CGA PT Short Term Goal 1 - Progress (Week 2): Partly met PT Short Term Goal 2 (Week 2): Pt will perform sit<>stands using LRAD with min assist consistently PT Short Term Goal 2 - Progress (Week 2): Met PT Short Term Goal 3 (Week 2): Pt will perform bed<>chair transfers using LRAD with min assist consistently PT Short Term Goal 3 - Progress (Week 2): Met PT Short Term Goal 4 (Week 2): Pt will ambulate 1f using LRAD with min assist PT Short Term Goal 4 - Progress (Week 2): Met Week 3:  PT Short Term Goal 1 (Week 3): = LTGs  Skilled Therapeutic Interventions/Progress Updates:    Pt in recliner stating she wasn't having a good day today. She states she does not feel well. Initially pt declining to get up, but agreeable to attempt toileting and requesting to change in to her nightgown. Performed functional transfers with min assist with RW including gait in/out of bathroom with cues for obstacle negotiation due to visual deficits and safe positioning of RW. PT assisted some with clothing management due to patient reported fatigue to complete independently during toileting and then changing into gown. Returned to bed with mod assist and scooted up in bed. Pt was unable to void on toilet.   Therapy Documentation Precautions:  Precautions Precautions: Fall Restrictions Weight Bearing Restrictions: No Pain: Pain Assessment Pain Score: 0-No pain  Reports being very tired and just not feeling well today since "they catheterized me"   Therapy/Group: Individual Therapy  GCanary BrimBIvory Broad PT, DPT, CBIS  03/29/2019, 3:17 PM

## 2019-03-29 NOTE — Progress Notes (Signed)
Pt continues to show signs of increased confusion and hallucinations seeing "police throwing objects, wheel barrels down the hall, seeing a man walking up and down the hall". Pt voided and had BM on BSC, PVR of 100. Pt in bed resting at this time, call light in reach.

## 2019-03-29 NOTE — Progress Notes (Signed)
Speech Language Pathology Daily Session Note  Patient Details  Name: [name] MRN: [id number] Date of Birth: 1928/06/05  Today's Date: 03/29/2019 SLP Individual Time: 76-1400 SLP Individual Time Calculation (min): 27 min  Short Term Goals: Week 3: SLP Short Term Goal 1 (Week 3): Pt will utilizie memory compensatory strategies to aid in recall of novel, daily information with Min A verbal cues. SLP Short Term Goal 2 (Week 3): Pt will complete functional basic problem solving tasks pertaining to ADLs with Supervision A verbal cues. SLP Short Term Goal 3 (Week 3): Pt will demonstrate self-awareness and self-correction of functional errors in problem solving tasks with min A verbal cues. SLP Short Term Goal 4 (Week 3): Pt will demonstrate selective attention in midly distracting environment for 15 minute intervals with Supervision A verbal cues for redirection.  Skilled Therapeutic Interventions: Skilled ST services focused on cognitive skills. Pt expressed concerns over "where I slept last night" noting hallucinations, SLP provided emotional support and education of orientation. SLP facilitated verbal problem solving skills in verbal sequencing task of ADLs (cleaning dentures and washing face) pt required supervision A verbal cues. Pt demonstrated recall of PT events from today, but not OT events. PT requested to use bathroom, SLP assisted with transfer from recliner to RW to [facility] and to toilet then back in recliner, pt required supervison A verbal cues for problem solving during sequencing and min A physical assistance. Pt requested pain medication of headache, pt demonstrated ability to press soft touch call bell with prompt. ,Pt was left in room with call bell within reach and chair alarm set. SLP notified nurse. ST recommends to continue skilled ST services.      Pain Pain Assessment Pain Score: 0-No pain  Therapy/Group: Individual Therapy  Denny Mccree  [facility] 03/29/2019, 2:36 PM

## 2019-03-30 DIAGNOSIS — I616 Nontraumatic intracerebral hemorrhage, multiple localized: Secondary | ICD-10-CM

## 2019-03-30 NOTE — Progress Notes (Signed)
[place] PHYSICAL MEDICINE & REHABILITATION PROGRESS NOTE  Subjective/Complaints:   ROS: Denies CP, SOB, N/V/D  Objective: Vital Signs: Blood pressure (!) 143/73, pulse 90, temperature 98 F (36.7 C), temperature source Oral, resp. rate 15, height 5\' 5"  (1.651 m), weight 65 kg, SpO2 96 %. No results found. Recent Labs    03/29/19 0541  WBC 3.3*  HGB 12.7  HCT 39.1  PLT 209   No results for input(s): NA, K, CL, CO2, GLUCOSE, BUN, CREATININE, CALCIUM in the last 72 hours.  Physical Exam: BP (!) 143/73 (BP Location: Left Arm)   Pulse 90   Temp 98 F (36.7 C) (Oral)   Resp 15   Ht 5\' 5"  (1.651 m)   Wt 65 kg   SpO2 96%   BMI 23.85 kg/m   Constitutional: No distress . Vital signs reviewed. Very pleasant, sitting up at bedside.  HENT: Normocephalic.  Atraumatic. Eyes: EOMI. No discharge. Cardiovascular: No JVD. Respiratory: Normal effort.  No stridor. GI: Non-distended. Skin: Warm and dry.  Intact. Psych: Normal mood.  Normal behavior.  Musc: No edema in extremities.  No tenderness in extremities. Neurological:  Alert and oriented x2 Motor:  Right upper extremity: 4/5 proximal to distal, stable Left upper extremities: 4-4+/5 proximal distal, stable Right lower extremity: 4-4+/5 proximal distal, stable Left lower extremity: 4-4+/5 proximal to distal, stable  Assessment/Plan: 1. Functional deficits secondary to left cerebellar hematoma which require 3+ hours per day of interdisciplinary therapy in a comprehensive inpatient rehab setting.  Physiatrist is providing close team supervision and 24 hour management of active medical problems listed below.  Physiatrist and rehab team continue to assess barriers to discharge/monitor patient progress toward functional and medical goals  Care Tool:  Bathing    Body parts bathed by patient: Right upper leg, Left upper leg, Face, Front perineal area, Abdomen, Chest, Left arm, Right arm, Buttocks   Body parts bathed by  helper: Right lower leg, Left lower leg     Bathing assist Assist Level: Minimal Assistance - Patient > 75%     Upper Body Dressing/Undressing Upper body dressing   What is the patient wearing?: Bra, Pull over shirt, Button up shirt    Upper body assist Assist Level: Minimal Assistance - Patient > 75%    Lower Body Dressing/Undressing Lower body dressing      What is the patient wearing?: Pants, Underwear/pull up     Lower body assist Assist for lower body dressing: Contact Guard/Touching assist     Toileting Toileting    Toileting assist Assist for toileting: Contact Guard/Touching assist     Transfers Chair/bed transfer  Transfers assist     Chair/bed transfer assist level: Minimal Assistance - Patient > 75%     Locomotion Ambulation   Ambulation assist      Assist level: Minimal Assistance - Patient > 75% Assistive device: Walker-rolling Max distance: 25'   Walk 10 feet activity   Assist     Assist level: Minimal Assistance - Patient > 75% Assistive device: Walker-rolling   Walk 50 feet activity   Assist Walk 50 feet with 2 turns activity did not occur: Safety/medical concerns(Per report )  Assist level: Minimal Assistance - Patient > 75% Assistive device: Walker-rolling    Walk 150 feet activity   Assist Walk 150 feet activity did not occur: Safety/medical concerns(Per report )    Assistive device: Walker-rolling    Walk 10 feet on uneven surface  activity   Assist Walk 10 feet on  uneven surfaces activity did not occur: Safety/medical concerns(Per report)   Assist level: Contact Guard/Touching assist     Wheelchair     Assist   Type of Wheelchair: Manual    Wheelchair assist level: Supervision/Verbal cueing Max wheelchair distance: 30    Wheelchair 50 feet with 2 turns activity    Assist    Wheelchair 50 feet with 2 turns activity did not occur: Safety/medical concerns(fatigue)       Wheelchair 150  feet activity     Assist Wheelchair 150 feet activity did not occur: Safety/medical concerns(fatigue)          Medical Problem List and Plan: 1. Balance deficits with left lean as well as cognitive deficits affecting ADLs and mobility secondary to  left cerebellar hematoma with penetration of 4th ventricle and dependent within occipital horns and lateral ventricles  Continue CIR  Plan is NHP. 2.  Antithrombotics: -DVT/anticoagulation:  Pharmaceutical: Lovenox  WBC 3.3 on 11/6.              -antiplatelet therapy: N/A 3. HA/back pain/Pain Management: Continue tylenol prn   Lidoderm patch ordered on 11/2 4. Mood: LCSW to follow for evaluation and support.              -antipsychotic agents: N/A 5. Neuropsych: This patient is not fully capable of making decisions on her own behalf. 6. Skin/Wound Care: Routine pressure relief measures.  Maintain adequate nutrition and hydration status. 7. Fluids/Electrolytes/Nutrition:      -Offer nutritional supplements  P.o. intake had been stabilizing/improving, however poor on 11/4-monitor in accordance with treatment for UTI  BMP within acceptable range on 11/2, except for glucose 8.  HTN: Monitor   Cardizem daily, decreased to 180 on 10/22   Vitals:   03/29/19 1943 03/30/19 0611  BP: 110/60 (!) 143/73  Pulse: 79 90  Resp: 16 15  Temp: 98.2 F (36.8 C) 98 F (36.7 C)  SpO2: 95% 96%   9.  Macular degeneration: Visual hallucinations have been worsening.  Exacerbated by bilateral cataracts as well as elevated pressures left eye.    Started Xalatan (new RX per family)  83. Urinary retention:   Flomax started on 10/23, increased on 10/26  Bethanechol started on 10/29, increased again on 11/2  Continue I/O's, discussed with nursing use of coude caths, discussed with patient as well.  Gradually improving on 11/5  Patient hates cathing and continues to have high volumes of urinary retention.  foley for patient's comfort. If foley is to  remain, can d/c flomax  11. GERD: Continue Protonix. 12.  Hypoalbuminemia  Supplement initiated on 10/21 13. Constipation, now with incontinence  Bowel meds increased on 10/21, decreased on 11/4  Improving 14.  Post stroke dysphagia  D3 thins, advance as tolerated 15. FUO: Resolved  UA equivocal, Ucx <10k insig growth, wbc's 4.7, afebrile since 10/22  Chest x-ray personally reviewed, unremarkable for infection 16.  Sleep disturbance  Sleep chart  Ambien started 10/23 with some benefit, increased on 10/27, changed to melatonin on 11/5  Improved 17.  Prediabetes  Elevated on 11/2 18.  Leukopenia: Resolved  WBCs 4.2 on 10/28  Continue to monitor. 3.3 on 11/6.  19.  Acute lower UTI-Enterococcus faecalis  Macrobid started on 11/2 20. SOB-appears to be more limited endurance  O2 stats stable per nursing  Chest x-ray personally reviewed, unremarkable for acute process  Continue to monitor  LOS: 18 days A FACE TO FACE EVALUATION WAS PERFORMED  Beth Foster 03/30/2019, 8:18  AM

## 2019-03-30 NOTE — Plan of Care (Signed)
  Problem: Consults Goal: RH STROKE PATIENT EDUCATION Description: See Patient Education module for education specifics  Outcome: Progressing   Problem: RH BOWEL ELIMINATION Goal: RH STG MANAGE BOWEL WITH ASSISTANCE Description: STG Manage Bowel with min Assistance. Outcome: Progressing Goal: RH STG MANAGE BOWEL W/MEDICATION W/ASSISTANCE Description: STG Manage Bowel with Medication with min Assistance. Outcome: Progressing   Problem: RH BLADDER ELIMINATION Goal: RH STG MANAGE BLADDER WITH ASSISTANCE Description: STG Manage Bladder With min Assistance Outcome: Progressing Goal: RH STG MANAGE BLADDER WITH MEDICATION WITH ASSISTANCE Description: STG Manage Bladder With Medication With min Assistance. Outcome: Progressing   Problem: RH SKIN INTEGRITY Goal: RH STG MAINTAIN SKIN INTEGRITY WITH ASSISTANCE Description: STG Maintain Skin Integrity With mod I Assistance. Outcome: Progressing   Problem: RH SAFETY Goal: RH STG DECREASED RISK OF FALL WITH ASSISTANCE Description: STG Decreased Risk of Fall With cues and reminder Assistance. Outcome: Progressing   Problem: RH COGNITION-NURSING Goal: RH STG ANTICIPATES NEEDS/CALLS FOR ASSIST W/ASSIST/CUES Description: STG Anticipates Needs/Calls for Assist With cues and reminders Assistance/Cues. Outcome: Progressing   Problem: RH PAIN MANAGEMENT Goal: RH STG PAIN MANAGED AT OR BELOW PT'S PAIN GOAL Description: Pain level less than 3 on scale of 0-10 Outcome: Progressing   Problem: RH KNOWLEDGE DEFICIT Goal: RH STG INCREASE KNOWLEDGE OF HYPERTENSION Description: Pt will be able to adhere to medication regimen for blood pressure control with min assist from family. Pt will demonstrate safety precaution to take upon discharge to prevent falls with cues and reminders from family. Outcome: Progressing   Problem: RH Vision Goal: RH LTG Vision (Specify) Outcome: Progressing

## 2019-03-31 ENCOUNTER — Inpatient Hospital Stay (HOSPITAL_COMMUNITY): Payer: Medicare Other

## 2019-03-31 NOTE — Progress Notes (Signed)
Physical Therapy Session Note  Patient Details  Name: Beth Foster MRN: [id number] Date of Birth: [birth year]  Today's Date: 03/31/2019 PT Individual Time: 0901-1000 PT Individual Time Calculation (min): 59 min   Short Term Goals: Week 3:  PT Short Term Goal 1 (Week 3): = LTGs  Skilled Therapeutic Interventions/Progress Updates:    Pt supine in bed upon PT arrival, agreeable to therapy tx and denies pain. Pt transferred to sitting EOB with min assist, therapist donned teds/socks total assist for time management. Pt reports having to use the bathroom when asked. Pt ambulated x 10 ft into bathroom with RW and min assist, cues to correct posterior lean. Pt requiring min assist for standing balance while performing pericare and clothing management, continent of bowel. Pt ambulated x 5 ft to the sink with RW and min assist, standing balance with min assist while washing hands at sink and brushing hair. Pt transported to the gym. Pt ambulated 2 x 80 ft this session with RW and CGA, cues for RW management and hand placement during sit<>stands with RW. Pt performed 2 x 5 sit<>stands this session without AD, cues for pushing up with UEs from mat and cues to correct posterior lean in standing, working on balance and LE strength. Pt continues to report dizziness throughout session, encouraged to drink entire cup of water this session. Therapist checked BP in sitting- 117/64, HR 81 bpm, and then in standing- BP 85/62, HR 97 bpm. Pt transported back to room and left in w/c with chair alarm set and needs in reach, provided pt with refill of water as well.   Therapy Documentation Precautions:  Precautions Precautions: Fall Restrictions Weight Bearing Restrictions: No    Therapy/Group: Individual Therapy  Netta Corrigan, PT, DPT, CSRS 03/31/2019, 7:51 AM

## 2019-03-31 NOTE — Progress Notes (Signed)
[facility] PHYSICAL MEDICINE & REHABILITATION PROGRESS NOTE  Subjective/Complaints:  Only ate 1 piece of bacon, magic cup , drinking coffee  ROS: Denies CP, SOB, N/V/D  Objective: Vital Signs: Blood pressure 119/66, pulse 71, temperature 98.1 F (36.7 C), temperature source Oral, resp. rate 15, height 5\' 5"  (1.651 m), weight 61.5 kg, SpO2 95 %. No results found. Recent Labs    03/29/19 0541  WBC 3.3*  HGB 12.7  HCT 39.1  PLT 209   No results for input(s): NA, K, CL, CO2, GLUCOSE, BUN, CREATININE, CALCIUM in the last 72 hours.  Physical Exam: BP 119/66 (BP Location: Left Arm)   Pulse 71   Temp 98.1 F (36.7 C) (Oral)   Resp 15   Ht 5\' 5"  (1.651 m)   Wt 61.5 kg   SpO2 95%   BMI 22.56 kg/m   Constitutional: No distress . Vital signs reviewed. Very pleasant, sitting up at bedside.  HENT: Normocephalic.  Atraumatic. Eyes: EOMI. No discharge. Cardiovascular: No JVD. Respiratory: Normal effort.  No stridor. GI: Non-distended. Skin: Warm and dry.  Intact. Psych: Normal mood.  Normal behavior.  Musc: No edema in extremities.  No tenderness in extremities. Neurological:  Alert and oriented x2 Motor:  Right upper extremity: 4/5 proximal to distal, stable Left upper extremities: 4-4+/5 proximal distal, stable Right lower extremity: 4-4+/5 proximal distal, stable Left lower extremity: 4-4+/5 proximal to distal, stable  Assessment/Plan: 1. Functional deficits secondary to left cerebellar hematoma which require 3+ hours per day of interdisciplinary therapy in a comprehensive inpatient rehab setting.  Physiatrist is providing close team supervision and 24 hour management of active medical problems listed below.  Physiatrist and rehab team continue to assess barriers to discharge/monitor patient progress toward functional and medical goals  Care Tool:  Bathing    Body parts bathed by patient: Right upper leg, Left upper leg, Face, Front perineal area, Abdomen, Chest,  Left arm, Right arm, Buttocks   Body parts bathed by helper: Right lower leg, Left lower leg     Bathing assist Assist Level: Minimal Assistance - Patient > 75%     Upper Body Dressing/Undressing Upper body dressing   What is the patient wearing?: Bra, Pull over shirt, Button up shirt    Upper body assist Assist Level: Minimal Assistance - Patient > 75%    Lower Body Dressing/Undressing Lower body dressing      What is the patient wearing?: Pants, Underwear/pull up     Lower body assist Assist for lower body dressing: Contact Guard/Touching assist     Toileting Toileting    Toileting assist Assist for toileting: Contact Guard/Touching assist     Transfers Chair/bed transfer  Transfers assist     Chair/bed transfer assist level: Minimal Assistance - Patient > 75%     Locomotion Ambulation   Ambulation assist      Assist level: Minimal Assistance - Patient > 75% Assistive device: Walker-rolling Max distance: 25'   Walk 10 feet activity   Assist     Assist level: Minimal Assistance - Patient > 75% Assistive device: Walker-rolling   Walk 50 feet activity   Assist Walk 50 feet with 2 turns activity did not occur: Safety/medical concerns(Per report )  Assist level: Minimal Assistance - Patient > 75% Assistive device: Walker-rolling    Walk 150 feet activity   Assist Walk 150 feet activity did not occur: Safety/medical concerns(Per report )    Assistive device: Walker-rolling    Walk 10 feet on uneven surface  activity   Assist Walk 10 feet on uneven surfaces activity did not occur: Safety/medical concerns(Per report)   Assist level: Contact Guard/Touching assist     Wheelchair     Assist   Type of Wheelchair: Manual    Wheelchair assist level: Supervision/Verbal cueing Max wheelchair distance: 30    Wheelchair 50 feet with 2 turns activity    Assist    Wheelchair 50 feet with 2 turns activity did not occur:  Safety/medical concerns(fatigue)       Wheelchair 150 feet activity     Assist Wheelchair 150 feet activity did not occur: Safety/medical concerns(fatigue)          Medical Problem List and Plan: 1. Balance deficits with left lean as well as cognitive deficits affecting ADLs and mobility secondary to  left cerebellar hematoma with penetration of 4th ventricle and dependent within occipital horns and lateral ventricles  Continue CIR  Plan is NHP. 2.  Antithrombotics: -DVT/anticoagulation:  Pharmaceutical: Lovenox  WBC 3.3 on 11/6.              -antiplatelet therapy: N/A 3. HA/back pain/Pain Management: Continue tylenol prn   Lidoderm patch ordered on 11/2 4. Mood: LCSW to follow for evaluation and support.              -antipsychotic agents: N/A 5. Neuropsych: This patient is not fully capable of making decisions on her own behalf. 6. Skin/Wound Care: Routine pressure relief measures.  Maintain adequate nutrition and hydration status. 7. Fluids/Electrolytes/Nutrition:      -Offer nutritional supplements  P.o. intake had been stabilizing/improving, however poor on 11/4-monitor in accordance with treatment for UTI  BMP within acceptable range on 11/2, except for glucose Limited intake 347ml will reche BUN/Cr in am  8.  HTN: Monitor   Cardizem daily, decreased to 180 on 10/22   Vitals:   03/30/19 1955 03/31/19 0534  BP: (!) 142/71 119/66  Pulse: 87 71  Resp: 15 15  Temp: 98.2 F (36.8 C) 98.1 F (36.7 C)  SpO2: 95% 95%  Controlled 11/8 9.  Macular degeneration: Visual hallucinations have been worsening.  Exacerbated by bilateral cataracts as well as elevated pressures left eye.    Started Xalatan (new RX per family)  31. Urinary retention:   Flomax started on 10/23, increased on 10/26  Bethanechol started on 10/29, increased again on 11/2  Continue I/O's, discussed with nursing use of coude caths, discussed with patient as well.  Gradually improving on  11/5  Patient hates cathing and continues to have high volumes of urinary retention.  foley for patient's comfort. If foley is to remain, can d/c flomax  11. GERD: Continue Protonix. 12.  Hypoalbuminemia  Supplement initiated on 10/21 13. Constipation, now with incontinence  Bowel meds increased on 10/21, decreased on 11/4  Improving 14.  Post stroke dysphagia  D3 thins, advance as tolerated 15. FUO: Resolved  UA equivocal, Ucx <10k insig growth, wbc's 4.7, afebrile since 10/22  Chest x-ray personally reviewed, unremarkable for infection 16.  Sleep disturbance  Sleep chart  Ambien started 10/23 with some benefit, increased on 10/27, changed to melatonin on 11/5  Improved 17.  Prediabetes  Elevated on 11/2 18.  Leukopenia: Resolved  WBCs 4.2 on 10/28  Continue to monitor. 3.3 on 11/6.  19.  Acute lower UTI-Enterococcus faecalis  Macrobid started on 11/2 20. SOB-appears to be more limited endurance  O2 stats stable per nursing  Chest x-ray personally reviewed, unremarkable for acute process  Continue to  monitor  LOS: 19 days A FACE TO FACE EVALUATION WAS PERFORMED  Charlett Blake 03/31/2019, 8:20 AM

## 2019-04-01 ENCOUNTER — Inpatient Hospital Stay (HOSPITAL_COMMUNITY): Payer: Medicare Other

## 2019-04-01 ENCOUNTER — Inpatient Hospital Stay (HOSPITAL_COMMUNITY): Payer: Medicare Other | Admitting: Occupational Therapy

## 2019-04-01 DIAGNOSIS — E876 Hypokalemia: Secondary | ICD-10-CM

## 2019-04-01 LAB — BASIC METABOLIC PANEL
Anion gap: 11 (ref 5–15)
BUN: 12 mg/dL (ref 8–23)
CO2: 25 mmol/L (ref 22–32)
Calcium: 9.1 mg/dL (ref 8.9–10.3)
Chloride: 106 mmol/L (ref 98–111)
Creatinine, Ser: 0.57 mg/dL (ref 0.44–1.00)
GFR calc Af Amer: >60 mL/min (ref >60)
GFR calc non Af Amer: >60 mL/min (ref >60)
Glucose, Bld: 115 mg/dL — ABNORMAL HIGH (ref 70–99)
Potassium: 3.4 mmol/L — ABNORMAL LOW (ref 3.5–5.1)
Sodium: 142 mmol/L (ref 135–145)

## 2019-04-01 MED ORDER — POTASSIUM CHLORIDE CRYS ER 20 MEQ PO TBCR
40.0000 meq | EXTENDED_RELEASE_TABLET | Freq: Once | ORAL | Status: AC
  Administered 2019-04-01: 40 meq via ORAL
  Filled 2019-04-01: qty 2

## 2019-04-01 NOTE — Progress Notes (Signed)
Speech Language Pathology Daily Session Note  Patient Details  Name: NADIA DUNSON MRN: [id number] Date of Birth: 04-03-1929  Today's Date: 04/01/2019 SLP Individual Time: 1003-1059 SLP Individual Time Calculation (min): 56 min  Short Term Goals: Week 3: SLP Short Term Goal 1 (Week 3): Pt will utilizie memory compensatory strategies to aid in recall of novel, daily information with Min A verbal cues. SLP Short Term Goal 2 (Week 3): Pt will complete functional basic problem solving tasks pertaining to ADLs with Supervision A verbal cues. SLP Short Term Goal 3 (Week 3): Pt will demonstrate self-awareness and self-correction of functional errors in problem solving tasks with min A verbal cues. SLP Short Term Goal 4 (Week 3): Pt will demonstrate selective attention in midly distracting environment for 15 minute intervals with Supervision A verbal cues for redirection.  Skilled Therapeutic Interventions: Skilled ST services focused on cognitive skills. Pt was orientated to place, situation and time expect year, however intermittent confusion about place "this is my dad's barber shop", but was easily redirected. SLP facilitated basuc problem solving skills, in verbal sequencing task, safety awareness scenarios and in familiar task naming highest number among to (adjusted due to visual deficits) with supervision A verbal cues. Pt required supervision A verbal cues for selective attention during session. Pt requested to use the bathroom,but was unable to void, pt required supervision verbal cues for problem solving during tranfers and min-supervision physical assistance. Pt demonstrated use of call bell upon request. Pt was left in room with call bell within reach and chair alarm set. ST recommends to continue skilled ST services.      Pain Pain Assessment Pain Score: 0-No pain  Therapy/Group: Individual Therapy  Delphina Schum  [facility] 04/01/2019, 12:20 PM

## 2019-04-01 NOTE — Plan of Care (Signed)
  Problem: Consults Goal: RH STROKE PATIENT EDUCATION Description: See Patient Education module for education specifics  Outcome: Progressing   Problem: RH BOWEL ELIMINATION Goal: RH STG MANAGE BOWEL WITH ASSISTANCE Description: STG Manage Bowel with min Assistance. Outcome: Progressing Goal: RH STG MANAGE BOWEL W/MEDICATION W/ASSISTANCE Description: STG Manage Bowel with Medication with min Assistance. Outcome: Progressing   Problem: RH BLADDER ELIMINATION Goal: RH STG MANAGE BLADDER WITH ASSISTANCE Description: STG Manage Bladder With min Assistance Outcome: Progressing Goal: RH STG MANAGE BLADDER WITH MEDICATION WITH ASSISTANCE Description: STG Manage Bladder With Medication With min Assistance. Outcome: Progressing   Problem: RH SKIN INTEGRITY Goal: RH STG MAINTAIN SKIN INTEGRITY WITH ASSISTANCE Description: STG Maintain Skin Integrity With mod I Assistance. Outcome: Progressing   Problem: RH SAFETY Goal: RH STG DECREASED RISK OF FALL WITH ASSISTANCE Description: STG Decreased Risk of Fall With cues and reminder Assistance. Outcome: Progressing   Problem: RH COGNITION-NURSING Goal: RH STG ANTICIPATES NEEDS/CALLS FOR ASSIST W/ASSIST/CUES Description: STG Anticipates Needs/Calls for Assist With cues and reminders Assistance/Cues. Outcome: Progressing   Problem: RH PAIN MANAGEMENT Goal: RH STG PAIN MANAGED AT OR BELOW PT'S PAIN GOAL Description: Pain level less than 3 on scale of 0-10 Outcome: Progressing   Problem: RH KNOWLEDGE DEFICIT Goal: RH STG INCREASE KNOWLEDGE OF HYPERTENSION Description: Pt will be able to adhere to medication regimen for blood pressure control with min assist from family. Pt will demonstrate safety precaution to take upon discharge to prevent falls with cues and reminders from family. Outcome: Progressing   Problem: RH Vision Goal: RH LTG Vision (Specify) Outcome: Progressing

## 2019-04-01 NOTE — Progress Notes (Signed)
Physical Therapy Session Note  Patient Details  Name: Beth Foster MRN: [id number] Date of Birth: 02-Oct-1928  Today's Date: 04/01/2019 PT Individual Time: [id number] PT Individual Time Calculation (min): 73 min   Short Term Goals: Week 3:  PT Short Term Goal 1 (Week 3): = LTGs  Skilled Therapeutic Interventions/Progress Updates:    Pt seated in w/c upon PT arrival, agreeable to therapy tx and denies pain. Pt finishing lunch when therapist arrives. Pt transported to the gym for energy conservation. Pt performed stand pivot this session from w/c<>nustep with RW and CGA, pt used nustep x 6 minutes on workload 5 for global strength and endurance. Pt ambulated x160 ft this session with RW and CGA, cues for upright posture and increased step length. During session pt did not c/o dizziness, therapist continued to encourage drinking water throughout session during rest breaks. Pt worked on dynamic standing balance with R HHA to perform toe taps on aerobic step x 10 per LE. Pt performed x 10 sit<>stands pushing up from mat working on LE strength and emphasis on minimizing posterior lean in standing without UE support, min assist. Pt ambulated x 120 ft this session with quad cane and min assist (pt used this prior), cues for sequencing and step length, pt requiring external steadying assist to correct occasional lateral and posterior LOB. Pt worked on upright posture and standing balance to perform overhead reaching activity to place clothespins on/off net, x 2 trials with min assist and cues to correct posterior lean. Pt transferred to w/c with CGA and transported back to room. Pt ambulated x 10 ft to the recliner with min assist and quad cane. Pt left in recliner at end of session, needs in reach and chair alarm set.   Therapy Documentation Precautions:  Precautions Precautions: Fall Restrictions Weight Bearing Restrictions: No   Therapy/Group: Individual Therapy  Netta Corrigan, PT, DPT,  CSRS 04/01/2019, 1:06 PM

## 2019-04-01 NOTE — Progress Notes (Signed)
Social Work Patient ID: Beth Foster, female   DOB: Mar 19, 1929, 85 y.o.   MRN: [id number]  Bed offer via [facility] have begun prior auth awaiting insurance CM to contact me regarding where to send records for authorization. Granddaughter aware and will await confirmation.

## 2019-04-01 NOTE — Progress Notes (Signed)
PHYSICAL MEDICINE & REHABILITATION PROGRESS NOTE  Subjective/Complaints: Patient seen laying in bed this morning.  She states she slept well overnight, confirmed with sleep chart.  She states she had a fair weekend.  ROS: Denies CP, SOB, N/V/D  Objective: Vital Signs: Blood pressure (!) 150/70, pulse 84, temperature 98 F (36.7 C), temperature source Oral, resp. rate 16, height 5\' 5"  (1.651 m), weight 66 kg, SpO2 92 %. No results found. No results for input(s): WBC, HGB, HCT, PLT in the last 72 hours. Recent Labs    04/01/19 0615  NA 142  K 3.4*  CL 106  CO2 25  GLUCOSE 115*  BUN 12  CREATININE 0.57  CALCIUM 9.1    Physical Exam: BP (!) 150/70 (BP Location: Left Arm)   Pulse 84   Temp 98 F (36.7 C) (Oral)   Resp 16   Ht 5\' 5"  (1.651 m)   Wt 66 kg   SpO2 92%   BMI 24.21 kg/m   Constitutional: No distress . Vital signs reviewed. HENT: Normocephalic.  Atraumatic. Eyes: EOMI. No discharge. Cardiovascular: No JVD. Respiratory: Normal effort.  No stridor. GI: Non-distended. Skin: Warm and dry.  Intact. Psych: Normal mood.  Normal behavior. Musc: No edema in extremities.  No tenderness in extremities. Neurological:  Alert and oriented x2 (baseline per granddaughter) Motor:  Right upper extremity: 4/5 proximal to distal, stable Left upper extremities: 4-4+/5 proximal distal, unchanged Right lower extremity: 4-4+/5 proximal distal, stable Left lower extremity: 4-4+/5 proximal to distal, unchanged  Assessment/Plan: 1. Functional deficits secondary to left cerebellar hematoma which require 3+ hours per day of interdisciplinary therapy in a comprehensive inpatient rehab setting.  Physiatrist is providing close team supervision and 24 hour management of active medical problems listed below.  Physiatrist and rehab team continue to assess barriers to discharge/monitor patient progress toward functional and medical goals  Care Tool:  Bathing    Body parts  bathed by patient: Right upper leg, Left upper leg, Face, Front perineal area, Abdomen, Chest, Left arm, Right arm, Buttocks   Body parts bathed by helper: Right lower leg, Left lower leg     Bathing assist Assist Level: Minimal Assistance - Patient > 75%     Upper Body Dressing/Undressing Upper body dressing   What is the patient wearing?: Bra, Pull over shirt, Button up shirt    Upper body assist Assist Level: Minimal Assistance - Patient > 75%    Lower Body Dressing/Undressing Lower body dressing      What is the patient wearing?: Pants, Underwear/pull up     Lower body assist Assist for lower body dressing: Contact Guard/Touching assist     Toileting Toileting    Toileting assist Assist for toileting: Contact Guard/Touching assist     Transfers Chair/bed transfer  Transfers assist     Chair/bed transfer assist level: Minimal Assistance - Patient > 75%     Locomotion Ambulation   Ambulation assist      Assist level: Contact Guard/Touching assist Assistive device: Walker-rolling Max distance: 80 ft   Walk 10 feet activity   Assist     Assist level: Contact Guard/Touching assist Assistive device: Walker-rolling   Walk 50 feet activity   Assist Walk 50 feet with 2 turns activity did not occur: Safety/medical concerns(Per report )  Assist level: Contact Guard/Touching assist Assistive device: Walker-rolling    Walk 150 feet activity   Assist Walk 150 feet activity did not occur: Safety/medical concerns(Per report )    Assistive  device: Walker-rolling    Walk 10 feet on uneven surface  activity   Assist Walk 10 feet on uneven surfaces activity did not occur: Safety/medical concerns(Per report)   Assist level: Contact Guard/Touching assist     Wheelchair     Assist   Type of Wheelchair: Manual    Wheelchair assist level: Supervision/Verbal cueing Max wheelchair distance: 30    Wheelchair 50 feet with 2 turns  activity    Assist    Wheelchair 50 feet with 2 turns activity did not occur: Safety/medical concerns(fatigue)       Wheelchair 150 feet activity     Assist Wheelchair 150 feet activity did not occur: Safety/medical concerns(fatigue)          Medical Problem List and Plan: 1. Balance deficits with left lean as well as cognitive deficits affecting ADLs and mobility secondary to  left cerebellar hematoma with penetration of 4th ventricle and dependent within occipital horns and lateral ventricles  Continue CIR, plan for SNF 2.  Antithrombotics: -DVT/anticoagulation:  Pharmaceutical: Lovenox             -antiplatelet therapy: N/A 3. HA/back pain/Pain Management: Continue tylenol prn   Lidoderm patch ordered on 11/2 4. Mood: LCSW to follow for evaluation and support.              -antipsychotic agents: N/A 5. Neuropsych: This patient is not fully capable of making decisions on her own behalf. 6. Skin/Wound Care: Routine pressure relief measures.  Maintain adequate nutrition and hydration status. 7. Fluids/Electrolytes/Nutrition:      -Offer nutritional supplements  P.o. intake improving on 11/9  8.  HTN: Monitor   Cardizem daily, decreased to 180 on 10/22   Vitals:   03/31/19 1937 04/01/19 0547  BP: 135/67 (!) 150/70  Pulse: 86 84  Resp: 15 16  Temp: 98 F (36.7 C) 98 F (36.7 C)  SpO2: 96% 92%   Elevated a.m. on 11/9, otherwise relatively controlled  Orthostatics borderline on 11/9 9.  Macular degeneration: Visual hallucinations have been worsening.  Exacerbated by bilateral cataracts as well as elevated pressures left eye.    Started Xalatan (new RX per family)  43. Urinary retention:   Flomax started on 10/23, increased on 10/26  Bethanechol started on 10/29, increased again on 11/2  Continue I/O's, discussed with nursing use of coude caths, discussed with patient as well.  Foley in place now 89. GERD: Continue Protonix. 12.  Hypoalbuminemia  Supplement  initiated on 10/21 13. Constipation, now with incontinence  Bowel meds increased on 10/21, decreased on 11/4  Improving 14.  Post stroke dysphagia-resolved  Advance to regular diet, thin liquids 15. FUO: Resolved  UA equivocal, Ucx <10k insig growth, wbc's 4.7, afebrile since 10/22  Chest x-ray personally reviewed, unremarkable for infection 16.  Sleep disturbance  Sleep chart  Ambien started 10/23 with some benefit, increased on 10/27, changed to melatonin on 11/5  Improved 17.  Prediabetes  Elevated on 11/9 18.  Leukopenia:   WBCs 3.2 on 11/9  Continue to monitor. 19.  Acute lower UTI-Enterococcus faecalis  Macrobid started on 11/2-11/12 20. SOB-appears to be more limited endurance  O2 stats stable per nursing  Chest x-ray personally reviewed, unremarkable for acute process  Continue to monitor 21.  Hypokalemia  Potassium 3.4 on 11/9  Supplemented x1  LOS: 20 days A FACE TO FACE EVALUATION WAS PERFORMED  Lindy Pennisi Lorie Phenix 04/01/2019, 8:49 AM

## 2019-04-01 NOTE — Progress Notes (Signed)
Occupational Therapy Session Note  Patient Details  Name: Beth Foster MRN: [id number] Date of Birth: 01/27/1929  Today's Date: 04/01/2019 OT Individual Time: 0835-0930 OT Individual Time Calculation (min): 55 min    Short Term Goals: Week 1:  OT Short Term Goal 1 (Week 1): Pt will complete UB dressing with min assist for bra and button up shirt. OT Short Term Goal 1 - Progress (Week 1): Met OT Short Term Goal 2 (Week 1): Pt will complete LB bathing sit to stand with min assist. OT Short Term Goal 2 - Progress (Week 1): Met OT Short Term Goal 3 (Week 1): Pt will complete LB dressing sit to stand with min assist. OT Short Term Goal 3 - Progress (Week 1): Met OT Short Term Goal 4 (Week 1): Pt will ambulate to the toilet with min assist from wheelchari or EOB with RW. OT Short Term Goal 4 - Progress (Week 1): Met Week 2:  OT Short Term Goal 1 (Week 2): 4/4 STGs met. Week 3:  OT Short Term Goal 1 (Week 3): STG = LTGs  Skilled Therapeutic Interventions/Progress Updates:    Pt received sitting EOB drinking her coffee with RN in the room with her.  Pt did want to try to toilet.  Stood from EOB with min A with cues to lean forward and ambulated with min A to toilet with  RW.  Improved step length and control of RW this am, but did c/o dizziness.  Blood pressure in sit and stand WNL.  Pt bathed LB and toileted with min - mod A overall due to posterior lean in standing.  Pt then ambulated to wc at sink to complete UB bathing, oral care.  Donned UB clothing with min A.   Pt then set up in wc with belt alarm to complete her breakfast.  She was able to see the items on her tray well.   Pt was confused this am about where she had been, she believed she had been at a school yesterday and thought she was back at her house.  But when questioned specifically, pt said "oh yes I am in a hospital".  She knew it was fall but believed it was August.    Reoriented pt to month, day of week, etc.    Pt in room  with nurse tech.  Therapy Documentation Precautions:  Precautions Precautions: Fall Restrictions Weight Bearing Restrictions: No    Vital Signs: Therapy Vitals BP: 135/90 Patient Position (if appropriate): Standing Pain: Pain Assessment Pain Scale: 0-10 Pain Score: 0-No pain Faces Pain Scale: No hurt    Therapy/Group: Individual Therapy  SAGUIER,JULIA 04/01/2019, 10:08 AM 

## 2019-04-02 ENCOUNTER — Inpatient Hospital Stay (HOSPITAL_COMMUNITY): Payer: Medicare Other | Admitting: Physical Therapy

## 2019-04-02 ENCOUNTER — Inpatient Hospital Stay (HOSPITAL_COMMUNITY): Payer: Medicare Other | Admitting: Speech Pathology

## 2019-04-02 ENCOUNTER — Inpatient Hospital Stay (HOSPITAL_COMMUNITY): Payer: Medicare Other | Admitting: Occupational Therapy

## 2019-04-02 LAB — SARS CORONAVIRUS 2 (TAT 6-24 HRS): SARS Coronavirus 2: NEGATIVE

## 2019-04-02 NOTE — Progress Notes (Signed)
Occupational Therapy Session Note  Patient Details  Name: Beth Foster MRN: [id number] Date of Birth: 1928/12/08  Today's Date: 04/02/2019 OT Individual Time: 4320-0379 OT Individual Time Calculation (min): 75 min    Short Term Goals: Week 3:  OT Short Term Goal 1 (Week 3): STG = LTGs      Skilled Therapeutic Interventions/Progress Updates:    Pt received in bed asleep; very slow to arouse and consistently reporting "I am too tired, what are you going to do with me?". OT encouraged pt to participate in tx and pt reported she would like some hot coffee after OOB. Pt transfers to EOB with min A for moving legs to EOB and required min VC for hand placement. Pt completed UB bathing sitting EOB, required min A for sitting balance for posterior and L lean. Pt completed LB bathing EOB with (S). Pt completed UB dressing with min A, pt donned button up shirt and required A for buttons d/t low vision. Completed LB dressing with mod A for threading legs into pants and standing balance to pull pants over buttocks. Pt is total A for donning teds and min A for donning socks requiring A to get onto toes. Pt completed functional mobility approx 3' to w/c with RW and GCA. Completed hair grooming task seated at sink with (S). Pt transported to dayroom and to eat breakfast at high low table. Pt able to locate and see items on tray during eating activity. Pt able to recall where she was during session but alternated between thinking she is at home vs a hospital. Mentions that her dad used to cut hair in the room many years ago twice during session. End of session pt left in w/c, safety belt on and all needs met.   Therapy Documentation Precautions:  Precautions Precautions: Fall Restrictions Weight Bearing Restrictions: No      Therapy/Group: Individual Therapy  Nika Yazzie 04/02/2019, 10:18 AM

## 2019-04-02 NOTE — Progress Notes (Signed)
Beth Foster PHYSICAL MEDICINE & REHABILITATION PROGRESS NOTE  Subjective/Complaints: Patient seen laying in bed this morning.  She states she did not sleep well overnight because she had procedures all night.  She is about to work with therapies, however patient states she just wants to sleep.  Nursing notes reviewed-Foley needed to be flushed due to no output.  ROS: Denies CP, SOB, N/V/D  Objective: Vital Signs: Blood pressure 122/69, pulse 72, temperature 98.2 F (36.8 C), resp. rate 18, height 5\' 5"  (1.651 m), weight 65.2 kg, SpO2 91 %. No results found. No results for input(s): WBC, HGB, HCT, PLT in the last 72 hours. Recent Labs    04/01/19 0615  NA 142  K 3.4*  CL 106  CO2 25  GLUCOSE 115*  BUN 12  CREATININE 0.57  CALCIUM 9.1    Physical Exam: BP 122/69 (BP Location: Left Arm)   Pulse 72   Temp 98.2 F (36.8 C)   Resp 18   Ht 5\' 5"  (1.651 m)   Wt 65.2 kg   SpO2 91%   BMI 23.92 kg/m   Constitutional: No distress . Vital signs reviewed. HENT: Normocephalic.  Atraumatic. Eyes: EOMI. No discharge. Cardiovascular: No JVD. Respiratory: Normal effort.  No stridor. GI: Non-distended. Skin: Warm and dry.  Intact. Psych: Normal mood.  Normal behavior. Musc: No edema in extremities.  No tenderness in extremities. Neurological:  Alert and oriented x2 (baseline per granddaughter) Motor:  Right upper extremity: 4-4+/5 proximal to distal Left upper extremities: 4-4+/5 proximal distal, unchanged Right lower extremity: 4-4+/5 proximal distal, stable Left lower extremity: 4-4+/5 proximal to distal, stable  Assessment/Plan: 1. Functional deficits secondary to left cerebellar hematoma which require 3+ hours per day of interdisciplinary therapy in a comprehensive inpatient rehab setting.  Physiatrist is providing close team supervision and 24 hour management of active medical problems listed below.  Physiatrist and rehab team continue to assess barriers to  discharge/monitor patient progress toward functional and medical goals  Care Tool:  Bathing    Body parts bathed by patient: Right upper leg, Left upper leg, Face, Front perineal area, Abdomen, Chest, Left arm, Right arm, Buttocks   Body parts bathed by helper: Right lower leg, Left lower leg     Bathing assist Assist Level: Minimal Assistance - Patient > 75%     Upper Body Dressing/Undressing Upper body dressing   What is the patient wearing?: Bra, Pull over shirt, Button up shirt    Upper body assist Assist Level: Minimal Assistance - Patient > 75%    Lower Body Dressing/Undressing Lower body dressing      What is the patient wearing?: Pants, Underwear/pull up     Lower body assist Assist for lower body dressing: Moderate Assistance - Patient 50 - 74%     Toileting Toileting    Toileting assist Assist for toileting: Minimal Assistance - Patient > 75%     Transfers Chair/bed transfer  Transfers assist     Chair/bed transfer assist level: Contact Guard/Touching assist     Locomotion Ambulation   Ambulation assist      Assist level: Minimal Assistance - Patient > 75% Assistive device: Cane-quad Max distance: 120 ft   Walk 10 feet activity   Assist     Assist level: Minimal Assistance - Patient > 75% Assistive device: Cane-quad   Walk 50 feet activity   Assist Walk 50 feet with 2 turns activity did not occur: Safety/medical concerns(Per report )  Assist level: Minimal Assistance - Patient >  75% Assistive device: Cane-quad    Walk 150 feet activity   Assist Walk 150 feet activity did not occur: Safety/medical concerns(Per report )    Assistive device: Walker-rolling    Walk 10 feet on uneven surface  activity   Assist Walk 10 feet on uneven surfaces activity did not occur: Safety/medical concerns(Per report)   Assist level: Contact Guard/Touching assist     Wheelchair     Assist   Type of Wheelchair: Manual     Wheelchair assist level: Supervision/Verbal cueing Max wheelchair distance: 30    Wheelchair 50 feet with 2 turns activity    Assist    Wheelchair 50 feet with 2 turns activity did not occur: Safety/medical concerns(fatigue)       Wheelchair 150 feet activity     Assist Wheelchair 150 feet activity did not occur: Safety/medical concerns(fatigue)          Medical Problem List and Plan: 1. Balance deficits with left lean as well as cognitive deficits affecting ADLs and mobility secondary to  left cerebellar hematoma with penetration of 4th ventricle and dependent within occipital horns and lateral ventricles  Continue CIR, plan for SNF 2.  Antithrombotics: -DVT/anticoagulation:  Pharmaceutical: Lovenox             -antiplatelet therapy: N/A 3. HA/back pain/Pain Management: Continue tylenol prn   Lidoderm patch ordered on 11/2 4. Mood: LCSW to follow for evaluation and support.              -antipsychotic agents: N/A 5. Neuropsych: This patient is not fully capable of making decisions on her own behalf. 6. Skin/Wound Care: Routine pressure relief measures.  Maintain adequate nutrition and hydration status. 7. Fluids/Electrolytes/Nutrition:      -Offer nutritional supplements  P.o. variable, but overall improving on 11/10, continues to take supplements 8.  HTN: Monitor   Cardizem daily, decreased to 180 on 10/22   Vitals:   04/01/19 1921 04/02/19 0315  BP: 131/67 122/69  Pulse: 85 72  Resp: 18 18  Temp: 97.9 F (36.6 C) 98.2 F (36.8 C)  SpO2: 94% 91%   Controlled on 11/10  Orthostatics negative on 11/10 9.  Macular degeneration: Visual hallucinations have been worsening.  Exacerbated by bilateral cataracts as well as elevated pressures left eye.    Started Xalatan (new RX per family)  68. Urinary retention:   Flomax started on 10/23, increased on 10/26  Bethanechol started on 10/29, increased again on 11/2  Continue I/O's, discussed with nursing use of  coude caths, discussed with patient as well.  Foley in place now 16. GERD: Continue Protonix. 12.  Hypoalbuminemia  Supplement initiated on 10/21 13. Constipation, now with incontinence  Bowel meds increased on 10/21, decreased on 11/4  Improving 14.  Post stroke dysphagia-resolved  Advance to regular diet, thin liquids 15. FUO: Resolved  UA equivocal, Ucx <10k insig growth, wbc's 4.7, afebrile since 10/22  Chest x-ray personally reviewed, unremarkable for infection 16.  Sleep disturbance  Sleep chart  Ambien started 10/23 with some benefit, increased on 10/27, changed to melatonin on 11/5  Improving 17.  Prediabetes  Elevated on 11/9 18.  Leukopenia:   WBCs 3.2 on 11/9  Continue to monitor. 19.  Acute lower UTI-Enterococcus faecalis  Macrobid started on 11/2-11/12 20. SOB-appears to be more limited endurance  O2 stats stable per nursing  Chest x-ray personally reviewed, unremarkable for acute process  Continue to monitor 21.  Hypokalemia  Potassium 3.4 on 11/9, labs ordered for tomorrow  Supplemented x1 on 11/9  LOS: 21 days A FACE TO FACE EVALUATION WAS PERFORMED  Ankit Lorie Phenix 04/02/2019, 8:51 AM

## 2019-04-02 NOTE — Discharge Summary (Addendum)
Physician Discharge Summary  Patient ID: Beth Foster MRN: [id number] DOB/AGE: 07/05/1928 85 y.o.  Admit date: 03/12/2019 Discharge date: 04/04/2019  Discharge Diagnoses:  Principal Problem:   ICH (intracerebral hemorrhage) ([place]) w/ IVH, L cerebellar Active Problems:   Hypoalbuminemia due to protein-calorie malnutrition (HCC)   Urinary retention   Benign essential HTN   Leukopenia   Recurrent UTI   Vaginal candidiasis   Prediabetes   Discharged Condition: stable   Significant Diagnostic Studies: Dg Hip Unilat With Pelvis 2-3 Views Right  Result Date: 04/03/2019 CLINICAL DATA:  85 year old female with right hip pain. No known injury. EXAM: DG HIP (WITH OR WITHOUT PELVIS) 2-3V RIGHT COMPARISON:  None. FINDINGS: There is no acute fracture or dislocation. The bones are osteopenic. Mild bilateral hip osteoarthritic changes. Degenerative changes of the lower lumbar spine. The soft tissues are unremarkable. IMPRESSION: 1. No acute fracture or dislocation. 2. Osteoarthritic changes of the hips. Electronically Signed   By: Anner Crete M.D.   On: 04/03/2019 09:54    Labs:  Basic Metabolic Panel: BMP Latest Ref Rng & Units 04/03/2019 04/01/2019 03/25/2019  Glucose 70 - 99 mg/dL 101(H) 115(H) 126(H)  BUN 8 - 23 mg/dL 14 12 11   Creatinine 0.44 - 1.00 mg/dL 0.82 0.57 0.63  BUN/Creat Ratio 6 - 22 (calc) - - -  Sodium 135 - 145 mmol/L 139 142 139  Potassium 3.5 - 5.1 mmol/L 3.7 3.4(L) 3.6  Chloride 98 - 111 mmol/L 106 106 104  CO2 22 - 32 mmol/L 24 25 25   Calcium 8.9 - 10.3 mg/dL 9.3 9.1 9.1    CBC: CBC Latest Ref Rng & Units 04/03/2019 03/29/2019 03/22/2019  WBC 4.0 - 10.5 K/uL 3.8(L) 3.3(L) 4.2  Hemoglobin 12.0 - 15.0 g/dL 12.2 12.7 12.7  Hematocrit 36.0 - 46.0 % 37.6 39.1 39.7  Platelets 150 - 400 K/uL 170 209 218    CBG: Recent Labs  Lab 04/03/19 1130  GLUCAP 90     Brief HPI:   Beth Foster is a 85 y.o. female with history of HTN, prior CVA with decrease in F MC  RUE, glaucoma, macular degeneration with worsening of vision, Sherran Needs syndrome (visual hallucinations), recent episode of amaurosis fug[place] who was admitted with on 03/08/2019 after found down.  Family reported 2-day history of unsteady gait and right gaze preference.  CT of head done revealing small cerebellar hemorrhage with mild surrounding edema and CTA head neck was negative for LVO.  She was also found to have dysuria and started on antibiotics for Enterococcus UTI.  MRI of brain showed stable left cerebellar hematoma with penetration into fourth ventricle independent with an occipital horns and lateral ventricles as well as extensive small vessel disease noted.   2D echo showed EF of 55 to 60% with mild increase in LVH and trivial pericardial effusion.  She has had reports of low back and pelvic sacral pain and x-rays done were negative for fractures but showed diffuse demineralization.  Her hospital course was complicated by issues with lethargy as well as bouts of confusion and agitation.  Her confusion continued to worsen and she received dose of fosfomycin on 10/20 a.m.  Therapy has been ongoing and patient was noted to be limited by dizziness, decreased endurance, balance deficits with left lean as well as cognitive deficits affecting ADLs and mobility.  CIR was recommended due to functional decline.   Hospital Course: Beth Foster was admitted to rehab 03/12/2019 for inpatient therapies to consist of PT, ST  and OT at least three hours five days a week. Past admission physiatrist, therapy team and rehab RN have worked together to provide customized collaborative inpatient rehab. Due to reports of abdominal distension bladder scanned past admission showing 2200 cc urine in the bladder. She was found to have ongoing issues with urinary retention requiring in and out catheterizations as well as bowel program.  Flomax and bethanechol was started without any improvement and as catheterizations were  extremely painful to Foley was replaced for bladder rest..  She also developed FUO on 10/23 and chest x-ray was negative.  UCS was equivocal and she was treated briefly with Bactrim.  Her blood pressures were monitored on TID basis and she was noted to have dizziness due to orthostatic hypotension.  Cardizem was decreased to 120 mg daily and BP has been controlled on this.  She has also had poor variable input and was treated with IV fluids for hydration briefly.  Nutritional supplements have been offered with and between meals to help with nutritional status. Lytes have been monitored with serial checks and renal status has been stable. She was noted to be severely constipation at admission requiring disimpaction bowel program has been adjusted to help manage symptoms.   Repeat UA done on 10/30 showing Enterococcus UTI and she was started on Macrodantin's x 10 days for treatment.  She failed repeat voiding trial therefore Foley was replaced on 11/6 but she was found to have decrease in UOP on 11/10 due to excessive sediment in foley. This was replaced 11/11 am and required flushing with decreased output.  She was treated with one liter of IVF and urology contacted for input with recommendations to flush foley as needed. She was started on diflucan 11/11 with recommendations to continue  5 total day treatment.  She has been making progress but continue to be limited by significant visual deficits, balance deficits and posterior lean, motor planning deficits as well as issues with activity tolerance. Family has elected on DNR and would like to have further discussions on [place]. She continues to require extensive care and family has elected on SNF for progressive therapy.       Rehab course: During patient's stay in rehab weekly team conferences were held to monitor patient's progress, set goals and discuss barriers to discharge. At admission, patient required max assist with mobility and mod assist with ADL  tasks.  She exhibited severe immediate/short-term memory deficits impacting basic problem-solving, auditory comprehension, selective attention as well as orientation and word finding deficits on high-level tasks.  MoCA blind score was 14 out of 22.She  has had improvement in activity tolerance, balance, postural control as well as ability to compensate for deficits.  She is able to complete ADL tasks with min assist and requires mod assist to don TEDs . She requires CGA with transfers and is able to ambulate 120' with min to mod assist and use of RW. She is able to complete basic tasks with supervision to min verbal cues for problem solving, attention and awareness. She requires mod multimodal cues to recall functional information.     Disposition: Skilled Nursing Facility  Diet: Regular  Special Instructions: 1. Foley care bid--flush with sterile saline as needed. 2. Offer ensure supplements with meals and encourage fluid intake. 3.Please consult Palliative care to discuss [place] with family.    Discharge Instructions    Ambulatory referral to Physical Medicine Rehab   Complete by: As directed    1-2 weeks transitional care appt  Allergies as of 04/04/2019      Reactions   Amoxicillin Anaphylaxis, Other (See Comments)   Has patient had a PCN reaction causing immediate rash, facial/tongue/throat swelling, SOB or lightheadedness with hypotension: Yes Has patient had a PCN reaction causing severe rash involving mucus membranes or skin necrosis: No Has patient had a PCN reaction that required hospitalization No Has patient had a PCN reaction occurring within the last 10 years: No If all of the above answers are "NO", then may proceed with Cephalosporin use.   Angiotensin Receptor Blockers Anaphylaxis, Itching, Rash   Beta Adrenergic Blockers Anaphylaxis, Itching, Rash   Cephalexin Anaphylaxis   Clindamycin/lincomycin Anaphylaxis, Itching, Rash   Hydrocortisone Other (See Comments)    Reaction to cream - causes blisters   Prednisone Other (See Comments)   Causes heart to race   Eggs Or Egg-derived Products Other (See Comments)   Reaction:  Blisters in mouth    Tape Other (See Comments)   Reaction:  Pulls skin off    Amoxicillin Itching, Rash   Did it involve swelling of the face/tongue/throat, SOB, or low BP? No Did it involve sudden or severe rash/hives, skin peeling, or any reaction on the inside of your mouth or nose? Yes Did you need to seek medical attention at a hospital or doctor's office? No When did it last happen?85 years old If all above answers are "NO", may proceed with cephalosporin use.   Clindamycin/lincomycin Itching, Rash   Corticosteroids Itching, Rash   Hydrocortisone Itching, Rash   Keflex [cephalexin] Itching, Rash   Latex Itching, Rash   Latex Rash   Other Itching, Rash, Other (See Comments)   Pt states that she has a pine allergy and she is only able to use fragrance free soaps and laundry products.     Reglan [metoclopramide] Itching, Rash      Medication List    STOP taking these medications   alendronate 70 MG tablet Commonly known as: FOSAMAX   Dilt-XR 240 MG 24 hr capsule Generic drug: diltiazem Replaced by: diltiazem 180 MG 24 hr capsule   heparin 5000 UNIT/ML injection   naproxen 250 MG tablet Commonly known as: NAPROSYN   sodium chloride 0.9 % infusion     TAKE these medications   acetaminophen 325 MG tablet Commonly known as: TYLENOL Take 325 mg by mouth every 6 (six) hours as needed for mild pain or headache. What changed: Another medication with the same name was removed. Continue taking this medication, and follow the directions you see here.   atorvastatin 10 MG tablet Commonly known as: Lipitor Take 1 tablet (10 mg total) by mouth daily.   cholecalciferol 1000 units tablet Commonly known as: VITAMIN D Take 2,000 Units by mouth daily. What changed: Another medication with the same name was removed.  Continue taking this medication, and follow the directions you see here.   diltiazem 180 MG 24 hr capsule Commonly known as: CARDIZEM CD Take 1 capsule (180 mg total) by mouth at bedtime. Replaces: Dilt-XR 240 MG 24 hr capsule   estradiol 0.1 MG/GM vaginal cream Commonly known as: ESTRACE Place 1 Applicatorful vaginally 3 (three) times a week.   EYE VITAMINS PO Take 1 tablet by mouth daily.   feeding supplement (ENSURE ENLIVE) Liqd Take 237 mLs by mouth 3 (three) times daily between meals.   fluconazole 100 MG tablet Commonly known as: DIFLUCAN Take 1 tablet (100 mg total) by mouth daily. Start taking on: April 05, 2019   latanoprost 0.005 %  ophthalmic solution Commonly known as: XALATAN Place 1 drop into both eyes at bedtime.   lidocaine 5 % Commonly known as: LIDODERM Place 1 patch onto the skin daily. Remove & Discard patch within 12 hours or as directed by MD   Melatonin 3 MG Tabs Take 0.5 tablets (1.5 mg total) by mouth at bedtime.   Muscle Rub 10-15 % Crea Apply 1 application topically 4 (four) times daily -  before meals and at bedtime.   pantoprazole 40 MG tablet Commonly known as: PROTONIX Take 1 tablet (40 mg total) by mouth daily. Start taking on: April 05, 2019   polyethylene glycol 17 g packet Commonly known as: MIRALAX / GLYCOLAX Take 17 g by mouth daily. Start taking on: April 05, 2019   senna 8.6 MG Tabs tablet Commonly known as: SENOKOT Take 2 tablets (17.2 mg total) by mouth at bedtime.   sodium phosphate 7-19 GM/118ML Enem Place 133 mLs (1 enema total) rectally daily as needed for severe constipation.   tamsulosin 0.4 MG Caps capsule Commonly known as: FLOMAX Take 2 capsules (0.8 mg total) by mouth daily after supper.   witch hazel-glycerin pad Commonly known as: TUCKS Apply topically as needed for itching.       Contact information for follow-up providers    Jamse Arn, MD Follow up.   Specialty: Physical Medicine  and Rehabilitation Why: Office will call you with follow up appointment Contact information: [address] [place] [address] [place], [place], [place]. Call.   Specialty: Geriatric Medicine Why: for post hospital follow up Contact information: [place]. [place] [address] [phone]        [facility] Follow up.   Contact information: [address]     [place] [phone]           Contact information for after-discharge care    Destination    [facility] Preferred SNF .   Service: Skilled Nursing Contact information: [place] [phone]                  Signed: Bary Leriche 04/04/2019, 10:19 AM  Patient was seen, face-face, and physical exam performed by me on day of discharge, greater than 30 minutes of total time spent.. Please see progress note from today as well.  Delice Lesch, MD, ABPMR

## 2019-04-02 NOTE — Plan of Care (Signed)
  Problem: Consults Goal: RH STROKE PATIENT EDUCATION Description: See Patient Education module for education specifics  Outcome: Progressing   Problem: RH BOWEL ELIMINATION Goal: RH STG MANAGE BOWEL WITH ASSISTANCE Description: STG Manage Bowel with min Assistance. Outcome: Progressing Goal: RH STG MANAGE BOWEL W/MEDICATION W/ASSISTANCE Description: STG Manage Bowel with Medication with min Assistance. Outcome: Progressing   Problem: RH BLADDER ELIMINATION Goal: RH STG MANAGE BLADDER WITH ASSISTANCE Description: STG Manage Bladder With min Assistance Outcome: Progressing Goal: RH STG MANAGE BLADDER WITH MEDICATION WITH ASSISTANCE Description: STG Manage Bladder With Medication With min Assistance. Outcome: Progressing   Problem: RH SKIN INTEGRITY Goal: RH STG MAINTAIN SKIN INTEGRITY WITH ASSISTANCE Description: STG Maintain Skin Integrity With mod I Assistance. Outcome: Progressing   Problem: RH SAFETY Goal: RH STG DECREASED RISK OF FALL WITH ASSISTANCE Description: STG Decreased Risk of Fall With cues and reminder Assistance. Outcome: Progressing   Problem: RH COGNITION-NURSING Goal: RH STG ANTICIPATES NEEDS/CALLS FOR ASSIST W/ASSIST/CUES Description: STG Anticipates Needs/Calls for Assist With cues and reminders Assistance/Cues. Outcome: Progressing   Problem: RH PAIN MANAGEMENT Goal: RH STG PAIN MANAGED AT OR BELOW PT'S PAIN GOAL Description: Pain level less than 3 on scale of 0-10 Outcome: Progressing   Problem: RH KNOWLEDGE DEFICIT Goal: RH STG INCREASE KNOWLEDGE OF HYPERTENSION Description: Pt will be able to adhere to medication regimen for blood pressure control with min assist from family. Pt will demonstrate safety precaution to take upon discharge to prevent falls with cues and reminders from family. Outcome: Progressing   Problem: RH Vision Goal: RH LTG Vision (Specify) Outcome: Progressing

## 2019-04-02 NOTE — Progress Notes (Signed)
Social Work Patient ID: Beth Foster, female   DOB: 08-10-1928, 85 y.o.   MRN: [id number] Awaiting coverage determination, granddaughter aware could be tomorrow and have asked for COVID test-PA ordered one in prepartion for transfer to [facility]. Await coverage approval.

## 2019-04-02 NOTE — Progress Notes (Signed)
NT reported the pts foley had not had any output at 0330. Raquel Sarna NT bladder scanned pt- 662 ml urine in the pts bladder. Collie Siad Agricultural consultant, notified about no urine output, who then flushed and irrigated the foley. Lg amounts of sediment noted when foley was flushed. CHG and Foley care was performed before and after. Pt c/o of severe pain during foley an peri care.

## 2019-04-02 NOTE — Progress Notes (Signed)
Physical Therapy Session Note  Patient Details  Name: Beth Foster MRN: [id number] Date of Birth: [birth year]  Today's Date: 04/02/2019 PT Missed Time: 70 Minutes Missed Time Reason: Patient unwilling to participate  Pt declining participation in therapy 2/2 fatigue and pain. Pt does not elaborate further despite encouragement to do so. Pt also declined assistance w/ getting into more comfortable position or getting into recliner. Missed 30 min of skilled PT.   Sims Laday Clent Demark 04/02/2019, 3:17 PM

## 2019-04-02 NOTE — Progress Notes (Signed)
Social Work Patient ID: Beth Foster, female   DOB: [birth year], 85 y.o.   MRN: [id number]  Contacted [employer] to see where pre-approval stands, still not assigned to a Case manager for review. Will wait to hear from someone due to need to still get COVID test also before transfer can happen.

## 2019-04-02 NOTE — Progress Notes (Signed)
Speech Language Pathology Daily Session Note  Patient Details  Name: Beth Foster MRN: [id number] Date of Birth: 13-Jul-1928  Today's Date: 04/02/2019 SLP Individual Time: [id number] SLP Individual Time Calculation (min): 40 min  Short Term Goals: Week 3: SLP Short Term Goal 1 (Week 3): Pt will utilizie memory compensatory strategies to aid in recall of novel, daily information with Min A verbal cues. SLP Short Term Goal 2 (Week 3): Pt will complete functional basic problem solving tasks pertaining to ADLs with Supervision A verbal cues. SLP Short Term Goal 3 (Week 3): Pt will demonstrate self-awareness and self-correction of functional errors in problem solving tasks with min A verbal cues. SLP Short Term Goal 4 (Week 3): Pt will demonstrate selective attention in midly distracting environment for 15 minute intervals with Supervision A verbal cues for redirection.  Skilled Therapeutic Interventions: Skilled treatment session focused on cognitive goals. Upon arrival, patient was crying due to discomfort and emotionally distressed from recent cathing attempt. With encouragement and repositioning, patient calmed down and was able to participate in treatment session. Patient was oriented to situation but required Max verbal cues to identify familiar family members appropriately in pictures. SLP attempted to have patient make a large calendar but task was unsuccessful due to visual deficits. Patient demonstrated selective attention to tasks for ~10 minutes with Min A verbal cues for redirection. Patient left upright in bed with alarm on and all needs within reach. Continue with current plan of care.      Pain Discomfort from attempted cathing   Therapy/Group: Individual Therapy  Ardean Simonich 04/02/2019, 3:20 PM

## 2019-04-02 NOTE — Progress Notes (Signed)
Physical Therapy Session Note  Patient Details  Name: Beth Foster MRN: [id number] Date of Birth: 1928-10-27  Today's Date: 04/02/2019 PT Missed Time: 45 Minutes Missed Time Reason: Patient fatigue;Patient unwilling to participate  Upon arrival to patient's room she was L sidelying in bed having just awoken from a nap stating "I thought it was about time for you to be waking me up." Patient stated "I don't feel good" then pt's NT arrived and pt stated "is that the man who took my BP earlier" showing patient relatively oriented to situation. Upon further questioning about not feeling well patient stated "I just woke up and I don't feel good" with pt denying headache and unable to clarify symptoms further. RN present and notified. Despite encouragement pt not agreeable to therapy session stating she just wanted to go back to sleep. Pt left L sidelying in bed with needs in reach and bed alarm on.  Tawana Scale, PT, DPT 04/02/2019, 3:56 PM

## 2019-04-02 NOTE — Progress Notes (Signed)
Nutrition Follow-up   RD working remotely.  DOCUMENTATION CODES:   Not applicable  INTERVENTION:  Continue Ensure Enlive po TID, each supplement provides 350 kcal and 20 grams of protein.  Continue Magic cup TID with meals, each supplement provides 290 kcal and 9 grams of protein.  Continue 30 ml Prostat po BID, each supplement provides 100 kcal and 15 grams of protein.   Encourage adequate PO intake.   NUTRITION DIAGNOSIS:   Increased nutrient needs related to wound healing as evidenced by estimated needs; ongoing  GOAL:   Patient will meet greater than or equal to 90% of their needs; progressing  MONITOR:   PO intake, Supplement acceptance, Weight trends, Skin  REASON FOR ASSESSMENT:   Consult Poor PO  ASSESSMENT:   85 year old female with PMH of HTN, prior stroke with decreased [place] RUE, glaucoma, macular degeneration with worsening of vision and recent reports of amaurosis fugax. Pt was admitted on 03/08/19 after found down and CT head done revealing small cerebellar peduncle hemorrhage with mild surrounding edema. CTA head/neck showed tortuosity and dilatation of supraclinoid ICA and no large vessel occlusion. MRI brain showed stable left cerebellar hematoma with penetration of 4th ventricle and dependent within occipital horns and lateral ventricles. Extensive chronic small vessel disease noted. Pt admitted to CIR on 10/20.  Meal completion has been varied from 15-100% with most recent intake of 50-100%. Pt currently has Ensure and Prostat ordered and has been consuming them. RD to continue with current orders to aid in caloric and protein needs as well as in healing. Labs and medications reviewed.   Diet Order:   Diet Order            Diet regular Room service appropriate? Yes; Fluid consistency: Thin  Diet effective now              EDUCATION NEEDS:   Education needs have been addressed  Skin:  Skin Assessment: Skin Integrity Issues: Skin Integrity  Issues:: Stage I DTI: sacrum, head Stage I: R buttocks  Last BM:  11/8  Height:   Ht Readings from Last 1 Encounters:  04/02/19 5\' 5"  (1.651 m)    Weight:   Wt Readings from Last 1 Encounters:  04/02/19 65.2 kg    Ideal Body Weight:  56.8 kg  BMI:  Body mass index is 23.92 kg/m.  Estimated Nutritional Needs:   Kcal:  1450-1650  Protein:  70-85 grams  Fluid:  1.4-1.6 L    Corrin Parker, MS, RD, LDN Pager # [phone] After hours/ weekend pager # [phone]

## 2019-04-02 NOTE — Progress Notes (Signed)
Occupational Therapy Session Note  Patient Details  Name: Beth Foster MRN: [id number] Date of Birth: March 16, 1929  Today's Date: 04/02/2019 OT Individual Time: 1050-1200 OT Individual Time Calculation (min): 70 min    Short Term Goals: Week 1:  OT Short Term Goal 1 (Week 1): Pt will complete UB dressing with min assist for bra and button up shirt. OT Short Term Goal 1 - Progress (Week 1): Met OT Short Term Goal 2 (Week 1): Pt will complete LB bathing sit to stand with min assist. OT Short Term Goal 2 - Progress (Week 1): Met OT Short Term Goal 3 (Week 1): Pt will complete LB dressing sit to stand with min assist. OT Short Term Goal 3 - Progress (Week 1): Met OT Short Term Goal 4 (Week 1): Pt will ambulate to the toilet with min assist from wheelchari or EOB with RW. OT Short Term Goal 4 - Progress (Week 1): Met Week 2:  OT Short Term Goal 1 (Week 2): 4/4 STGs met. Week 3:  OT Short Term Goal 1 (Week 3): STG = LTGs  Skilled Therapeutic Interventions/Progress Updates:    Pt received in bed, sleeping soundly.  It took her a few minutes to wake up. Had pt do a few bed level exercises of bridges and knee drops with mod cues and guiding A.  She was agreeable to try to use the bathroom.  Pt sat to EOB with min A and sat for a few minutes as therapist donned shoes for her.  She continues to need cues and guidance with foot placement as she keeps her feet together and forward.  Pt stood and used RW to ambulate to toilet with CGA.  Did not have bowel movement but did self cleanse and managed clothing with min A for balance.   She continues to have a posterior lean.  Returned to chair and pt out of breath. Pt rested. Obtained B shoe heel lift inserts and placed under her insoles to give her tactile feed back to place more weight onto balls of her feet.  With cues for feet placement, pt was able to stand up to RW with pushing up with B hands with S 2x.  She ambulated 25 feet with light CGA 2x, resting  4-5 min in between walks.   Pt knows she is in the hospital but talking about her fathers barber shop that was in this location. "His chair was right next to that dresser".    Pt set up in w/c with belt alarm on and set up with tray table to prepare for lunch.  Therapy Documentation Precautions:  Precautions Precautions: Fall Restrictions Weight Bearing Restrictions: No   Pain: Pain Assessment Pain Scale: 0-10 Pain Score: 0-No pain ADL: ADL Eating: Set up Where Assessed-Eating: Edge of bed Grooming: Setup Where Assessed-Grooming: Sitting at sink Upper Body Bathing: Supervision/safety Where Assessed-Upper Body Bathing: Other (Comment)(toilet) Lower Body Bathing: Contact guard Where Assessed-Lower Body Bathing: Other (Comment)(toilet) Upper Body Dressing: Minimal assistance Where Assessed-Upper Body Dressing: Chair Lower Body Dressing: Minimal assistance Where Assessed-Lower Body Dressing: Chair Toileting: Contact guard Where Assessed-Toileting: Glass blower/designer: Therapist, music Method: Stand pivot, Counselling psychologist: Grab bars   Therapy/Group: Individual Therapy  [place] 04/02/2019, 12:19 PM

## 2019-04-03 ENCOUNTER — Inpatient Hospital Stay (HOSPITAL_COMMUNITY): Payer: Medicare Other

## 2019-04-03 ENCOUNTER — Inpatient Hospital Stay (HOSPITAL_COMMUNITY): Payer: Medicare Other | Admitting: Speech Pathology

## 2019-04-03 ENCOUNTER — Inpatient Hospital Stay (HOSPITAL_COMMUNITY): Payer: Medicare Other | Admitting: Occupational Therapy

## 2019-04-03 DIAGNOSIS — B373 Candidiasis of vulva and vagina: Secondary | ICD-10-CM

## 2019-04-03 DIAGNOSIS — R7303 Prediabetes: Secondary | ICD-10-CM

## 2019-04-03 DIAGNOSIS — B3731 Acute candidiasis of vulva and vagina: Secondary | ICD-10-CM

## 2019-04-03 LAB — BASIC METABOLIC PANEL
Anion gap: 9 (ref 5–15)
BUN: 14 mg/dL (ref 8–23)
CO2: 24 mmol/L (ref 22–32)
Calcium: 9.3 mg/dL (ref 8.9–10.3)
Chloride: 106 mmol/L (ref 98–111)
Creatinine, Ser: 0.82 mg/dL (ref 0.44–1.00)
GFR calc Af Amer: >60 mL/min (ref >60)
GFR calc non Af Amer: >60 mL/min (ref >60)
Glucose, Bld: 101 mg/dL — ABNORMAL HIGH (ref 70–99)
Potassium: 3.7 mmol/L (ref 3.5–5.1)
Sodium: 139 mmol/L (ref 135–145)

## 2019-04-03 LAB — CBC WITH DIFFERENTIAL/PLATELET
Abs Immature Granulocytes: 0.01 10*3/uL (ref 0.00–0.07)
Basophils Absolute: 0 10*3/uL (ref 0.0–0.1)
Basophils Relative: 1 %
Eosinophils Absolute: 0.2 10*3/uL (ref 0.0–0.5)
Eosinophils Relative: 5 %
HCT: 37.6 % (ref 36.0–46.0)
Hemoglobin: 12.2 g/dL (ref 12.0–15.0)
Immature Granulocytes: 0 %
Lymphocytes Relative: 34 %
Lymphs Abs: 1.3 10*3/uL (ref 0.7–4.0)
MCH: 32.5 pg (ref 26.0–34.0)
MCHC: 32.4 g/dL (ref 30.0–36.0)
MCV: 100.3 fL — ABNORMAL HIGH (ref 80.0–100.0)
Monocytes Absolute: 0.5 10*3/uL (ref 0.1–1.0)
Monocytes Relative: 12 %
Neutro Abs: 1.8 10*3/uL (ref 1.7–7.7)
Neutrophils Relative %: 48 %
Platelets: 170 10*3/uL (ref 150–400)
RBC: 3.75 MIL/uL — ABNORMAL LOW (ref 3.87–5.11)
RDW: 13.4 % (ref 11.5–15.5)
WBC: 3.8 10*3/uL — ABNORMAL LOW (ref 4.0–10.5)
nRBC: 0 % (ref 0.0–0.2)

## 2019-04-03 LAB — GLUCOSE, CAPILLARY: Glucose-Capillary: 90 mg/dL (ref 70–99)

## 2019-04-03 MED ORDER — FLUCONAZOLE 150 MG PO TABS
150.0000 mg | ORAL_TABLET | Freq: Once | ORAL | Status: AC
  Administered 2019-04-03: 150 mg via ORAL
  Filled 2019-04-03: qty 1

## 2019-04-03 MED ORDER — FLUCONAZOLE 100 MG PO TABS
100.0000 mg | ORAL_TABLET | Freq: Every day | ORAL | Status: DC
  Administered 2019-04-04: 100 mg via ORAL
  Filled 2019-04-03: qty 1

## 2019-04-03 MED ORDER — SODIUM CHLORIDE 0.9 % IV SOLN
INTRAVENOUS | Status: DC
  Administered 2019-04-03 – 2019-04-04 (×4): via INTRAVENOUS

## 2019-04-03 MED ORDER — POLYETHYLENE GLYCOL 3350 17 G PO PACK
17.0000 g | PACK | Freq: Every day | ORAL | Status: DC
  Administered 2019-04-03 – 2019-04-04 (×2): 17 g via ORAL
  Filled 2019-04-03 (×2): qty 1

## 2019-04-03 NOTE — Progress Notes (Signed)
Pt was crying during insertion of foley. Lidocaine was used. Pts Labia was inflamed and very tender to touch. Bladder scan showed 37 ml. Pt was not able to tolerate peri and foley care. Urine flow was present after placement.    1 hour after placement, pts foley bag had >100 ml of urine. Foley was irrigated with 50 mls of sterile water. Continuing to monitor for output.

## 2019-04-03 NOTE — Progress Notes (Signed)
Occupational Therapy Discharge Summary  Patient Details  Name: Beth Foster MRN: [id number] Date of Birth: 1929-01-18     Patient has met 11 of 12 long term goals due to improved activity tolerance, improved balance, postural control, ability to compensate for deficits and improved awareness.  Patient to discharge at [facility] Assist level.  Patient's care partner unavailable to provide the necessary physical and cognitive assistance at discharge.  Pt will be going to a SNF for continued rehab.  Reasons goals not met: Pt had LTG of LB dressing with min A.  She has been able to do that some days but on average she requires mod A to manage TED hose and shoes.   Recommendation:  Patient will benefit from ongoing skilled OT services in skilled nursing facility setting to continue to advance functional skills in the area of BADL.  Equipment: No equipment provided  Reasons for discharge: treatment goals met  Patient/family agrees with progress made and goals achieved: Yes  OT Discharge Precautions/Restrictions  Precautions Precautions: Fall;Other (comment) Precaution Comments: dizziness, low blood pressure Restrictions Weight Bearing Restrictions: No  ADL ADL Eating: Set up Where Assessed-Eating: Edge of bed Grooming: Setup Where Assessed-Grooming: Sitting at sink Upper Body Bathing: Supervision/safety Where Assessed-Upper Body Bathing: Other (Comment)(toilet) Lower Body Bathing: Contact guard Where Assessed-Lower Body Bathing: Other (Comment)(toilet) Upper Body Dressing: Minimal assistance Where Assessed-Upper Body Dressing: Chair Lower Body Dressing: Minimal assistance Where Assessed-Lower Body Dressing: Chair Toileting: Contact guard Where Assessed-Toileting: Glass blower/designer: Therapist, music Method: Stand pivot, Counselling psychologist: Grab bars Vision Baseline Vision/History: Wears glasses;Macular Degeneration Wears Glasses: At all  times Patient Visual Report: No change from baseline Vision Assessment?: Yes Additional Comments: intermittent ability to see, pt can see stains on her shirt or if a small item is on the floor but has difficulty location items on her tray;  resting nystagmus Perception  Perception: Impaired Inattention/Neglect: Does not attend to left visual field(with ambulation, she tends to run RW into doorways on her L and has difficulty keeping her body in middle of RW during ambulation) Praxis Praxis: Intact Cognition Overall Cognitive Status: Impaired/Different from baseline Orientation Level: Oriented to person;Oriented to situation;Oriented to place Memory: Impaired Memory Impairment: Decreased recall of new information;Storage deficit;Retrieval deficit Awareness: Impaired Awareness Impairment: Emergent impairment Sensation Sensation Light Touch: Appears Intact Hot/Cold: Appears Intact Proprioception: Appears Intact Stereognosis: Appears Intact Coordination Gross Motor Movements are Fluid and Coordinated: Yes Fine Motor Movements are Fluid and Coordinated: Yes Heel Shin Test: = bil; WNLS; faster rate than admission Motor  Motor Motor: Abnormal postural alignment and control Mobility  Bed Mobility Bed Mobility: Supine to Sit Rolling Right: Supervision/verbal cueing Rolling Left: Supervision/Verbal cueing Supine to Sit: Contact Guard/Touching assist Transfers Sit to Stand: Moderate Assistance - Patient 50-74%;Minimal Assistance - Patient > 75% Stand to Sit: Contact Guard/Touching assist  Trunk/Postural Assessment  Postural Control Trunk Control: posterior lean in sit to stand and standing Righting Reactions: delayed trunk righting Protective Responses: delayed hip strategy and inadequate ankle strategy, improved from admission with using UE for support Postural Limitations: scoliosis; LLE shorter than RLE, chronic  Balance Static Sitting Balance Static Sitting - Level of  Assistance: 5: Stand by assistance Dynamic Sitting Balance Dynamic Sitting - Level of Assistance: 5: Stand by assistance Static Standing Balance Static Standing - Level of Assistance: 4: Min assist Dynamic Standing Balance Dynamic Standing - Level of Assistance: 4: Min assist Extremity/Trunk Assessment RUE Assessment RUE Assessment: Within Functional Limits LUE Assessment  LUE Assessment: Within Functional Limits   Kyrianna Barletta 04/03/2019, 11:56 AM

## 2019-04-03 NOTE — Progress Notes (Addendum)
[facility] PHYSICAL MEDICINE & REHABILITATION PROGRESS NOTE  Subjective/Complaints: Patient morning working with therapies.  She states she slept fairly well overnight.  Discussed overnight bladder issues with PA, social worker, therapies.  ROS: Denies CP, SOB, N/V/D  Objective: Vital Signs: Blood pressure (!) 134/55, pulse 72, temperature 97.9 F (36.6 C), resp. rate 17, height 5\' 5"  (1.651 m), weight 61.2 kg, SpO2 96 %. No results found. Recent Labs    04/03/19 0447  WBC 3.8*  HGB 12.2  HCT 37.6  PLT 170   Recent Labs    04/01/19 0615 04/03/19 0447  NA 142 139  K 3.4* 3.7  CL 106 106  CO2 25 24  GLUCOSE 115* 101*  BUN 12 14  CREATININE 0.57 0.82  CALCIUM 9.1 9.3    Physical Exam: BP (!) 134/55 (BP Location: Right Arm)   Pulse 72   Temp 97.9 F (36.6 C)   Resp 17   Ht 5\' 5"  (1.651 m)   Wt 61.2 kg   SpO2 96%   BMI 22.45 kg/m   Constitutional: No distress . Vital signs reviewed. HENT: Normocephalic.  Atraumatic. Eyes: EOMI. No discharge. Cardiovascular: No JVD. Respiratory: Normal effort.  No stridor. GI: Non-distended. Skin: Warm and dry.  Intact. Psych: Normal mood.  Normal behavior. Musc: No edema in extremities.  No tenderness in extremities. Neurological:  Alert and oriented x2 (baseline per granddaughter) Motor:  Right upper extremity: 4-4+/5 proximal to distal, unchanged Left upper extremities: 4-4+/5 proximal distal, unchanged Right lower extremity: 4-4+/5 proximal distal, stable (some pain inhibition) Left lower extremity: 4-4+/5 proximal to distal, stable  Assessment/Plan: 1. Functional deficits secondary to left cerebellar hematoma which require 3+ hours per day of interdisciplinary therapy in a comprehensive inpatient rehab setting.  Physiatrist is providing close team supervision and 24 hour management of active medical problems listed below.  Physiatrist and rehab team continue to assess barriers to discharge/monitor patient progress  toward functional and medical goals  Care Tool:  Bathing    Body parts bathed by patient: Chest, Abdomen, Left arm, Right arm, Face, Right lower leg, Left lower leg, Left upper leg, Right upper leg   Body parts bathed by helper: Right lower leg, Left lower leg     Bathing assist Assist Level: Minimal Assistance - Patient > 75%     Upper Body Dressing/Undressing Upper body dressing   What is the patient wearing?: Button up shirt    Upper body assist Assist Level: Minimal Assistance - Patient > 75%    Lower Body Dressing/Undressing Lower body dressing      What is the patient wearing?: Pants     Lower body assist Assist for lower body dressing: Moderate Assistance - Patient 50 - 74%     Toileting Toileting    Toileting assist Assist for toileting: Minimal Assistance - Patient > 75%     Transfers Chair/bed transfer  Transfers assist     Chair/bed transfer assist level: Contact Guard/Touching assist     Locomotion Ambulation   Ambulation assist      Assist level: Minimal Assistance - Patient > 75% Assistive device: Cane-quad Max distance: 120 ft   Walk 10 feet activity   Assist     Assist level: Minimal Assistance - Patient > 75% Assistive device: Cane-quad   Walk 50 feet activity   Assist Walk 50 feet with 2 turns activity did not occur: Safety/medical concerns(Per report )  Assist level: Minimal Assistance - Patient > 75% Assistive device: Cane-quad  Walk 150 feet activity   Assist Walk 150 feet activity did not occur: Safety/medical concerns(Per report )    Assistive device: Walker-rolling    Walk 10 feet on uneven surface  activity   Assist Walk 10 feet on uneven surfaces activity did not occur: Safety/medical concerns(Per report)   Assist level: Contact Guard/Touching assist     Wheelchair     Assist   Type of Wheelchair: Manual    Wheelchair assist level: Supervision/Verbal cueing Max wheelchair distance: 30     Wheelchair 50 feet with 2 turns activity    Assist    Wheelchair 50 feet with 2 turns activity did not occur: Safety/medical concerns(fatigue)       Wheelchair 150 feet activity     Assist Wheelchair 150 feet activity did not occur: Safety/medical concerns(fatigue)          Medical Problem List and Plan: 1. Balance deficits with left lean as well as cognitive deficits affecting ADLs and mobility secondary to  left cerebellar hematoma with penetration of 4th ventricle and dependent within occipital horns and lateral ventricles  Cont CIR, plan for SNF, likely tomorrow  Team conference today to discuss current and goals and coordination of care, home and environmental barriers, and discharge planning with nursing, case manager, and therapies.  2.  Antithrombotics: -DVT/anticoagulation:  Pharmaceutical: Lovenox             -antiplatelet therapy: N/A 3. HA/back pain/Pain Management: Continue tylenol prn   Lidoderm patch ordered on 11/2 4. Mood: LCSW to follow for evaluation and support.              -antipsychotic agents: N/A 5. Neuropsych: This patient is not fully capable of making decisions on her own behalf. 6. Skin/Wound Care: Routine pressure relief measures.  Maintain adequate nutrition and hydration status. 7. Fluids/Electrolytes/Nutrition:      -Offer nutritional supplements  P.o. variable, but overall improving on 11/10, continues to take supplements 8.  HTN: Monitor   Cardizem daily, decreased to 180 on 10/22   Vitals:   04/02/19 2018 04/03/19 0534  BP: (!) 125/50 (!) 134/55  Pulse: 74 72  Resp: 15 17  Temp: 98.1 F (36.7 C) 97.9 F (36.6 C)  SpO2: 95% 96%   Controlled on 11/11  Orthostatics negative on 11/10 9.  Macular degeneration: Visual hallucinations have been worsening.  Exacerbated by bilateral cataracts as well as elevated pressures left eye.    Started Xalatan (new RX per family)  84. Urinary retention:   Flomax started on 10/23,  increased on 10/26  Bethanechol started on 10/29, increased again on 11/2  Continue I/O's, discussed with nursing use of coude caths, discussed with patient as well.  Foley in place now  Discussed with urology regarding the inconsistent bladder scans.  Recommended cath flushes as needed and replacing the Foley as needed 11. GERD: Continue Protonix. 12.  Hypoalbuminemia  Supplement initiated on 10/21 13. Constipation  Bowel meds increased on 10/21, decreased on 11/4, increased on 11/11  Improving 14.  Post stroke dysphagia-resolved  Advance to regular diet, thin liquids 15. FUO: Resolved  UA equivocal, Ucx <10k insig growth, wbc's 4.7, afebrile since 10/22  Chest x-ray personally reviewed, unremarkable for infection 16.  Sleep disturbance  Sleep chart  Ambien started 10/23 with some benefit, increased on 10/27, changed to melatonin on 11/5  Improving 17.  Prediabetes  Mildly elevated on 11/11 18.  Leukopenia:   WBCs 3.8 on 11/11  Continue to monitor. 19.  Acute  lower UTI-Enterococcus faecalis  Macrobid started on 11/2-11/12 20. SOB-appears to be more limited endurance  O2 stats stable per nursing  Chest x-ray personally reviewed, unremarkable for acute process  Continue to monitor 21.  Hypokalemia  Potassium 3.7 on 11/11  Supplemented x1 on 11/9 22. Vaginal candidiasis  Treated with Diflucan on 11/10  LOS: 22 days A FACE TO FACE EVALUATION WAS PERFORMED  Shiquan Mathieu Lorie Phenix 04/03/2019, 9:42 AM

## 2019-04-03 NOTE — Plan of Care (Signed)
  Problem: Consults Goal: RH STROKE PATIENT EDUCATION Description: See Patient Education module for education specifics  Outcome: Progressing   Problem: RH BOWEL ELIMINATION Goal: RH STG MANAGE BOWEL WITH ASSISTANCE Description: STG Manage Bowel with min Assistance. Outcome: Progressing Goal: RH STG MANAGE BOWEL W/MEDICATION W/ASSISTANCE Description: STG Manage Bowel with Medication with min Assistance. Outcome: Progressing   Problem: RH BLADDER ELIMINATION Goal: RH STG MANAGE BLADDER WITH ASSISTANCE Description: STG Manage Bladder With min Assistance Outcome: Progressing Goal: RH STG MANAGE BLADDER WITH MEDICATION WITH ASSISTANCE Description: STG Manage Bladder With Medication With min Assistance. Outcome: Progressing   Problem: RH SKIN INTEGRITY Goal: RH STG MAINTAIN SKIN INTEGRITY WITH ASSISTANCE Description: STG Maintain Skin Integrity With mod I Assistance. Outcome: Progressing   Problem: RH SAFETY Goal: RH STG DECREASED RISK OF FALL WITH ASSISTANCE Description: STG Decreased Risk of Fall With cues and reminder Assistance. Outcome: Progressing   Problem: RH COGNITION-NURSING Goal: RH STG ANTICIPATES NEEDS/CALLS FOR ASSIST W/ASSIST/CUES Description: STG Anticipates Needs/Calls for Assist With cues and reminders Assistance/Cues. Outcome: Progressing   Problem: RH PAIN MANAGEMENT Goal: RH STG PAIN MANAGED AT OR BELOW PT'S PAIN GOAL Description: Pain level less than 3 on scale of 0-10 Outcome: Progressing   Problem: RH KNOWLEDGE DEFICIT Goal: RH STG INCREASE KNOWLEDGE OF HYPERTENSION Description: Pt will be able to adhere to medication regimen for blood pressure control with min assist from family. Pt will demonstrate safety precaution to take upon discharge to prevent falls with cues and reminders from family. Outcome: Progressing   Problem: RH Vision Goal: RH LTG Vision (Specify) Outcome: Progressing

## 2019-04-03 NOTE — Progress Notes (Signed)
Occupational Therapy Session Note  Patient Details  Name: Beth Foster MRN: [id number] Date of Birth: 11/17/1928  Today's Date: 04/03/2019 OT Individual Time: 1015-1055 OT Individual Time Calculation (min): 40 min  (missed 30 min for second session due to fatigue)   Short Term Goals: Week 3:  OT Short Term Goal 1 (Week 3): STG = LTGs  Skilled Therapeutic Interventions/Progress Updates:    Visit 1:  No c/o pain.  Pt was already dressed and ready for the day. She declined bathing during this session.  Pt did agree to try to toilet.  With cues for foot placement, pt was able to rise to stand with S to her RW. She ambulated to toilet with CGA.  CGA with balance as she managed clothing down over hips. Sat and did have a small BM.  She stood with min A to cleanse and min A to pull pants over hips.  Ambulated to sink to wash hands with increasing need for A to balance as she was getting dizzier.  Pt sat to rest.  Discussed where she will be going tomorrow and talked about her life and things she enjoys.  Pt very tired but willing to rest in her w/c. Belt alarm on and all needs met.   Visit 2:  Pt in bed, pt awake but stated she was too tired to do anything and her finger was sore for the blood sugar check.  Pt declined do any bed level exercises.  "Oh honey, I have had enough for today!" Pt comfortable in bed with all needs met.   Therapy Documentation Precautions:  Precautions Precautions: Fall Restrictions Weight Bearing Restrictions: No    Vital Signs: Therapy Vitals Temp: 97.9 F (36.6 C) Pulse Rate: 72 Resp: 17 BP: (!) 134/55 Patient Position (if appropriate): Lying Oxygen Therapy SpO2: 96 % O2 Device: Room Air Pain: Pain Assessment Pain Scale: 0-10 Pain Score: 0-No pain ADL: ADL Eating: Set up Where Assessed-Eating: Edge of bed Grooming: Setup Where Assessed-Grooming: Sitting at sink Upper Body Bathing: Supervision/safety Where Assessed-Upper Body Bathing: Other  (Comment)(toilet) Lower Body Bathing: Contact guard Where Assessed-Lower Body Bathing: Other (Comment)(toilet) Upper Body Dressing: Minimal assistance Where Assessed-Upper Body Dressing: Chair Lower Body Dressing: Minimal assistance Where Assessed-Lower Body Dressing: Chair Toileting: Contact guard Where Assessed-Toileting: Toilet Toilet Transfer: Contact guard Toilet Transfer Method: Stand pivot, Ambulating Toilet Transfer Equipment: Grab bars  Therapy/Group: Individual Therapy  , 04/03/2019, 8:32 AM 

## 2019-04-03 NOTE — Progress Notes (Signed)
Social Work Patient ID: Beth Foster, female   DOB: 1929/04/01, 84 y.o.   MRN: [id number]  Bed offer and insurance authorization for [facility], MD reports not medically ready today but tomorrow due to foley issues. Granddaughter here and aware will work on [employer].

## 2019-04-03 NOTE — Progress Notes (Signed)
On call provider Pam, PA notified of no output via foley. Pt bladder scanned for 400 ml after placement.  Verbal order given to monitor output until the morning.

## 2019-04-03 NOTE — Progress Notes (Signed)
Physical Therapy Discharge Summary  Patient Details  Name: Beth Foster MRN: [id number] Date of Birth: 05/24/1928  Today's Date: 04/03/2019  Patient has met 9 of 11 long term goals due to improved activity tolerance, improved balance, improved postural control, increased strength, increased range of motion, ability to compensate for deficits, functional use of  right upper extremity, right lower extremity, left upper extremity and left lower extremity and improved attention.  Patient to discharge at an ambulatory level [place].   Patient's care partner unavailable to provide the necessary physical assistance at discharge. Pt d/c'ing to SNF.  Reasons goals not met: multifactorial balance deficits, vertigo/dizziness, poor activity tolerance  Recommendation:  Patient will benefit from ongoing skilled PT services in skilled nursing facility setting to continue to advance safe functional mobility, address ongoing impairments in balance, endurance, and minimize fall risk.  Despite her low vision, she is able to use visual cues (on the floor) to propel her w/c with supervision and encouragement.  Equipment: No equipment provided; owns a QC; heel lifts placed in shoes  Reasons for discharge: treatment goals met and discharge from hospital  Patient/family agrees with progress made and goals achieved: Yes  PT Discharge Precautions/Restrictions Precautions Precautions: Fall;Other (comment) Precaution Comments: dizziness, low blood pressure Vital Signs Therapy Vitals Temp: 98.1 F (36.7 C) Pulse Rate: 74 Resp: 18 BP: (!) 116/53 Patient Position (if appropriate): Lying Oxygen Therapy SpO2: 97 % O2 Device: Room Air Pain Pain Assessment Pain Score: 0-No pain Vision/Perception  Vision - Assessment Additional Comments: decreased attention to L; nystagmus at rest Perception Perception: Within Functional Limits Praxis Praxis: Intact  Cognition Overall Cognitive Status:  Impaired/Different from baseline Arousal/Alertness: Awake/alert Orientation Level: Oriented X4 Memory: Impaired Memory Impairment: Decreased recall of new information;Storage deficit;Retrieval deficit Awareness: Impaired Awareness Impairment: Emergent impairment Problem Solving: Impaired Sensation Sensation Light Touch: Appears Intact Proprioception: Appears Intact Coordination Heel Shin Test: = bil; WNLS; faster rate than admission Motor  Motor Motor: Within Functional Limits;Abnormal postural alignment and control Motor - Discharge Observations: PTA, scoliosis which affects balance , in addition to CVA  Mobility Bed Mobility Bed Mobility: Supine to Sit Rolling Right: Supervision/verbal cueing Rolling Left: Supervision/Verbal cueing Supine to Sit: Contact Guard/Touching assist Transfers Transfers: Stand Pivot Transfers;Stand to Sit;Sit to Stand Sit to Stand: Moderate Assistance - Patient 50-74%;Minimal Assistance - Patient > 75% Stand to Sit: Contact Guard/Touching assist Stand Pivot Transfers: Minimal Assistance - Patient > 75% Stand Pivot Transfer Details: Verbal cues for precautions/safety Transfer (Assistive device): Small based quad cane Locomotion  Gait Gait Assistance: Minimal Assistance - Patient > 75%;Moderate Assistance - Patient 50-74%(mod assist for turns) Gait Distance (Feet): 50 Feet Assistive device: Rolling walker Gait Gait: Yes Gait Pattern: Impaired Gait Pattern: Narrow base of support;Lateral trunk lean to left;Decreased step length - right;Decreased step length - left;Decreased hip/knee flexion - left;Decreased hip/knee flexion - right;Decreased dorsiflexion - left;Decreased dorsiflexion - right;Trunk flexed Gait velocity: 1.31'/sec for 10 MWT Stairs / Additional Locomotion Stairs: Yes Stairs Assistance: Minimal Assistance - Patient > 75% Stair Management Technique: Two rails Number of Stairs: 12 Height of Stairs: 6(and 3) Wheelchair  Mobility Wheelchair Mobility: Yes Wheelchair Assistance: Chartered loss adjuster: Both upper extremities Wheelchair Parts Management: Needs assistance Distance: 50  Trunk/Postural Assessment  Cervical Assessment Cervical Assessment: Exceptions to WFL(limited extension) Thoracic Assessment Thoracic Assessment: Exceptions to WFL(kyphotic, C curve scoliosis, R convexity) Lumbar Assessment Lumbar Assessment: Exceptions to WFL(psoterior pelvic tilt, Increased wt bearing L) Postural Control Postural Control: Deficits on evaluation Trunk Control:  at times, posterior > L lean upon standing Righting Reactions: delayed trunk righting Protective Responses: absent; delayed hip strategy and inadequate ankle strategy, but improved since admission Postural Limitations: scoliosis; LLE shorter than RLE, chronic  Balance Static Sitting Balance Static Sitting - Balance Support: Feet supported Static Sitting - Level of Assistance: 5: Stand by assistance Dynamic Sitting Balance Dynamic Sitting - Balance Support: Feet supported Dynamic Sitting - Level of Assistance: 5: Stand by assistance Static Standing Balance Static Standing - Balance Support: No upper extremity supported Static Standing - Level of Assistance: 4: Min assist Dynamic Standing Balance Dynamic Standing - Balance Support: Bilateral upper extremity supported Dynamic Standing - Level of Assistance: 4: Min assist Extremity Assessment      RLE Assessment RLE Assessment: Within Functional Limits Passive Range of Motion (PROM) Comments: approx 2 degrees ankle DF; improved since CIR stay General Strength Comments: WFLS LLE Assessment LLE Assessment: Within Functional Limits Passive Range of Motion (PROM) Comments: 10; increased during CIR stay General Strength Comments: grossly WFLS    Lorann Tani 04/03/2019, 5:08 PM

## 2019-04-03 NOTE — Progress Notes (Signed)
Social Work Discharge Note   The overall goal for the admission was met for:   Discharge location: No-[facility]  Length of Stay: No-22 DAYS  Discharge activity level: No-MIN ASSIST LEVEL  Home/community participation: Yes  Services provided included: MD, RD, PT, OT, SLP, RN, CM, Pharmacy, Neuropsych and SW  Financial Services: Private Insurance: [employer]  Follow-up services arranged: Other: NHP  Comments (or additional information):PT NOT AT A LEVEL WHERE GRANDDAUGHTER CAN MANAGE HER AND NEEDS MORE REHAB. WHILE HERE DECIDED TO BE A DNR-PER PT'S WISHES  Patient/Family verbalized understanding of follow-up arrangements: Yes  Individual responsible for coordination of the follow-up plan: ANA-GRANDDAUGHTER/POA  Confirmed correct DME delivered: Elease Hashimoto 04/03/2019    Elease Hashimoto

## 2019-04-03 NOTE — Progress Notes (Signed)
Speech Language Pathology Daily Session Note  Patient Details  Name: Beth Foster MRN: [id number] Date of Birth: 25-Mar-1929  Today's Date: 04/03/2019 SLP Individual Time: 0725-0755 SLP Individual Time Calculation (min): 30 min  Short Term Goals: Week 3: SLP Short Term Goal 1 (Week 3): Pt will utilizie memory compensatory strategies to aid in recall of novel, daily information with Min A verbal cues. SLP Short Term Goal 2 (Week 3): Pt will complete functional basic problem solving tasks pertaining to ADLs with Supervision A verbal cues. SLP Short Term Goal 3 (Week 3): Pt will demonstrate self-awareness and self-correction of functional errors in problem solving tasks with min A verbal cues. SLP Short Term Goal 4 (Week 3): Pt will demonstrate selective attention in midly distracting environment for 15 minute intervals with Supervision A verbal cues for redirection.  Skilled Therapeutic Interventions: Skilled treatment session focused on cognitive goals. Patient was independently oriented to place, month, year and day of the week but required total A for orientation to date. Patient also independently recalled that she was going to be transferred to a SNF and could recall that it "began with a C." Patient demonstrated selective attention to self-feeding in a mildly distracting environment for ~15 minutes with supervision verbal cues for redirection. Patient left upright bed with alarm on and all needs within reach. Continue with current plan of care.      Pain Pain Assessment Pain Scale: 0-10 Pain Score: 0-No pain  Therapy/Group: Individual Therapy  Beth Foster 04/03/2019, 10:09 AM

## 2019-04-03 NOTE — Progress Notes (Signed)
Spoke with family and patient, which both agreed they would like to change patient to DNR. Called Pam and let her know of the change. Awaiting orders.  Beth Foster

## 2019-04-03 NOTE — Progress Notes (Addendum)
Physical Therapy Session Note  Patient Details  Name: Beth Foster MRN: [id number] Date of Birth: April 25, 1929  Today's Date: 04/03/2019 PT Individual Time: [id number], [id number] PT Individual Time Calculation (min): 58 min , 60 min Short Term Goals:  Week 3:  PT Short Term Goal 1 (Week 3): = LTGs     Skilled Therapeutic Interventions/Progress Updates:  tx 1:   Beth Foster, CSW stated that pt will d/c to [facility].  Pt resting in bed.  No c/o of pain.  She remembered that a PT yesterday put heel lifts in her shoes and that was helpful for preventing her LOB backwards. She was oriented to month and situation, generally location.  PT donned thigh high TEDS and shoes with pt in supine.  Max assist for pt to don pull up brief and pants.  Rolling L><R with min assist in flat bed for clothing mgt, or with superivison using bed features.  Pt c/o abdominal pain during rolling.  neuromuscular re-education via multimodal cues for supine- R/L 6 x 1 straight leg raises, heel slides; 20 x 1 alternating ankle pumps. Seated- 10 x 1 heel slides, ankle pumps, in preparation for gait training.  R side lying > sit with tactile cues.  Sit> stand with CGA, extra time and cues for trunk extension and prevent LOB posteriorly.   Sit> stand to QC with min/mod assist due to LOB posteriorly. Gait training with QC x 50' with 2 turns; min assist on straight trajectory, mod assist on turn due to LOB L. PT removed heel lift in R shoe (R LE functionally longer), for 2nd bout of gait.  No improvement in balance.  W/c propulsion using bil UEs, mod cues for visual guidance, using large squares of floor tiles for orientation, supervision.  Transporter took pt to Xray at end of session.  tx 2:  Pt resting in bed; no c/o pain.  Pt rolled R and sat up with supervision, using bed features.  She donned bil shoes with set up and assistance for tying.  neuromuscular re-education in sitting via multimodal cues for R/L  marching,R/L long arc quad knee extensions with ankle pumps at end range, reciprocal scooting for pelvic dissociation.  Sit>< stand to RW iwht 2 hand on RW, one pushing up on wc, CGA and mod cues for forward wt shift.  Gait x 50' iwht RW over level tile, no turns, for 10 MWT.  Time 25 seconds.  Up/down 12 steps 2 rails iwht intermittent self selected step through method, min assist and mod cues for progressing bil hands.  Standing dual task for balance and bimanual task, folding towels and pillow cases on table in front of her; mod assist due to immediate LOB backwards upon standing, progressing to close supervision after a couple of minutes of task.  At end of session, pt resting in bed in R side lying with  Needs at hand and alarm set.  PT informed Beth Karvonen, RN of pt's abdominal pain with rolling this AM.         Therapy Documentation Precautions:  Precautions Precautions: Fall Restrictions Weight Bearing Restrictions: No Pain: Pain Assessment Pain Scale: 0-10 Pain Score: 0-No pain       Therapy/Group: Individual Therapy  Beth Foster 04/03/2019, 9:37 AM

## 2019-04-04 DIAGNOSIS — I619 Nontraumatic intracerebral hemorrhage, unspecified: Secondary | ICD-10-CM | POA: Diagnosis not present

## 2019-04-04 DIAGNOSIS — K5901 Slow transit constipation: Secondary | ICD-10-CM | POA: Diagnosis not present

## 2019-04-04 DIAGNOSIS — I614 Nontraumatic intracerebral hemorrhage in cerebellum: Secondary | ICD-10-CM | POA: Diagnosis not present

## 2019-04-04 DIAGNOSIS — R278 Other lack of coordination: Secondary | ICD-10-CM | POA: Diagnosis not present

## 2019-04-04 DIAGNOSIS — M25551 Pain in right hip: Secondary | ICD-10-CM | POA: Diagnosis not present

## 2019-04-04 DIAGNOSIS — I69391 Dysphagia following cerebral infarction: Secondary | ICD-10-CM | POA: Diagnosis not present

## 2019-04-04 DIAGNOSIS — H3530 Unspecified macular degeneration: Secondary | ICD-10-CM | POA: Diagnosis not present

## 2019-04-04 DIAGNOSIS — M79651 Pain in right thigh: Secondary | ICD-10-CM | POA: Diagnosis not present

## 2019-04-04 DIAGNOSIS — M6281 Muscle weakness (generalized): Secondary | ICD-10-CM | POA: Diagnosis not present

## 2019-04-04 DIAGNOSIS — I959 Hypotension, unspecified: Secondary | ICD-10-CM | POA: Diagnosis not present

## 2019-04-04 DIAGNOSIS — R296 Repeated falls: Secondary | ICD-10-CM | POA: Diagnosis not present

## 2019-04-04 DIAGNOSIS — I618 Other nontraumatic intracerebral hemorrhage: Secondary | ICD-10-CM | POA: Diagnosis not present

## 2019-04-04 DIAGNOSIS — G459 Transient cerebral ischemic attack, unspecified: Secondary | ICD-10-CM | POA: Diagnosis not present

## 2019-04-04 DIAGNOSIS — T83091A Other mechanical complication of indwelling urethral catheter, initial encounter: Secondary | ICD-10-CM | POA: Diagnosis not present

## 2019-04-04 DIAGNOSIS — S5000XA Contusion of unspecified elbow, initial encounter: Secondary | ICD-10-CM | POA: Diagnosis not present

## 2019-04-04 DIAGNOSIS — W19XXXA Unspecified fall, initial encounter: Secondary | ICD-10-CM | POA: Diagnosis not present

## 2019-04-04 DIAGNOSIS — N390 Urinary tract infection, site not specified: Secondary | ICD-10-CM | POA: Diagnosis not present

## 2019-04-04 DIAGNOSIS — I613 Nontraumatic intracerebral hemorrhage in brain stem: Secondary | ICD-10-CM | POA: Diagnosis not present

## 2019-04-04 DIAGNOSIS — R2681 Unsteadiness on feet: Secondary | ICD-10-CM | POA: Diagnosis not present

## 2019-04-04 DIAGNOSIS — R5381 Other malaise: Secondary | ICD-10-CM | POA: Diagnosis not present

## 2019-04-04 DIAGNOSIS — M25522 Pain in left elbow: Secondary | ICD-10-CM | POA: Diagnosis not present

## 2019-04-04 DIAGNOSIS — Z7401 Bed confinement status: Secondary | ICD-10-CM | POA: Diagnosis not present

## 2019-04-04 DIAGNOSIS — Z743 Need for continuous supervision: Secondary | ICD-10-CM | POA: Diagnosis not present

## 2019-04-04 DIAGNOSIS — G479 Sleep disorder, unspecified: Secondary | ICD-10-CM | POA: Diagnosis not present

## 2019-04-04 DIAGNOSIS — I10 Essential (primary) hypertension: Secondary | ICD-10-CM | POA: Diagnosis not present

## 2019-04-04 DIAGNOSIS — U071 COVID-19: Secondary | ICD-10-CM | POA: Diagnosis not present

## 2019-04-04 DIAGNOSIS — R2689 Other abnormalities of gait and mobility: Secondary | ICD-10-CM | POA: Diagnosis not present

## 2019-04-04 DIAGNOSIS — Z515 Encounter for palliative care: Secondary | ICD-10-CM | POA: Diagnosis not present

## 2019-04-04 DIAGNOSIS — M2550 Pain in unspecified joint: Secondary | ICD-10-CM | POA: Diagnosis not present

## 2019-04-04 DIAGNOSIS — I6389 Other cerebral infarction: Secondary | ICD-10-CM | POA: Diagnosis not present

## 2019-04-04 MED ORDER — SENNA 8.6 MG PO TABS: 2.0000 | ORAL_TABLET | Freq: Every day | ORAL | 0 refills | Status: DC

## 2019-04-04 MED ORDER — MUSCLE RUB 10-15 % EX CREA: 1.0000 "application " | TOPICAL_CREAM | Freq: Three times a day (TID) | CUTANEOUS | 0 refills | Status: DC

## 2019-04-04 MED ORDER — FLEET ENEMA 7-19 GM/118ML RE ENEM: 1.0000 | ENEMA | Freq: Every day | RECTAL | 0 refills | Status: AC | PRN

## 2019-04-04 MED ORDER — FLUCONAZOLE 100 MG PO TABS: 100.0000 mg | ORAL_TABLET | Freq: Every day | ORAL | Status: DC

## 2019-04-04 MED ORDER — PANTOPRAZOLE SODIUM 40 MG PO TBEC: 40.0000 mg | DELAYED_RELEASE_TABLET | Freq: Every day | ORAL | Status: DC

## 2019-04-04 MED ORDER — TAMSULOSIN HCL 0.4 MG PO CAPS: 0.8000 mg | ORAL_CAPSULE | Freq: Every day | ORAL | Status: AC

## 2019-04-04 MED ORDER — DILTIAZEM HCL ER COATED BEADS 180 MG PO CP24: 180.0000 mg | ORAL_CAPSULE | Freq: Every day | ORAL | Status: DC

## 2019-04-04 MED ORDER — MELATONIN 3 MG PO TABS: 1.5000 mg | ORAL_TABLET | Freq: Every day | ORAL | 0 refills | Status: DC

## 2019-04-04 MED ORDER — LATANOPROST 0.005 % OP SOLN: 1.0000 [drp] | Freq: Every day | OPHTHALMIC | 12 refills | Status: AC

## 2019-04-04 MED ORDER — ENSURE ENLIVE PO LIQD: 237.0000 mL | Freq: Three times a day (TID) | ORAL | 12 refills | Status: DC

## 2019-04-04 MED ORDER — LIDOCAINE 5 % EX PTCH: 1.0000 | MEDICATED_PATCH | CUTANEOUS | 0 refills | Status: DC

## 2019-04-04 MED ORDER — POLYETHYLENE GLYCOL 3350 17 G PO PACK: 17.0000 g | PACK | Freq: Every day | ORAL | 0 refills | Status: AC

## 2019-04-04 NOTE — Progress Notes (Addendum)
Speech Language Pathology Discharge Summary  Patient Details  Name: Beth Foster MRN: [id number] Date of Birth: 04-Jan-1929  Patient has met 6 of 6 long term goals.  Patient to discharge at [facility] level.   Reasons goals not met: N/A   Clinical Impression/Discharge Summary: Patient has made excellent gains and has met 6 of 6 LTGs this admission. Currently, patient requires overall Mod A multimodal cues for recall of functional information and supervision-Min A verbal cues for basic problem solving, attention and awareness in order to complete functional and familiar tasks safely. Patient and family education is complete. Patient's family is unable to provide the necessary level of assistance needed at this time, therefore, patient will discharge to a SNF. Patient would benefit from f/u SLP services to maximize her cognitive functioning and overall functional independence to reduce caregiver burden.    Care Partner:  Caregiver Able to Provide Assistance: No  Type of Caregiver Assistance: Physical;Cognitive  Recommendation:  Skilled Nursing facility;24 hour supervision/assistance  Rationale for SLP Follow Up: Maximize cognitive function and independence;Reduce caregiver burden   Equipment: N/A   Reasons for discharge: Treatment goals met;Discharged from hospital   Patient/Family Agrees with Progress Made and Goals Achieved: Yes    [place], [place] 04/04/2019, 6:49 AM

## 2019-04-04 NOTE — Patient Care Conference (Signed)
Inpatient RehabilitationTeam Conference and Plan of Care Update Date: 04/03/2019   Time: 11:05 AM   Patient Name: Beth Foster      Medical Record Number: [id number]  Date of Birth: 1928-07-04 Sex: Female         Room/Bed: 4W17C/4W17C-01 Payor Info: Payor: [employer] MEDICARE / Plan: [facility] MEDICARE / Product Type: *No Product type* /    Admit Date/Time:  03/12/2019  4:38 PM  Primary Diagnosis:  ICH (intracerebral hemorrhage) ([place])  Patient Active Problem List   Diagnosis Date Noted  . Vaginal candidiasis   . Prediabetes   . Recurrent UTI   . SOB (shortness of breath)   . Acute lower UTI   . Leukopenia   . Hypoalbuminemia due to protein-calorie malnutrition ([place])   . Urinary retention   . Benign essential HTN   . Essential hypertension 03/12/2019  . Hyperlipidemia 03/12/2019  . UTI (urinary tract infection) 03/12/2019  . Enterococcus UTI   . Macular degeneration   . Cognitive deficit, post-stroke   . Pressure injury of skin 03/09/2019  . ICH (intracerebral hemorrhage) (H[facility]) w/ IVH, L cerebellar 03/08/2019  . Vascular dementia without behavioral disturbance ([place]) 02/28/2019  . Cerebrovascular disease 02/28/2019  . Visual field cut 02/28/2019  . Slow transit constipation 02/28/2019  . Rectal bleeding 02/28/2019  . Venous insufficiency 07/15/2016  . Visual hallucinations 07/15/2016  . OAB (overactive bladder) 07/15/2016  . Cellulitis 07/09/2016  . Cellulitis of right lower extremity 07/09/2016  . Neck pain 01/15/2016  . Left cervical lymphadenopathy 01/08/2016  . Cough 06/09/2015  . Dyspnea 06/09/2015  . Acute bronchitis 06/09/2015  . Allergic rhinitis 04/15/2015  . Syncope and collapse 01/10/2015  . Contusion of left supra orbit 01/10/2015  . Laceration of right eyebrow 01/10/2015  . DOE (dyspnea on exertion) 01/10/2015  . Exertional chest pain 01/10/2015  . Fatigue 01/10/2015  . Acute hyperglycemia 01/10/2015  . Hemorrhoids 10/28/2013  . Vitamin D deficiency  10/28/2013  . Painful lumpy right breast 12/06/2012  . HLD (hyperlipidemia) 12/06/2012  . Essential hypertension, benign 12/06/2012  . Malignant neoplasm of breast (female), unspecified site   . Mild cognitive impairment with memory loss 08/20/2012  . Gait disorder 08/20/2012  . Wound, open, head 08/20/2012  . GERD (gastroesophageal reflux disease) 08/20/2012    Expected Discharge Date: Expected Discharge Date: 04/04/19  Team Members Present: Physician leading conference: Dr. Delice Lesch Social Worker Present: Ovidio Kin, LCSW Nurse Present: Other (comment)(Blair [place]anti-LPN) Case manager: Karene Fry, RN PT Present: Michaelene Song, PT OT Present: Meriel Pica, OT SLP Present: Weston Anna, SLP PPS Coordinator present : Gunnar Fusi, SLP     Current Status/Progress Goal Weekly Team Focus  Bowel/Bladder   pt is incontinent of b/b, LBM 03/31/19. Pt having acute urinary retention. Order for 12 fr foley, placed with urine return. Foley only drained 200 ml at this time, bladder scan showed 400. On call proivder notified.  Foley in place and draining properly.  Continue timed toileting and PRN   Swallow/Nutrition/ Hydration             ADL's   S UB self care, min A LB self care and toileting, min A to S with sit to stand, min to CGA short distance ambulation with RW.  continues to c/o dizziness, fatigue, shortness of breath.  LTGs of bathing and toileting and transfers downgraded to min A.  Goals of dressing and stand balance to remain at min A.  ADL training, functional transfers, balance/postural awareness,  visual compensation, cognitive, endurance, pt/family education   Mobility   min assist bed mobility, min assist sit<>stand and bed<>chair transfers, gait varying distance of 65-172f using RW or quad-cane with min assist  supervision bed mobility, min assist transfers, min assist gait 547fwith LRAD  activity tolerance/endurance, transfers, gait training with LRAD, standing  balance, B LE strength, pt education and awareness   Communication   Supervision  Supervision A  Goal Met   Safety/Cognition/ Behavioral Observations  Min-Mod A  Min-Supervision A  attention, recall with use of strategies, basic problem solving   Pain   Pt c/o of lower back pain  pain less than 4  Assess pain Qshift/PRN   Skin   Pt has foam to bottom for MASD. No other signs of infection or breakdown  Prevent skin from further breakdown and free of infection.  Assess skin Qshift.      *See Care Plan and progress notes for long and short-term goals.     Barriers to Discharge  Current Status/Progress Possible Resolutions Date Resolved   Nursing                  PT                    OT                  SLP                SW                Discharge Planning/Teaching Needs:  Awaiting insurance approval for NH, have a bed offer working on this for transfer today or tomorrow.      Team Discussion: Foley issues, flushed, not working yesterday.  Urology notified.  Has yeast, diflucan given.  RN - foley dark, pushing fluids, no pain, no skin issues.  OT did better with posture control, stood S yesterday, S/min A mobility and self care.  PT min 120' RW/quad cane, encouraged drinking.  SLP increased orientation today, working on basic memory.   Revisions to Treatment Plan: N/A     Medical Summary Current Status: Balance deficits with left lean as well as cognitive deficits affecting ADLs and mobility secondary to  left cerebellar hematoma with penetration of 4th ventricle and dependent within occipital horns and lateral ventricles Weekly Focus/Goal: Improve mobility, urinary retention, orthostasis, HTN, sleep, leukopenia, hypokalemia, vaginal candidiasis  Barriers to Discharge: Behavior;Medical stability;Nutrition means;Decreased family/caregiver support   Possible Resolutions to Barriers: Therapies, foley in now, follow vitals, follow labs, anti-fungal ordered   Continued Need  for Acute Rehabilitation Level of Care: The patient requires daily medical management by a physician with specialized training in physical medicine and rehabilitation for the following reasons: Direction of a multidisciplinary physical rehabilitation program to maximize functional independence : Yes Medical management of patient stability for increased activity during participation in an intensive rehabilitation regime.: Yes Analysis of laboratory values and/or radiology reports with any subsequent need for medication adjustment and/or medical intervention. : Yes   I attest that I was present, lead the team conference, and concur with the assessment and plan of the team.   LoRetta Diones1/04/2019, 1:58 PM  Team conference was held via web/ teleconference [place] to [place]

## 2019-04-04 NOTE — Progress Notes (Signed)
Called Fatada (Nurse) at [facility] and gave report. Patient discharged off floor with ambulance transport with her bag. Will be transporting to [facility] room [place]. Patient had no questions at time of discharge.  [place]

## 2019-04-04 NOTE — Progress Notes (Signed)
Followed up with granddaughter regarding patient's DNR status. She does not want any heroic measures as has seen a significant change in her grandmother since stroke and is concerned about QOL,. She reports that patient is tired and wants to sleep a lot--she does not want a urologic consult as does not want her GM tortured. She would like to discuss [place] and palliative care recommended at SNF.

## 2019-04-04 NOTE — Progress Notes (Addendum)
[facility] PHYSICAL MEDICINE & REHABILITATION PROGRESS NOTE  Subjective/Complaints: Patient seen sitting up in bed this morning.  She states she slept well overnight.  She slept fairly well per sleep chart.  Discussed bladder function with nursing x3; patient with 350 cc of output overnight and 100 cc in the last 2 hours.  ROS: Denies CP, SOB, N/V/D  Objective: Vital Signs: Blood pressure 120/61, pulse 66, temperature 97.9 F (36.6 C), temperature source Oral, resp. rate 17, height 5\' 5"  (1.651 m), weight 64.4 kg, SpO2 92 %. Dg Hip Unilat With Pelvis 2-3 Views Right  Result Date: 04/03/2019 CLINICAL DATA:  85 year old female with right hip pain. No known injury. EXAM: DG HIP (WITH OR WITHOUT PELVIS) 2-3V RIGHT COMPARISON:  None. FINDINGS: There is no acute fracture or dislocation. The bones are osteopenic. Mild bilateral hip osteoarthritic changes. Degenerative changes of the lower lumbar spine. The soft tissues are unremarkable. IMPRESSION: 1. No acute fracture or dislocation. 2. Osteoarthritic changes of the hips. Electronically Signed   By: Anner Crete M.D.   On: 04/03/2019 09:54   Recent Labs    04/03/19 0447  WBC 3.8*  HGB 12.2  HCT 37.6  PLT 170   Recent Labs    04/03/19 0447  NA 139  K 3.7  CL 106  CO2 24  GLUCOSE 101*  BUN 14  CREATININE 0.82  CALCIUM 9.3    Physical Exam: BP 120/61 (BP Location: Right Arm)   Pulse 66   Temp 97.9 F (36.6 C) (Oral)   Resp 17   Ht 5\' 5"  (1.651 m)   Wt 64.4 kg   SpO2 92%   BMI 23.63 kg/m   Constitutional: No distress . Vital signs reviewed. HENT: Normocephalic.  Atraumatic. Eyes: EOMI. No discharge. Cardiovascular: No JVD. Respiratory: Normal effort.  No stridor. GI: Non-distended. Skin: Warm and dry.  Intact. Psych: Normal mood.  Normal behavior. Musc: No edema in extremities.  No tenderness in extremities. Neurological:  Alert and oriented x2 (baseline per granddaughter) Motor:  Right upper extremity: 4-4+/5  proximal to distal, stable Left upper extremities: 4-4+/5 proximal distal, stable Right lower extremity: 4-4+/5 proximal distal, stable (some pain inhibition) Left lower extremity: 4-4+/5 proximal to distal, stable  Assessment/Plan: 1. Functional deficits secondary to left cerebellar hematoma which require 3+ hours per day of interdisciplinary therapy in a comprehensive inpatient rehab setting.  Physiatrist is providing close team supervision and 24 hour management of active medical problems listed below.  Physiatrist and rehab team continue to assess barriers to discharge/monitor patient progress toward functional and medical goals  Care Tool:  Bathing    Body parts bathed by patient: Chest, Abdomen, Left arm, Right arm, Face, Left upper leg, Right upper leg, Front perineal area, Buttocks   Body parts bathed by helper: Right lower leg, Left lower leg     Bathing assist Assist Level: Minimal Assistance - Patient > 75%     Upper Body Dressing/Undressing Upper body dressing   What is the patient wearing?: Button up shirt, Bra    Upper body assist Assist Level: Minimal Assistance - Patient > 75%    Lower Body Dressing/Undressing Lower body dressing      What is the patient wearing?: Pants, Underwear/pull up     Lower body assist Assist for lower body dressing: Moderate Assistance - Patient 50 - 74%     Toileting Toileting    Toileting assist Assist for toileting: Minimal Assistance - Patient > 75%     Transfers Chair/bed  transfer  Transfers assist     Chair/bed transfer assist level: Contact Guard/Touching assist     Locomotion Ambulation   Ambulation assist      Assist level: Minimal Assistance - Patient > 75% Assistive device: Walker-rolling Max distance: 50   Walk 10 feet activity   Assist     Assist level: Minimal Assistance - Patient > 75% Assistive device: Walker-rolling   Walk 50 feet activity   Assist Walk 50 feet with 2 turns  activity did not occur: Safety/medical concerns(Per report )  Assist level: Moderate Assistance - Patient - 50 - 74% Assistive device: Walker-rolling    Walk 150 feet activity   Assist Walk 150 feet activity did not occur: Safety/medical concerns(fatigue)    Assistive device: Walker-rolling    Walk 10 feet on uneven surface  activity   Assist Walk 10 feet on uneven surfaces activity did not occur: Safety/medical concerns(balance deficits)   Assist level: Contact Guard/Touching assist     Wheelchair     Assist   Type of Wheelchair: Manual    Wheelchair assist level: Supervision/Verbal cueing Max wheelchair distance: 50    Wheelchair 50 feet with 2 turns activity    Assist    Wheelchair 50 feet with 2 turns activity did not occur: Safety/medical concerns(fatigue)   Assist Level: Supervision/Verbal cueing   Wheelchair 150 feet activity     Assist Wheelchair 150 feet activity did not occur: Safety/medical concerns(fatigue)          Medical Problem List and Plan: 1. Balance deficits with left lean as well as cognitive deficits affecting ADLs and mobility secondary to  left cerebellar hematoma with penetration of 4th ventricle and dependent within occipital horns and lateral ventricles  DC today to SNF  Will see patient for hospital follow-up in 1 month post-discharge 2.  Antithrombotics: -DVT/anticoagulation:  Pharmaceutical: Lovenox             -antiplatelet therapy: N/A 3. HA/back pain/Pain Management: Continue tylenol prn   Lidoderm patch ordered on 11/2 4. Mood: LCSW to follow for evaluation and support.              -antipsychotic agents: N/A 5. Neuropsych: This patient is not fully capable of making decisions on her own behalf. 6. Skin/Wound Care: Routine pressure relief measures.  Maintain adequate nutrition and hydration status. 7. Fluids/Electrolytes/Nutrition:      -Offer nutritional supplements  P.o. variable, but overall improving on  11/10, no documentation on 11/11, continues to take supplements 8.  HTN: Monitor   Cardizem daily, decreased to 180 on 10/22   Vitals:   04/03/19 2033 04/04/19 0425  BP:  120/61  Pulse:  66  Resp: 17 17  Temp: 97.9 F (36.6 C) 97.9 F (36.6 C)  SpO2:  92%   Controlled on 11/12  Orthostatics negative on 11/10 9.  Macular degeneration: Visual hallucinations have been worsening.  Exacerbated by bilateral cataracts as well as elevated pressures left eye.    Started Xalatan (new RX per family)  68. Urinary retention:   Flomax started on 10/23, increased on 10/26  Bethanechol started on 10/29, increased again on 11/2  Continue I/O's, discussed with nursing use of coude caths, discussed with patient as well.  Foley in place now  Discussed with urology regarding the inconsistent bladder scans.  Recommended cath flushes as needed and replacing the Foley as needed  After discussion with nurses, patient with urine output-mildly amber, likely some component of dehydration.  Will have patient follow-up  with urology as outpatient. 11. GERD: Continue Protonix. 12.  Hypoalbuminemia  Supplement initiated on 10/21 13. Constipation  Bowel meds increased on 10/21, decreased on 11/4, increased on 11/11  Improving 14.  Post stroke dysphagia-resolved  Advance to regular diet, thin liquids 15. FUO: Resolved  Chest x-ray personally reviewed, unremarkable for infection 16.  Sleep disturbance  Sleep chart  Ambien started 10/23 with some benefit, increased on 10/27, changed to melatonin on 11/5  Improving overall 17.  Prediabetes  Mildly elevated on 11/11 18.  Leukopenia:   WBCs 3.8 on 11/11  Continue to monitor. 19.  Acute lower UTI-Enterococcus faecalis  Macrobid started on 11/2-11/12 20. SOB-appears to be more limited endurance  O2 stats stable per nursing  Chest x-ray personally reviewed, unremarkable for acute process  Continue to monitor 21.  Hypokalemia  Potassium 3.7 on  11/11  Supplemented x1 on 11/9 22. Vaginal candidiasis  Started on Diflucan on 11/10  >30 minutes spent in total for discharge between myself and PA regarding discharge venue, candidiasis, urinary retention-with discussion with urology  LOS: 23 days A FACE TO FACE EVALUATION WAS PERFORMED  Ankit Lorie Phenix 04/04/2019, 8:30 AM

## 2019-04-04 NOTE — Progress Notes (Signed)
Pt was educated on importance of having the suppository due to no BM for 4 days. Pt continued to refuse the medication.

## 2019-04-05 DIAGNOSIS — R5381 Other malaise: Secondary | ICD-10-CM | POA: Diagnosis not present

## 2019-04-05 DIAGNOSIS — H3530 Unspecified macular degeneration: Secondary | ICD-10-CM | POA: Diagnosis not present

## 2019-04-05 DIAGNOSIS — N390 Urinary tract infection, site not specified: Secondary | ICD-10-CM | POA: Diagnosis not present

## 2019-04-05 DIAGNOSIS — I618 Other nontraumatic intracerebral hemorrhage: Secondary | ICD-10-CM | POA: Diagnosis not present

## 2019-04-11 DIAGNOSIS — S5000XA Contusion of unspecified elbow, initial encounter: Secondary | ICD-10-CM | POA: Diagnosis not present

## 2019-04-11 DIAGNOSIS — I619 Nontraumatic intracerebral hemorrhage, unspecified: Secondary | ICD-10-CM | POA: Diagnosis not present

## 2019-04-11 DIAGNOSIS — W19XXXA Unspecified fall, initial encounter: Secondary | ICD-10-CM | POA: Diagnosis not present

## 2019-04-12 ENCOUNTER — Ambulatory Visit: Payer: Medicare Other

## 2019-04-15 DIAGNOSIS — N390 Urinary tract infection, site not specified: Secondary | ICD-10-CM | POA: Diagnosis not present

## 2019-04-15 DIAGNOSIS — S5000XA Contusion of unspecified elbow, initial encounter: Secondary | ICD-10-CM | POA: Diagnosis not present

## 2019-04-15 DIAGNOSIS — K5901 Slow transit constipation: Secondary | ICD-10-CM | POA: Diagnosis not present

## 2019-04-15 DIAGNOSIS — Z515 Encounter for palliative care: Secondary | ICD-10-CM | POA: Diagnosis not present

## 2019-04-15 DIAGNOSIS — M25551 Pain in right hip: Secondary | ICD-10-CM | POA: Diagnosis not present

## 2019-04-22 DIAGNOSIS — R296 Repeated falls: Secondary | ICD-10-CM | POA: Diagnosis not present

## 2019-04-22 DIAGNOSIS — I6389 Other cerebral infarction: Secondary | ICD-10-CM | POA: Diagnosis not present

## 2019-04-22 DIAGNOSIS — N390 Urinary tract infection, site not specified: Secondary | ICD-10-CM | POA: Diagnosis not present

## 2019-04-22 DIAGNOSIS — T83091A Other mechanical complication of indwelling urethral catheter, initial encounter: Secondary | ICD-10-CM | POA: Diagnosis not present

## 2019-04-25 DIAGNOSIS — R5381 Other malaise: Secondary | ICD-10-CM | POA: Diagnosis not present

## 2019-04-25 DIAGNOSIS — I10 Essential (primary) hypertension: Secondary | ICD-10-CM | POA: Diagnosis not present

## 2019-04-25 DIAGNOSIS — I618 Other nontraumatic intracerebral hemorrhage: Secondary | ICD-10-CM | POA: Diagnosis not present

## 2019-04-25 DIAGNOSIS — N390 Urinary tract infection, site not specified: Secondary | ICD-10-CM | POA: Diagnosis not present

## 2019-05-06 DIAGNOSIS — R2689 Other abnormalities of gait and mobility: Secondary | ICD-10-CM | POA: Diagnosis not present

## 2019-05-06 DIAGNOSIS — R2681 Unsteadiness on feet: Secondary | ICD-10-CM | POA: Diagnosis not present

## 2019-05-06 DIAGNOSIS — M6281 Muscle weakness (generalized): Secondary | ICD-10-CM | POA: Diagnosis not present

## 2019-05-06 DIAGNOSIS — I613 Nontraumatic intracerebral hemorrhage in brain stem: Secondary | ICD-10-CM | POA: Diagnosis not present

## 2019-05-06 DIAGNOSIS — R278 Other lack of coordination: Secondary | ICD-10-CM | POA: Diagnosis not present

## 2019-05-07 DIAGNOSIS — R2681 Unsteadiness on feet: Secondary | ICD-10-CM | POA: Diagnosis not present

## 2019-05-07 DIAGNOSIS — R278 Other lack of coordination: Secondary | ICD-10-CM | POA: Diagnosis not present

## 2019-05-07 DIAGNOSIS — I613 Nontraumatic intracerebral hemorrhage in brain stem: Secondary | ICD-10-CM | POA: Diagnosis not present

## 2019-05-07 DIAGNOSIS — U071 COVID-19: Secondary | ICD-10-CM | POA: Diagnosis not present

## 2019-05-07 DIAGNOSIS — M6281 Muscle weakness (generalized): Secondary | ICD-10-CM | POA: Diagnosis not present

## 2019-05-07 DIAGNOSIS — R2689 Other abnormalities of gait and mobility: Secondary | ICD-10-CM | POA: Diagnosis not present

## 2019-05-08 DIAGNOSIS — R278 Other lack of coordination: Secondary | ICD-10-CM | POA: Diagnosis not present

## 2019-05-08 DIAGNOSIS — I613 Nontraumatic intracerebral hemorrhage in brain stem: Secondary | ICD-10-CM | POA: Diagnosis not present

## 2019-05-08 DIAGNOSIS — R2681 Unsteadiness on feet: Secondary | ICD-10-CM | POA: Diagnosis not present

## 2019-05-08 DIAGNOSIS — R2689 Other abnormalities of gait and mobility: Secondary | ICD-10-CM | POA: Diagnosis not present

## 2019-05-08 DIAGNOSIS — M6281 Muscle weakness (generalized): Secondary | ICD-10-CM | POA: Diagnosis not present

## 2019-05-09 ENCOUNTER — Encounter: Payer: Medicare Other | Admitting: Physical Medicine & Rehabilitation

## 2019-05-09 DIAGNOSIS — M6281 Muscle weakness (generalized): Secondary | ICD-10-CM | POA: Diagnosis not present

## 2019-05-09 DIAGNOSIS — R2689 Other abnormalities of gait and mobility: Secondary | ICD-10-CM | POA: Diagnosis not present

## 2019-05-09 DIAGNOSIS — R278 Other lack of coordination: Secondary | ICD-10-CM | POA: Diagnosis not present

## 2019-05-09 DIAGNOSIS — I613 Nontraumatic intracerebral hemorrhage in brain stem: Secondary | ICD-10-CM | POA: Diagnosis not present

## 2019-05-09 DIAGNOSIS — R2681 Unsteadiness on feet: Secondary | ICD-10-CM | POA: Diagnosis not present

## 2019-05-10 DIAGNOSIS — R2689 Other abnormalities of gait and mobility: Secondary | ICD-10-CM | POA: Diagnosis not present

## 2019-05-10 DIAGNOSIS — M6281 Muscle weakness (generalized): Secondary | ICD-10-CM | POA: Diagnosis not present

## 2019-05-10 DIAGNOSIS — I613 Nontraumatic intracerebral hemorrhage in brain stem: Secondary | ICD-10-CM | POA: Diagnosis not present

## 2019-05-10 DIAGNOSIS — R278 Other lack of coordination: Secondary | ICD-10-CM | POA: Diagnosis not present

## 2019-05-10 DIAGNOSIS — R2681 Unsteadiness on feet: Secondary | ICD-10-CM | POA: Diagnosis not present

## 2019-05-12 DIAGNOSIS — R278 Other lack of coordination: Secondary | ICD-10-CM | POA: Diagnosis not present

## 2019-05-12 DIAGNOSIS — M6281 Muscle weakness (generalized): Secondary | ICD-10-CM | POA: Diagnosis not present

## 2019-05-12 DIAGNOSIS — R2681 Unsteadiness on feet: Secondary | ICD-10-CM | POA: Diagnosis not present

## 2019-05-12 DIAGNOSIS — R2689 Other abnormalities of gait and mobility: Secondary | ICD-10-CM | POA: Diagnosis not present

## 2019-05-12 DIAGNOSIS — I613 Nontraumatic intracerebral hemorrhage in brain stem: Secondary | ICD-10-CM | POA: Diagnosis not present

## 2019-05-13 DIAGNOSIS — I613 Nontraumatic intracerebral hemorrhage in brain stem: Secondary | ICD-10-CM | POA: Diagnosis not present

## 2019-05-13 DIAGNOSIS — M6281 Muscle weakness (generalized): Secondary | ICD-10-CM | POA: Diagnosis not present

## 2019-05-13 DIAGNOSIS — R2689 Other abnormalities of gait and mobility: Secondary | ICD-10-CM | POA: Diagnosis not present

## 2019-05-13 DIAGNOSIS — R2681 Unsteadiness on feet: Secondary | ICD-10-CM | POA: Diagnosis not present

## 2019-05-13 DIAGNOSIS — R278 Other lack of coordination: Secondary | ICD-10-CM | POA: Diagnosis not present

## 2019-05-14 DIAGNOSIS — R278 Other lack of coordination: Secondary | ICD-10-CM | POA: Diagnosis not present

## 2019-05-14 DIAGNOSIS — I613 Nontraumatic intracerebral hemorrhage in brain stem: Secondary | ICD-10-CM | POA: Diagnosis not present

## 2019-05-14 DIAGNOSIS — U071 COVID-19: Secondary | ICD-10-CM | POA: Diagnosis not present

## 2019-05-14 DIAGNOSIS — R2689 Other abnormalities of gait and mobility: Secondary | ICD-10-CM | POA: Diagnosis not present

## 2019-05-14 DIAGNOSIS — M6281 Muscle weakness (generalized): Secondary | ICD-10-CM | POA: Diagnosis not present

## 2019-05-14 DIAGNOSIS — R2681 Unsteadiness on feet: Secondary | ICD-10-CM | POA: Diagnosis not present

## 2019-05-15 DIAGNOSIS — N319 Neuromuscular dysfunction of bladder, unspecified: Secondary | ICD-10-CM | POA: Diagnosis not present

## 2019-05-18 ENCOUNTER — Emergency Department (HOSPITAL_COMMUNITY): Payer: Medicare Other

## 2019-05-18 ENCOUNTER — Other Ambulatory Visit: Payer: Self-pay

## 2019-05-18 ENCOUNTER — Emergency Department (HOSPITAL_COMMUNITY)
Admission: EM | Admit: 2019-05-18 | Discharge: 2019-05-18 | Disposition: A | Discharge status: Home / Self Care | Payer: Medicare Other | Attending: Emergency Medicine | Admitting: Emergency Medicine

## 2019-05-18 DIAGNOSIS — M533 Sacrococcygeal disorders, not elsewhere classified: Secondary | ICD-10-CM | POA: Diagnosis not present

## 2019-05-18 DIAGNOSIS — Z8673 Personal history of transient ischemic attack (TIA), and cerebral infarction without residual deficits: Secondary | ICD-10-CM | POA: Diagnosis not present

## 2019-05-18 DIAGNOSIS — R279 Unspecified lack of coordination: Secondary | ICD-10-CM | POA: Diagnosis not present

## 2019-05-18 DIAGNOSIS — Y999 Unspecified external cause status: Secondary | ICD-10-CM | POA: Diagnosis not present

## 2019-05-18 DIAGNOSIS — W19XXXA Unspecified fall, initial encounter: Secondary | ICD-10-CM | POA: Insufficient documentation

## 2019-05-18 DIAGNOSIS — I10 Essential (primary) hypertension: Secondary | ICD-10-CM | POA: Diagnosis not present

## 2019-05-18 DIAGNOSIS — S3992XA Unspecified injury of lower back, initial encounter: Secondary | ICD-10-CM | POA: Diagnosis not present

## 2019-05-18 DIAGNOSIS — S0990XA Unspecified injury of head, initial encounter: Secondary | ICD-10-CM | POA: Diagnosis not present

## 2019-05-18 DIAGNOSIS — M2578 Osteophyte, vertebrae: Secondary | ICD-10-CM | POA: Diagnosis not present

## 2019-05-18 DIAGNOSIS — I6782 Cerebral ischemia: Secondary | ICD-10-CM | POA: Diagnosis not present

## 2019-05-18 DIAGNOSIS — R519 Headache, unspecified: Secondary | ICD-10-CM | POA: Diagnosis not present

## 2019-05-18 DIAGNOSIS — R404 Transient alteration of awareness: Secondary | ICD-10-CM | POA: Diagnosis not present

## 2019-05-18 DIAGNOSIS — Y929 Unspecified place or not applicable: Secondary | ICD-10-CM | POA: Insufficient documentation

## 2019-05-18 DIAGNOSIS — Z79899 Other long term (current) drug therapy: Secondary | ICD-10-CM | POA: Insufficient documentation

## 2019-05-18 DIAGNOSIS — Y939 Activity, unspecified: Secondary | ICD-10-CM | POA: Diagnosis not present

## 2019-05-18 DIAGNOSIS — Z91040 Latex allergy status: Secondary | ICD-10-CM | POA: Insufficient documentation

## 2019-05-18 DIAGNOSIS — G3189 Other specified degenerative diseases of nervous system: Secondary | ICD-10-CM | POA: Diagnosis not present

## 2019-05-18 DIAGNOSIS — S3993XA Unspecified injury of pelvis, initial encounter: Secondary | ICD-10-CM | POA: Diagnosis not present

## 2019-05-18 DIAGNOSIS — M545 Low back pain: Secondary | ICD-10-CM | POA: Insufficient documentation

## 2019-05-18 DIAGNOSIS — Z743 Need for continuous supervision: Secondary | ICD-10-CM | POA: Diagnosis not present

## 2019-05-18 DIAGNOSIS — R52 Pain, unspecified: Secondary | ICD-10-CM | POA: Diagnosis not present

## 2019-05-18 DIAGNOSIS — M47812 Spondylosis without myelopathy or radiculopathy, cervical region: Secondary | ICD-10-CM | POA: Diagnosis not present

## 2019-05-18 MED ORDER — ACETAMINOPHEN 500 MG PO TABS
1000.0000 mg | ORAL_TABLET | Freq: Once | ORAL | Status: AC
  Administered 2019-05-18: 1000 mg via ORAL
  Filled 2019-05-18: qty 2

## 2019-05-18 NOTE — Discharge Instructions (Signed)
Whenever someone falls it is a good idea to follow-up with your family doctor for evaluation.  They may determine that you need more assistance at home or physical therapy.  Or they may decide that you are on medicines that can make you fall down more easily.  Your imaging was negative for broken bones or bleeding into skull.  Please return for confusion vomiting

## 2019-05-18 NOTE — ED Notes (Signed)
Report called back to [place] at [facility]

## 2019-05-18 NOTE — ED Provider Notes (Signed)
[facility] DEPT Provider Note   CSN: [id number] Arrival date & time: 05/18/19  1141     History Chief Complaint  Patient presents with  . Fall    Beth Foster is a 85 y.o. female.  85 yo F with a chief complaint of a fall.  Patient states she lost her balance and fell backwards and struck the back of her head.  Also landed on her bottom.  Waning of pain to the tailbone in the back of her head.  Denies neck pain denies extremity pain.  She is unfortunately unable to get off the floor.  C-collar was placed and she forgot all about her pain in her head and her tailbone.  She feels much better now.  Denies chest pain shortness of breath abdominal pain.  The history is provided by the patient.  Fall This is a new problem. The current episode started 3 to 5 hours ago. The problem occurs rarely. The problem has been resolved. Associated symptoms include headaches. Pertinent negatives include no chest pain, no abdominal pain and no shortness of breath. Nothing aggravates the symptoms. Nothing relieves the symptoms. She has tried nothing for the symptoms. The treatment provided no relief.       Past Medical History:  Diagnosis Date  . Amaurosis fugax   . Anxiety   . Anxiety state, unspecified   . Bunion   . Cataracts, bilateral   . Sherran Needs syndrome   . Cognitive decline   . Contact dermatitis and other eczema due to other chemical products   . Edema   . External hemorrhoids without mention of complication   . GERD (gastroesophageal reflux disease)   . Glaucoma   . Hypertension   . Macular degeneration of both eyes   . Malignant neoplasm of breast (female), unspecified site dx'd 1996   rt breast; xrt   . Pain in joint, pelvic region and thigh   . Rash and other nonspecific skin eruption   . Stroke (cerebrum) ([facility])    with decrease in [facility] of right hand.   . Stroke ([facility])   . Unspecified cataract   . Unspecified late effects of  cerebrovascular disease   . Viral hepatitis A without mention of hepatic coma   . Viral hepatitis A without mention of hepatic coma   . Vitamin D deficiency     Patient Active Problem List   Diagnosis Date Noted  . Vaginal candidiasis   . Prediabetes   . Recurrent UTI   . SOB (shortness of breath)   . Acute lower UTI   . Leukopenia   . Hypoalbuminemia due to protein-calorie malnutrition ([facility])   . Urinary retention   . Benign essential HTN   . Essential hypertension 03/12/2019  . Hyperlipidemia 03/12/2019  . UTI (urinary tract infection) 03/12/2019  . Enterococcus UTI   . Macular degeneration   . Cognitive deficit, post-stroke   . Pressure injury of skin 03/09/2019  . ICH (intracerebral hemorrhage) (H[facility]) w/ IVH, L cerebellar 03/08/2019  . Vascular dementia without behavioral disturbance ([place]) 02/28/2019  . Cerebrovascular disease 02/28/2019  . Visual field cut 02/28/2019  . Slow transit constipation 02/28/2019  . Rectal bleeding 02/28/2019  . Venous insufficiency 07/15/2016  . Visual hallucinations 07/15/2016  . OAB (overactive bladder) 07/15/2016  . Cellulitis 07/09/2016  . Cellulitis of right lower extremity 07/09/2016  . Neck pain 01/15/2016  . Left cervical lymphadenopathy 01/08/2016  . Cough 06/09/2015  . Dyspnea 06/09/2015  . Acute  bronchitis 06/09/2015  . Allergic rhinitis 04/15/2015  . Syncope and collapse 01/10/2015  . Contusion of left supra orbit 01/10/2015  . Laceration of right eyebrow 01/10/2015  . DOE (dyspnea on exertion) 01/10/2015  . Exertional chest pain 01/10/2015  . Fatigue 01/10/2015  . Acute hyperglycemia 01/10/2015  . Hemorrhoids 10/28/2013  . Vitamin D deficiency 10/28/2013  . Painful lumpy right breast 12/06/2012  . HLD (hyperlipidemia) 12/06/2012  . Essential hypertension, benign 12/06/2012  . Malignant neoplasm of breast (female), unspecified site   . Mild cognitive impairment with memory loss 08/20/2012  . Gait disorder 08/20/2012  .  Wound, open, head 08/20/2012  . GERD (gastroesophageal reflux disease) 08/20/2012    Past Surgical History:  Procedure Laterality Date  . BREAST LUMPECTOMY  1995  . CARDIAC CATHETERIZATION  2004  . CHOLECYSTECTOMY  1972     OB History   No obstetric history on file.     Family History  Problem Relation Age of Onset  . Cancer Mother        lung  . Macular degeneration Brother   . Cancer Brother        brain  . Varicose Veins Father   . Varicose Veins Sister     Social History   Tobacco Use  . Smoking status: Never Smoker  . Smokeless tobacco: Never Used  Substance Use Topics  . Alcohol use: Never  . Drug use: Never    Home Medications Prior to Admission medications   Medication Sig Start Date End Date Taking? Authorizing Provider  acetaminophen (TYLENOL) 325 MG tablet Take 325 mg by mouth every 6 (six) hours as needed for mild pain or headache.    [provider]  atorvastatin (LIPITOR) 10 MG tablet Take 1 tablet (10 mg total) by mouth daily. 03/12/19 03/11/20  Donzetta Starch, NP  cholecalciferol (VITAMIN D) 1000 UNITS tablet Take 2,000 Units by mouth daily.     [provider]  diltiazem (CARDIZEM CD) 180 MG 24 hr capsule Take 1 capsule (180 mg total) by mouth at bedtime. 04/04/19   Love, Ivan Anchors, PA-C  estradiol (ESTRACE) 0.1 MG/GM vaginal cream Place 1 Applicatorful vaginally 3 (three) times a week. 10/05/18   Reed, Tiffany L, DO  feeding supplement, ENSURE ENLIVE, (ENSURE ENLIVE) LIQD Take 237 mLs by mouth 3 (three) times daily between meals. 04/04/19   Love, Ivan Anchors, PA-C  fluconazole (DIFLUCAN) 100 MG tablet Take 1 tablet (100 mg total) by mouth daily. 04/05/19   Love, Ivan Anchors, PA-C  latanoprost (XALATAN) 0.005 % ophthalmic solution Place 1 drop into both eyes at bedtime. 04/04/19   Love, Ivan Anchors, PA-C  lidocaine (LIDODERM) 5 % Place 1 patch onto the skin daily. Remove & Discard patch within 12 hours or as directed by MD 04/04/19   Love,  Ivan Anchors, PA-C  Melatonin 3 MG TABS Take 0.5 tablets (1.5 mg total) by mouth at bedtime. 04/04/19   Love, Ivan Anchors, PA-C  Menthol-Methyl Salicylate (MUSCLE RUB) 10-15 % CREA Apply 1 application topically 4 (four) times daily -  before meals and at bedtime. 04/04/19   Love, Ivan Anchors, PA-C  Multiple Vitamins-Minerals (EYE VITAMINS PO) Take 1 tablet by mouth daily.    [provider]  pantoprazole (PROTONIX) 40 MG tablet Take 1 tablet (40 mg total) by mouth daily. 04/05/19   Love, Ivan Anchors, PA-C  polyethylene glycol (MIRALAX / GLYCOLAX) 17 g packet Take 17 g by mouth daily. 04/05/19   Bary Leriche, PA-C  senna (SENOKOT) 8.6 MG TABS tablet Take 2 tablets (17.2 mg total) by mouth at bedtime. 04/04/19   Love, Ivan Anchors, PA-C  sodium phosphate (FLEET) 7-19 GM/118ML ENEM Place 133 mLs (1 enema total) rectally daily as needed for severe constipation. 04/04/19   Love, Ivan Anchors, PA-C  tamsulosin (FLOMAX) 0.4 MG CAPS capsule Take 2 capsules (0.8 mg total) by mouth daily after supper. 04/04/19   Love, Ivan Anchors, PA-C  witch hazel-glycerin (TUCKS) pad Apply topically as needed for itching. 03/14/19   Love, Ivan Anchors, PA-C    Allergies    Amoxicillin, Angiotensin receptor blockers, Beta adrenergic blockers, Cephalexin, Clindamycin/lincomycin, Hydrocortisone, Prednisone, Eggs or egg-derived products, Tape, Amoxicillin, Clindamycin/lincomycin, Corticosteroids, Hydrocortisone, Keflex [cephalexin], Latex, Latex, Other, and Reglan [metoclopramide]  Review of Systems   Review of Systems  Constitutional: Negative for chills and fever.  HENT: Negative for congestion and rhinorrhea.   Eyes: Negative for redness and visual disturbance.  Respiratory: Negative for shortness of breath and wheezing.   Cardiovascular: Negative for chest pain and palpitations.  Gastrointestinal: Negative for abdominal pain, nausea and vomiting.  Genitourinary: Negative for dysuria and urgency.  Musculoskeletal: Negative for  arthralgias and myalgias.  Skin: Negative for pallor and wound.  Neurological: Positive for headaches. Negative for dizziness.    Physical Exam Updated Vital Signs BP (!) 153/73 (BP Location: Right Arm)   Pulse (!) 102   Temp 98 F (36.7 C) (Oral)   Resp 18   SpO2 95%   Physical Exam Vitals and nursing note reviewed.  Constitutional:      General: She is not in acute distress.    Appearance: She is well-developed. She is not diaphoretic.  HENT:     Head: Normocephalic and atraumatic.  Eyes:     Pupils: Pupils are equal, round, and reactive to light.  Cardiovascular:     Rate and Rhythm: Normal rate and regular rhythm.     Heart sounds: No murmur. No friction rub. No gallop.   Pulmonary:     Effort: Pulmonary effort is normal.     Breath sounds: No wheezing or rales.  Abdominal:     General: There is no distension.     Palpations: Abdomen is soft.     Tenderness: There is no abdominal tenderness.  Musculoskeletal:        General: No tenderness.     Cervical back: Normal range of motion and neck supple.  Skin:    General: Skin is warm and dry.  Neurological:     Mental Status: She is alert and oriented to person, place, and time.  Psychiatric:        Behavior: Behavior normal.     ED Results / Procedures / Treatments   Labs (all labs ordered are listed, but only abnormal results are displayed) Labs Reviewed - No data to display  EKG None  Radiology DG Pelvis 1-2 Views  Result Date: 05/18/2019 CLINICAL DATA:  Fall with sacral pain. EXAM: PELVIS - 1-2 VIEW COMPARISON:  None. FINDINGS: There is no evidence of pelvic fracture or diastasis. No pelvic bone lesions are seen. IMPRESSION: Negative. Electronically Signed   By: Misty Stanley M.D.   On: 05/18/2019 13:02   DG Sacrum/Coccyx  Result Date: 05/18/2019 CLINICAL DATA:  Fall.  Sacral pain. EXAM: SACRUM AND COCCYX - 2+ VIEW COMPARISON:  03/09/2019 FINDINGS: Stable since prior without definite evidence for acute  fracture although the marked osteopenia limits assessment. SI joints and symphysis pubis unremarkable. No evidence for pubic ramus  fracture. Degenerative disc disease noted L4-5 and L5-S1. IMPRESSION: No gross sacral fracture evident although marked osteopenia hinders assessment. Degenerative disc disease lower lumbar spine. Electronically Signed   By: Misty Stanley M.D.   On: 05/18/2019 13:01   CT Head Wo Contrast  Result Date: 05/18/2019 CLINICAL DATA:  Status post fall this morning. EXAM: CT HEAD WITHOUT CONTRAST CT CERVICAL SPINE WITHOUT CONTRAST TECHNIQUE: Multidetector CT imaging of the head and cervical spine was performed following the standard protocol without intravenous contrast. Multiplanar CT image reconstructions of the cervical spine were also generated. COMPARISON:  March 08, 2019 FINDINGS: CT HEAD FINDINGS Brain: No evidence of acute infarction, hemorrhage, hydrocephalus, extra-axial collection or mass lesion/mass effect. There is chronic diffuse atrophy. Chronic bilateral periventricular white matter small vessel ischemic changes identified. Vascular: No hyperdense vessel is noted. Skull: Normal. Negative for fracture or focal lesion. Sinuses/Orbits: No acute finding. Other: None. CT CERVICAL SPINE FINDINGS Alignment: Normal. Skull base and vertebrae: No acute fracture. No primary bone lesion or focal pathologic process. Soft tissues and spinal canal: No prevertebral fluid or swelling. No visible canal hematoma. Disc levels: Degenerative joint changes with narrowed joint space and osteophyte formation are noted. Upper chest: Negative. Other: None. IMPRESSION: 1. No focal acute intracranial abnormality identified. 2. No acute fracture or dislocation of cervical spine. Electronically Signed   By: Abelardo Diesel M.D.   On: 05/18/2019 12:56   CT Cervical Spine Wo Contrast  Result Date: 05/18/2019 CLINICAL DATA:  Status post fall this morning. EXAM: CT HEAD WITHOUT CONTRAST CT CERVICAL SPINE  WITHOUT CONTRAST TECHNIQUE: Multidetector CT imaging of the head and cervical spine was performed following the standard protocol without intravenous contrast. Multiplanar CT image reconstructions of the cervical spine were also generated. COMPARISON:  March 08, 2019 FINDINGS: CT HEAD FINDINGS Brain: No evidence of acute infarction, hemorrhage, hydrocephalus, extra-axial collection or mass lesion/mass effect. There is chronic diffuse atrophy. Chronic bilateral periventricular white matter small vessel ischemic changes identified. Vascular: No hyperdense vessel is noted. Skull: Normal. Negative for fracture or focal lesion. Sinuses/Orbits: No acute finding. Other: None. CT CERVICAL SPINE FINDINGS Alignment: Normal. Skull base and vertebrae: No acute fracture. No primary bone lesion or focal pathologic process. Soft tissues and spinal canal: No prevertebral fluid or swelling. No visible canal hematoma. Disc levels: Degenerative joint changes with narrowed joint space and osteophyte formation are noted. Upper chest: Negative. Other: None. IMPRESSION: 1. No focal acute intracranial abnormality identified. 2. No acute fracture or dislocation of cervical spine. Electronically Signed   By: Abelardo Diesel M.D.   On: 05/18/2019 12:56    Procedures Procedures (including critical care time)  Medications Ordered in ED Medications  acetaminophen (TYLENOL) tablet 1,000 mg (1,000 mg Oral Given 05/18/19 1302)    ED Course  I have reviewed the triage vital signs and the nursing notes.  Pertinent labs & imaging results that were available during my care of the patient were reviewed by me and considered in my medical decision making (see chart for details).    MDM Rules/Calculators/A&P                      85 yo F with a chief complaint of a fall.  Nonsyncopal by history.  Planing of head and tailbone pain.  Will obtain CT of the head and C-spine.  Plain film of the coccyx.  Reassess.  CT of the head and C-spine  are negative.  Plain films viewed by me without fracture.  Discharged home.  1:27 PM:  I have discussed the diagnosis/risks/treatment options with the patient and believe the pt to be eligible for discharge home to follow-up with PCP. We also discussed returning to the ED immediately if new or worsening sx occur. We discussed the sx which are most concerning (e.g., sudden worsening pain, fever, inability to tolerate by mouth) that necessitate immediate return. Medications administered to the patient during their visit and any new prescriptions provided to the patient are listed below.  Medications given during this visit Medications  acetaminophen (TYLENOL) tablet 1,000 mg (1,000 mg Oral Given 05/18/19 1302)     The patient appears reasonably screen and/or stabilized for discharge and I doubt any other medical condition or other [facility] requiring further screening, evaluation, or treatment in the ED at this time prior to discharge.   Final Clinical Impression(s) / ED Diagnoses Final diagnoses:  Fall, initial encounter    Rx / DC Orders ED Discharge Orders    None       Deno Etienne, DO 05/18/19 1327

## 2019-05-18 NOTE — ED Notes (Signed)
Pt transported back to [facility] via [facility]. Report called back to [place] at [facility]

## 2019-05-18 NOTE — ED Triage Notes (Signed)
Pt arrives [facility] from [facility] and rehab. Pt had an unwitnessed fall this am. Pt denies LOC, Pt reports mechanical fall. No blood thinners. Pt arrives in C-collar from EMS. Pt has no complaints of pain other than the c-collar that is in place. Pt is alert and oriented to baseline.

## 2019-05-20 ENCOUNTER — Emergency Department (HOSPITAL_COMMUNITY)
Admission: EM | Admit: 2019-05-20 | Discharge: 2019-05-20 | Disposition: A | Discharge status: Home / Self Care | Payer: Medicare Other | Attending: Emergency Medicine | Admitting: Emergency Medicine

## 2019-05-20 ENCOUNTER — Encounter (HOSPITAL_COMMUNITY): Payer: Self-pay

## 2019-05-20 ENCOUNTER — Emergency Department (HOSPITAL_COMMUNITY): Payer: Medicare Other

## 2019-05-20 ENCOUNTER — Other Ambulatory Visit: Payer: Self-pay

## 2019-05-20 DIAGNOSIS — I10 Essential (primary) hypertension: Secondary | ICD-10-CM | POA: Insufficient documentation

## 2019-05-20 DIAGNOSIS — Z91040 Latex allergy status: Secondary | ICD-10-CM | POA: Insufficient documentation

## 2019-05-20 DIAGNOSIS — W19XXXA Unspecified fall, initial encounter: Secondary | ICD-10-CM

## 2019-05-20 DIAGNOSIS — Z8673 Personal history of transient ischemic attack (TIA), and cerebral infarction without residual deficits: Secondary | ICD-10-CM | POA: Insufficient documentation

## 2019-05-20 DIAGNOSIS — Y939 Activity, unspecified: Secondary | ICD-10-CM | POA: Insufficient documentation

## 2019-05-20 DIAGNOSIS — R52 Pain, unspecified: Secondary | ICD-10-CM | POA: Diagnosis not present

## 2019-05-20 DIAGNOSIS — R404 Transient alteration of awareness: Secondary | ICD-10-CM | POA: Diagnosis not present

## 2019-05-20 DIAGNOSIS — F0390 Unspecified dementia without behavioral disturbance: Secondary | ICD-10-CM | POA: Diagnosis not present

## 2019-05-20 DIAGNOSIS — S0990XA Unspecified injury of head, initial encounter: Secondary | ICD-10-CM

## 2019-05-20 DIAGNOSIS — R279 Unspecified lack of coordination: Secondary | ICD-10-CM | POA: Diagnosis not present

## 2019-05-20 DIAGNOSIS — Y999 Unspecified external cause status: Secondary | ICD-10-CM | POA: Insufficient documentation

## 2019-05-20 DIAGNOSIS — M25562 Pain in left knee: Secondary | ICD-10-CM | POA: Diagnosis not present

## 2019-05-20 DIAGNOSIS — Y92121 Bathroom in nursing home as the place of occurrence of the external cause: Secondary | ICD-10-CM | POA: Insufficient documentation

## 2019-05-20 DIAGNOSIS — W010XXA Fall on same level from slipping, tripping and stumbling without subsequent striking against object, initial encounter: Secondary | ICD-10-CM | POA: Insufficient documentation

## 2019-05-20 DIAGNOSIS — I618 Other nontraumatic intracerebral hemorrhage: Secondary | ICD-10-CM | POA: Diagnosis not present

## 2019-05-20 DIAGNOSIS — Z79899 Other long term (current) drug therapy: Secondary | ICD-10-CM | POA: Diagnosis not present

## 2019-05-20 DIAGNOSIS — S0083XA Contusion of other part of head, initial encounter: Secondary | ICD-10-CM | POA: Insufficient documentation

## 2019-05-20 DIAGNOSIS — Z743 Need for continuous supervision: Secondary | ICD-10-CM | POA: Diagnosis not present

## 2019-05-20 DIAGNOSIS — R0902 Hypoxemia: Secondary | ICD-10-CM | POA: Diagnosis not present

## 2019-05-20 NOTE — ED Notes (Signed)
[facility] contacted and paperwork printed  

## 2019-05-20 NOTE — Discharge Instructions (Signed)
If you have any additional falls, any chest pain or difficulty breathing, abdominal pain, difficulty with walking, please return to ER for reassessment.  Given the 2 falls she had in the past week, please follow-up with your primary doctor for close recheck this week.

## 2019-05-20 NOTE — ED Triage Notes (Addendum)
Patient arrived via [facility] from [place] [facility]   C/o fall and hit back of head about 1 hour ago   Denies blood thinners Denies LOC  Patient has hematoma on back of head   A/O at baseline Patient can describe her fall   Hx. Dementia   C-collar placed by EMS    Paper work in bin   Patient states she would like her catheter looked at. She feels she messed it up when she fell.   Bp-158/72 P-100 RR-16 96% RA CBG-207

## 2019-05-20 NOTE — ED Notes (Signed)
ptar at bedside. 

## 2019-05-20 NOTE — ED Notes (Signed)
Patient given ice pack

## 2019-05-20 NOTE — ED Provider Notes (Signed)
[facility] DEPT Provider Note   CSN: [id number] Arrival date & time: 05/20/19  [id number]     History Chief Complaint  Patient presents with  . Fall    Beth Foster is a 85 y.o. female.  Presents to ER after fall.  Patient has reported history of dementia, cognitive decline however is able to provide fairly detailed history of events from today.  States that she was in her bathroom when she tripped over something, falling and hitting the back of her head.  She denies any loss of consciousness, denies trauma anywhere else specifically she has no neck pain, back pain, abdominal pain or chest pain.  States she was able to get back up off the floor without assistance and has been able to ambulate without any difficulty.  States that she was not have any symptoms earlier today and does not have any symptoms at this time.  HPI     Past Medical History:  Diagnosis Date  . Amaurosis fugax   . Anxiety   . Anxiety state, unspecified   . Bunion   . Cataracts, bilateral   . Sherran Needs syndrome   . Cognitive decline   . Contact dermatitis and other eczema due to other chemical products   . Edema   . External hemorrhoids without mention of complication   . GERD (gastroesophageal reflux disease)   . Glaucoma   . Hypertension   . Macular degeneration of both eyes   . Malignant neoplasm of breast (female), unspecified site dx'd 1996   rt breast; xrt   . Pain in joint, pelvic region and thigh   . Rash and other nonspecific skin eruption   . Stroke (cerebrum) ([place])    with decrease in [facility] of right hand.   . Stroke ([place])   . Unspecified cataract   . Unspecified late effects of cerebrovascular disease   . Viral hepatitis A without mention of hepatic coma   . Viral hepatitis A without mention of hepatic coma   . Vitamin D deficiency     Patient Active Problem List   Diagnosis Date Noted  . Vaginal candidiasis   . Prediabetes   . Recurrent UTI   . SOB  (shortness of breath)   . Acute lower UTI   . Leukopenia   . Hypoalbuminemia due to protein-calorie malnutrition ([place])   . Urinary retention   . Benign essential HTN   . Essential hypertension 03/12/2019  . Hyperlipidemia 03/12/2019  . UTI (urinary tract infection) 03/12/2019  . Enterococcus UTI   . Macular degeneration   . Cognitive deficit, post-stroke   . Pressure injury of skin 03/09/2019  . ICH (intracerebral hemorrhage) (HCC) w/ IVH, L cerebellar 03/08/2019  . Vascular dementia without behavioral disturbance ([place]) 02/28/2019  . Cerebrovascular disease 02/28/2019  . Visual field cut 02/28/2019  . Slow transit constipation 02/28/2019  . Rectal bleeding 02/28/2019  . Venous insufficiency 07/15/2016  . Visual hallucinations 07/15/2016  . OAB (overactive bladder) 07/15/2016  . Cellulitis 07/09/2016  . Cellulitis of right lower extremity 07/09/2016  . Neck pain 01/15/2016  . Left cervical lymphadenopathy 01/08/2016  . Cough 06/09/2015  . Dyspnea 06/09/2015  . Acute bronchitis 06/09/2015  . Allergic rhinitis 04/15/2015  . Syncope and collapse 01/10/2015  . Contusion of left supra orbit 01/10/2015  . Laceration of right eyebrow 01/10/2015  . DOE (dyspnea on exertion) 01/10/2015  . Exertional chest pain 01/10/2015  . Fatigue 01/10/2015  . Acute hyperglycemia 01/10/2015  .  Hemorrhoids 10/28/2013  . Vitamin D deficiency 10/28/2013  . Painful lumpy right breast 12/06/2012  . HLD (hyperlipidemia) 12/06/2012  . Essential hypertension, benign 12/06/2012  . Malignant neoplasm of breast (female), unspecified site   . Mild cognitive impairment with memory loss 08/20/2012  . Gait disorder 08/20/2012  . Wound, open, head 08/20/2012  . GERD (gastroesophageal reflux disease) 08/20/2012    Past Surgical History:  Procedure Laterality Date  . BREAST LUMPECTOMY  1995  . CARDIAC CATHETERIZATION  2004  . CHOLECYSTECTOMY  1972     OB History   No obstetric history on file.      Family History  Problem Relation Age of Onset  . Cancer Mother        lung  . Macular degeneration Brother   . Cancer Brother        brain  . Varicose Veins Father   . Varicose Veins Sister     Social History   Tobacco Use  . Smoking status: Never Smoker  . Smokeless tobacco: Never Used  Substance Use Topics  . Alcohol use: Never  . Drug use: Never    Home Medications Prior to Admission medications   Medication Sig Start Date End Date Taking? Authorizing Provider  acetaminophen (TYLENOL) 325 MG tablet Take 325 mg by mouth every 6 (six) hours as needed for mild pain or headache.    [provider]  atorvastatin (LIPITOR) 10 MG tablet Take 1 tablet (10 mg total) by mouth daily. 03/12/19 03/11/20  Donzetta Starch, NP  cholecalciferol (VITAMIN D) 1000 UNITS tablet Take 2,000 Units by mouth daily.     [provider]  diltiazem (CARDIZEM CD) 180 MG 24 hr capsule Take 1 capsule (180 mg total) by mouth at bedtime. 04/04/19   Love, Ivan Anchors, PA-C  estradiol (ESTRACE) 0.1 MG/GM vaginal cream Place 1 Applicatorful vaginally 3 (three) times a week. 10/05/18   Reed, Tiffany L, DO  feeding supplement, ENSURE ENLIVE, (ENSURE ENLIVE) LIQD Take 237 mLs by mouth 3 (three) times daily between meals. 04/04/19   Love, Ivan Anchors, PA-C  fluconazole (DIFLUCAN) 100 MG tablet Take 1 tablet (100 mg total) by mouth daily. 04/05/19   Love, Ivan Anchors, PA-C  latanoprost (XALATAN) 0.005 % ophthalmic solution Place 1 drop into both eyes at bedtime. 04/04/19   Love, Ivan Anchors, PA-C  lidocaine (LIDODERM) 5 % Place 1 patch onto the skin daily. Remove & Discard patch within 12 hours or as directed by MD 04/04/19   Love, Ivan Anchors, PA-C  Melatonin 3 MG TABS Take 0.5 tablets (1.5 mg total) by mouth at bedtime. 04/04/19   Love, Ivan Anchors, PA-C  Menthol-Methyl Salicylate (MUSCLE RUB) 10-15 % CREA Apply 1 application topically 4 (four) times daily -  before meals and at bedtime. 04/04/19   Love, Ivan Anchors,  PA-C  Multiple Vitamins-Minerals (EYE VITAMINS PO) Take 1 tablet by mouth daily.    [provider]  pantoprazole (PROTONIX) 40 MG tablet Take 1 tablet (40 mg total) by mouth daily. 04/05/19   Love, Ivan Anchors, PA-C  polyethylene glycol (MIRALAX / GLYCOLAX) 17 g packet Take 17 g by mouth daily. 04/05/19   Love, Ivan Anchors, PA-C  senna (SENOKOT) 8.6 MG TABS tablet Take 2 tablets (17.2 mg total) by mouth at bedtime. 04/04/19   Love, Ivan Anchors, PA-C  sodium phosphate (FLEET) 7-19 GM/118ML ENEM Place 133 mLs (1 enema total) rectally daily as needed for severe constipation. 04/04/19   Bary Leriche, PA-C  tamsulosin (FLOMAX) 0.4 MG CAPS capsule Take 2 capsules (0.8 mg total) by mouth daily after supper. 04/04/19   Love, Ivan Anchors, PA-C  witch hazel-glycerin (TUCKS) pad Apply topically as needed for itching. 03/14/19   Love, Ivan Anchors, PA-C    Allergies    Amoxicillin, Angiotensin receptor blockers, Beta adrenergic blockers, Cephalexin, Clindamycin/lincomycin, Hydrocortisone, Prednisone, Eggs or egg-derived products, Tape, Amoxicillin, Clindamycin/lincomycin, Corticosteroids, Hydrocortisone, Keflex [cephalexin], Latex, Latex, Other, and Reglan [metoclopramide]  Review of Systems   Review of Systems  Constitutional: Negative for chills and fever.  HENT: Negative for ear pain and sore throat.   Eyes: Negative for pain and visual disturbance.  Respiratory: Negative for cough and shortness of breath.   Cardiovascular: Negative for chest pain and palpitations.  Gastrointestinal: Negative for abdominal pain and vomiting.  Genitourinary: Negative for dysuria and hematuria.  Musculoskeletal: Negative for arthralgias and back pain.  Skin: Negative for color change and rash.  Neurological: Negative for seizures and syncope.  All other systems reviewed and are negative.    Physical Exam Updated Vital Signs BP (!) 164/86 (BP Location: Right Arm)   Pulse 100   Temp 98.7 F (37.1 C) (Oral)   Resp 16    SpO2 91%   Physical Exam Vitals and nursing note reviewed.  Constitutional:      General: She is not in acute distress.    Appearance: She is well-developed.  HENT:     Head: Normocephalic.     Comments: 2cm minimally raised hematoma to posterior occiput, no laceration Eyes:     Conjunctiva/sclera: Conjunctivae normal.  Cardiovascular:     Rate and Rhythm: Normal rate and regular rhythm.     Heart sounds: No murmur.  Pulmonary:     Effort: Pulmonary effort is normal. No respiratory distress.     Breath sounds: Normal breath sounds.  Abdominal:     Palpations: Abdomen is soft.     Tenderness: There is no abdominal tenderness.  Musculoskeletal:     Cervical back: Neck supple.  Skin:    General: Skin is warm and dry.     Capillary Refill: Capillary refill takes less than 2 seconds.  Neurological:     Mental Status: She is alert.     Comments: Alert, oriented to person, "hospital", year     ED Results / Procedures / Treatments   Labs (all labs ordered are listed, but only abnormal results are displayed) Labs Reviewed - No data to display  EKG None  Radiology CT Head Wo Contrast  Result Date: 05/20/2019 CLINICAL DATA:  Head trauma. EXAM: CT HEAD WITHOUT CONTRAST TECHNIQUE: Contiguous axial images were obtained from the base of the skull through the vertex without intravenous contrast. COMPARISON:  May 18, 2019 FINDINGS: Brain: No evidence of acute infarction, hemorrhage, hydrocephalus, extra-axial collection or mass lesion/mass effect. Mild age related atrophy is noted. There are mild to moderate chronic microvascular ischemic changes, similar to prior study. Vascular: No hyperdense vessel or unexpected calcification. Skull: Normal. Negative for fracture or focal lesion. Sinuses/Orbits: No acute finding. Other: None. IMPRESSION: No acute intracranial abnormality. Electronically Signed   By: Constance Holster M.D.   On: 05/20/2019 19:24    Procedures Procedures  (including critical care time)  Medications Ordered in ED Medications - No data to display  ED Course  I have reviewed the triage vital signs and the nursing notes.  Pertinent labs & imaging results that were available during my care of the patient were reviewed by me and considered  in my medical decision making (see chart for details).  Clinical Course as of May 19 1928  Mon May 20, 2019  1810 Complete initial assessment, will check CT head, discharge if normal   [RD]    Clinical Course User Index [RD] Lucrezia Starch, MD   MDM Rules/Calculators/A&P                       85 year old lady presents to ER after mechanical fall, concern for head trauma.  Here patient is noted to be very well-appearing, no appreciable trauma is noted on my head to toe examination.  With exception of mild hematoma on posterior occiput.  Patient has no acute complaints, had no complaints prior to fall.  CT head is negative.  Vital signs are normal.  Given this, do not feel emergent laboratory or other imaging needed.  She is ambulating without difficulty, will discharge back to facility.  Recommend close follow-up with her primary doctor.    After the discussed management above, the patient was determined to be safe for discharge.  The patient was in agreement with this plan and all questions regarding their care were answered.  ED return precautions were discussed and the patient will return to the ED with any significant worsening of condition.     Final Clinical Impression(s) / ED Diagnoses Final diagnoses:  Fall, initial encounter  Injury of head, initial encounter    Rx / DC Orders ED Discharge Orders    None       Lucrezia Starch, MD 05/20/19 [phone]

## 2019-05-21 DIAGNOSIS — U071 COVID-19: Secondary | ICD-10-CM | POA: Diagnosis not present

## 2019-05-22 DIAGNOSIS — S0990XA Unspecified injury of head, initial encounter: Secondary | ICD-10-CM | POA: Diagnosis not present

## 2019-05-22 DIAGNOSIS — R296 Repeated falls: Secondary | ICD-10-CM | POA: Diagnosis not present

## 2019-05-22 DIAGNOSIS — I618 Other nontraumatic intracerebral hemorrhage: Secondary | ICD-10-CM | POA: Diagnosis not present

## 2019-05-22 DIAGNOSIS — M25562 Pain in left knee: Secondary | ICD-10-CM | POA: Diagnosis not present

## 2019-05-28 ENCOUNTER — Telehealth: Payer: Self-pay | Admitting: *Deleted

## 2019-05-28 DIAGNOSIS — K5901 Slow transit constipation: Secondary | ICD-10-CM | POA: Diagnosis not present

## 2019-05-28 DIAGNOSIS — R338 Other retention of urine: Secondary | ICD-10-CM | POA: Diagnosis not present

## 2019-05-28 DIAGNOSIS — R51 Headache with orthostatic component, not elsewhere classified: Secondary | ICD-10-CM | POA: Diagnosis not present

## 2019-05-28 DIAGNOSIS — E440 Moderate protein-calorie malnutrition: Secondary | ICD-10-CM | POA: Diagnosis not present

## 2019-05-28 NOTE — Telephone Encounter (Signed)
Beth Curry, DO  May, Anita A, CMA  Tanique is to f/u here after her fall in the ED      [facility] for patient to [facility].

## 2019-05-31 DIAGNOSIS — N319 Neuromuscular dysfunction of bladder, unspecified: Secondary | ICD-10-CM | POA: Diagnosis not present

## 2019-06-03 DIAGNOSIS — Z79899 Other long term (current) drug therapy: Secondary | ICD-10-CM | POA: Diagnosis not present

## 2019-06-04 NOTE — Telephone Encounter (Signed)
Patient is in [place] has started care.

## 2019-06-07 DIAGNOSIS — N390 Urinary tract infection, site not specified: Secondary | ICD-10-CM | POA: Diagnosis not present

## 2019-06-11 ENCOUNTER — Ambulatory Visit: Payer: Medicare Other | Admitting: Podiatry

## 2019-06-11 DIAGNOSIS — M6281 Muscle weakness (generalized): Secondary | ICD-10-CM | POA: Diagnosis not present

## 2019-06-11 DIAGNOSIS — R2689 Other abnormalities of gait and mobility: Secondary | ICD-10-CM | POA: Diagnosis not present

## 2019-06-11 DIAGNOSIS — I613 Nontraumatic intracerebral hemorrhage in brain stem: Secondary | ICD-10-CM | POA: Diagnosis not present

## 2019-06-11 DIAGNOSIS — R278 Other lack of coordination: Secondary | ICD-10-CM | POA: Diagnosis not present

## 2019-06-11 DIAGNOSIS — R2681 Unsteadiness on feet: Secondary | ICD-10-CM | POA: Diagnosis not present

## 2019-06-11 DIAGNOSIS — R338 Other retention of urine: Secondary | ICD-10-CM | POA: Diagnosis not present

## 2019-06-11 DIAGNOSIS — N390 Urinary tract infection, site not specified: Secondary | ICD-10-CM | POA: Diagnosis not present

## 2019-06-12 DIAGNOSIS — R2681 Unsteadiness on feet: Secondary | ICD-10-CM | POA: Diagnosis not present

## 2019-06-12 DIAGNOSIS — N390 Urinary tract infection, site not specified: Secondary | ICD-10-CM | POA: Diagnosis not present

## 2019-06-12 DIAGNOSIS — I613 Nontraumatic intracerebral hemorrhage in brain stem: Secondary | ICD-10-CM | POA: Diagnosis not present

## 2019-06-12 DIAGNOSIS — Z79899 Other long term (current) drug therapy: Secondary | ICD-10-CM | POA: Diagnosis not present

## 2019-06-12 DIAGNOSIS — M6281 Muscle weakness (generalized): Secondary | ICD-10-CM | POA: Diagnosis not present

## 2019-06-12 DIAGNOSIS — R278 Other lack of coordination: Secondary | ICD-10-CM | POA: Diagnosis not present

## 2019-06-12 DIAGNOSIS — R2689 Other abnormalities of gait and mobility: Secondary | ICD-10-CM | POA: Diagnosis not present

## 2019-06-13 ENCOUNTER — Non-Acute Institutional Stay: Payer: Medicare Other | Admitting: Internal Medicine

## 2019-06-13 DIAGNOSIS — I613 Nontraumatic intracerebral hemorrhage in brain stem: Secondary | ICD-10-CM | POA: Diagnosis not present

## 2019-06-13 DIAGNOSIS — R278 Other lack of coordination: Secondary | ICD-10-CM | POA: Diagnosis not present

## 2019-06-13 DIAGNOSIS — R2681 Unsteadiness on feet: Secondary | ICD-10-CM | POA: Diagnosis not present

## 2019-06-13 DIAGNOSIS — M6281 Muscle weakness (generalized): Secondary | ICD-10-CM | POA: Diagnosis not present

## 2019-06-13 DIAGNOSIS — R2689 Other abnormalities of gait and mobility: Secondary | ICD-10-CM | POA: Diagnosis not present

## 2019-06-14 ENCOUNTER — Other Ambulatory Visit: Payer: Self-pay

## 2019-06-14 DIAGNOSIS — I613 Nontraumatic intracerebral hemorrhage in brain stem: Secondary | ICD-10-CM | POA: Diagnosis not present

## 2019-06-14 DIAGNOSIS — R634 Abnormal weight loss: Secondary | ICD-10-CM | POA: Diagnosis not present

## 2019-06-14 DIAGNOSIS — R2681 Unsteadiness on feet: Secondary | ICD-10-CM | POA: Diagnosis not present

## 2019-06-14 DIAGNOSIS — R278 Other lack of coordination: Secondary | ICD-10-CM | POA: Diagnosis not present

## 2019-06-14 DIAGNOSIS — M6281 Muscle weakness (generalized): Secondary | ICD-10-CM | POA: Diagnosis not present

## 2019-06-14 DIAGNOSIS — R2689 Other abnormalities of gait and mobility: Secondary | ICD-10-CM | POA: Diagnosis not present

## 2019-06-15 DIAGNOSIS — R2689 Other abnormalities of gait and mobility: Secondary | ICD-10-CM | POA: Diagnosis not present

## 2019-06-15 DIAGNOSIS — R278 Other lack of coordination: Secondary | ICD-10-CM | POA: Diagnosis not present

## 2019-06-15 DIAGNOSIS — R2681 Unsteadiness on feet: Secondary | ICD-10-CM | POA: Diagnosis not present

## 2019-06-15 DIAGNOSIS — I613 Nontraumatic intracerebral hemorrhage in brain stem: Secondary | ICD-10-CM | POA: Diagnosis not present

## 2019-06-15 DIAGNOSIS — M6281 Muscle weakness (generalized): Secondary | ICD-10-CM | POA: Diagnosis not present

## 2019-06-17 DIAGNOSIS — Z1612 Extended spectrum beta lactamase (ESBL) resistance: Secondary | ICD-10-CM | POA: Diagnosis not present

## 2019-06-17 DIAGNOSIS — R278 Other lack of coordination: Secondary | ICD-10-CM | POA: Diagnosis not present

## 2019-06-17 DIAGNOSIS — A498 Other bacterial infections of unspecified site: Secondary | ICD-10-CM | POA: Diagnosis not present

## 2019-06-17 DIAGNOSIS — R2689 Other abnormalities of gait and mobility: Secondary | ICD-10-CM | POA: Diagnosis not present

## 2019-06-17 DIAGNOSIS — R2681 Unsteadiness on feet: Secondary | ICD-10-CM | POA: Diagnosis not present

## 2019-06-17 DIAGNOSIS — I613 Nontraumatic intracerebral hemorrhage in brain stem: Secondary | ICD-10-CM | POA: Diagnosis not present

## 2019-06-17 DIAGNOSIS — R338 Other retention of urine: Secondary | ICD-10-CM | POA: Diagnosis not present

## 2019-06-17 DIAGNOSIS — M6281 Muscle weakness (generalized): Secondary | ICD-10-CM | POA: Diagnosis not present

## 2019-06-18 DIAGNOSIS — R2689 Other abnormalities of gait and mobility: Secondary | ICD-10-CM | POA: Diagnosis not present

## 2019-06-18 DIAGNOSIS — M6281 Muscle weakness (generalized): Secondary | ICD-10-CM | POA: Diagnosis not present

## 2019-06-18 DIAGNOSIS — R2681 Unsteadiness on feet: Secondary | ICD-10-CM | POA: Diagnosis not present

## 2019-06-18 DIAGNOSIS — I613 Nontraumatic intracerebral hemorrhage in brain stem: Secondary | ICD-10-CM | POA: Diagnosis not present

## 2019-06-18 DIAGNOSIS — R278 Other lack of coordination: Secondary | ICD-10-CM | POA: Diagnosis not present

## 2019-06-19 DIAGNOSIS — Z1612 Extended spectrum beta lactamase (ESBL) resistance: Secondary | ICD-10-CM | POA: Diagnosis not present

## 2019-06-19 DIAGNOSIS — R338 Other retention of urine: Secondary | ICD-10-CM | POA: Diagnosis not present

## 2019-06-19 DIAGNOSIS — T83091A Other mechanical complication of indwelling urethral catheter, initial encounter: Secondary | ICD-10-CM | POA: Diagnosis not present

## 2019-06-19 DIAGNOSIS — N390 Urinary tract infection, site not specified: Secondary | ICD-10-CM | POA: Diagnosis not present

## 2019-06-19 DIAGNOSIS — R5081 Fever presenting with conditions classified elsewhere: Secondary | ICD-10-CM | POA: Diagnosis not present

## 2019-06-20 DIAGNOSIS — R2689 Other abnormalities of gait and mobility: Secondary | ICD-10-CM | POA: Diagnosis not present

## 2019-06-20 DIAGNOSIS — R2681 Unsteadiness on feet: Secondary | ICD-10-CM | POA: Diagnosis not present

## 2019-06-20 DIAGNOSIS — R278 Other lack of coordination: Secondary | ICD-10-CM | POA: Diagnosis not present

## 2019-06-20 DIAGNOSIS — M6281 Muscle weakness (generalized): Secondary | ICD-10-CM | POA: Diagnosis not present

## 2019-06-20 DIAGNOSIS — I613 Nontraumatic intracerebral hemorrhage in brain stem: Secondary | ICD-10-CM | POA: Diagnosis not present

## 2019-06-21 DIAGNOSIS — B9689 Other specified bacterial agents as the cause of diseases classified elsewhere: Secondary | ICD-10-CM | POA: Diagnosis not present

## 2019-06-21 DIAGNOSIS — M6281 Muscle weakness (generalized): Secondary | ICD-10-CM | POA: Diagnosis not present

## 2019-06-21 DIAGNOSIS — R2689 Other abnormalities of gait and mobility: Secondary | ICD-10-CM | POA: Diagnosis not present

## 2019-06-21 DIAGNOSIS — N390 Urinary tract infection, site not specified: Secondary | ICD-10-CM | POA: Diagnosis not present

## 2019-06-21 DIAGNOSIS — R2681 Unsteadiness on feet: Secondary | ICD-10-CM | POA: Diagnosis not present

## 2019-06-21 DIAGNOSIS — I613 Nontraumatic intracerebral hemorrhage in brain stem: Secondary | ICD-10-CM | POA: Diagnosis not present

## 2019-06-21 DIAGNOSIS — R278 Other lack of coordination: Secondary | ICD-10-CM | POA: Diagnosis not present

## 2019-06-22 DIAGNOSIS — R278 Other lack of coordination: Secondary | ICD-10-CM | POA: Diagnosis not present

## 2019-06-22 DIAGNOSIS — R2681 Unsteadiness on feet: Secondary | ICD-10-CM | POA: Diagnosis not present

## 2019-06-22 DIAGNOSIS — M6281 Muscle weakness (generalized): Secondary | ICD-10-CM | POA: Diagnosis not present

## 2019-06-22 DIAGNOSIS — I613 Nontraumatic intracerebral hemorrhage in brain stem: Secondary | ICD-10-CM | POA: Diagnosis not present

## 2019-06-22 DIAGNOSIS — R2689 Other abnormalities of gait and mobility: Secondary | ICD-10-CM | POA: Diagnosis not present

## 2019-06-23 DIAGNOSIS — I613 Nontraumatic intracerebral hemorrhage in brain stem: Secondary | ICD-10-CM | POA: Diagnosis not present

## 2019-06-23 DIAGNOSIS — R2681 Unsteadiness on feet: Secondary | ICD-10-CM | POA: Diagnosis not present

## 2019-06-23 DIAGNOSIS — M6281 Muscle weakness (generalized): Secondary | ICD-10-CM | POA: Diagnosis not present

## 2019-06-23 DIAGNOSIS — R2689 Other abnormalities of gait and mobility: Secondary | ICD-10-CM | POA: Diagnosis not present

## 2019-06-23 DIAGNOSIS — R278 Other lack of coordination: Secondary | ICD-10-CM | POA: Diagnosis not present

## 2019-06-24 DIAGNOSIS — R2681 Unsteadiness on feet: Secondary | ICD-10-CM | POA: Diagnosis not present

## 2019-06-24 DIAGNOSIS — M6281 Muscle weakness (generalized): Secondary | ICD-10-CM | POA: Diagnosis not present

## 2019-06-24 DIAGNOSIS — I613 Nontraumatic intracerebral hemorrhage in brain stem: Secondary | ICD-10-CM | POA: Diagnosis not present

## 2019-06-24 DIAGNOSIS — R278 Other lack of coordination: Secondary | ICD-10-CM | POA: Diagnosis not present

## 2019-06-24 DIAGNOSIS — R2689 Other abnormalities of gait and mobility: Secondary | ICD-10-CM | POA: Diagnosis not present

## 2019-06-25 ENCOUNTER — Non-Acute Institutional Stay: Payer: Medicare Other | Admitting: Internal Medicine

## 2019-06-25 DIAGNOSIS — I6389 Other cerebral infarction: Secondary | ICD-10-CM | POA: Diagnosis not present

## 2019-06-25 DIAGNOSIS — N390 Urinary tract infection, site not specified: Secondary | ICD-10-CM | POA: Diagnosis not present

## 2019-06-25 DIAGNOSIS — R278 Other lack of coordination: Secondary | ICD-10-CM | POA: Diagnosis not present

## 2019-06-25 DIAGNOSIS — R2681 Unsteadiness on feet: Secondary | ICD-10-CM | POA: Diagnosis not present

## 2019-06-25 DIAGNOSIS — M6281 Muscle weakness (generalized): Secondary | ICD-10-CM | POA: Diagnosis not present

## 2019-06-25 DIAGNOSIS — I10 Essential (primary) hypertension: Secondary | ICD-10-CM | POA: Diagnosis not present

## 2019-06-25 DIAGNOSIS — R338 Other retention of urine: Secondary | ICD-10-CM | POA: Diagnosis not present

## 2019-06-25 DIAGNOSIS — I613 Nontraumatic intracerebral hemorrhage in brain stem: Secondary | ICD-10-CM | POA: Diagnosis not present

## 2019-06-25 DIAGNOSIS — R2689 Other abnormalities of gait and mobility: Secondary | ICD-10-CM | POA: Diagnosis not present

## 2019-06-26 ENCOUNTER — Other Ambulatory Visit: Payer: Self-pay

## 2019-06-26 DIAGNOSIS — R2681 Unsteadiness on feet: Secondary | ICD-10-CM | POA: Diagnosis not present

## 2019-06-26 DIAGNOSIS — M6281 Muscle weakness (generalized): Secondary | ICD-10-CM | POA: Diagnosis not present

## 2019-06-26 DIAGNOSIS — I613 Nontraumatic intracerebral hemorrhage in brain stem: Secondary | ICD-10-CM | POA: Diagnosis not present

## 2019-06-26 DIAGNOSIS — Z1612 Extended spectrum beta lactamase (ESBL) resistance: Secondary | ICD-10-CM | POA: Diagnosis not present

## 2019-06-26 DIAGNOSIS — R2689 Other abnormalities of gait and mobility: Secondary | ICD-10-CM | POA: Diagnosis not present

## 2019-06-26 DIAGNOSIS — R278 Other lack of coordination: Secondary | ICD-10-CM | POA: Diagnosis not present

## 2019-06-26 DIAGNOSIS — Z515 Encounter for palliative care: Secondary | ICD-10-CM | POA: Diagnosis not present

## 2019-06-26 DIAGNOSIS — A498 Other bacterial infections of unspecified site: Secondary | ICD-10-CM | POA: Diagnosis not present

## 2019-06-27 DIAGNOSIS — M6281 Muscle weakness (generalized): Secondary | ICD-10-CM | POA: Diagnosis not present

## 2019-06-27 DIAGNOSIS — R2681 Unsteadiness on feet: Secondary | ICD-10-CM | POA: Diagnosis not present

## 2019-06-27 DIAGNOSIS — I613 Nontraumatic intracerebral hemorrhage in brain stem: Secondary | ICD-10-CM | POA: Diagnosis not present

## 2019-06-27 DIAGNOSIS — R278 Other lack of coordination: Secondary | ICD-10-CM | POA: Diagnosis not present

## 2019-06-27 DIAGNOSIS — R2689 Other abnormalities of gait and mobility: Secondary | ICD-10-CM | POA: Diagnosis not present

## 2019-06-28 DIAGNOSIS — R2689 Other abnormalities of gait and mobility: Secondary | ICD-10-CM | POA: Diagnosis not present

## 2019-06-28 DIAGNOSIS — M6281 Muscle weakness (generalized): Secondary | ICD-10-CM | POA: Diagnosis not present

## 2019-06-28 DIAGNOSIS — I613 Nontraumatic intracerebral hemorrhage in brain stem: Secondary | ICD-10-CM | POA: Diagnosis not present

## 2019-06-28 DIAGNOSIS — R2681 Unsteadiness on feet: Secondary | ICD-10-CM | POA: Diagnosis not present

## 2019-06-28 DIAGNOSIS — N319 Neuromuscular dysfunction of bladder, unspecified: Secondary | ICD-10-CM | POA: Diagnosis not present

## 2019-06-28 DIAGNOSIS — R278 Other lack of coordination: Secondary | ICD-10-CM | POA: Diagnosis not present

## 2019-06-29 DIAGNOSIS — R278 Other lack of coordination: Secondary | ICD-10-CM | POA: Diagnosis not present

## 2019-06-29 DIAGNOSIS — R2681 Unsteadiness on feet: Secondary | ICD-10-CM | POA: Diagnosis not present

## 2019-06-29 DIAGNOSIS — I613 Nontraumatic intracerebral hemorrhage in brain stem: Secondary | ICD-10-CM | POA: Diagnosis not present

## 2019-06-29 DIAGNOSIS — R2689 Other abnormalities of gait and mobility: Secondary | ICD-10-CM | POA: Diagnosis not present

## 2019-06-29 DIAGNOSIS — M6281 Muscle weakness (generalized): Secondary | ICD-10-CM | POA: Diagnosis not present

## 2019-07-01 DIAGNOSIS — R2681 Unsteadiness on feet: Secondary | ICD-10-CM | POA: Diagnosis not present

## 2019-07-01 DIAGNOSIS — M6281 Muscle weakness (generalized): Secondary | ICD-10-CM | POA: Diagnosis not present

## 2019-07-01 DIAGNOSIS — I613 Nontraumatic intracerebral hemorrhage in brain stem: Secondary | ICD-10-CM | POA: Diagnosis not present

## 2019-07-01 DIAGNOSIS — R2689 Other abnormalities of gait and mobility: Secondary | ICD-10-CM | POA: Diagnosis not present

## 2019-07-01 DIAGNOSIS — R278 Other lack of coordination: Secondary | ICD-10-CM | POA: Diagnosis not present

## 2019-07-02 DIAGNOSIS — M6281 Muscle weakness (generalized): Secondary | ICD-10-CM | POA: Diagnosis not present

## 2019-07-02 DIAGNOSIS — R2689 Other abnormalities of gait and mobility: Secondary | ICD-10-CM | POA: Diagnosis not present

## 2019-07-02 DIAGNOSIS — R2681 Unsteadiness on feet: Secondary | ICD-10-CM | POA: Diagnosis not present

## 2019-07-02 DIAGNOSIS — I613 Nontraumatic intracerebral hemorrhage in brain stem: Secondary | ICD-10-CM | POA: Diagnosis not present

## 2019-07-02 DIAGNOSIS — R278 Other lack of coordination: Secondary | ICD-10-CM | POA: Diagnosis not present

## 2019-07-03 DIAGNOSIS — R278 Other lack of coordination: Secondary | ICD-10-CM | POA: Diagnosis not present

## 2019-07-03 DIAGNOSIS — R2681 Unsteadiness on feet: Secondary | ICD-10-CM | POA: Diagnosis not present

## 2019-07-03 DIAGNOSIS — M6281 Muscle weakness (generalized): Secondary | ICD-10-CM | POA: Diagnosis not present

## 2019-07-03 DIAGNOSIS — R2689 Other abnormalities of gait and mobility: Secondary | ICD-10-CM | POA: Diagnosis not present

## 2019-07-03 DIAGNOSIS — I613 Nontraumatic intracerebral hemorrhage in brain stem: Secondary | ICD-10-CM | POA: Diagnosis not present

## 2019-07-04 DIAGNOSIS — I613 Nontraumatic intracerebral hemorrhage in brain stem: Secondary | ICD-10-CM | POA: Diagnosis not present

## 2019-07-04 DIAGNOSIS — N390 Urinary tract infection, site not specified: Secondary | ICD-10-CM | POA: Diagnosis not present

## 2019-07-04 DIAGNOSIS — R278 Other lack of coordination: Secondary | ICD-10-CM | POA: Diagnosis not present

## 2019-07-04 DIAGNOSIS — R2689 Other abnormalities of gait and mobility: Secondary | ICD-10-CM | POA: Diagnosis not present

## 2019-07-04 DIAGNOSIS — R2681 Unsteadiness on feet: Secondary | ICD-10-CM | POA: Diagnosis not present

## 2019-07-04 DIAGNOSIS — M6281 Muscle weakness (generalized): Secondary | ICD-10-CM | POA: Diagnosis not present

## 2019-07-05 DIAGNOSIS — N390 Urinary tract infection, site not specified: Secondary | ICD-10-CM | POA: Diagnosis not present

## 2019-07-05 DIAGNOSIS — R338 Other retention of urine: Secondary | ICD-10-CM | POA: Diagnosis not present

## 2019-07-05 DIAGNOSIS — M6281 Muscle weakness (generalized): Secondary | ICD-10-CM | POA: Diagnosis not present

## 2019-07-05 DIAGNOSIS — R2689 Other abnormalities of gait and mobility: Secondary | ICD-10-CM | POA: Diagnosis not present

## 2019-07-05 DIAGNOSIS — Z1612 Extended spectrum beta lactamase (ESBL) resistance: Secondary | ICD-10-CM | POA: Diagnosis not present

## 2019-07-05 DIAGNOSIS — R278 Other lack of coordination: Secondary | ICD-10-CM | POA: Diagnosis not present

## 2019-07-05 DIAGNOSIS — R2681 Unsteadiness on feet: Secondary | ICD-10-CM | POA: Diagnosis not present

## 2019-07-05 DIAGNOSIS — I613 Nontraumatic intracerebral hemorrhage in brain stem: Secondary | ICD-10-CM | POA: Diagnosis not present

## 2019-07-06 DIAGNOSIS — R278 Other lack of coordination: Secondary | ICD-10-CM | POA: Diagnosis not present

## 2019-07-06 DIAGNOSIS — I613 Nontraumatic intracerebral hemorrhage in brain stem: Secondary | ICD-10-CM | POA: Diagnosis not present

## 2019-07-06 DIAGNOSIS — R2681 Unsteadiness on feet: Secondary | ICD-10-CM | POA: Diagnosis not present

## 2019-07-06 DIAGNOSIS — R2689 Other abnormalities of gait and mobility: Secondary | ICD-10-CM | POA: Diagnosis not present

## 2019-07-06 DIAGNOSIS — M6281 Muscle weakness (generalized): Secondary | ICD-10-CM | POA: Diagnosis not present

## 2019-07-08 DIAGNOSIS — R278 Other lack of coordination: Secondary | ICD-10-CM | POA: Diagnosis not present

## 2019-07-08 DIAGNOSIS — R2681 Unsteadiness on feet: Secondary | ICD-10-CM | POA: Diagnosis not present

## 2019-07-08 DIAGNOSIS — I613 Nontraumatic intracerebral hemorrhage in brain stem: Secondary | ICD-10-CM | POA: Diagnosis not present

## 2019-07-08 DIAGNOSIS — R2689 Other abnormalities of gait and mobility: Secondary | ICD-10-CM | POA: Diagnosis not present

## 2019-07-08 DIAGNOSIS — M6281 Muscle weakness (generalized): Secondary | ICD-10-CM | POA: Diagnosis not present

## 2019-07-09 DIAGNOSIS — R2681 Unsteadiness on feet: Secondary | ICD-10-CM | POA: Diagnosis not present

## 2019-07-09 DIAGNOSIS — R278 Other lack of coordination: Secondary | ICD-10-CM | POA: Diagnosis not present

## 2019-07-09 DIAGNOSIS — I613 Nontraumatic intracerebral hemorrhage in brain stem: Secondary | ICD-10-CM | POA: Diagnosis not present

## 2019-07-09 DIAGNOSIS — N390 Urinary tract infection, site not specified: Secondary | ICD-10-CM | POA: Diagnosis not present

## 2019-07-09 DIAGNOSIS — M6281 Muscle weakness (generalized): Secondary | ICD-10-CM | POA: Diagnosis not present

## 2019-07-09 DIAGNOSIS — R2689 Other abnormalities of gait and mobility: Secondary | ICD-10-CM | POA: Diagnosis not present

## 2019-07-10 DIAGNOSIS — R2681 Unsteadiness on feet: Secondary | ICD-10-CM | POA: Diagnosis not present

## 2019-07-10 DIAGNOSIS — I613 Nontraumatic intracerebral hemorrhage in brain stem: Secondary | ICD-10-CM | POA: Diagnosis not present

## 2019-07-10 DIAGNOSIS — R278 Other lack of coordination: Secondary | ICD-10-CM | POA: Diagnosis not present

## 2019-07-10 DIAGNOSIS — R2689 Other abnormalities of gait and mobility: Secondary | ICD-10-CM | POA: Diagnosis not present

## 2019-07-10 DIAGNOSIS — M6281 Muscle weakness (generalized): Secondary | ICD-10-CM | POA: Diagnosis not present

## 2019-07-11 DIAGNOSIS — R2689 Other abnormalities of gait and mobility: Secondary | ICD-10-CM | POA: Diagnosis not present

## 2019-07-11 DIAGNOSIS — R2681 Unsteadiness on feet: Secondary | ICD-10-CM | POA: Diagnosis not present

## 2019-07-11 DIAGNOSIS — R278 Other lack of coordination: Secondary | ICD-10-CM | POA: Diagnosis not present

## 2019-07-11 DIAGNOSIS — I613 Nontraumatic intracerebral hemorrhage in brain stem: Secondary | ICD-10-CM | POA: Diagnosis not present

## 2019-07-11 DIAGNOSIS — M6281 Muscle weakness (generalized): Secondary | ICD-10-CM | POA: Diagnosis not present

## 2019-07-12 DIAGNOSIS — R278 Other lack of coordination: Secondary | ICD-10-CM | POA: Diagnosis not present

## 2019-07-12 DIAGNOSIS — R2681 Unsteadiness on feet: Secondary | ICD-10-CM | POA: Diagnosis not present

## 2019-07-12 DIAGNOSIS — M6281 Muscle weakness (generalized): Secondary | ICD-10-CM | POA: Diagnosis not present

## 2019-07-12 DIAGNOSIS — R2689 Other abnormalities of gait and mobility: Secondary | ICD-10-CM | POA: Diagnosis not present

## 2019-07-12 DIAGNOSIS — I613 Nontraumatic intracerebral hemorrhage in brain stem: Secondary | ICD-10-CM | POA: Diagnosis not present

## 2019-07-13 DIAGNOSIS — R278 Other lack of coordination: Secondary | ICD-10-CM | POA: Diagnosis not present

## 2019-07-13 DIAGNOSIS — I613 Nontraumatic intracerebral hemorrhage in brain stem: Secondary | ICD-10-CM | POA: Diagnosis not present

## 2019-07-13 DIAGNOSIS — R2689 Other abnormalities of gait and mobility: Secondary | ICD-10-CM | POA: Diagnosis not present

## 2019-07-13 DIAGNOSIS — M6281 Muscle weakness (generalized): Secondary | ICD-10-CM | POA: Diagnosis not present

## 2019-07-13 DIAGNOSIS — R2681 Unsteadiness on feet: Secondary | ICD-10-CM | POA: Diagnosis not present

## 2019-07-15 DIAGNOSIS — I613 Nontraumatic intracerebral hemorrhage in brain stem: Secondary | ICD-10-CM | POA: Diagnosis not present

## 2019-07-15 DIAGNOSIS — N390 Urinary tract infection, site not specified: Secondary | ICD-10-CM | POA: Diagnosis not present

## 2019-07-15 DIAGNOSIS — Z8619 Personal history of other infectious and parasitic diseases: Secondary | ICD-10-CM | POA: Diagnosis not present

## 2019-07-15 DIAGNOSIS — R2681 Unsteadiness on feet: Secondary | ICD-10-CM | POA: Diagnosis not present

## 2019-07-15 DIAGNOSIS — M6281 Muscle weakness (generalized): Secondary | ICD-10-CM | POA: Diagnosis not present

## 2019-07-15 DIAGNOSIS — R278 Other lack of coordination: Secondary | ICD-10-CM | POA: Diagnosis not present

## 2019-07-15 DIAGNOSIS — R2689 Other abnormalities of gait and mobility: Secondary | ICD-10-CM | POA: Diagnosis not present

## 2019-07-16 DIAGNOSIS — R278 Other lack of coordination: Secondary | ICD-10-CM | POA: Diagnosis not present

## 2019-07-16 DIAGNOSIS — R2681 Unsteadiness on feet: Secondary | ICD-10-CM | POA: Diagnosis not present

## 2019-07-16 DIAGNOSIS — R2689 Other abnormalities of gait and mobility: Secondary | ICD-10-CM | POA: Diagnosis not present

## 2019-07-16 DIAGNOSIS — M6281 Muscle weakness (generalized): Secondary | ICD-10-CM | POA: Diagnosis not present

## 2019-07-16 DIAGNOSIS — I613 Nontraumatic intracerebral hemorrhage in brain stem: Secondary | ICD-10-CM | POA: Diagnosis not present

## 2019-07-17 DIAGNOSIS — R278 Other lack of coordination: Secondary | ICD-10-CM | POA: Diagnosis not present

## 2019-07-17 DIAGNOSIS — R2681 Unsteadiness on feet: Secondary | ICD-10-CM | POA: Diagnosis not present

## 2019-07-17 DIAGNOSIS — R2689 Other abnormalities of gait and mobility: Secondary | ICD-10-CM | POA: Diagnosis not present

## 2019-07-17 DIAGNOSIS — I613 Nontraumatic intracerebral hemorrhage in brain stem: Secondary | ICD-10-CM | POA: Diagnosis not present

## 2019-07-17 DIAGNOSIS — M6281 Muscle weakness (generalized): Secondary | ICD-10-CM | POA: Diagnosis not present

## 2019-07-18 DIAGNOSIS — R278 Other lack of coordination: Secondary | ICD-10-CM | POA: Diagnosis not present

## 2019-07-18 DIAGNOSIS — R2689 Other abnormalities of gait and mobility: Secondary | ICD-10-CM | POA: Diagnosis not present

## 2019-07-18 DIAGNOSIS — I613 Nontraumatic intracerebral hemorrhage in brain stem: Secondary | ICD-10-CM | POA: Diagnosis not present

## 2019-07-18 DIAGNOSIS — M6281 Muscle weakness (generalized): Secondary | ICD-10-CM | POA: Diagnosis not present

## 2019-07-18 DIAGNOSIS — R2681 Unsteadiness on feet: Secondary | ICD-10-CM | POA: Diagnosis not present

## 2019-07-19 DIAGNOSIS — R2689 Other abnormalities of gait and mobility: Secondary | ICD-10-CM | POA: Diagnosis not present

## 2019-07-19 DIAGNOSIS — R2681 Unsteadiness on feet: Secondary | ICD-10-CM | POA: Diagnosis not present

## 2019-07-19 DIAGNOSIS — M6281 Muscle weakness (generalized): Secondary | ICD-10-CM | POA: Diagnosis not present

## 2019-07-19 DIAGNOSIS — I613 Nontraumatic intracerebral hemorrhage in brain stem: Secondary | ICD-10-CM | POA: Diagnosis not present

## 2019-07-19 DIAGNOSIS — R278 Other lack of coordination: Secondary | ICD-10-CM | POA: Diagnosis not present

## 2019-07-21 DIAGNOSIS — R278 Other lack of coordination: Secondary | ICD-10-CM | POA: Diagnosis not present

## 2019-07-21 DIAGNOSIS — R2689 Other abnormalities of gait and mobility: Secondary | ICD-10-CM | POA: Diagnosis not present

## 2019-07-21 DIAGNOSIS — I613 Nontraumatic intracerebral hemorrhage in brain stem: Secondary | ICD-10-CM | POA: Diagnosis not present

## 2019-07-21 DIAGNOSIS — R2681 Unsteadiness on feet: Secondary | ICD-10-CM | POA: Diagnosis not present

## 2019-07-21 DIAGNOSIS — M6281 Muscle weakness (generalized): Secondary | ICD-10-CM | POA: Diagnosis not present

## 2019-07-22 DIAGNOSIS — R2681 Unsteadiness on feet: Secondary | ICD-10-CM | POA: Diagnosis not present

## 2019-07-22 DIAGNOSIS — I613 Nontraumatic intracerebral hemorrhage in brain stem: Secondary | ICD-10-CM | POA: Diagnosis not present

## 2019-07-22 DIAGNOSIS — M6281 Muscle weakness (generalized): Secondary | ICD-10-CM | POA: Diagnosis not present

## 2019-07-22 DIAGNOSIS — R2689 Other abnormalities of gait and mobility: Secondary | ICD-10-CM | POA: Diagnosis not present

## 2019-07-22 DIAGNOSIS — R278 Other lack of coordination: Secondary | ICD-10-CM | POA: Diagnosis not present

## 2019-07-23 DIAGNOSIS — R278 Other lack of coordination: Secondary | ICD-10-CM | POA: Diagnosis not present

## 2019-07-23 DIAGNOSIS — I69319 Unspecified symptoms and signs involving cognitive functions following cerebral infarction: Secondary | ICD-10-CM | POA: Diagnosis not present

## 2019-07-23 DIAGNOSIS — R2689 Other abnormalities of gait and mobility: Secondary | ICD-10-CM | POA: Diagnosis not present

## 2019-07-23 DIAGNOSIS — I613 Nontraumatic intracerebral hemorrhage in brain stem: Secondary | ICD-10-CM | POA: Diagnosis not present

## 2019-07-23 DIAGNOSIS — Z9181 History of falling: Secondary | ICD-10-CM | POA: Diagnosis not present

## 2019-07-23 DIAGNOSIS — M6281 Muscle weakness (generalized): Secondary | ICD-10-CM | POA: Diagnosis not present

## 2019-07-23 DIAGNOSIS — R2681 Unsteadiness on feet: Secondary | ICD-10-CM | POA: Diagnosis not present

## 2019-07-24 DIAGNOSIS — R2689 Other abnormalities of gait and mobility: Secondary | ICD-10-CM | POA: Diagnosis not present

## 2019-07-24 DIAGNOSIS — R278 Other lack of coordination: Secondary | ICD-10-CM | POA: Diagnosis not present

## 2019-07-24 DIAGNOSIS — I613 Nontraumatic intracerebral hemorrhage in brain stem: Secondary | ICD-10-CM | POA: Diagnosis not present

## 2019-07-24 DIAGNOSIS — R2681 Unsteadiness on feet: Secondary | ICD-10-CM | POA: Diagnosis not present

## 2019-07-24 DIAGNOSIS — M6281 Muscle weakness (generalized): Secondary | ICD-10-CM | POA: Diagnosis not present

## 2019-07-25 DIAGNOSIS — R2681 Unsteadiness on feet: Secondary | ICD-10-CM | POA: Diagnosis not present

## 2019-07-25 DIAGNOSIS — R2689 Other abnormalities of gait and mobility: Secondary | ICD-10-CM | POA: Diagnosis not present

## 2019-07-25 DIAGNOSIS — L602 Onychogryphosis: Secondary | ICD-10-CM | POA: Diagnosis not present

## 2019-07-25 DIAGNOSIS — R278 Other lack of coordination: Secondary | ICD-10-CM | POA: Diagnosis not present

## 2019-07-25 DIAGNOSIS — M21611 Bunion of right foot: Secondary | ICD-10-CM | POA: Diagnosis not present

## 2019-07-25 DIAGNOSIS — M21612 Bunion of left foot: Secondary | ICD-10-CM | POA: Diagnosis not present

## 2019-07-25 DIAGNOSIS — M6281 Muscle weakness (generalized): Secondary | ICD-10-CM | POA: Diagnosis not present

## 2019-07-25 DIAGNOSIS — I613 Nontraumatic intracerebral hemorrhage in brain stem: Secondary | ICD-10-CM | POA: Diagnosis not present

## 2019-07-26 DIAGNOSIS — R278 Other lack of coordination: Secondary | ICD-10-CM | POA: Diagnosis not present

## 2019-07-26 DIAGNOSIS — R2689 Other abnormalities of gait and mobility: Secondary | ICD-10-CM | POA: Diagnosis not present

## 2019-07-26 DIAGNOSIS — M6281 Muscle weakness (generalized): Secondary | ICD-10-CM | POA: Diagnosis not present

## 2019-07-26 DIAGNOSIS — I613 Nontraumatic intracerebral hemorrhage in brain stem: Secondary | ICD-10-CM | POA: Diagnosis not present

## 2019-07-26 DIAGNOSIS — N319 Neuromuscular dysfunction of bladder, unspecified: Secondary | ICD-10-CM | POA: Diagnosis not present

## 2019-07-26 DIAGNOSIS — R2681 Unsteadiness on feet: Secondary | ICD-10-CM | POA: Diagnosis not present

## 2019-07-28 DIAGNOSIS — M6281 Muscle weakness (generalized): Secondary | ICD-10-CM | POA: Diagnosis not present

## 2019-07-28 DIAGNOSIS — I613 Nontraumatic intracerebral hemorrhage in brain stem: Secondary | ICD-10-CM | POA: Diagnosis not present

## 2019-07-28 DIAGNOSIS — R2689 Other abnormalities of gait and mobility: Secondary | ICD-10-CM | POA: Diagnosis not present

## 2019-07-28 DIAGNOSIS — R278 Other lack of coordination: Secondary | ICD-10-CM | POA: Diagnosis not present

## 2019-07-28 DIAGNOSIS — R2681 Unsteadiness on feet: Secondary | ICD-10-CM | POA: Diagnosis not present

## 2019-07-29 DIAGNOSIS — M6281 Muscle weakness (generalized): Secondary | ICD-10-CM | POA: Diagnosis not present

## 2019-07-29 DIAGNOSIS — R2689 Other abnormalities of gait and mobility: Secondary | ICD-10-CM | POA: Diagnosis not present

## 2019-07-29 DIAGNOSIS — R2681 Unsteadiness on feet: Secondary | ICD-10-CM | POA: Diagnosis not present

## 2019-07-29 DIAGNOSIS — R278 Other lack of coordination: Secondary | ICD-10-CM | POA: Diagnosis not present

## 2019-07-29 DIAGNOSIS — I613 Nontraumatic intracerebral hemorrhage in brain stem: Secondary | ICD-10-CM | POA: Diagnosis not present

## 2019-07-30 DIAGNOSIS — R2689 Other abnormalities of gait and mobility: Secondary | ICD-10-CM | POA: Diagnosis not present

## 2019-07-30 DIAGNOSIS — R278 Other lack of coordination: Secondary | ICD-10-CM | POA: Diagnosis not present

## 2019-07-30 DIAGNOSIS — I613 Nontraumatic intracerebral hemorrhage in brain stem: Secondary | ICD-10-CM | POA: Diagnosis not present

## 2019-07-30 DIAGNOSIS — M6281 Muscle weakness (generalized): Secondary | ICD-10-CM | POA: Diagnosis not present

## 2019-07-30 DIAGNOSIS — R2681 Unsteadiness on feet: Secondary | ICD-10-CM | POA: Diagnosis not present

## 2019-07-31 DIAGNOSIS — I613 Nontraumatic intracerebral hemorrhage in brain stem: Secondary | ICD-10-CM | POA: Diagnosis not present

## 2019-07-31 DIAGNOSIS — R2689 Other abnormalities of gait and mobility: Secondary | ICD-10-CM | POA: Diagnosis not present

## 2019-07-31 DIAGNOSIS — R278 Other lack of coordination: Secondary | ICD-10-CM | POA: Diagnosis not present

## 2019-07-31 DIAGNOSIS — R2681 Unsteadiness on feet: Secondary | ICD-10-CM | POA: Diagnosis not present

## 2019-07-31 DIAGNOSIS — M6281 Muscle weakness (generalized): Secondary | ICD-10-CM | POA: Diagnosis not present

## 2019-08-01 DIAGNOSIS — E440 Moderate protein-calorie malnutrition: Secondary | ICD-10-CM | POA: Diagnosis not present

## 2019-08-01 DIAGNOSIS — R6889 Other general symptoms and signs: Secondary | ICD-10-CM | POA: Diagnosis not present

## 2019-08-02 DIAGNOSIS — R2689 Other abnormalities of gait and mobility: Secondary | ICD-10-CM | POA: Diagnosis not present

## 2019-08-02 DIAGNOSIS — M6281 Muscle weakness (generalized): Secondary | ICD-10-CM | POA: Diagnosis not present

## 2019-08-02 DIAGNOSIS — I613 Nontraumatic intracerebral hemorrhage in brain stem: Secondary | ICD-10-CM | POA: Diagnosis not present

## 2019-08-02 DIAGNOSIS — R2681 Unsteadiness on feet: Secondary | ICD-10-CM | POA: Diagnosis not present

## 2019-08-02 DIAGNOSIS — R278 Other lack of coordination: Secondary | ICD-10-CM | POA: Diagnosis not present

## 2019-08-03 DIAGNOSIS — R278 Other lack of coordination: Secondary | ICD-10-CM | POA: Diagnosis not present

## 2019-08-03 DIAGNOSIS — R2689 Other abnormalities of gait and mobility: Secondary | ICD-10-CM | POA: Diagnosis not present

## 2019-08-03 DIAGNOSIS — I613 Nontraumatic intracerebral hemorrhage in brain stem: Secondary | ICD-10-CM | POA: Diagnosis not present

## 2019-08-03 DIAGNOSIS — M6281 Muscle weakness (generalized): Secondary | ICD-10-CM | POA: Diagnosis not present

## 2019-08-03 DIAGNOSIS — R2681 Unsteadiness on feet: Secondary | ICD-10-CM | POA: Diagnosis not present

## 2019-08-05 DIAGNOSIS — R2689 Other abnormalities of gait and mobility: Secondary | ICD-10-CM | POA: Diagnosis not present

## 2019-08-05 DIAGNOSIS — R2681 Unsteadiness on feet: Secondary | ICD-10-CM | POA: Diagnosis not present

## 2019-08-05 DIAGNOSIS — I613 Nontraumatic intracerebral hemorrhage in brain stem: Secondary | ICD-10-CM | POA: Diagnosis not present

## 2019-08-05 DIAGNOSIS — M6281 Muscle weakness (generalized): Secondary | ICD-10-CM | POA: Diagnosis not present

## 2019-08-05 DIAGNOSIS — R278 Other lack of coordination: Secondary | ICD-10-CM | POA: Diagnosis not present

## 2019-08-06 DIAGNOSIS — Z9181 History of falling: Secondary | ICD-10-CM | POA: Diagnosis not present

## 2019-08-06 DIAGNOSIS — I613 Nontraumatic intracerebral hemorrhage in brain stem: Secondary | ICD-10-CM | POA: Diagnosis not present

## 2019-08-06 DIAGNOSIS — I69319 Unspecified symptoms and signs involving cognitive functions following cerebral infarction: Secondary | ICD-10-CM | POA: Diagnosis not present

## 2019-08-06 DIAGNOSIS — M6281 Muscle weakness (generalized): Secondary | ICD-10-CM | POA: Diagnosis not present

## 2019-08-06 DIAGNOSIS — R2681 Unsteadiness on feet: Secondary | ICD-10-CM | POA: Diagnosis not present

## 2019-08-06 DIAGNOSIS — R278 Other lack of coordination: Secondary | ICD-10-CM | POA: Diagnosis not present

## 2019-08-06 DIAGNOSIS — R2689 Other abnormalities of gait and mobility: Secondary | ICD-10-CM | POA: Diagnosis not present

## 2019-08-07 DIAGNOSIS — R278 Other lack of coordination: Secondary | ICD-10-CM | POA: Diagnosis not present

## 2019-08-07 DIAGNOSIS — R2689 Other abnormalities of gait and mobility: Secondary | ICD-10-CM | POA: Diagnosis not present

## 2019-08-07 DIAGNOSIS — R2681 Unsteadiness on feet: Secondary | ICD-10-CM | POA: Diagnosis not present

## 2019-08-07 DIAGNOSIS — M6281 Muscle weakness (generalized): Secondary | ICD-10-CM | POA: Diagnosis not present

## 2019-08-07 DIAGNOSIS — I613 Nontraumatic intracerebral hemorrhage in brain stem: Secondary | ICD-10-CM | POA: Diagnosis not present

## 2019-08-08 DIAGNOSIS — M6281 Muscle weakness (generalized): Secondary | ICD-10-CM | POA: Diagnosis not present

## 2019-08-08 DIAGNOSIS — R278 Other lack of coordination: Secondary | ICD-10-CM | POA: Diagnosis not present

## 2019-08-08 DIAGNOSIS — R2689 Other abnormalities of gait and mobility: Secondary | ICD-10-CM | POA: Diagnosis not present

## 2019-08-08 DIAGNOSIS — R2681 Unsteadiness on feet: Secondary | ICD-10-CM | POA: Diagnosis not present

## 2019-08-08 DIAGNOSIS — I613 Nontraumatic intracerebral hemorrhage in brain stem: Secondary | ICD-10-CM | POA: Diagnosis not present

## 2019-08-09 DIAGNOSIS — R278 Other lack of coordination: Secondary | ICD-10-CM | POA: Diagnosis not present

## 2019-08-09 DIAGNOSIS — I613 Nontraumatic intracerebral hemorrhage in brain stem: Secondary | ICD-10-CM | POA: Diagnosis not present

## 2019-08-09 DIAGNOSIS — R2689 Other abnormalities of gait and mobility: Secondary | ICD-10-CM | POA: Diagnosis not present

## 2019-08-09 DIAGNOSIS — M6281 Muscle weakness (generalized): Secondary | ICD-10-CM | POA: Diagnosis not present

## 2019-08-09 DIAGNOSIS — R2681 Unsteadiness on feet: Secondary | ICD-10-CM | POA: Diagnosis not present

## 2019-08-10 DIAGNOSIS — I613 Nontraumatic intracerebral hemorrhage in brain stem: Secondary | ICD-10-CM | POA: Diagnosis not present

## 2019-08-10 DIAGNOSIS — R2681 Unsteadiness on feet: Secondary | ICD-10-CM | POA: Diagnosis not present

## 2019-08-10 DIAGNOSIS — R278 Other lack of coordination: Secondary | ICD-10-CM | POA: Diagnosis not present

## 2019-08-10 DIAGNOSIS — R2689 Other abnormalities of gait and mobility: Secondary | ICD-10-CM | POA: Diagnosis not present

## 2019-08-10 DIAGNOSIS — M6281 Muscle weakness (generalized): Secondary | ICD-10-CM | POA: Diagnosis not present

## 2019-08-12 DIAGNOSIS — R2689 Other abnormalities of gait and mobility: Secondary | ICD-10-CM | POA: Diagnosis not present

## 2019-08-12 DIAGNOSIS — M6281 Muscle weakness (generalized): Secondary | ICD-10-CM | POA: Diagnosis not present

## 2019-08-12 DIAGNOSIS — R2681 Unsteadiness on feet: Secondary | ICD-10-CM | POA: Diagnosis not present

## 2019-08-12 DIAGNOSIS — R278 Other lack of coordination: Secondary | ICD-10-CM | POA: Diagnosis not present

## 2019-08-12 DIAGNOSIS — I613 Nontraumatic intracerebral hemorrhage in brain stem: Secondary | ICD-10-CM | POA: Diagnosis not present

## 2019-08-13 DIAGNOSIS — R2689 Other abnormalities of gait and mobility: Secondary | ICD-10-CM | POA: Diagnosis not present

## 2019-08-13 DIAGNOSIS — R2681 Unsteadiness on feet: Secondary | ICD-10-CM | POA: Diagnosis not present

## 2019-08-13 DIAGNOSIS — R278 Other lack of coordination: Secondary | ICD-10-CM | POA: Diagnosis not present

## 2019-08-13 DIAGNOSIS — I613 Nontraumatic intracerebral hemorrhage in brain stem: Secondary | ICD-10-CM | POA: Diagnosis not present

## 2019-08-13 DIAGNOSIS — M6281 Muscle weakness (generalized): Secondary | ICD-10-CM | POA: Diagnosis not present

## 2019-08-14 DIAGNOSIS — R2689 Other abnormalities of gait and mobility: Secondary | ICD-10-CM | POA: Diagnosis not present

## 2019-08-14 DIAGNOSIS — R2681 Unsteadiness on feet: Secondary | ICD-10-CM | POA: Diagnosis not present

## 2019-08-14 DIAGNOSIS — R278 Other lack of coordination: Secondary | ICD-10-CM | POA: Diagnosis not present

## 2019-08-14 DIAGNOSIS — M6281 Muscle weakness (generalized): Secondary | ICD-10-CM | POA: Diagnosis not present

## 2019-08-14 DIAGNOSIS — I613 Nontraumatic intracerebral hemorrhage in brain stem: Secondary | ICD-10-CM | POA: Diagnosis not present

## 2019-08-15 DIAGNOSIS — R2689 Other abnormalities of gait and mobility: Secondary | ICD-10-CM | POA: Diagnosis not present

## 2019-08-15 DIAGNOSIS — I613 Nontraumatic intracerebral hemorrhage in brain stem: Secondary | ICD-10-CM | POA: Diagnosis not present

## 2019-08-15 DIAGNOSIS — R2681 Unsteadiness on feet: Secondary | ICD-10-CM | POA: Diagnosis not present

## 2019-08-15 DIAGNOSIS — R278 Other lack of coordination: Secondary | ICD-10-CM | POA: Diagnosis not present

## 2019-08-15 DIAGNOSIS — M6281 Muscle weakness (generalized): Secondary | ICD-10-CM | POA: Diagnosis not present

## 2019-08-16 DIAGNOSIS — R2689 Other abnormalities of gait and mobility: Secondary | ICD-10-CM | POA: Diagnosis not present

## 2019-08-16 DIAGNOSIS — M6281 Muscle weakness (generalized): Secondary | ICD-10-CM | POA: Diagnosis not present

## 2019-08-16 DIAGNOSIS — R2681 Unsteadiness on feet: Secondary | ICD-10-CM | POA: Diagnosis not present

## 2019-08-16 DIAGNOSIS — I613 Nontraumatic intracerebral hemorrhage in brain stem: Secondary | ICD-10-CM | POA: Diagnosis not present

## 2019-08-16 DIAGNOSIS — R278 Other lack of coordination: Secondary | ICD-10-CM | POA: Diagnosis not present

## 2019-08-17 DIAGNOSIS — R2681 Unsteadiness on feet: Secondary | ICD-10-CM | POA: Diagnosis not present

## 2019-08-17 DIAGNOSIS — R278 Other lack of coordination: Secondary | ICD-10-CM | POA: Diagnosis not present

## 2019-08-17 DIAGNOSIS — M6281 Muscle weakness (generalized): Secondary | ICD-10-CM | POA: Diagnosis not present

## 2019-08-17 DIAGNOSIS — I613 Nontraumatic intracerebral hemorrhage in brain stem: Secondary | ICD-10-CM | POA: Diagnosis not present

## 2019-08-17 DIAGNOSIS — R2689 Other abnormalities of gait and mobility: Secondary | ICD-10-CM | POA: Diagnosis not present

## 2019-08-19 DIAGNOSIS — I613 Nontraumatic intracerebral hemorrhage in brain stem: Secondary | ICD-10-CM | POA: Diagnosis not present

## 2019-08-19 DIAGNOSIS — R2689 Other abnormalities of gait and mobility: Secondary | ICD-10-CM | POA: Diagnosis not present

## 2019-08-19 DIAGNOSIS — R2681 Unsteadiness on feet: Secondary | ICD-10-CM | POA: Diagnosis not present

## 2019-08-19 DIAGNOSIS — M6281 Muscle weakness (generalized): Secondary | ICD-10-CM | POA: Diagnosis not present

## 2019-08-19 DIAGNOSIS — R278 Other lack of coordination: Secondary | ICD-10-CM | POA: Diagnosis not present

## 2019-08-20 ENCOUNTER — Non-Acute Institutional Stay: Payer: Medicare Other | Admitting: Internal Medicine

## 2019-08-20 DIAGNOSIS — R2681 Unsteadiness on feet: Secondary | ICD-10-CM | POA: Diagnosis not present

## 2019-08-20 DIAGNOSIS — M6281 Muscle weakness (generalized): Secondary | ICD-10-CM | POA: Diagnosis not present

## 2019-08-20 DIAGNOSIS — I613 Nontraumatic intracerebral hemorrhage in brain stem: Secondary | ICD-10-CM | POA: Diagnosis not present

## 2019-08-20 DIAGNOSIS — R278 Other lack of coordination: Secondary | ICD-10-CM | POA: Diagnosis not present

## 2019-08-20 DIAGNOSIS — R2689 Other abnormalities of gait and mobility: Secondary | ICD-10-CM | POA: Diagnosis not present

## 2019-08-21 ENCOUNTER — Other Ambulatory Visit: Payer: Self-pay

## 2019-08-21 DIAGNOSIS — R2681 Unsteadiness on feet: Secondary | ICD-10-CM | POA: Diagnosis not present

## 2019-08-21 DIAGNOSIS — M6281 Muscle weakness (generalized): Secondary | ICD-10-CM | POA: Diagnosis not present

## 2019-08-21 DIAGNOSIS — R278 Other lack of coordination: Secondary | ICD-10-CM | POA: Diagnosis not present

## 2019-08-21 DIAGNOSIS — R2689 Other abnormalities of gait and mobility: Secondary | ICD-10-CM | POA: Diagnosis not present

## 2019-08-21 DIAGNOSIS — I613 Nontraumatic intracerebral hemorrhage in brain stem: Secondary | ICD-10-CM | POA: Diagnosis not present

## 2019-08-22 DIAGNOSIS — R2681 Unsteadiness on feet: Secondary | ICD-10-CM | POA: Diagnosis not present

## 2019-08-22 DIAGNOSIS — R2689 Other abnormalities of gait and mobility: Secondary | ICD-10-CM | POA: Diagnosis not present

## 2019-08-22 DIAGNOSIS — M6281 Muscle weakness (generalized): Secondary | ICD-10-CM | POA: Diagnosis not present

## 2019-08-22 DIAGNOSIS — I613 Nontraumatic intracerebral hemorrhage in brain stem: Secondary | ICD-10-CM | POA: Diagnosis not present

## 2019-08-22 DIAGNOSIS — R278 Other lack of coordination: Secondary | ICD-10-CM | POA: Diagnosis not present

## 2019-08-23 DIAGNOSIS — R2681 Unsteadiness on feet: Secondary | ICD-10-CM | POA: Diagnosis not present

## 2019-08-23 DIAGNOSIS — M6281 Muscle weakness (generalized): Secondary | ICD-10-CM | POA: Diagnosis not present

## 2019-08-23 DIAGNOSIS — N319 Neuromuscular dysfunction of bladder, unspecified: Secondary | ICD-10-CM | POA: Diagnosis not present

## 2019-08-23 DIAGNOSIS — R278 Other lack of coordination: Secondary | ICD-10-CM | POA: Diagnosis not present

## 2019-08-23 DIAGNOSIS — R2689 Other abnormalities of gait and mobility: Secondary | ICD-10-CM | POA: Diagnosis not present

## 2019-08-23 DIAGNOSIS — I613 Nontraumatic intracerebral hemorrhage in brain stem: Secondary | ICD-10-CM | POA: Diagnosis not present

## 2019-08-24 DIAGNOSIS — M6281 Muscle weakness (generalized): Secondary | ICD-10-CM | POA: Diagnosis not present

## 2019-08-24 DIAGNOSIS — R2689 Other abnormalities of gait and mobility: Secondary | ICD-10-CM | POA: Diagnosis not present

## 2019-08-24 DIAGNOSIS — I613 Nontraumatic intracerebral hemorrhage in brain stem: Secondary | ICD-10-CM | POA: Diagnosis not present

## 2019-08-24 DIAGNOSIS — R2681 Unsteadiness on feet: Secondary | ICD-10-CM | POA: Diagnosis not present

## 2019-08-24 DIAGNOSIS — R278 Other lack of coordination: Secondary | ICD-10-CM | POA: Diagnosis not present

## 2019-08-26 DIAGNOSIS — R2681 Unsteadiness on feet: Secondary | ICD-10-CM | POA: Diagnosis not present

## 2019-08-26 DIAGNOSIS — R278 Other lack of coordination: Secondary | ICD-10-CM | POA: Diagnosis not present

## 2019-08-26 DIAGNOSIS — M6281 Muscle weakness (generalized): Secondary | ICD-10-CM | POA: Diagnosis not present

## 2019-08-26 DIAGNOSIS — I613 Nontraumatic intracerebral hemorrhage in brain stem: Secondary | ICD-10-CM | POA: Diagnosis not present

## 2019-08-26 DIAGNOSIS — R2689 Other abnormalities of gait and mobility: Secondary | ICD-10-CM | POA: Diagnosis not present

## 2019-08-27 DIAGNOSIS — I613 Nontraumatic intracerebral hemorrhage in brain stem: Secondary | ICD-10-CM | POA: Diagnosis not present

## 2019-08-27 DIAGNOSIS — R2681 Unsteadiness on feet: Secondary | ICD-10-CM | POA: Diagnosis not present

## 2019-08-27 DIAGNOSIS — M6281 Muscle weakness (generalized): Secondary | ICD-10-CM | POA: Diagnosis not present

## 2019-08-27 DIAGNOSIS — R278 Other lack of coordination: Secondary | ICD-10-CM | POA: Diagnosis not present

## 2019-08-27 DIAGNOSIS — E440 Moderate protein-calorie malnutrition: Secondary | ICD-10-CM | POA: Diagnosis not present

## 2019-08-27 DIAGNOSIS — R2689 Other abnormalities of gait and mobility: Secondary | ICD-10-CM | POA: Diagnosis not present

## 2019-08-27 DIAGNOSIS — R338 Other retention of urine: Secondary | ICD-10-CM | POA: Diagnosis not present

## 2019-08-28 DIAGNOSIS — R2681 Unsteadiness on feet: Secondary | ICD-10-CM | POA: Diagnosis not present

## 2019-08-28 DIAGNOSIS — I613 Nontraumatic intracerebral hemorrhage in brain stem: Secondary | ICD-10-CM | POA: Diagnosis not present

## 2019-08-28 DIAGNOSIS — R2689 Other abnormalities of gait and mobility: Secondary | ICD-10-CM | POA: Diagnosis not present

## 2019-08-28 DIAGNOSIS — M6281 Muscle weakness (generalized): Secondary | ICD-10-CM | POA: Diagnosis not present

## 2019-08-28 DIAGNOSIS — R278 Other lack of coordination: Secondary | ICD-10-CM | POA: Diagnosis not present

## 2019-08-29 DIAGNOSIS — R2689 Other abnormalities of gait and mobility: Secondary | ICD-10-CM | POA: Diagnosis not present

## 2019-08-29 DIAGNOSIS — I613 Nontraumatic intracerebral hemorrhage in brain stem: Secondary | ICD-10-CM | POA: Diagnosis not present

## 2019-08-29 DIAGNOSIS — R2681 Unsteadiness on feet: Secondary | ICD-10-CM | POA: Diagnosis not present

## 2019-08-29 DIAGNOSIS — R278 Other lack of coordination: Secondary | ICD-10-CM | POA: Diagnosis not present

## 2019-08-29 DIAGNOSIS — M6281 Muscle weakness (generalized): Secondary | ICD-10-CM | POA: Diagnosis not present

## 2019-08-30 DIAGNOSIS — R278 Other lack of coordination: Secondary | ICD-10-CM | POA: Diagnosis not present

## 2019-08-30 DIAGNOSIS — R2689 Other abnormalities of gait and mobility: Secondary | ICD-10-CM | POA: Diagnosis not present

## 2019-08-30 DIAGNOSIS — I613 Nontraumatic intracerebral hemorrhage in brain stem: Secondary | ICD-10-CM | POA: Diagnosis not present

## 2019-08-30 DIAGNOSIS — M6281 Muscle weakness (generalized): Secondary | ICD-10-CM | POA: Diagnosis not present

## 2019-08-30 DIAGNOSIS — R2681 Unsteadiness on feet: Secondary | ICD-10-CM | POA: Diagnosis not present

## 2019-08-31 DIAGNOSIS — R278 Other lack of coordination: Secondary | ICD-10-CM | POA: Diagnosis not present

## 2019-08-31 DIAGNOSIS — R2689 Other abnormalities of gait and mobility: Secondary | ICD-10-CM | POA: Diagnosis not present

## 2019-08-31 DIAGNOSIS — R2681 Unsteadiness on feet: Secondary | ICD-10-CM | POA: Diagnosis not present

## 2019-08-31 DIAGNOSIS — I613 Nontraumatic intracerebral hemorrhage in brain stem: Secondary | ICD-10-CM | POA: Diagnosis not present

## 2019-08-31 DIAGNOSIS — M6281 Muscle weakness (generalized): Secondary | ICD-10-CM | POA: Diagnosis not present

## 2019-09-02 DIAGNOSIS — R2689 Other abnormalities of gait and mobility: Secondary | ICD-10-CM | POA: Diagnosis not present

## 2019-09-02 DIAGNOSIS — I613 Nontraumatic intracerebral hemorrhage in brain stem: Secondary | ICD-10-CM | POA: Diagnosis not present

## 2019-09-02 DIAGNOSIS — R2681 Unsteadiness on feet: Secondary | ICD-10-CM | POA: Diagnosis not present

## 2019-09-02 DIAGNOSIS — R278 Other lack of coordination: Secondary | ICD-10-CM | POA: Diagnosis not present

## 2019-09-02 DIAGNOSIS — M6281 Muscle weakness (generalized): Secondary | ICD-10-CM | POA: Diagnosis not present

## 2019-09-03 DIAGNOSIS — R278 Other lack of coordination: Secondary | ICD-10-CM | POA: Diagnosis not present

## 2019-09-03 DIAGNOSIS — M6281 Muscle weakness (generalized): Secondary | ICD-10-CM | POA: Diagnosis not present

## 2019-09-03 DIAGNOSIS — C50911 Malignant neoplasm of unspecified site of right female breast: Secondary | ICD-10-CM | POA: Diagnosis not present

## 2019-09-03 DIAGNOSIS — I613 Nontraumatic intracerebral hemorrhage in brain stem: Secondary | ICD-10-CM | POA: Diagnosis not present

## 2019-09-03 DIAGNOSIS — R2681 Unsteadiness on feet: Secondary | ICD-10-CM | POA: Diagnosis not present

## 2019-09-03 DIAGNOSIS — R2689 Other abnormalities of gait and mobility: Secondary | ICD-10-CM | POA: Diagnosis not present

## 2019-09-04 DIAGNOSIS — R278 Other lack of coordination: Secondary | ICD-10-CM | POA: Diagnosis not present

## 2019-09-04 DIAGNOSIS — R2681 Unsteadiness on feet: Secondary | ICD-10-CM | POA: Diagnosis not present

## 2019-09-04 DIAGNOSIS — M6281 Muscle weakness (generalized): Secondary | ICD-10-CM | POA: Diagnosis not present

## 2019-09-04 DIAGNOSIS — I613 Nontraumatic intracerebral hemorrhage in brain stem: Secondary | ICD-10-CM | POA: Diagnosis not present

## 2019-09-04 DIAGNOSIS — R2689 Other abnormalities of gait and mobility: Secondary | ICD-10-CM | POA: Diagnosis not present

## 2019-09-05 DIAGNOSIS — R278 Other lack of coordination: Secondary | ICD-10-CM | POA: Diagnosis not present

## 2019-09-05 DIAGNOSIS — I613 Nontraumatic intracerebral hemorrhage in brain stem: Secondary | ICD-10-CM | POA: Diagnosis not present

## 2019-09-05 DIAGNOSIS — R2689 Other abnormalities of gait and mobility: Secondary | ICD-10-CM | POA: Diagnosis not present

## 2019-09-05 DIAGNOSIS — M6281 Muscle weakness (generalized): Secondary | ICD-10-CM | POA: Diagnosis not present

## 2019-09-05 DIAGNOSIS — R2681 Unsteadiness on feet: Secondary | ICD-10-CM | POA: Diagnosis not present

## 2019-09-05 DIAGNOSIS — R198 Other specified symptoms and signs involving the digestive system and abdomen: Secondary | ICD-10-CM | POA: Diagnosis not present

## 2019-09-06 DIAGNOSIS — I69319 Unspecified symptoms and signs involving cognitive functions following cerebral infarction: Secondary | ICD-10-CM | POA: Diagnosis not present

## 2019-09-06 DIAGNOSIS — M6281 Muscle weakness (generalized): Secondary | ICD-10-CM | POA: Diagnosis not present

## 2019-09-06 DIAGNOSIS — R278 Other lack of coordination: Secondary | ICD-10-CM | POA: Diagnosis not present

## 2019-09-06 DIAGNOSIS — I613 Nontraumatic intracerebral hemorrhage in brain stem: Secondary | ICD-10-CM | POA: Diagnosis not present

## 2019-09-06 DIAGNOSIS — R2681 Unsteadiness on feet: Secondary | ICD-10-CM | POA: Diagnosis not present

## 2019-09-06 DIAGNOSIS — Z9181 History of falling: Secondary | ICD-10-CM | POA: Diagnosis not present

## 2019-09-06 DIAGNOSIS — R2689 Other abnormalities of gait and mobility: Secondary | ICD-10-CM | POA: Diagnosis not present

## 2019-09-07 DIAGNOSIS — R2689 Other abnormalities of gait and mobility: Secondary | ICD-10-CM | POA: Diagnosis not present

## 2019-09-07 DIAGNOSIS — I613 Nontraumatic intracerebral hemorrhage in brain stem: Secondary | ICD-10-CM | POA: Diagnosis not present

## 2019-09-07 DIAGNOSIS — R278 Other lack of coordination: Secondary | ICD-10-CM | POA: Diagnosis not present

## 2019-09-07 DIAGNOSIS — R2681 Unsteadiness on feet: Secondary | ICD-10-CM | POA: Diagnosis not present

## 2019-09-07 DIAGNOSIS — M6281 Muscle weakness (generalized): Secondary | ICD-10-CM | POA: Diagnosis not present

## 2019-09-09 DIAGNOSIS — R2689 Other abnormalities of gait and mobility: Secondary | ICD-10-CM | POA: Diagnosis not present

## 2019-09-09 DIAGNOSIS — R2681 Unsteadiness on feet: Secondary | ICD-10-CM | POA: Diagnosis not present

## 2019-09-09 DIAGNOSIS — I613 Nontraumatic intracerebral hemorrhage in brain stem: Secondary | ICD-10-CM | POA: Diagnosis not present

## 2019-09-09 DIAGNOSIS — M6281 Muscle weakness (generalized): Secondary | ICD-10-CM | POA: Diagnosis not present

## 2019-09-09 DIAGNOSIS — R278 Other lack of coordination: Secondary | ICD-10-CM | POA: Diagnosis not present

## 2019-09-10 DIAGNOSIS — R278 Other lack of coordination: Secondary | ICD-10-CM | POA: Diagnosis not present

## 2019-09-10 DIAGNOSIS — I613 Nontraumatic intracerebral hemorrhage in brain stem: Secondary | ICD-10-CM | POA: Diagnosis not present

## 2019-09-10 DIAGNOSIS — R2681 Unsteadiness on feet: Secondary | ICD-10-CM | POA: Diagnosis not present

## 2019-09-10 DIAGNOSIS — R2689 Other abnormalities of gait and mobility: Secondary | ICD-10-CM | POA: Diagnosis not present

## 2019-09-10 DIAGNOSIS — M6281 Muscle weakness (generalized): Secondary | ICD-10-CM | POA: Diagnosis not present

## 2019-09-11 DIAGNOSIS — I613 Nontraumatic intracerebral hemorrhage in brain stem: Secondary | ICD-10-CM | POA: Diagnosis not present

## 2019-09-11 DIAGNOSIS — M6281 Muscle weakness (generalized): Secondary | ICD-10-CM | POA: Diagnosis not present

## 2019-09-11 DIAGNOSIS — R2681 Unsteadiness on feet: Secondary | ICD-10-CM | POA: Diagnosis not present

## 2019-09-11 DIAGNOSIS — R278 Other lack of coordination: Secondary | ICD-10-CM | POA: Diagnosis not present

## 2019-09-11 DIAGNOSIS — R2689 Other abnormalities of gait and mobility: Secondary | ICD-10-CM | POA: Diagnosis not present

## 2019-09-12 DIAGNOSIS — R2681 Unsteadiness on feet: Secondary | ICD-10-CM | POA: Diagnosis not present

## 2019-09-12 DIAGNOSIS — R278 Other lack of coordination: Secondary | ICD-10-CM | POA: Diagnosis not present

## 2019-09-12 DIAGNOSIS — I613 Nontraumatic intracerebral hemorrhage in brain stem: Secondary | ICD-10-CM | POA: Diagnosis not present

## 2019-09-12 DIAGNOSIS — R2689 Other abnormalities of gait and mobility: Secondary | ICD-10-CM | POA: Diagnosis not present

## 2019-09-12 DIAGNOSIS — M6281 Muscle weakness (generalized): Secondary | ICD-10-CM | POA: Diagnosis not present

## 2019-09-13 DIAGNOSIS — M6281 Muscle weakness (generalized): Secondary | ICD-10-CM | POA: Diagnosis not present

## 2019-09-13 DIAGNOSIS — R278 Other lack of coordination: Secondary | ICD-10-CM | POA: Diagnosis not present

## 2019-09-13 DIAGNOSIS — R2689 Other abnormalities of gait and mobility: Secondary | ICD-10-CM | POA: Diagnosis not present

## 2019-09-13 DIAGNOSIS — R2681 Unsteadiness on feet: Secondary | ICD-10-CM | POA: Diagnosis not present

## 2019-09-13 DIAGNOSIS — I613 Nontraumatic intracerebral hemorrhage in brain stem: Secondary | ICD-10-CM | POA: Diagnosis not present

## 2019-09-15 DIAGNOSIS — R2689 Other abnormalities of gait and mobility: Secondary | ICD-10-CM | POA: Diagnosis not present

## 2019-09-15 DIAGNOSIS — R278 Other lack of coordination: Secondary | ICD-10-CM | POA: Diagnosis not present

## 2019-09-15 DIAGNOSIS — M6281 Muscle weakness (generalized): Secondary | ICD-10-CM | POA: Diagnosis not present

## 2019-09-15 DIAGNOSIS — I613 Nontraumatic intracerebral hemorrhage in brain stem: Secondary | ICD-10-CM | POA: Diagnosis not present

## 2019-09-15 DIAGNOSIS — R2681 Unsteadiness on feet: Secondary | ICD-10-CM | POA: Diagnosis not present

## 2019-09-16 DIAGNOSIS — I613 Nontraumatic intracerebral hemorrhage in brain stem: Secondary | ICD-10-CM | POA: Diagnosis not present

## 2019-09-16 DIAGNOSIS — R2681 Unsteadiness on feet: Secondary | ICD-10-CM | POA: Diagnosis not present

## 2019-09-16 DIAGNOSIS — R278 Other lack of coordination: Secondary | ICD-10-CM | POA: Diagnosis not present

## 2019-09-16 DIAGNOSIS — M6281 Muscle weakness (generalized): Secondary | ICD-10-CM | POA: Diagnosis not present

## 2019-09-16 DIAGNOSIS — R2689 Other abnormalities of gait and mobility: Secondary | ICD-10-CM | POA: Diagnosis not present

## 2019-09-17 DIAGNOSIS — R278 Other lack of coordination: Secondary | ICD-10-CM | POA: Diagnosis not present

## 2019-09-17 DIAGNOSIS — Z9181 History of falling: Secondary | ICD-10-CM | POA: Diagnosis not present

## 2019-09-17 DIAGNOSIS — I613 Nontraumatic intracerebral hemorrhage in brain stem: Secondary | ICD-10-CM | POA: Diagnosis not present

## 2019-09-17 DIAGNOSIS — R2681 Unsteadiness on feet: Secondary | ICD-10-CM | POA: Diagnosis not present

## 2019-09-17 DIAGNOSIS — I69319 Unspecified symptoms and signs involving cognitive functions following cerebral infarction: Secondary | ICD-10-CM | POA: Diagnosis not present

## 2019-09-17 DIAGNOSIS — M6281 Muscle weakness (generalized): Secondary | ICD-10-CM | POA: Diagnosis not present

## 2019-09-17 DIAGNOSIS — R2689 Other abnormalities of gait and mobility: Secondary | ICD-10-CM | POA: Diagnosis not present

## 2019-09-18 DIAGNOSIS — R278 Other lack of coordination: Secondary | ICD-10-CM | POA: Diagnosis not present

## 2019-09-18 DIAGNOSIS — R2681 Unsteadiness on feet: Secondary | ICD-10-CM | POA: Diagnosis not present

## 2019-09-18 DIAGNOSIS — R2689 Other abnormalities of gait and mobility: Secondary | ICD-10-CM | POA: Diagnosis not present

## 2019-09-18 DIAGNOSIS — I613 Nontraumatic intracerebral hemorrhage in brain stem: Secondary | ICD-10-CM | POA: Diagnosis not present

## 2019-09-18 DIAGNOSIS — M6281 Muscle weakness (generalized): Secondary | ICD-10-CM | POA: Diagnosis not present

## 2019-09-19 DIAGNOSIS — R2681 Unsteadiness on feet: Secondary | ICD-10-CM | POA: Diagnosis not present

## 2019-09-19 DIAGNOSIS — M6281 Muscle weakness (generalized): Secondary | ICD-10-CM | POA: Diagnosis not present

## 2019-09-19 DIAGNOSIS — I613 Nontraumatic intracerebral hemorrhage in brain stem: Secondary | ICD-10-CM | POA: Diagnosis not present

## 2019-09-19 DIAGNOSIS — R2689 Other abnormalities of gait and mobility: Secondary | ICD-10-CM | POA: Diagnosis not present

## 2019-09-19 DIAGNOSIS — R278 Other lack of coordination: Secondary | ICD-10-CM | POA: Diagnosis not present

## 2019-09-20 DIAGNOSIS — R2689 Other abnormalities of gait and mobility: Secondary | ICD-10-CM | POA: Diagnosis not present

## 2019-09-20 DIAGNOSIS — I613 Nontraumatic intracerebral hemorrhage in brain stem: Secondary | ICD-10-CM | POA: Diagnosis not present

## 2019-09-20 DIAGNOSIS — R2681 Unsteadiness on feet: Secondary | ICD-10-CM | POA: Diagnosis not present

## 2019-09-20 DIAGNOSIS — R278 Other lack of coordination: Secondary | ICD-10-CM | POA: Diagnosis not present

## 2019-09-20 DIAGNOSIS — M6281 Muscle weakness (generalized): Secondary | ICD-10-CM | POA: Diagnosis not present

## 2019-09-21 DIAGNOSIS — R278 Other lack of coordination: Secondary | ICD-10-CM | POA: Diagnosis not present

## 2019-09-21 DIAGNOSIS — R2681 Unsteadiness on feet: Secondary | ICD-10-CM | POA: Diagnosis not present

## 2019-09-21 DIAGNOSIS — I613 Nontraumatic intracerebral hemorrhage in brain stem: Secondary | ICD-10-CM | POA: Diagnosis not present

## 2019-09-21 DIAGNOSIS — R2689 Other abnormalities of gait and mobility: Secondary | ICD-10-CM | POA: Diagnosis not present

## 2019-09-21 DIAGNOSIS — M6281 Muscle weakness (generalized): Secondary | ICD-10-CM | POA: Diagnosis not present

## 2019-09-23 DIAGNOSIS — R278 Other lack of coordination: Secondary | ICD-10-CM | POA: Diagnosis not present

## 2019-09-23 DIAGNOSIS — I613 Nontraumatic intracerebral hemorrhage in brain stem: Secondary | ICD-10-CM | POA: Diagnosis not present

## 2019-09-23 DIAGNOSIS — R2681 Unsteadiness on feet: Secondary | ICD-10-CM | POA: Diagnosis not present

## 2019-09-23 DIAGNOSIS — R2689 Other abnormalities of gait and mobility: Secondary | ICD-10-CM | POA: Diagnosis not present

## 2019-09-23 DIAGNOSIS — M6281 Muscle weakness (generalized): Secondary | ICD-10-CM | POA: Diagnosis not present

## 2019-09-24 DIAGNOSIS — I613 Nontraumatic intracerebral hemorrhage in brain stem: Secondary | ICD-10-CM | POA: Diagnosis not present

## 2019-09-24 DIAGNOSIS — R278 Other lack of coordination: Secondary | ICD-10-CM | POA: Diagnosis not present

## 2019-09-24 DIAGNOSIS — M6281 Muscle weakness (generalized): Secondary | ICD-10-CM | POA: Diagnosis not present

## 2019-09-24 DIAGNOSIS — R2681 Unsteadiness on feet: Secondary | ICD-10-CM | POA: Diagnosis not present

## 2019-09-24 DIAGNOSIS — R2689 Other abnormalities of gait and mobility: Secondary | ICD-10-CM | POA: Diagnosis not present

## 2019-09-25 DIAGNOSIS — R2681 Unsteadiness on feet: Secondary | ICD-10-CM | POA: Diagnosis not present

## 2019-09-25 DIAGNOSIS — N319 Neuromuscular dysfunction of bladder, unspecified: Secondary | ICD-10-CM | POA: Diagnosis not present

## 2019-09-25 DIAGNOSIS — R2689 Other abnormalities of gait and mobility: Secondary | ICD-10-CM | POA: Diagnosis not present

## 2019-09-25 DIAGNOSIS — R278 Other lack of coordination: Secondary | ICD-10-CM | POA: Diagnosis not present

## 2019-09-25 DIAGNOSIS — I613 Nontraumatic intracerebral hemorrhage in brain stem: Secondary | ICD-10-CM | POA: Diagnosis not present

## 2019-09-25 DIAGNOSIS — M6281 Muscle weakness (generalized): Secondary | ICD-10-CM | POA: Diagnosis not present

## 2019-09-26 DIAGNOSIS — R278 Other lack of coordination: Secondary | ICD-10-CM | POA: Diagnosis not present

## 2019-09-26 DIAGNOSIS — R2689 Other abnormalities of gait and mobility: Secondary | ICD-10-CM | POA: Diagnosis not present

## 2019-09-26 DIAGNOSIS — R2681 Unsteadiness on feet: Secondary | ICD-10-CM | POA: Diagnosis not present

## 2019-09-26 DIAGNOSIS — M6281 Muscle weakness (generalized): Secondary | ICD-10-CM | POA: Diagnosis not present

## 2019-09-26 DIAGNOSIS — I613 Nontraumatic intracerebral hemorrhage in brain stem: Secondary | ICD-10-CM | POA: Diagnosis not present

## 2019-09-27 DIAGNOSIS — I613 Nontraumatic intracerebral hemorrhage in brain stem: Secondary | ICD-10-CM | POA: Diagnosis not present

## 2019-09-27 DIAGNOSIS — M6281 Muscle weakness (generalized): Secondary | ICD-10-CM | POA: Diagnosis not present

## 2019-09-27 DIAGNOSIS — R2689 Other abnormalities of gait and mobility: Secondary | ICD-10-CM | POA: Diagnosis not present

## 2019-09-27 DIAGNOSIS — R2681 Unsteadiness on feet: Secondary | ICD-10-CM | POA: Diagnosis not present

## 2019-09-27 DIAGNOSIS — R278 Other lack of coordination: Secondary | ICD-10-CM | POA: Diagnosis not present

## 2019-09-28 DIAGNOSIS — R278 Other lack of coordination: Secondary | ICD-10-CM | POA: Diagnosis not present

## 2019-09-28 DIAGNOSIS — R2681 Unsteadiness on feet: Secondary | ICD-10-CM | POA: Diagnosis not present

## 2019-09-28 DIAGNOSIS — M6281 Muscle weakness (generalized): Secondary | ICD-10-CM | POA: Diagnosis not present

## 2019-09-28 DIAGNOSIS — I613 Nontraumatic intracerebral hemorrhage in brain stem: Secondary | ICD-10-CM | POA: Diagnosis not present

## 2019-09-28 DIAGNOSIS — R2689 Other abnormalities of gait and mobility: Secondary | ICD-10-CM | POA: Diagnosis not present

## 2019-09-30 DIAGNOSIS — I613 Nontraumatic intracerebral hemorrhage in brain stem: Secondary | ICD-10-CM | POA: Diagnosis not present

## 2019-09-30 DIAGNOSIS — R2681 Unsteadiness on feet: Secondary | ICD-10-CM | POA: Diagnosis not present

## 2019-09-30 DIAGNOSIS — M6281 Muscle weakness (generalized): Secondary | ICD-10-CM | POA: Diagnosis not present

## 2019-09-30 DIAGNOSIS — R278 Other lack of coordination: Secondary | ICD-10-CM | POA: Diagnosis not present

## 2019-09-30 DIAGNOSIS — R2689 Other abnormalities of gait and mobility: Secondary | ICD-10-CM | POA: Diagnosis not present

## 2019-10-01 DIAGNOSIS — M6281 Muscle weakness (generalized): Secondary | ICD-10-CM | POA: Diagnosis not present

## 2019-10-01 DIAGNOSIS — I613 Nontraumatic intracerebral hemorrhage in brain stem: Secondary | ICD-10-CM | POA: Diagnosis not present

## 2019-10-01 DIAGNOSIS — R2689 Other abnormalities of gait and mobility: Secondary | ICD-10-CM | POA: Diagnosis not present

## 2019-10-01 DIAGNOSIS — R278 Other lack of coordination: Secondary | ICD-10-CM | POA: Diagnosis not present

## 2019-10-01 DIAGNOSIS — R2681 Unsteadiness on feet: Secondary | ICD-10-CM | POA: Diagnosis not present

## 2019-10-02 DIAGNOSIS — R2681 Unsteadiness on feet: Secondary | ICD-10-CM | POA: Diagnosis not present

## 2019-10-02 DIAGNOSIS — R2689 Other abnormalities of gait and mobility: Secondary | ICD-10-CM | POA: Diagnosis not present

## 2019-10-02 DIAGNOSIS — M6281 Muscle weakness (generalized): Secondary | ICD-10-CM | POA: Diagnosis not present

## 2019-10-02 DIAGNOSIS — I613 Nontraumatic intracerebral hemorrhage in brain stem: Secondary | ICD-10-CM | POA: Diagnosis not present

## 2019-10-02 DIAGNOSIS — R278 Other lack of coordination: Secondary | ICD-10-CM | POA: Diagnosis not present

## 2019-10-03 DIAGNOSIS — R2689 Other abnormalities of gait and mobility: Secondary | ICD-10-CM | POA: Diagnosis not present

## 2019-10-03 DIAGNOSIS — M6281 Muscle weakness (generalized): Secondary | ICD-10-CM | POA: Diagnosis not present

## 2019-10-03 DIAGNOSIS — R278 Other lack of coordination: Secondary | ICD-10-CM | POA: Diagnosis not present

## 2019-10-03 DIAGNOSIS — R2681 Unsteadiness on feet: Secondary | ICD-10-CM | POA: Diagnosis not present

## 2019-10-03 DIAGNOSIS — I613 Nontraumatic intracerebral hemorrhage in brain stem: Secondary | ICD-10-CM | POA: Diagnosis not present

## 2019-10-04 DIAGNOSIS — R2681 Unsteadiness on feet: Secondary | ICD-10-CM | POA: Diagnosis not present

## 2019-10-04 DIAGNOSIS — M6281 Muscle weakness (generalized): Secondary | ICD-10-CM | POA: Diagnosis not present

## 2019-10-04 DIAGNOSIS — I613 Nontraumatic intracerebral hemorrhage in brain stem: Secondary | ICD-10-CM | POA: Diagnosis not present

## 2019-10-04 DIAGNOSIS — R2689 Other abnormalities of gait and mobility: Secondary | ICD-10-CM | POA: Diagnosis not present

## 2019-10-04 DIAGNOSIS — R278 Other lack of coordination: Secondary | ICD-10-CM | POA: Diagnosis not present

## 2019-10-05 DIAGNOSIS — M6281 Muscle weakness (generalized): Secondary | ICD-10-CM | POA: Diagnosis not present

## 2019-10-05 DIAGNOSIS — R2689 Other abnormalities of gait and mobility: Secondary | ICD-10-CM | POA: Diagnosis not present

## 2019-10-05 DIAGNOSIS — I613 Nontraumatic intracerebral hemorrhage in brain stem: Secondary | ICD-10-CM | POA: Diagnosis not present

## 2019-10-05 DIAGNOSIS — R2681 Unsteadiness on feet: Secondary | ICD-10-CM | POA: Diagnosis not present

## 2019-10-05 DIAGNOSIS — R278 Other lack of coordination: Secondary | ICD-10-CM | POA: Diagnosis not present

## 2019-10-07 DIAGNOSIS — R2681 Unsteadiness on feet: Secondary | ICD-10-CM | POA: Diagnosis not present

## 2019-10-07 DIAGNOSIS — R2689 Other abnormalities of gait and mobility: Secondary | ICD-10-CM | POA: Diagnosis not present

## 2019-10-07 DIAGNOSIS — I613 Nontraumatic intracerebral hemorrhage in brain stem: Secondary | ICD-10-CM | POA: Diagnosis not present

## 2019-10-07 DIAGNOSIS — R278 Other lack of coordination: Secondary | ICD-10-CM | POA: Diagnosis not present

## 2019-10-07 DIAGNOSIS — M6281 Muscle weakness (generalized): Secondary | ICD-10-CM | POA: Diagnosis not present

## 2019-10-08 DIAGNOSIS — R296 Repeated falls: Secondary | ICD-10-CM | POA: Diagnosis not present

## 2019-10-08 DIAGNOSIS — R2681 Unsteadiness on feet: Secondary | ICD-10-CM | POA: Diagnosis not present

## 2019-10-08 DIAGNOSIS — I613 Nontraumatic intracerebral hemorrhage in brain stem: Secondary | ICD-10-CM | POA: Diagnosis not present

## 2019-10-08 DIAGNOSIS — R278 Other lack of coordination: Secondary | ICD-10-CM | POA: Diagnosis not present

## 2019-10-08 DIAGNOSIS — R2689 Other abnormalities of gait and mobility: Secondary | ICD-10-CM | POA: Diagnosis not present

## 2019-10-08 DIAGNOSIS — M6281 Muscle weakness (generalized): Secondary | ICD-10-CM | POA: Diagnosis not present

## 2019-10-09 DIAGNOSIS — M6281 Muscle weakness (generalized): Secondary | ICD-10-CM | POA: Diagnosis not present

## 2019-10-09 DIAGNOSIS — R2681 Unsteadiness on feet: Secondary | ICD-10-CM | POA: Diagnosis not present

## 2019-10-09 DIAGNOSIS — R278 Other lack of coordination: Secondary | ICD-10-CM | POA: Diagnosis not present

## 2019-10-09 DIAGNOSIS — I613 Nontraumatic intracerebral hemorrhage in brain stem: Secondary | ICD-10-CM | POA: Diagnosis not present

## 2019-10-09 DIAGNOSIS — R2689 Other abnormalities of gait and mobility: Secondary | ICD-10-CM | POA: Diagnosis not present

## 2019-10-10 DIAGNOSIS — M6281 Muscle weakness (generalized): Secondary | ICD-10-CM | POA: Diagnosis not present

## 2019-10-10 DIAGNOSIS — R278 Other lack of coordination: Secondary | ICD-10-CM | POA: Diagnosis not present

## 2019-10-10 DIAGNOSIS — R2689 Other abnormalities of gait and mobility: Secondary | ICD-10-CM | POA: Diagnosis not present

## 2019-10-10 DIAGNOSIS — R2681 Unsteadiness on feet: Secondary | ICD-10-CM | POA: Diagnosis not present

## 2019-10-10 DIAGNOSIS — I613 Nontraumatic intracerebral hemorrhage in brain stem: Secondary | ICD-10-CM | POA: Diagnosis not present

## 2019-10-11 DIAGNOSIS — R278 Other lack of coordination: Secondary | ICD-10-CM | POA: Diagnosis not present

## 2019-10-11 DIAGNOSIS — R2681 Unsteadiness on feet: Secondary | ICD-10-CM | POA: Diagnosis not present

## 2019-10-11 DIAGNOSIS — M6281 Muscle weakness (generalized): Secondary | ICD-10-CM | POA: Diagnosis not present

## 2019-10-11 DIAGNOSIS — R2689 Other abnormalities of gait and mobility: Secondary | ICD-10-CM | POA: Diagnosis not present

## 2019-10-11 DIAGNOSIS — I613 Nontraumatic intracerebral hemorrhage in brain stem: Secondary | ICD-10-CM | POA: Diagnosis not present

## 2019-10-12 DIAGNOSIS — I613 Nontraumatic intracerebral hemorrhage in brain stem: Secondary | ICD-10-CM | POA: Diagnosis not present

## 2019-10-12 DIAGNOSIS — R2681 Unsteadiness on feet: Secondary | ICD-10-CM | POA: Diagnosis not present

## 2019-10-12 DIAGNOSIS — R278 Other lack of coordination: Secondary | ICD-10-CM | POA: Diagnosis not present

## 2019-10-12 DIAGNOSIS — R2689 Other abnormalities of gait and mobility: Secondary | ICD-10-CM | POA: Diagnosis not present

## 2019-10-12 DIAGNOSIS — M6281 Muscle weakness (generalized): Secondary | ICD-10-CM | POA: Diagnosis not present

## 2019-10-14 DIAGNOSIS — I613 Nontraumatic intracerebral hemorrhage in brain stem: Secondary | ICD-10-CM | POA: Diagnosis not present

## 2019-10-14 DIAGNOSIS — M2041 Other hammer toe(s) (acquired), right foot: Secondary | ICD-10-CM | POA: Diagnosis not present

## 2019-10-14 DIAGNOSIS — M6281 Muscle weakness (generalized): Secondary | ICD-10-CM | POA: Diagnosis not present

## 2019-10-14 DIAGNOSIS — B351 Tinea unguium: Secondary | ICD-10-CM | POA: Diagnosis not present

## 2019-10-14 DIAGNOSIS — M2042 Other hammer toe(s) (acquired), left foot: Secondary | ICD-10-CM | POA: Diagnosis not present

## 2019-10-14 DIAGNOSIS — R2689 Other abnormalities of gait and mobility: Secondary | ICD-10-CM | POA: Diagnosis not present

## 2019-10-14 DIAGNOSIS — I739 Peripheral vascular disease, unspecified: Secondary | ICD-10-CM | POA: Diagnosis not present

## 2019-10-14 DIAGNOSIS — R278 Other lack of coordination: Secondary | ICD-10-CM | POA: Diagnosis not present

## 2019-10-14 DIAGNOSIS — R2681 Unsteadiness on feet: Secondary | ICD-10-CM | POA: Diagnosis not present

## 2019-10-15 DIAGNOSIS — M6281 Muscle weakness (generalized): Secondary | ICD-10-CM | POA: Diagnosis not present

## 2019-10-15 DIAGNOSIS — R278 Other lack of coordination: Secondary | ICD-10-CM | POA: Diagnosis not present

## 2019-10-15 DIAGNOSIS — R2689 Other abnormalities of gait and mobility: Secondary | ICD-10-CM | POA: Diagnosis not present

## 2019-10-15 DIAGNOSIS — R339 Retention of urine, unspecified: Secondary | ICD-10-CM | POA: Diagnosis not present

## 2019-10-15 DIAGNOSIS — T83091A Other mechanical complication of indwelling urethral catheter, initial encounter: Secondary | ICD-10-CM | POA: Diagnosis not present

## 2019-10-15 DIAGNOSIS — R2681 Unsteadiness on feet: Secondary | ICD-10-CM | POA: Diagnosis not present

## 2019-10-15 DIAGNOSIS — I613 Nontraumatic intracerebral hemorrhage in brain stem: Secondary | ICD-10-CM | POA: Diagnosis not present

## 2019-10-16 DIAGNOSIS — M6281 Muscle weakness (generalized): Secondary | ICD-10-CM | POA: Diagnosis not present

## 2019-10-16 DIAGNOSIS — R2681 Unsteadiness on feet: Secondary | ICD-10-CM | POA: Diagnosis not present

## 2019-10-16 DIAGNOSIS — I613 Nontraumatic intracerebral hemorrhage in brain stem: Secondary | ICD-10-CM | POA: Diagnosis not present

## 2019-10-16 DIAGNOSIS — R2689 Other abnormalities of gait and mobility: Secondary | ICD-10-CM | POA: Diagnosis not present

## 2019-10-16 DIAGNOSIS — R278 Other lack of coordination: Secondary | ICD-10-CM | POA: Diagnosis not present

## 2019-10-17 DIAGNOSIS — R278 Other lack of coordination: Secondary | ICD-10-CM | POA: Diagnosis not present

## 2019-10-17 DIAGNOSIS — R2681 Unsteadiness on feet: Secondary | ICD-10-CM | POA: Diagnosis not present

## 2019-10-17 DIAGNOSIS — I613 Nontraumatic intracerebral hemorrhage in brain stem: Secondary | ICD-10-CM | POA: Diagnosis not present

## 2019-10-17 DIAGNOSIS — M6281 Muscle weakness (generalized): Secondary | ICD-10-CM | POA: Diagnosis not present

## 2019-10-17 DIAGNOSIS — R2689 Other abnormalities of gait and mobility: Secondary | ICD-10-CM | POA: Diagnosis not present

## 2019-10-18 DIAGNOSIS — R2689 Other abnormalities of gait and mobility: Secondary | ICD-10-CM | POA: Diagnosis not present

## 2019-10-18 DIAGNOSIS — N319 Neuromuscular dysfunction of bladder, unspecified: Secondary | ICD-10-CM | POA: Diagnosis not present

## 2019-10-18 DIAGNOSIS — M6281 Muscle weakness (generalized): Secondary | ICD-10-CM | POA: Diagnosis not present

## 2019-10-18 DIAGNOSIS — R278 Other lack of coordination: Secondary | ICD-10-CM | POA: Diagnosis not present

## 2019-10-18 DIAGNOSIS — R2681 Unsteadiness on feet: Secondary | ICD-10-CM | POA: Diagnosis not present

## 2019-10-18 DIAGNOSIS — I613 Nontraumatic intracerebral hemorrhage in brain stem: Secondary | ICD-10-CM | POA: Diagnosis not present

## 2019-10-19 DIAGNOSIS — N390 Urinary tract infection, site not specified: Secondary | ICD-10-CM | POA: Diagnosis not present

## 2019-10-19 DIAGNOSIS — R69 Illness, unspecified: Secondary | ICD-10-CM | POA: Diagnosis not present

## 2019-10-19 DIAGNOSIS — R2681 Unsteadiness on feet: Secondary | ICD-10-CM | POA: Diagnosis not present

## 2019-10-19 DIAGNOSIS — R2689 Other abnormalities of gait and mobility: Secondary | ICD-10-CM | POA: Diagnosis not present

## 2019-10-19 DIAGNOSIS — M6281 Muscle weakness (generalized): Secondary | ICD-10-CM | POA: Diagnosis not present

## 2019-10-19 DIAGNOSIS — Z79899 Other long term (current) drug therapy: Secondary | ICD-10-CM | POA: Diagnosis not present

## 2019-10-19 DIAGNOSIS — R278 Other lack of coordination: Secondary | ICD-10-CM | POA: Diagnosis not present

## 2019-10-19 DIAGNOSIS — I613 Nontraumatic intracerebral hemorrhage in brain stem: Secondary | ICD-10-CM | POA: Diagnosis not present

## 2019-10-21 DIAGNOSIS — I613 Nontraumatic intracerebral hemorrhage in brain stem: Secondary | ICD-10-CM | POA: Diagnosis not present

## 2019-10-21 DIAGNOSIS — R2689 Other abnormalities of gait and mobility: Secondary | ICD-10-CM | POA: Diagnosis not present

## 2019-10-21 DIAGNOSIS — R2681 Unsteadiness on feet: Secondary | ICD-10-CM | POA: Diagnosis not present

## 2019-10-21 DIAGNOSIS — M6281 Muscle weakness (generalized): Secondary | ICD-10-CM | POA: Diagnosis not present

## 2019-10-21 DIAGNOSIS — R278 Other lack of coordination: Secondary | ICD-10-CM | POA: Diagnosis not present

## 2019-10-22 DIAGNOSIS — Z1612 Extended spectrum beta lactamase (ESBL) resistance: Secondary | ICD-10-CM | POA: Diagnosis not present

## 2019-10-22 DIAGNOSIS — R1312 Dysphagia, oropharyngeal phase: Secondary | ICD-10-CM | POA: Diagnosis not present

## 2019-10-22 DIAGNOSIS — I613 Nontraumatic intracerebral hemorrhage in brain stem: Secondary | ICD-10-CM | POA: Diagnosis not present

## 2019-10-22 DIAGNOSIS — R339 Retention of urine, unspecified: Secondary | ICD-10-CM | POA: Diagnosis not present

## 2019-10-22 DIAGNOSIS — N390 Urinary tract infection, site not specified: Secondary | ICD-10-CM | POA: Diagnosis not present

## 2019-10-22 DIAGNOSIS — R2689 Other abnormalities of gait and mobility: Secondary | ICD-10-CM | POA: Diagnosis not present

## 2019-10-22 DIAGNOSIS — R278 Other lack of coordination: Secondary | ICD-10-CM | POA: Diagnosis not present

## 2019-10-22 DIAGNOSIS — M6281 Muscle weakness (generalized): Secondary | ICD-10-CM | POA: Diagnosis not present

## 2019-10-22 DIAGNOSIS — R2681 Unsteadiness on feet: Secondary | ICD-10-CM | POA: Diagnosis not present

## 2019-10-23 DIAGNOSIS — M6281 Muscle weakness (generalized): Secondary | ICD-10-CM | POA: Diagnosis not present

## 2019-10-23 DIAGNOSIS — R2681 Unsteadiness on feet: Secondary | ICD-10-CM | POA: Diagnosis not present

## 2019-10-23 DIAGNOSIS — R278 Other lack of coordination: Secondary | ICD-10-CM | POA: Diagnosis not present

## 2019-10-23 DIAGNOSIS — I613 Nontraumatic intracerebral hemorrhage in brain stem: Secondary | ICD-10-CM | POA: Diagnosis not present

## 2019-10-23 DIAGNOSIS — R1312 Dysphagia, oropharyngeal phase: Secondary | ICD-10-CM | POA: Diagnosis not present

## 2019-10-23 DIAGNOSIS — R2689 Other abnormalities of gait and mobility: Secondary | ICD-10-CM | POA: Diagnosis not present

## 2019-10-24 DIAGNOSIS — R2681 Unsteadiness on feet: Secondary | ICD-10-CM | POA: Diagnosis not present

## 2019-10-24 DIAGNOSIS — M6281 Muscle weakness (generalized): Secondary | ICD-10-CM | POA: Diagnosis not present

## 2019-10-24 DIAGNOSIS — R1312 Dysphagia, oropharyngeal phase: Secondary | ICD-10-CM | POA: Diagnosis not present

## 2019-10-24 DIAGNOSIS — R278 Other lack of coordination: Secondary | ICD-10-CM | POA: Diagnosis not present

## 2019-10-24 DIAGNOSIS — R2689 Other abnormalities of gait and mobility: Secondary | ICD-10-CM | POA: Diagnosis not present

## 2019-10-24 DIAGNOSIS — I613 Nontraumatic intracerebral hemorrhage in brain stem: Secondary | ICD-10-CM | POA: Diagnosis not present

## 2019-10-25 DIAGNOSIS — R1312 Dysphagia, oropharyngeal phase: Secondary | ICD-10-CM | POA: Diagnosis not present

## 2019-10-25 DIAGNOSIS — L89152 Pressure ulcer of sacral region, stage 2: Secondary | ICD-10-CM | POA: Diagnosis not present

## 2019-10-25 DIAGNOSIS — M6281 Muscle weakness (generalized): Secondary | ICD-10-CM | POA: Diagnosis not present

## 2019-10-25 DIAGNOSIS — R278 Other lack of coordination: Secondary | ICD-10-CM | POA: Diagnosis not present

## 2019-10-25 DIAGNOSIS — I613 Nontraumatic intracerebral hemorrhage in brain stem: Secondary | ICD-10-CM | POA: Diagnosis not present

## 2019-10-25 DIAGNOSIS — R2681 Unsteadiness on feet: Secondary | ICD-10-CM | POA: Diagnosis not present

## 2019-10-25 DIAGNOSIS — R2689 Other abnormalities of gait and mobility: Secondary | ICD-10-CM | POA: Diagnosis not present

## 2019-10-28 DIAGNOSIS — R2689 Other abnormalities of gait and mobility: Secondary | ICD-10-CM | POA: Diagnosis not present

## 2019-10-28 DIAGNOSIS — R278 Other lack of coordination: Secondary | ICD-10-CM | POA: Diagnosis not present

## 2019-10-28 DIAGNOSIS — M6281 Muscle weakness (generalized): Secondary | ICD-10-CM | POA: Diagnosis not present

## 2019-10-28 DIAGNOSIS — R1312 Dysphagia, oropharyngeal phase: Secondary | ICD-10-CM | POA: Diagnosis not present

## 2019-10-28 DIAGNOSIS — R2681 Unsteadiness on feet: Secondary | ICD-10-CM | POA: Diagnosis not present

## 2019-10-28 DIAGNOSIS — I613 Nontraumatic intracerebral hemorrhage in brain stem: Secondary | ICD-10-CM | POA: Diagnosis not present

## 2019-10-30 DIAGNOSIS — Z8619 Personal history of other infectious and parasitic diseases: Secondary | ICD-10-CM | POA: Diagnosis not present

## 2019-10-30 DIAGNOSIS — I613 Nontraumatic intracerebral hemorrhage in brain stem: Secondary | ICD-10-CM | POA: Diagnosis not present

## 2019-10-30 DIAGNOSIS — E876 Hypokalemia: Secondary | ICD-10-CM | POA: Diagnosis not present

## 2019-10-30 DIAGNOSIS — R2689 Other abnormalities of gait and mobility: Secondary | ICD-10-CM | POA: Diagnosis not present

## 2019-10-30 DIAGNOSIS — R2681 Unsteadiness on feet: Secondary | ICD-10-CM | POA: Diagnosis not present

## 2019-10-30 DIAGNOSIS — R6889 Other general symptoms and signs: Secondary | ICD-10-CM | POA: Diagnosis not present

## 2019-10-30 DIAGNOSIS — M6281 Muscle weakness (generalized): Secondary | ICD-10-CM | POA: Diagnosis not present

## 2019-10-30 DIAGNOSIS — R1312 Dysphagia, oropharyngeal phase: Secondary | ICD-10-CM | POA: Diagnosis not present

## 2019-10-30 DIAGNOSIS — R278 Other lack of coordination: Secondary | ICD-10-CM | POA: Diagnosis not present

## 2019-10-31 DIAGNOSIS — R278 Other lack of coordination: Secondary | ICD-10-CM | POA: Diagnosis not present

## 2019-10-31 DIAGNOSIS — I613 Nontraumatic intracerebral hemorrhage in brain stem: Secondary | ICD-10-CM | POA: Diagnosis not present

## 2019-10-31 DIAGNOSIS — R1312 Dysphagia, oropharyngeal phase: Secondary | ICD-10-CM | POA: Diagnosis not present

## 2019-10-31 DIAGNOSIS — R2689 Other abnormalities of gait and mobility: Secondary | ICD-10-CM | POA: Diagnosis not present

## 2019-10-31 DIAGNOSIS — R2681 Unsteadiness on feet: Secondary | ICD-10-CM | POA: Diagnosis not present

## 2019-10-31 DIAGNOSIS — M6281 Muscle weakness (generalized): Secondary | ICD-10-CM | POA: Diagnosis not present

## 2019-11-01 DIAGNOSIS — M6281 Muscle weakness (generalized): Secondary | ICD-10-CM | POA: Diagnosis not present

## 2019-11-01 DIAGNOSIS — R1312 Dysphagia, oropharyngeal phase: Secondary | ICD-10-CM | POA: Diagnosis not present

## 2019-11-01 DIAGNOSIS — I613 Nontraumatic intracerebral hemorrhage in brain stem: Secondary | ICD-10-CM | POA: Diagnosis not present

## 2019-11-01 DIAGNOSIS — R2689 Other abnormalities of gait and mobility: Secondary | ICD-10-CM | POA: Diagnosis not present

## 2019-11-01 DIAGNOSIS — N390 Urinary tract infection, site not specified: Secondary | ICD-10-CM | POA: Diagnosis not present

## 2019-11-01 DIAGNOSIS — R2681 Unsteadiness on feet: Secondary | ICD-10-CM | POA: Diagnosis not present

## 2019-11-01 DIAGNOSIS — R278 Other lack of coordination: Secondary | ICD-10-CM | POA: Diagnosis not present

## 2019-11-03 DIAGNOSIS — R2681 Unsteadiness on feet: Secondary | ICD-10-CM | POA: Diagnosis not present

## 2019-11-03 DIAGNOSIS — R278 Other lack of coordination: Secondary | ICD-10-CM | POA: Diagnosis not present

## 2019-11-03 DIAGNOSIS — M6281 Muscle weakness (generalized): Secondary | ICD-10-CM | POA: Diagnosis not present

## 2019-11-03 DIAGNOSIS — R1312 Dysphagia, oropharyngeal phase: Secondary | ICD-10-CM | POA: Diagnosis not present

## 2019-11-03 DIAGNOSIS — R2689 Other abnormalities of gait and mobility: Secondary | ICD-10-CM | POA: Diagnosis not present

## 2019-11-03 DIAGNOSIS — I613 Nontraumatic intracerebral hemorrhage in brain stem: Secondary | ICD-10-CM | POA: Diagnosis not present

## 2019-11-04 DIAGNOSIS — R278 Other lack of coordination: Secondary | ICD-10-CM | POA: Diagnosis not present

## 2019-11-04 DIAGNOSIS — I613 Nontraumatic intracerebral hemorrhage in brain stem: Secondary | ICD-10-CM | POA: Diagnosis not present

## 2019-11-04 DIAGNOSIS — R2681 Unsteadiness on feet: Secondary | ICD-10-CM | POA: Diagnosis not present

## 2019-11-04 DIAGNOSIS — R2689 Other abnormalities of gait and mobility: Secondary | ICD-10-CM | POA: Diagnosis not present

## 2019-11-04 DIAGNOSIS — R1312 Dysphagia, oropharyngeal phase: Secondary | ICD-10-CM | POA: Diagnosis not present

## 2019-11-04 DIAGNOSIS — M6281 Muscle weakness (generalized): Secondary | ICD-10-CM | POA: Diagnosis not present

## 2019-11-05 DIAGNOSIS — R278 Other lack of coordination: Secondary | ICD-10-CM | POA: Diagnosis not present

## 2019-11-05 DIAGNOSIS — R2689 Other abnormalities of gait and mobility: Secondary | ICD-10-CM | POA: Diagnosis not present

## 2019-11-05 DIAGNOSIS — R1312 Dysphagia, oropharyngeal phase: Secondary | ICD-10-CM | POA: Diagnosis not present

## 2019-11-05 DIAGNOSIS — I613 Nontraumatic intracerebral hemorrhage in brain stem: Secondary | ICD-10-CM | POA: Diagnosis not present

## 2019-11-05 DIAGNOSIS — M6281 Muscle weakness (generalized): Secondary | ICD-10-CM | POA: Diagnosis not present

## 2019-11-05 DIAGNOSIS — R2681 Unsteadiness on feet: Secondary | ICD-10-CM | POA: Diagnosis not present

## 2019-11-06 DIAGNOSIS — I613 Nontraumatic intracerebral hemorrhage in brain stem: Secondary | ICD-10-CM | POA: Diagnosis not present

## 2019-11-06 DIAGNOSIS — R1312 Dysphagia, oropharyngeal phase: Secondary | ICD-10-CM | POA: Diagnosis not present

## 2019-11-06 DIAGNOSIS — M6281 Muscle weakness (generalized): Secondary | ICD-10-CM | POA: Diagnosis not present

## 2019-11-06 DIAGNOSIS — R278 Other lack of coordination: Secondary | ICD-10-CM | POA: Diagnosis not present

## 2019-11-06 DIAGNOSIS — R2689 Other abnormalities of gait and mobility: Secondary | ICD-10-CM | POA: Diagnosis not present

## 2019-11-06 DIAGNOSIS — R2681 Unsteadiness on feet: Secondary | ICD-10-CM | POA: Diagnosis not present

## 2019-11-07 DIAGNOSIS — R1312 Dysphagia, oropharyngeal phase: Secondary | ICD-10-CM | POA: Diagnosis not present

## 2019-11-07 DIAGNOSIS — R2681 Unsteadiness on feet: Secondary | ICD-10-CM | POA: Diagnosis not present

## 2019-11-07 DIAGNOSIS — R2689 Other abnormalities of gait and mobility: Secondary | ICD-10-CM | POA: Diagnosis not present

## 2019-11-07 DIAGNOSIS — I613 Nontraumatic intracerebral hemorrhage in brain stem: Secondary | ICD-10-CM | POA: Diagnosis not present

## 2019-11-07 DIAGNOSIS — M6281 Muscle weakness (generalized): Secondary | ICD-10-CM | POA: Diagnosis not present

## 2019-11-07 DIAGNOSIS — R278 Other lack of coordination: Secondary | ICD-10-CM | POA: Diagnosis not present

## 2019-11-08 DIAGNOSIS — R2681 Unsteadiness on feet: Secondary | ICD-10-CM | POA: Diagnosis not present

## 2019-11-08 DIAGNOSIS — M6281 Muscle weakness (generalized): Secondary | ICD-10-CM | POA: Diagnosis not present

## 2019-11-08 DIAGNOSIS — I613 Nontraumatic intracerebral hemorrhage in brain stem: Secondary | ICD-10-CM | POA: Diagnosis not present

## 2019-11-08 DIAGNOSIS — Z79899 Other long term (current) drug therapy: Secondary | ICD-10-CM | POA: Diagnosis not present

## 2019-11-08 DIAGNOSIS — R1312 Dysphagia, oropharyngeal phase: Secondary | ICD-10-CM | POA: Diagnosis not present

## 2019-11-08 DIAGNOSIS — R278 Other lack of coordination: Secondary | ICD-10-CM | POA: Diagnosis not present

## 2019-11-08 DIAGNOSIS — N390 Urinary tract infection, site not specified: Secondary | ICD-10-CM | POA: Diagnosis not present

## 2019-11-08 DIAGNOSIS — R2689 Other abnormalities of gait and mobility: Secondary | ICD-10-CM | POA: Diagnosis not present

## 2019-11-11 DIAGNOSIS — R1312 Dysphagia, oropharyngeal phase: Secondary | ICD-10-CM | POA: Diagnosis not present

## 2019-11-11 DIAGNOSIS — R2681 Unsteadiness on feet: Secondary | ICD-10-CM | POA: Diagnosis not present

## 2019-11-11 DIAGNOSIS — M6281 Muscle weakness (generalized): Secondary | ICD-10-CM | POA: Diagnosis not present

## 2019-11-11 DIAGNOSIS — R2689 Other abnormalities of gait and mobility: Secondary | ICD-10-CM | POA: Diagnosis not present

## 2019-11-11 DIAGNOSIS — I613 Nontraumatic intracerebral hemorrhage in brain stem: Secondary | ICD-10-CM | POA: Diagnosis not present

## 2019-11-11 DIAGNOSIS — R278 Other lack of coordination: Secondary | ICD-10-CM | POA: Diagnosis not present

## 2019-11-12 DIAGNOSIS — R278 Other lack of coordination: Secondary | ICD-10-CM | POA: Diagnosis not present

## 2019-11-12 DIAGNOSIS — R2681 Unsteadiness on feet: Secondary | ICD-10-CM | POA: Diagnosis not present

## 2019-11-12 DIAGNOSIS — R1312 Dysphagia, oropharyngeal phase: Secondary | ICD-10-CM | POA: Diagnosis not present

## 2019-11-12 DIAGNOSIS — M6281 Muscle weakness (generalized): Secondary | ICD-10-CM | POA: Diagnosis not present

## 2019-11-12 DIAGNOSIS — I613 Nontraumatic intracerebral hemorrhage in brain stem: Secondary | ICD-10-CM | POA: Diagnosis not present

## 2019-11-12 DIAGNOSIS — R2689 Other abnormalities of gait and mobility: Secondary | ICD-10-CM | POA: Diagnosis not present

## 2019-11-13 DIAGNOSIS — R278 Other lack of coordination: Secondary | ICD-10-CM | POA: Diagnosis not present

## 2019-11-13 DIAGNOSIS — R2689 Other abnormalities of gait and mobility: Secondary | ICD-10-CM | POA: Diagnosis not present

## 2019-11-13 DIAGNOSIS — R1312 Dysphagia, oropharyngeal phase: Secondary | ICD-10-CM | POA: Diagnosis not present

## 2019-11-13 DIAGNOSIS — M6281 Muscle weakness (generalized): Secondary | ICD-10-CM | POA: Diagnosis not present

## 2019-11-13 DIAGNOSIS — I613 Nontraumatic intracerebral hemorrhage in brain stem: Secondary | ICD-10-CM | POA: Diagnosis not present

## 2019-11-13 DIAGNOSIS — R2681 Unsteadiness on feet: Secondary | ICD-10-CM | POA: Diagnosis not present

## 2019-11-14 DIAGNOSIS — N390 Urinary tract infection, site not specified: Secondary | ICD-10-CM | POA: Diagnosis not present

## 2019-11-14 DIAGNOSIS — R2681 Unsteadiness on feet: Secondary | ICD-10-CM | POA: Diagnosis not present

## 2019-11-14 DIAGNOSIS — N319 Neuromuscular dysfunction of bladder, unspecified: Secondary | ICD-10-CM | POA: Diagnosis not present

## 2019-11-14 DIAGNOSIS — R1312 Dysphagia, oropharyngeal phase: Secondary | ICD-10-CM | POA: Diagnosis not present

## 2019-11-14 DIAGNOSIS — I10 Essential (primary) hypertension: Secondary | ICD-10-CM | POA: Diagnosis not present

## 2019-11-14 DIAGNOSIS — I613 Nontraumatic intracerebral hemorrhage in brain stem: Secondary | ICD-10-CM | POA: Diagnosis not present

## 2019-11-14 DIAGNOSIS — M6281 Muscle weakness (generalized): Secondary | ICD-10-CM | POA: Diagnosis not present

## 2019-11-14 DIAGNOSIS — R2689 Other abnormalities of gait and mobility: Secondary | ICD-10-CM | POA: Diagnosis not present

## 2019-11-14 DIAGNOSIS — R278 Other lack of coordination: Secondary | ICD-10-CM | POA: Diagnosis not present

## 2019-11-14 DIAGNOSIS — Z1612 Extended spectrum beta lactamase (ESBL) resistance: Secondary | ICD-10-CM | POA: Diagnosis not present

## 2019-11-15 DIAGNOSIS — R2681 Unsteadiness on feet: Secondary | ICD-10-CM | POA: Diagnosis not present

## 2019-11-15 DIAGNOSIS — R1312 Dysphagia, oropharyngeal phase: Secondary | ICD-10-CM | POA: Diagnosis not present

## 2019-11-15 DIAGNOSIS — M6281 Muscle weakness (generalized): Secondary | ICD-10-CM | POA: Diagnosis not present

## 2019-11-15 DIAGNOSIS — R278 Other lack of coordination: Secondary | ICD-10-CM | POA: Diagnosis not present

## 2019-11-15 DIAGNOSIS — R2689 Other abnormalities of gait and mobility: Secondary | ICD-10-CM | POA: Diagnosis not present

## 2019-11-15 DIAGNOSIS — I613 Nontraumatic intracerebral hemorrhage in brain stem: Secondary | ICD-10-CM | POA: Diagnosis not present

## 2019-11-16 DIAGNOSIS — I613 Nontraumatic intracerebral hemorrhage in brain stem: Secondary | ICD-10-CM | POA: Diagnosis not present

## 2019-11-16 DIAGNOSIS — R278 Other lack of coordination: Secondary | ICD-10-CM | POA: Diagnosis not present

## 2019-11-16 DIAGNOSIS — R2681 Unsteadiness on feet: Secondary | ICD-10-CM | POA: Diagnosis not present

## 2019-11-16 DIAGNOSIS — R2689 Other abnormalities of gait and mobility: Secondary | ICD-10-CM | POA: Diagnosis not present

## 2019-11-16 DIAGNOSIS — R1312 Dysphagia, oropharyngeal phase: Secondary | ICD-10-CM | POA: Diagnosis not present

## 2019-11-16 DIAGNOSIS — M6281 Muscle weakness (generalized): Secondary | ICD-10-CM | POA: Diagnosis not present

## 2019-11-18 DIAGNOSIS — R2681 Unsteadiness on feet: Secondary | ICD-10-CM | POA: Diagnosis not present

## 2019-11-18 DIAGNOSIS — R1312 Dysphagia, oropharyngeal phase: Secondary | ICD-10-CM | POA: Diagnosis not present

## 2019-11-18 DIAGNOSIS — M6281 Muscle weakness (generalized): Secondary | ICD-10-CM | POA: Diagnosis not present

## 2019-11-18 DIAGNOSIS — I613 Nontraumatic intracerebral hemorrhage in brain stem: Secondary | ICD-10-CM | POA: Diagnosis not present

## 2019-11-18 DIAGNOSIS — R2689 Other abnormalities of gait and mobility: Secondary | ICD-10-CM | POA: Diagnosis not present

## 2019-11-18 DIAGNOSIS — R278 Other lack of coordination: Secondary | ICD-10-CM | POA: Diagnosis not present

## 2019-11-19 DIAGNOSIS — M6281 Muscle weakness (generalized): Secondary | ICD-10-CM | POA: Diagnosis not present

## 2019-11-19 DIAGNOSIS — I613 Nontraumatic intracerebral hemorrhage in brain stem: Secondary | ICD-10-CM | POA: Diagnosis not present

## 2019-11-19 DIAGNOSIS — R2689 Other abnormalities of gait and mobility: Secondary | ICD-10-CM | POA: Diagnosis not present

## 2019-11-19 DIAGNOSIS — R1312 Dysphagia, oropharyngeal phase: Secondary | ICD-10-CM | POA: Diagnosis not present

## 2019-11-19 DIAGNOSIS — R2681 Unsteadiness on feet: Secondary | ICD-10-CM | POA: Diagnosis not present

## 2019-11-19 DIAGNOSIS — R278 Other lack of coordination: Secondary | ICD-10-CM | POA: Diagnosis not present

## 2019-11-20 DIAGNOSIS — M6281 Muscle weakness (generalized): Secondary | ICD-10-CM | POA: Diagnosis not present

## 2019-11-20 DIAGNOSIS — R2689 Other abnormalities of gait and mobility: Secondary | ICD-10-CM | POA: Diagnosis not present

## 2019-11-20 DIAGNOSIS — I613 Nontraumatic intracerebral hemorrhage in brain stem: Secondary | ICD-10-CM | POA: Diagnosis not present

## 2019-11-20 DIAGNOSIS — R2681 Unsteadiness on feet: Secondary | ICD-10-CM | POA: Diagnosis not present

## 2019-11-20 DIAGNOSIS — R1312 Dysphagia, oropharyngeal phase: Secondary | ICD-10-CM | POA: Diagnosis not present

## 2019-11-20 DIAGNOSIS — R278 Other lack of coordination: Secondary | ICD-10-CM | POA: Diagnosis not present

## 2019-11-21 DIAGNOSIS — M6281 Muscle weakness (generalized): Secondary | ICD-10-CM | POA: Diagnosis not present

## 2019-11-21 DIAGNOSIS — R1312 Dysphagia, oropharyngeal phase: Secondary | ICD-10-CM | POA: Diagnosis not present

## 2019-11-21 DIAGNOSIS — R2689 Other abnormalities of gait and mobility: Secondary | ICD-10-CM | POA: Diagnosis not present

## 2019-11-21 DIAGNOSIS — R278 Other lack of coordination: Secondary | ICD-10-CM | POA: Diagnosis not present

## 2019-11-21 DIAGNOSIS — R2681 Unsteadiness on feet: Secondary | ICD-10-CM | POA: Diagnosis not present

## 2019-11-21 DIAGNOSIS — I613 Nontraumatic intracerebral hemorrhage in brain stem: Secondary | ICD-10-CM | POA: Diagnosis not present

## 2019-11-22 DIAGNOSIS — M6281 Muscle weakness (generalized): Secondary | ICD-10-CM | POA: Diagnosis not present

## 2019-11-22 DIAGNOSIS — R2689 Other abnormalities of gait and mobility: Secondary | ICD-10-CM | POA: Diagnosis not present

## 2019-11-22 DIAGNOSIS — R278 Other lack of coordination: Secondary | ICD-10-CM | POA: Diagnosis not present

## 2019-11-22 DIAGNOSIS — R1312 Dysphagia, oropharyngeal phase: Secondary | ICD-10-CM | POA: Diagnosis not present

## 2019-11-22 DIAGNOSIS — R2681 Unsteadiness on feet: Secondary | ICD-10-CM | POA: Diagnosis not present

## 2019-11-22 DIAGNOSIS — I613 Nontraumatic intracerebral hemorrhage in brain stem: Secondary | ICD-10-CM | POA: Diagnosis not present

## 2019-11-23 DIAGNOSIS — R2681 Unsteadiness on feet: Secondary | ICD-10-CM | POA: Diagnosis not present

## 2019-11-23 DIAGNOSIS — M6281 Muscle weakness (generalized): Secondary | ICD-10-CM | POA: Diagnosis not present

## 2019-11-23 DIAGNOSIS — R1312 Dysphagia, oropharyngeal phase: Secondary | ICD-10-CM | POA: Diagnosis not present

## 2019-11-23 DIAGNOSIS — R2689 Other abnormalities of gait and mobility: Secondary | ICD-10-CM | POA: Diagnosis not present

## 2019-11-23 DIAGNOSIS — R278 Other lack of coordination: Secondary | ICD-10-CM | POA: Diagnosis not present

## 2019-11-23 DIAGNOSIS — I613 Nontraumatic intracerebral hemorrhage in brain stem: Secondary | ICD-10-CM | POA: Diagnosis not present

## 2019-11-25 DIAGNOSIS — M6281 Muscle weakness (generalized): Secondary | ICD-10-CM | POA: Diagnosis not present

## 2019-11-25 DIAGNOSIS — R2689 Other abnormalities of gait and mobility: Secondary | ICD-10-CM | POA: Diagnosis not present

## 2019-11-25 DIAGNOSIS — R278 Other lack of coordination: Secondary | ICD-10-CM | POA: Diagnosis not present

## 2019-11-25 DIAGNOSIS — R2681 Unsteadiness on feet: Secondary | ICD-10-CM | POA: Diagnosis not present

## 2019-11-25 DIAGNOSIS — I613 Nontraumatic intracerebral hemorrhage in brain stem: Secondary | ICD-10-CM | POA: Diagnosis not present

## 2019-11-25 DIAGNOSIS — R1312 Dysphagia, oropharyngeal phase: Secondary | ICD-10-CM | POA: Diagnosis not present

## 2019-11-26 DIAGNOSIS — R2681 Unsteadiness on feet: Secondary | ICD-10-CM | POA: Diagnosis not present

## 2019-11-26 DIAGNOSIS — R1312 Dysphagia, oropharyngeal phase: Secondary | ICD-10-CM | POA: Diagnosis not present

## 2019-11-26 DIAGNOSIS — R278 Other lack of coordination: Secondary | ICD-10-CM | POA: Diagnosis not present

## 2019-11-26 DIAGNOSIS — R2689 Other abnormalities of gait and mobility: Secondary | ICD-10-CM | POA: Diagnosis not present

## 2019-11-26 DIAGNOSIS — I613 Nontraumatic intracerebral hemorrhage in brain stem: Secondary | ICD-10-CM | POA: Diagnosis not present

## 2019-11-26 DIAGNOSIS — M6281 Muscle weakness (generalized): Secondary | ICD-10-CM | POA: Diagnosis not present

## 2019-11-27 DIAGNOSIS — M6281 Muscle weakness (generalized): Secondary | ICD-10-CM | POA: Diagnosis not present

## 2019-11-27 DIAGNOSIS — R278 Other lack of coordination: Secondary | ICD-10-CM | POA: Diagnosis not present

## 2019-11-27 DIAGNOSIS — R2681 Unsteadiness on feet: Secondary | ICD-10-CM | POA: Diagnosis not present

## 2019-11-27 DIAGNOSIS — I613 Nontraumatic intracerebral hemorrhage in brain stem: Secondary | ICD-10-CM | POA: Diagnosis not present

## 2019-11-27 DIAGNOSIS — R1312 Dysphagia, oropharyngeal phase: Secondary | ICD-10-CM | POA: Diagnosis not present

## 2019-11-27 DIAGNOSIS — R2689 Other abnormalities of gait and mobility: Secondary | ICD-10-CM | POA: Diagnosis not present

## 2019-11-28 DIAGNOSIS — R1312 Dysphagia, oropharyngeal phase: Secondary | ICD-10-CM | POA: Diagnosis not present

## 2019-11-28 DIAGNOSIS — M6281 Muscle weakness (generalized): Secondary | ICD-10-CM | POA: Diagnosis not present

## 2019-11-28 DIAGNOSIS — I613 Nontraumatic intracerebral hemorrhage in brain stem: Secondary | ICD-10-CM | POA: Diagnosis not present

## 2019-11-28 DIAGNOSIS — R2689 Other abnormalities of gait and mobility: Secondary | ICD-10-CM | POA: Diagnosis not present

## 2019-11-28 DIAGNOSIS — R2681 Unsteadiness on feet: Secondary | ICD-10-CM | POA: Diagnosis not present

## 2019-11-28 DIAGNOSIS — R278 Other lack of coordination: Secondary | ICD-10-CM | POA: Diagnosis not present

## 2019-11-29 DIAGNOSIS — R2689 Other abnormalities of gait and mobility: Secondary | ICD-10-CM | POA: Diagnosis not present

## 2019-11-29 DIAGNOSIS — I613 Nontraumatic intracerebral hemorrhage in brain stem: Secondary | ICD-10-CM | POA: Diagnosis not present

## 2019-11-29 DIAGNOSIS — M6281 Muscle weakness (generalized): Secondary | ICD-10-CM | POA: Diagnosis not present

## 2019-11-29 DIAGNOSIS — R278 Other lack of coordination: Secondary | ICD-10-CM | POA: Diagnosis not present

## 2019-11-29 DIAGNOSIS — R2681 Unsteadiness on feet: Secondary | ICD-10-CM | POA: Diagnosis not present

## 2019-11-29 DIAGNOSIS — R1312 Dysphagia, oropharyngeal phase: Secondary | ICD-10-CM | POA: Diagnosis not present

## 2019-11-30 DIAGNOSIS — R1312 Dysphagia, oropharyngeal phase: Secondary | ICD-10-CM | POA: Diagnosis not present

## 2019-11-30 DIAGNOSIS — R2681 Unsteadiness on feet: Secondary | ICD-10-CM | POA: Diagnosis not present

## 2019-11-30 DIAGNOSIS — R2689 Other abnormalities of gait and mobility: Secondary | ICD-10-CM | POA: Diagnosis not present

## 2019-11-30 DIAGNOSIS — M6281 Muscle weakness (generalized): Secondary | ICD-10-CM | POA: Diagnosis not present

## 2019-11-30 DIAGNOSIS — R278 Other lack of coordination: Secondary | ICD-10-CM | POA: Diagnosis not present

## 2019-11-30 DIAGNOSIS — I613 Nontraumatic intracerebral hemorrhage in brain stem: Secondary | ICD-10-CM | POA: Diagnosis not present

## 2019-12-02 DIAGNOSIS — R2689 Other abnormalities of gait and mobility: Secondary | ICD-10-CM | POA: Diagnosis not present

## 2019-12-02 DIAGNOSIS — R2681 Unsteadiness on feet: Secondary | ICD-10-CM | POA: Diagnosis not present

## 2019-12-02 DIAGNOSIS — M6281 Muscle weakness (generalized): Secondary | ICD-10-CM | POA: Diagnosis not present

## 2019-12-02 DIAGNOSIS — I613 Nontraumatic intracerebral hemorrhage in brain stem: Secondary | ICD-10-CM | POA: Diagnosis not present

## 2019-12-02 DIAGNOSIS — R278 Other lack of coordination: Secondary | ICD-10-CM | POA: Diagnosis not present

## 2019-12-02 DIAGNOSIS — R1312 Dysphagia, oropharyngeal phase: Secondary | ICD-10-CM | POA: Diagnosis not present

## 2019-12-03 DIAGNOSIS — R1312 Dysphagia, oropharyngeal phase: Secondary | ICD-10-CM | POA: Diagnosis not present

## 2019-12-03 DIAGNOSIS — R278 Other lack of coordination: Secondary | ICD-10-CM | POA: Diagnosis not present

## 2019-12-03 DIAGNOSIS — I613 Nontraumatic intracerebral hemorrhage in brain stem: Secondary | ICD-10-CM | POA: Diagnosis not present

## 2019-12-03 DIAGNOSIS — M6281 Muscle weakness (generalized): Secondary | ICD-10-CM | POA: Diagnosis not present

## 2019-12-03 DIAGNOSIS — R2681 Unsteadiness on feet: Secondary | ICD-10-CM | POA: Diagnosis not present

## 2019-12-03 DIAGNOSIS — R2689 Other abnormalities of gait and mobility: Secondary | ICD-10-CM | POA: Diagnosis not present

## 2019-12-04 DIAGNOSIS — M6281 Muscle weakness (generalized): Secondary | ICD-10-CM | POA: Diagnosis not present

## 2019-12-04 DIAGNOSIS — R1312 Dysphagia, oropharyngeal phase: Secondary | ICD-10-CM | POA: Diagnosis not present

## 2019-12-04 DIAGNOSIS — R2681 Unsteadiness on feet: Secondary | ICD-10-CM | POA: Diagnosis not present

## 2019-12-04 DIAGNOSIS — R2689 Other abnormalities of gait and mobility: Secondary | ICD-10-CM | POA: Diagnosis not present

## 2019-12-04 DIAGNOSIS — I613 Nontraumatic intracerebral hemorrhage in brain stem: Secondary | ICD-10-CM | POA: Diagnosis not present

## 2019-12-04 DIAGNOSIS — R278 Other lack of coordination: Secondary | ICD-10-CM | POA: Diagnosis not present

## 2019-12-05 DIAGNOSIS — R278 Other lack of coordination: Secondary | ICD-10-CM | POA: Diagnosis not present

## 2019-12-05 DIAGNOSIS — R2689 Other abnormalities of gait and mobility: Secondary | ICD-10-CM | POA: Diagnosis not present

## 2019-12-05 DIAGNOSIS — I613 Nontraumatic intracerebral hemorrhage in brain stem: Secondary | ICD-10-CM | POA: Diagnosis not present

## 2019-12-05 DIAGNOSIS — R2681 Unsteadiness on feet: Secondary | ICD-10-CM | POA: Diagnosis not present

## 2019-12-05 DIAGNOSIS — M6281 Muscle weakness (generalized): Secondary | ICD-10-CM | POA: Diagnosis not present

## 2019-12-05 DIAGNOSIS — R1312 Dysphagia, oropharyngeal phase: Secondary | ICD-10-CM | POA: Diagnosis not present

## 2019-12-06 DIAGNOSIS — R2689 Other abnormalities of gait and mobility: Secondary | ICD-10-CM | POA: Diagnosis not present

## 2019-12-06 DIAGNOSIS — R2681 Unsteadiness on feet: Secondary | ICD-10-CM | POA: Diagnosis not present

## 2019-12-06 DIAGNOSIS — R1312 Dysphagia, oropharyngeal phase: Secondary | ICD-10-CM | POA: Diagnosis not present

## 2019-12-06 DIAGNOSIS — I613 Nontraumatic intracerebral hemorrhage in brain stem: Secondary | ICD-10-CM | POA: Diagnosis not present

## 2019-12-06 DIAGNOSIS — M6281 Muscle weakness (generalized): Secondary | ICD-10-CM | POA: Diagnosis not present

## 2019-12-06 DIAGNOSIS — R278 Other lack of coordination: Secondary | ICD-10-CM | POA: Diagnosis not present

## 2019-12-07 DIAGNOSIS — R1312 Dysphagia, oropharyngeal phase: Secondary | ICD-10-CM | POA: Diagnosis not present

## 2019-12-07 DIAGNOSIS — I613 Nontraumatic intracerebral hemorrhage in brain stem: Secondary | ICD-10-CM | POA: Diagnosis not present

## 2019-12-07 DIAGNOSIS — R278 Other lack of coordination: Secondary | ICD-10-CM | POA: Diagnosis not present

## 2019-12-07 DIAGNOSIS — R2681 Unsteadiness on feet: Secondary | ICD-10-CM | POA: Diagnosis not present

## 2019-12-07 DIAGNOSIS — M6281 Muscle weakness (generalized): Secondary | ICD-10-CM | POA: Diagnosis not present

## 2019-12-07 DIAGNOSIS — R2689 Other abnormalities of gait and mobility: Secondary | ICD-10-CM | POA: Diagnosis not present

## 2019-12-09 DIAGNOSIS — I613 Nontraumatic intracerebral hemorrhage in brain stem: Secondary | ICD-10-CM | POA: Diagnosis not present

## 2019-12-09 DIAGNOSIS — M6281 Muscle weakness (generalized): Secondary | ICD-10-CM | POA: Diagnosis not present

## 2019-12-09 DIAGNOSIS — R278 Other lack of coordination: Secondary | ICD-10-CM | POA: Diagnosis not present

## 2019-12-09 DIAGNOSIS — R1312 Dysphagia, oropharyngeal phase: Secondary | ICD-10-CM | POA: Diagnosis not present

## 2019-12-09 DIAGNOSIS — R2689 Other abnormalities of gait and mobility: Secondary | ICD-10-CM | POA: Diagnosis not present

## 2019-12-09 DIAGNOSIS — R2681 Unsteadiness on feet: Secondary | ICD-10-CM | POA: Diagnosis not present

## 2019-12-10 DIAGNOSIS — R1312 Dysphagia, oropharyngeal phase: Secondary | ICD-10-CM | POA: Diagnosis not present

## 2019-12-10 DIAGNOSIS — R2681 Unsteadiness on feet: Secondary | ICD-10-CM | POA: Diagnosis not present

## 2019-12-10 DIAGNOSIS — R278 Other lack of coordination: Secondary | ICD-10-CM | POA: Diagnosis not present

## 2019-12-10 DIAGNOSIS — M6281 Muscle weakness (generalized): Secondary | ICD-10-CM | POA: Diagnosis not present

## 2019-12-10 DIAGNOSIS — I613 Nontraumatic intracerebral hemorrhage in brain stem: Secondary | ICD-10-CM | POA: Diagnosis not present

## 2019-12-10 DIAGNOSIS — R2689 Other abnormalities of gait and mobility: Secondary | ICD-10-CM | POA: Diagnosis not present

## 2019-12-11 DIAGNOSIS — R2681 Unsteadiness on feet: Secondary | ICD-10-CM | POA: Diagnosis not present

## 2019-12-11 DIAGNOSIS — R278 Other lack of coordination: Secondary | ICD-10-CM | POA: Diagnosis not present

## 2019-12-11 DIAGNOSIS — R2689 Other abnormalities of gait and mobility: Secondary | ICD-10-CM | POA: Diagnosis not present

## 2019-12-11 DIAGNOSIS — R1312 Dysphagia, oropharyngeal phase: Secondary | ICD-10-CM | POA: Diagnosis not present

## 2019-12-11 DIAGNOSIS — M6281 Muscle weakness (generalized): Secondary | ICD-10-CM | POA: Diagnosis not present

## 2019-12-11 DIAGNOSIS — I613 Nontraumatic intracerebral hemorrhage in brain stem: Secondary | ICD-10-CM | POA: Diagnosis not present

## 2019-12-12 DIAGNOSIS — R1312 Dysphagia, oropharyngeal phase: Secondary | ICD-10-CM | POA: Diagnosis not present

## 2019-12-12 DIAGNOSIS — R2689 Other abnormalities of gait and mobility: Secondary | ICD-10-CM | POA: Diagnosis not present

## 2019-12-12 DIAGNOSIS — N319 Neuromuscular dysfunction of bladder, unspecified: Secondary | ICD-10-CM | POA: Diagnosis not present

## 2019-12-12 DIAGNOSIS — M6281 Muscle weakness (generalized): Secondary | ICD-10-CM | POA: Diagnosis not present

## 2019-12-12 DIAGNOSIS — R2681 Unsteadiness on feet: Secondary | ICD-10-CM | POA: Diagnosis not present

## 2019-12-12 DIAGNOSIS — I613 Nontraumatic intracerebral hemorrhage in brain stem: Secondary | ICD-10-CM | POA: Diagnosis not present

## 2019-12-12 DIAGNOSIS — R278 Other lack of coordination: Secondary | ICD-10-CM | POA: Diagnosis not present

## 2019-12-13 DIAGNOSIS — R2689 Other abnormalities of gait and mobility: Secondary | ICD-10-CM | POA: Diagnosis not present

## 2019-12-13 DIAGNOSIS — M6281 Muscle weakness (generalized): Secondary | ICD-10-CM | POA: Diagnosis not present

## 2019-12-13 DIAGNOSIS — I613 Nontraumatic intracerebral hemorrhage in brain stem: Secondary | ICD-10-CM | POA: Diagnosis not present

## 2019-12-13 DIAGNOSIS — R1312 Dysphagia, oropharyngeal phase: Secondary | ICD-10-CM | POA: Diagnosis not present

## 2019-12-13 DIAGNOSIS — R2681 Unsteadiness on feet: Secondary | ICD-10-CM | POA: Diagnosis not present

## 2019-12-13 DIAGNOSIS — R278 Other lack of coordination: Secondary | ICD-10-CM | POA: Diagnosis not present

## 2019-12-14 DIAGNOSIS — R1312 Dysphagia, oropharyngeal phase: Secondary | ICD-10-CM | POA: Diagnosis not present

## 2019-12-14 DIAGNOSIS — M6281 Muscle weakness (generalized): Secondary | ICD-10-CM | POA: Diagnosis not present

## 2019-12-14 DIAGNOSIS — R2689 Other abnormalities of gait and mobility: Secondary | ICD-10-CM | POA: Diagnosis not present

## 2019-12-14 DIAGNOSIS — R278 Other lack of coordination: Secondary | ICD-10-CM | POA: Diagnosis not present

## 2019-12-14 DIAGNOSIS — I613 Nontraumatic intracerebral hemorrhage in brain stem: Secondary | ICD-10-CM | POA: Diagnosis not present

## 2019-12-14 DIAGNOSIS — R2681 Unsteadiness on feet: Secondary | ICD-10-CM | POA: Diagnosis not present

## 2019-12-16 DIAGNOSIS — R2681 Unsteadiness on feet: Secondary | ICD-10-CM | POA: Diagnosis not present

## 2019-12-16 DIAGNOSIS — M6281 Muscle weakness (generalized): Secondary | ICD-10-CM | POA: Diagnosis not present

## 2019-12-16 DIAGNOSIS — R278 Other lack of coordination: Secondary | ICD-10-CM | POA: Diagnosis not present

## 2019-12-16 DIAGNOSIS — I613 Nontraumatic intracerebral hemorrhage in brain stem: Secondary | ICD-10-CM | POA: Diagnosis not present

## 2019-12-16 DIAGNOSIS — R1312 Dysphagia, oropharyngeal phase: Secondary | ICD-10-CM | POA: Diagnosis not present

## 2019-12-16 DIAGNOSIS — R2689 Other abnormalities of gait and mobility: Secondary | ICD-10-CM | POA: Diagnosis not present

## 2019-12-17 DIAGNOSIS — R1312 Dysphagia, oropharyngeal phase: Secondary | ICD-10-CM | POA: Diagnosis not present

## 2019-12-17 DIAGNOSIS — R2681 Unsteadiness on feet: Secondary | ICD-10-CM | POA: Diagnosis not present

## 2019-12-17 DIAGNOSIS — M6281 Muscle weakness (generalized): Secondary | ICD-10-CM | POA: Diagnosis not present

## 2019-12-17 DIAGNOSIS — R278 Other lack of coordination: Secondary | ICD-10-CM | POA: Diagnosis not present

## 2019-12-17 DIAGNOSIS — R2689 Other abnormalities of gait and mobility: Secondary | ICD-10-CM | POA: Diagnosis not present

## 2019-12-17 DIAGNOSIS — I613 Nontraumatic intracerebral hemorrhage in brain stem: Secondary | ICD-10-CM | POA: Diagnosis not present

## 2019-12-18 DIAGNOSIS — R278 Other lack of coordination: Secondary | ICD-10-CM | POA: Diagnosis not present

## 2019-12-18 DIAGNOSIS — I613 Nontraumatic intracerebral hemorrhage in brain stem: Secondary | ICD-10-CM | POA: Diagnosis not present

## 2019-12-18 DIAGNOSIS — R2689 Other abnormalities of gait and mobility: Secondary | ICD-10-CM | POA: Diagnosis not present

## 2019-12-18 DIAGNOSIS — R2681 Unsteadiness on feet: Secondary | ICD-10-CM | POA: Diagnosis not present

## 2019-12-18 DIAGNOSIS — M6281 Muscle weakness (generalized): Secondary | ICD-10-CM | POA: Diagnosis not present

## 2019-12-18 DIAGNOSIS — R1312 Dysphagia, oropharyngeal phase: Secondary | ICD-10-CM | POA: Diagnosis not present

## 2019-12-19 DIAGNOSIS — M6281 Muscle weakness (generalized): Secondary | ICD-10-CM | POA: Diagnosis not present

## 2019-12-19 DIAGNOSIS — R278 Other lack of coordination: Secondary | ICD-10-CM | POA: Diagnosis not present

## 2019-12-19 DIAGNOSIS — R1312 Dysphagia, oropharyngeal phase: Secondary | ICD-10-CM | POA: Diagnosis not present

## 2019-12-19 DIAGNOSIS — R2689 Other abnormalities of gait and mobility: Secondary | ICD-10-CM | POA: Diagnosis not present

## 2019-12-19 DIAGNOSIS — R2681 Unsteadiness on feet: Secondary | ICD-10-CM | POA: Diagnosis not present

## 2019-12-19 DIAGNOSIS — I613 Nontraumatic intracerebral hemorrhage in brain stem: Secondary | ICD-10-CM | POA: Diagnosis not present

## 2019-12-20 DIAGNOSIS — R278 Other lack of coordination: Secondary | ICD-10-CM | POA: Diagnosis not present

## 2019-12-20 DIAGNOSIS — I613 Nontraumatic intracerebral hemorrhage in brain stem: Secondary | ICD-10-CM | POA: Diagnosis not present

## 2019-12-20 DIAGNOSIS — R2689 Other abnormalities of gait and mobility: Secondary | ICD-10-CM | POA: Diagnosis not present

## 2019-12-20 DIAGNOSIS — M6281 Muscle weakness (generalized): Secondary | ICD-10-CM | POA: Diagnosis not present

## 2019-12-20 DIAGNOSIS — R1312 Dysphagia, oropharyngeal phase: Secondary | ICD-10-CM | POA: Diagnosis not present

## 2019-12-20 DIAGNOSIS — R2681 Unsteadiness on feet: Secondary | ICD-10-CM | POA: Diagnosis not present

## 2019-12-21 DIAGNOSIS — R278 Other lack of coordination: Secondary | ICD-10-CM | POA: Diagnosis not present

## 2019-12-21 DIAGNOSIS — R2681 Unsteadiness on feet: Secondary | ICD-10-CM | POA: Diagnosis not present

## 2019-12-21 DIAGNOSIS — R2689 Other abnormalities of gait and mobility: Secondary | ICD-10-CM | POA: Diagnosis not present

## 2019-12-21 DIAGNOSIS — R1312 Dysphagia, oropharyngeal phase: Secondary | ICD-10-CM | POA: Diagnosis not present

## 2019-12-21 DIAGNOSIS — M6281 Muscle weakness (generalized): Secondary | ICD-10-CM | POA: Diagnosis not present

## 2019-12-21 DIAGNOSIS — I613 Nontraumatic intracerebral hemorrhage in brain stem: Secondary | ICD-10-CM | POA: Diagnosis not present

## 2019-12-23 DIAGNOSIS — R2681 Unsteadiness on feet: Secondary | ICD-10-CM | POA: Diagnosis not present

## 2019-12-23 DIAGNOSIS — R1312 Dysphagia, oropharyngeal phase: Secondary | ICD-10-CM | POA: Diagnosis not present

## 2019-12-23 DIAGNOSIS — R278 Other lack of coordination: Secondary | ICD-10-CM | POA: Diagnosis not present

## 2019-12-23 DIAGNOSIS — M6281 Muscle weakness (generalized): Secondary | ICD-10-CM | POA: Diagnosis not present

## 2019-12-23 DIAGNOSIS — I613 Nontraumatic intracerebral hemorrhage in brain stem: Secondary | ICD-10-CM | POA: Diagnosis not present

## 2019-12-23 DIAGNOSIS — R2689 Other abnormalities of gait and mobility: Secondary | ICD-10-CM | POA: Diagnosis not present

## 2019-12-24 DIAGNOSIS — R2681 Unsteadiness on feet: Secondary | ICD-10-CM | POA: Diagnosis not present

## 2019-12-24 DIAGNOSIS — R1312 Dysphagia, oropharyngeal phase: Secondary | ICD-10-CM | POA: Diagnosis not present

## 2019-12-24 DIAGNOSIS — R278 Other lack of coordination: Secondary | ICD-10-CM | POA: Diagnosis not present

## 2019-12-24 DIAGNOSIS — R2689 Other abnormalities of gait and mobility: Secondary | ICD-10-CM | POA: Diagnosis not present

## 2019-12-24 DIAGNOSIS — M6281 Muscle weakness (generalized): Secondary | ICD-10-CM | POA: Diagnosis not present

## 2019-12-24 DIAGNOSIS — N390 Urinary tract infection, site not specified: Secondary | ICD-10-CM | POA: Diagnosis not present

## 2019-12-24 DIAGNOSIS — I613 Nontraumatic intracerebral hemorrhage in brain stem: Secondary | ICD-10-CM | POA: Diagnosis not present

## 2019-12-24 DIAGNOSIS — R262 Difficulty in walking, not elsewhere classified: Secondary | ICD-10-CM | POA: Diagnosis not present

## 2019-12-24 DIAGNOSIS — R338 Other retention of urine: Secondary | ICD-10-CM | POA: Diagnosis not present

## 2019-12-25 DIAGNOSIS — R2681 Unsteadiness on feet: Secondary | ICD-10-CM | POA: Diagnosis not present

## 2019-12-25 DIAGNOSIS — R1312 Dysphagia, oropharyngeal phase: Secondary | ICD-10-CM | POA: Diagnosis not present

## 2019-12-25 DIAGNOSIS — M6281 Muscle weakness (generalized): Secondary | ICD-10-CM | POA: Diagnosis not present

## 2019-12-25 DIAGNOSIS — I613 Nontraumatic intracerebral hemorrhage in brain stem: Secondary | ICD-10-CM | POA: Diagnosis not present

## 2019-12-25 DIAGNOSIS — R2689 Other abnormalities of gait and mobility: Secondary | ICD-10-CM | POA: Diagnosis not present

## 2019-12-25 DIAGNOSIS — R278 Other lack of coordination: Secondary | ICD-10-CM | POA: Diagnosis not present

## 2019-12-26 DIAGNOSIS — M6281 Muscle weakness (generalized): Secondary | ICD-10-CM | POA: Diagnosis not present

## 2019-12-26 DIAGNOSIS — R1312 Dysphagia, oropharyngeal phase: Secondary | ICD-10-CM | POA: Diagnosis not present

## 2019-12-26 DIAGNOSIS — R2681 Unsteadiness on feet: Secondary | ICD-10-CM | POA: Diagnosis not present

## 2019-12-26 DIAGNOSIS — R278 Other lack of coordination: Secondary | ICD-10-CM | POA: Diagnosis not present

## 2019-12-26 DIAGNOSIS — R2689 Other abnormalities of gait and mobility: Secondary | ICD-10-CM | POA: Diagnosis not present

## 2019-12-26 DIAGNOSIS — I613 Nontraumatic intracerebral hemorrhage in brain stem: Secondary | ICD-10-CM | POA: Diagnosis not present

## 2019-12-27 DIAGNOSIS — R1312 Dysphagia, oropharyngeal phase: Secondary | ICD-10-CM | POA: Diagnosis not present

## 2019-12-27 DIAGNOSIS — R2689 Other abnormalities of gait and mobility: Secondary | ICD-10-CM | POA: Diagnosis not present

## 2019-12-27 DIAGNOSIS — Z20822 Contact with and (suspected) exposure to covid-19: Secondary | ICD-10-CM | POA: Diagnosis not present

## 2019-12-27 DIAGNOSIS — R2681 Unsteadiness on feet: Secondary | ICD-10-CM | POA: Diagnosis not present

## 2019-12-27 DIAGNOSIS — R278 Other lack of coordination: Secondary | ICD-10-CM | POA: Diagnosis not present

## 2019-12-27 DIAGNOSIS — M6281 Muscle weakness (generalized): Secondary | ICD-10-CM | POA: Diagnosis not present

## 2019-12-27 DIAGNOSIS — I613 Nontraumatic intracerebral hemorrhage in brain stem: Secondary | ICD-10-CM | POA: Diagnosis not present

## 2019-12-28 DIAGNOSIS — R2681 Unsteadiness on feet: Secondary | ICD-10-CM | POA: Diagnosis not present

## 2019-12-28 DIAGNOSIS — M6281 Muscle weakness (generalized): Secondary | ICD-10-CM | POA: Diagnosis not present

## 2019-12-28 DIAGNOSIS — I613 Nontraumatic intracerebral hemorrhage in brain stem: Secondary | ICD-10-CM | POA: Diagnosis not present

## 2019-12-28 DIAGNOSIS — R278 Other lack of coordination: Secondary | ICD-10-CM | POA: Diagnosis not present

## 2019-12-28 DIAGNOSIS — R1312 Dysphagia, oropharyngeal phase: Secondary | ICD-10-CM | POA: Diagnosis not present

## 2019-12-28 DIAGNOSIS — R2689 Other abnormalities of gait and mobility: Secondary | ICD-10-CM | POA: Diagnosis not present

## 2019-12-30 DIAGNOSIS — R2689 Other abnormalities of gait and mobility: Secondary | ICD-10-CM | POA: Diagnosis not present

## 2019-12-30 DIAGNOSIS — R1312 Dysphagia, oropharyngeal phase: Secondary | ICD-10-CM | POA: Diagnosis not present

## 2019-12-30 DIAGNOSIS — R278 Other lack of coordination: Secondary | ICD-10-CM | POA: Diagnosis not present

## 2019-12-30 DIAGNOSIS — M6281 Muscle weakness (generalized): Secondary | ICD-10-CM | POA: Diagnosis not present

## 2019-12-30 DIAGNOSIS — I613 Nontraumatic intracerebral hemorrhage in brain stem: Secondary | ICD-10-CM | POA: Diagnosis not present

## 2019-12-30 DIAGNOSIS — R2681 Unsteadiness on feet: Secondary | ICD-10-CM | POA: Diagnosis not present

## 2019-12-31 DIAGNOSIS — R2689 Other abnormalities of gait and mobility: Secondary | ICD-10-CM | POA: Diagnosis not present

## 2019-12-31 DIAGNOSIS — M6281 Muscle weakness (generalized): Secondary | ICD-10-CM | POA: Diagnosis not present

## 2019-12-31 DIAGNOSIS — R2681 Unsteadiness on feet: Secondary | ICD-10-CM | POA: Diagnosis not present

## 2019-12-31 DIAGNOSIS — R278 Other lack of coordination: Secondary | ICD-10-CM | POA: Diagnosis not present

## 2019-12-31 DIAGNOSIS — R1312 Dysphagia, oropharyngeal phase: Secondary | ICD-10-CM | POA: Diagnosis not present

## 2019-12-31 DIAGNOSIS — I613 Nontraumatic intracerebral hemorrhage in brain stem: Secondary | ICD-10-CM | POA: Diagnosis not present

## 2020-01-01 DIAGNOSIS — Z03818 Encounter for observation for suspected exposure to other biological agents ruled out: Secondary | ICD-10-CM | POA: Diagnosis not present

## 2020-01-02 DIAGNOSIS — K219 Gastro-esophageal reflux disease without esophagitis: Secondary | ICD-10-CM | POA: Diagnosis not present

## 2020-01-08 DIAGNOSIS — N319 Neuromuscular dysfunction of bladder, unspecified: Secondary | ICD-10-CM | POA: Diagnosis not present

## 2020-01-08 DIAGNOSIS — Z20822 Contact with and (suspected) exposure to covid-19: Secondary | ICD-10-CM | POA: Diagnosis not present

## 2020-01-18 DIAGNOSIS — Z20822 Contact with and (suspected) exposure to covid-19: Secondary | ICD-10-CM | POA: Diagnosis not present

## 2020-01-20 DIAGNOSIS — Z20822 Contact with and (suspected) exposure to covid-19: Secondary | ICD-10-CM | POA: Diagnosis not present

## 2020-01-23 DIAGNOSIS — Z20822 Contact with and (suspected) exposure to covid-19: Secondary | ICD-10-CM | POA: Diagnosis not present

## 2020-01-30 DIAGNOSIS — H401134 Primary open-angle glaucoma, bilateral, indeterminate stage: Secondary | ICD-10-CM | POA: Diagnosis not present

## 2020-01-30 DIAGNOSIS — H353111 Nonexudative age-related macular degeneration, right eye, early dry stage: Secondary | ICD-10-CM | POA: Diagnosis not present

## 2020-01-30 DIAGNOSIS — H353122 Nonexudative age-related macular degeneration, left eye, intermediate dry stage: Secondary | ICD-10-CM | POA: Diagnosis not present

## 2020-01-30 DIAGNOSIS — H2513 Age-related nuclear cataract, bilateral: Secondary | ICD-10-CM | POA: Diagnosis not present

## 2020-02-04 DIAGNOSIS — Z20822 Contact with and (suspected) exposure to covid-19: Secondary | ICD-10-CM | POA: Diagnosis not present

## 2020-02-06 DIAGNOSIS — Z20822 Contact with and (suspected) exposure to covid-19: Secondary | ICD-10-CM | POA: Diagnosis not present

## 2020-02-07 DIAGNOSIS — N319 Neuromuscular dysfunction of bladder, unspecified: Secondary | ICD-10-CM | POA: Diagnosis not present

## 2020-02-10 DIAGNOSIS — Z20822 Contact with and (suspected) exposure to covid-19: Secondary | ICD-10-CM | POA: Diagnosis not present

## 2020-02-13 DIAGNOSIS — U071 COVID-19: Secondary | ICD-10-CM | POA: Diagnosis not present

## 2020-02-17 DIAGNOSIS — Z20822 Contact with and (suspected) exposure to covid-19: Secondary | ICD-10-CM | POA: Diagnosis not present

## 2020-02-20 DIAGNOSIS — Z20822 Contact with and (suspected) exposure to covid-19: Secondary | ICD-10-CM | POA: Diagnosis not present

## 2020-02-25 DIAGNOSIS — U071 COVID-19: Secondary | ICD-10-CM | POA: Diagnosis not present

## 2020-02-27 DIAGNOSIS — U071 COVID-19: Secondary | ICD-10-CM | POA: Diagnosis not present

## 2020-02-28 DIAGNOSIS — M2042 Other hammer toe(s) (acquired), left foot: Secondary | ICD-10-CM | POA: Diagnosis not present

## 2020-02-28 DIAGNOSIS — M2041 Other hammer toe(s) (acquired), right foot: Secondary | ICD-10-CM | POA: Diagnosis not present

## 2020-02-28 DIAGNOSIS — B351 Tinea unguium: Secondary | ICD-10-CM | POA: Diagnosis not present

## 2020-02-28 DIAGNOSIS — I739 Peripheral vascular disease, unspecified: Secondary | ICD-10-CM | POA: Diagnosis not present

## 2020-03-03 DIAGNOSIS — K219 Gastro-esophageal reflux disease without esophagitis: Secondary | ICD-10-CM | POA: Diagnosis not present

## 2020-03-03 DIAGNOSIS — Z515 Encounter for palliative care: Secondary | ICD-10-CM | POA: Diagnosis not present

## 2020-03-03 DIAGNOSIS — N319 Neuromuscular dysfunction of bladder, unspecified: Secondary | ICD-10-CM | POA: Diagnosis not present

## 2020-03-05 DIAGNOSIS — U071 COVID-19: Secondary | ICD-10-CM | POA: Diagnosis not present

## 2020-03-10 DIAGNOSIS — R69 Illness, unspecified: Secondary | ICD-10-CM | POA: Diagnosis not present

## 2020-03-10 DIAGNOSIS — N390 Urinary tract infection, site not specified: Secondary | ICD-10-CM | POA: Diagnosis not present

## 2020-03-10 DIAGNOSIS — E785 Hyperlipidemia, unspecified: Secondary | ICD-10-CM | POA: Diagnosis not present

## 2020-03-12 DIAGNOSIS — U071 COVID-19: Secondary | ICD-10-CM | POA: Diagnosis not present

## 2020-03-31 DIAGNOSIS — N319 Neuromuscular dysfunction of bladder, unspecified: Secondary | ICD-10-CM | POA: Diagnosis not present

## 2020-03-31 DIAGNOSIS — L89152 Pressure ulcer of sacral region, stage 2: Secondary | ICD-10-CM | POA: Diagnosis not present

## 2020-04-07 DIAGNOSIS — E46 Unspecified protein-calorie malnutrition: Secondary | ICD-10-CM | POA: Diagnosis not present

## 2020-04-07 DIAGNOSIS — Z0189 Encounter for other specified special examinations: Secondary | ICD-10-CM | POA: Diagnosis not present

## 2020-04-07 DIAGNOSIS — L89152 Pressure ulcer of sacral region, stage 2: Secondary | ICD-10-CM | POA: Diagnosis not present

## 2020-04-07 DIAGNOSIS — R059 Cough, unspecified: Secondary | ICD-10-CM | POA: Diagnosis not present

## 2020-04-21 DIAGNOSIS — M6281 Muscle weakness (generalized): Secondary | ICD-10-CM | POA: Diagnosis not present

## 2020-04-21 DIAGNOSIS — R2681 Unsteadiness on feet: Secondary | ICD-10-CM | POA: Diagnosis not present

## 2020-04-21 DIAGNOSIS — I613 Nontraumatic intracerebral hemorrhage in brain stem: Secondary | ICD-10-CM | POA: Diagnosis not present

## 2020-04-21 DIAGNOSIS — J9601 Acute respiratory failure with hypoxia: Secondary | ICD-10-CM | POA: Diagnosis not present

## 2020-04-21 DIAGNOSIS — R278 Other lack of coordination: Secondary | ICD-10-CM | POA: Diagnosis not present

## 2020-04-21 DIAGNOSIS — R2689 Other abnormalities of gait and mobility: Secondary | ICD-10-CM | POA: Diagnosis not present

## 2020-04-21 DIAGNOSIS — R1312 Dysphagia, oropharyngeal phase: Secondary | ICD-10-CM | POA: Diagnosis not present

## 2020-04-22 DIAGNOSIS — R2681 Unsteadiness on feet: Secondary | ICD-10-CM | POA: Diagnosis not present

## 2020-04-22 DIAGNOSIS — J9601 Acute respiratory failure with hypoxia: Secondary | ICD-10-CM | POA: Diagnosis not present

## 2020-04-22 DIAGNOSIS — M6281 Muscle weakness (generalized): Secondary | ICD-10-CM | POA: Diagnosis not present

## 2020-04-22 DIAGNOSIS — R1312 Dysphagia, oropharyngeal phase: Secondary | ICD-10-CM | POA: Diagnosis not present

## 2020-04-22 DIAGNOSIS — I613 Nontraumatic intracerebral hemorrhage in brain stem: Secondary | ICD-10-CM | POA: Diagnosis not present

## 2020-04-22 DIAGNOSIS — R278 Other lack of coordination: Secondary | ICD-10-CM | POA: Diagnosis not present

## 2020-04-22 DIAGNOSIS — R2689 Other abnormalities of gait and mobility: Secondary | ICD-10-CM | POA: Diagnosis not present

## 2020-04-23 DIAGNOSIS — R2689 Other abnormalities of gait and mobility: Secondary | ICD-10-CM | POA: Diagnosis not present

## 2020-04-23 DIAGNOSIS — R278 Other lack of coordination: Secondary | ICD-10-CM | POA: Diagnosis not present

## 2020-04-23 DIAGNOSIS — I613 Nontraumatic intracerebral hemorrhage in brain stem: Secondary | ICD-10-CM | POA: Diagnosis not present

## 2020-04-23 DIAGNOSIS — J9601 Acute respiratory failure with hypoxia: Secondary | ICD-10-CM | POA: Diagnosis not present

## 2020-04-23 DIAGNOSIS — R1312 Dysphagia, oropharyngeal phase: Secondary | ICD-10-CM | POA: Diagnosis not present

## 2020-04-23 DIAGNOSIS — R2681 Unsteadiness on feet: Secondary | ICD-10-CM | POA: Diagnosis not present

## 2020-04-23 DIAGNOSIS — E46 Unspecified protein-calorie malnutrition: Secondary | ICD-10-CM | POA: Diagnosis not present

## 2020-04-23 DIAGNOSIS — R339 Retention of urine, unspecified: Secondary | ICD-10-CM | POA: Diagnosis not present

## 2020-04-23 DIAGNOSIS — F482 Pseudobulbar affect: Secondary | ICD-10-CM | POA: Diagnosis not present

## 2020-04-23 DIAGNOSIS — M6281 Muscle weakness (generalized): Secondary | ICD-10-CM | POA: Diagnosis not present

## 2020-04-26 DIAGNOSIS — R278 Other lack of coordination: Secondary | ICD-10-CM | POA: Diagnosis not present

## 2020-04-26 DIAGNOSIS — R1312 Dysphagia, oropharyngeal phase: Secondary | ICD-10-CM | POA: Diagnosis not present

## 2020-04-26 DIAGNOSIS — M6281 Muscle weakness (generalized): Secondary | ICD-10-CM | POA: Diagnosis not present

## 2020-04-26 DIAGNOSIS — R2681 Unsteadiness on feet: Secondary | ICD-10-CM | POA: Diagnosis not present

## 2020-04-26 DIAGNOSIS — I613 Nontraumatic intracerebral hemorrhage in brain stem: Secondary | ICD-10-CM | POA: Diagnosis not present

## 2020-04-26 DIAGNOSIS — J9601 Acute respiratory failure with hypoxia: Secondary | ICD-10-CM | POA: Diagnosis not present

## 2020-04-26 DIAGNOSIS — R2689 Other abnormalities of gait and mobility: Secondary | ICD-10-CM | POA: Diagnosis not present

## 2020-04-27 DIAGNOSIS — R1312 Dysphagia, oropharyngeal phase: Secondary | ICD-10-CM | POA: Diagnosis not present

## 2020-04-27 DIAGNOSIS — R278 Other lack of coordination: Secondary | ICD-10-CM | POA: Diagnosis not present

## 2020-04-27 DIAGNOSIS — I613 Nontraumatic intracerebral hemorrhage in brain stem: Secondary | ICD-10-CM | POA: Diagnosis not present

## 2020-04-27 DIAGNOSIS — M6281 Muscle weakness (generalized): Secondary | ICD-10-CM | POA: Diagnosis not present

## 2020-04-27 DIAGNOSIS — R2681 Unsteadiness on feet: Secondary | ICD-10-CM | POA: Diagnosis not present

## 2020-04-27 DIAGNOSIS — J9601 Acute respiratory failure with hypoxia: Secondary | ICD-10-CM | POA: Diagnosis not present

## 2020-04-27 DIAGNOSIS — R2689 Other abnormalities of gait and mobility: Secondary | ICD-10-CM | POA: Diagnosis not present

## 2020-04-28 DIAGNOSIS — M6281 Muscle weakness (generalized): Secondary | ICD-10-CM | POA: Diagnosis not present

## 2020-04-28 DIAGNOSIS — J9601 Acute respiratory failure with hypoxia: Secondary | ICD-10-CM | POA: Diagnosis not present

## 2020-04-28 DIAGNOSIS — R2689 Other abnormalities of gait and mobility: Secondary | ICD-10-CM | POA: Diagnosis not present

## 2020-04-28 DIAGNOSIS — R278 Other lack of coordination: Secondary | ICD-10-CM | POA: Diagnosis not present

## 2020-04-28 DIAGNOSIS — R1312 Dysphagia, oropharyngeal phase: Secondary | ICD-10-CM | POA: Diagnosis not present

## 2020-04-28 DIAGNOSIS — I613 Nontraumatic intracerebral hemorrhage in brain stem: Secondary | ICD-10-CM | POA: Diagnosis not present

## 2020-04-28 DIAGNOSIS — R2681 Unsteadiness on feet: Secondary | ICD-10-CM | POA: Diagnosis not present

## 2020-04-29 DIAGNOSIS — R1312 Dysphagia, oropharyngeal phase: Secondary | ICD-10-CM | POA: Diagnosis not present

## 2020-04-29 DIAGNOSIS — I613 Nontraumatic intracerebral hemorrhage in brain stem: Secondary | ICD-10-CM | POA: Diagnosis not present

## 2020-04-29 DIAGNOSIS — R2689 Other abnormalities of gait and mobility: Secondary | ICD-10-CM | POA: Diagnosis not present

## 2020-04-29 DIAGNOSIS — M6281 Muscle weakness (generalized): Secondary | ICD-10-CM | POA: Diagnosis not present

## 2020-04-29 DIAGNOSIS — R278 Other lack of coordination: Secondary | ICD-10-CM | POA: Diagnosis not present

## 2020-04-29 DIAGNOSIS — J9601 Acute respiratory failure with hypoxia: Secondary | ICD-10-CM | POA: Diagnosis not present

## 2020-04-29 DIAGNOSIS — R2681 Unsteadiness on feet: Secondary | ICD-10-CM | POA: Diagnosis not present

## 2020-04-30 DIAGNOSIS — J9601 Acute respiratory failure with hypoxia: Secondary | ICD-10-CM | POA: Diagnosis not present

## 2020-04-30 DIAGNOSIS — R2681 Unsteadiness on feet: Secondary | ICD-10-CM | POA: Diagnosis not present

## 2020-04-30 DIAGNOSIS — L89152 Pressure ulcer of sacral region, stage 2: Secondary | ICD-10-CM | POA: Diagnosis not present

## 2020-04-30 DIAGNOSIS — R338 Other retention of urine: Secondary | ICD-10-CM | POA: Diagnosis not present

## 2020-04-30 DIAGNOSIS — N319 Neuromuscular dysfunction of bladder, unspecified: Secondary | ICD-10-CM | POA: Diagnosis not present

## 2020-04-30 DIAGNOSIS — R310 Gross hematuria: Secondary | ICD-10-CM | POA: Diagnosis not present

## 2020-04-30 DIAGNOSIS — R278 Other lack of coordination: Secondary | ICD-10-CM | POA: Diagnosis not present

## 2020-04-30 DIAGNOSIS — M6281 Muscle weakness (generalized): Secondary | ICD-10-CM | POA: Diagnosis not present

## 2020-04-30 DIAGNOSIS — R2689 Other abnormalities of gait and mobility: Secondary | ICD-10-CM | POA: Diagnosis not present

## 2020-04-30 DIAGNOSIS — I613 Nontraumatic intracerebral hemorrhage in brain stem: Secondary | ICD-10-CM | POA: Diagnosis not present

## 2020-04-30 DIAGNOSIS — R1312 Dysphagia, oropharyngeal phase: Secondary | ICD-10-CM | POA: Diagnosis not present

## 2020-05-01 DIAGNOSIS — J9610 Chronic respiratory failure, unspecified whether with hypoxia or hypercapnia: Secondary | ICD-10-CM | POA: Diagnosis not present

## 2020-05-01 DIAGNOSIS — M6281 Muscle weakness (generalized): Secondary | ICD-10-CM | POA: Diagnosis not present

## 2020-05-01 DIAGNOSIS — N390 Urinary tract infection, site not specified: Secondary | ICD-10-CM | POA: Diagnosis not present

## 2020-05-01 DIAGNOSIS — R2681 Unsteadiness on feet: Secondary | ICD-10-CM | POA: Diagnosis not present

## 2020-05-01 DIAGNOSIS — R1312 Dysphagia, oropharyngeal phase: Secondary | ICD-10-CM | POA: Diagnosis not present

## 2020-05-01 DIAGNOSIS — N319 Neuromuscular dysfunction of bladder, unspecified: Secondary | ICD-10-CM | POA: Diagnosis not present

## 2020-05-01 DIAGNOSIS — I613 Nontraumatic intracerebral hemorrhage in brain stem: Secondary | ICD-10-CM | POA: Diagnosis not present

## 2020-05-01 DIAGNOSIS — R310 Gross hematuria: Secondary | ICD-10-CM | POA: Diagnosis not present

## 2020-05-01 DIAGNOSIS — E785 Hyperlipidemia, unspecified: Secondary | ICD-10-CM | POA: Diagnosis not present

## 2020-05-01 DIAGNOSIS — R2689 Other abnormalities of gait and mobility: Secondary | ICD-10-CM | POA: Diagnosis not present

## 2020-05-01 DIAGNOSIS — J9601 Acute respiratory failure with hypoxia: Secondary | ICD-10-CM | POA: Diagnosis not present

## 2020-05-01 DIAGNOSIS — R278 Other lack of coordination: Secondary | ICD-10-CM | POA: Diagnosis not present

## 2020-05-01 DIAGNOSIS — K219 Gastro-esophageal reflux disease without esophagitis: Secondary | ICD-10-CM | POA: Diagnosis not present

## 2020-05-01 DIAGNOSIS — I10 Essential (primary) hypertension: Secondary | ICD-10-CM | POA: Diagnosis not present

## 2020-05-04 DIAGNOSIS — I613 Nontraumatic intracerebral hemorrhage in brain stem: Secondary | ICD-10-CM | POA: Diagnosis not present

## 2020-05-04 DIAGNOSIS — J9601 Acute respiratory failure with hypoxia: Secondary | ICD-10-CM | POA: Diagnosis not present

## 2020-05-04 DIAGNOSIS — M6281 Muscle weakness (generalized): Secondary | ICD-10-CM | POA: Diagnosis not present

## 2020-05-04 DIAGNOSIS — R2681 Unsteadiness on feet: Secondary | ICD-10-CM | POA: Diagnosis not present

## 2020-05-04 DIAGNOSIS — R2689 Other abnormalities of gait and mobility: Secondary | ICD-10-CM | POA: Diagnosis not present

## 2020-05-04 DIAGNOSIS — R278 Other lack of coordination: Secondary | ICD-10-CM | POA: Diagnosis not present

## 2020-05-04 DIAGNOSIS — R1312 Dysphagia, oropharyngeal phase: Secondary | ICD-10-CM | POA: Diagnosis not present

## 2020-05-05 DIAGNOSIS — J9601 Acute respiratory failure with hypoxia: Secondary | ICD-10-CM | POA: Diagnosis not present

## 2020-05-05 DIAGNOSIS — R278 Other lack of coordination: Secondary | ICD-10-CM | POA: Diagnosis not present

## 2020-05-05 DIAGNOSIS — R310 Gross hematuria: Secondary | ICD-10-CM | POA: Diagnosis not present

## 2020-05-05 DIAGNOSIS — R1312 Dysphagia, oropharyngeal phase: Secondary | ICD-10-CM | POA: Diagnosis not present

## 2020-05-05 DIAGNOSIS — I6789 Other cerebrovascular disease: Secondary | ICD-10-CM | POA: Diagnosis not present

## 2020-05-05 DIAGNOSIS — R2681 Unsteadiness on feet: Secondary | ICD-10-CM | POA: Diagnosis not present

## 2020-05-05 DIAGNOSIS — I613 Nontraumatic intracerebral hemorrhage in brain stem: Secondary | ICD-10-CM | POA: Diagnosis not present

## 2020-05-05 DIAGNOSIS — M6281 Muscle weakness (generalized): Secondary | ICD-10-CM | POA: Diagnosis not present

## 2020-05-05 DIAGNOSIS — R2689 Other abnormalities of gait and mobility: Secondary | ICD-10-CM | POA: Diagnosis not present

## 2020-05-06 DIAGNOSIS — I613 Nontraumatic intracerebral hemorrhage in brain stem: Secondary | ICD-10-CM | POA: Diagnosis not present

## 2020-05-06 DIAGNOSIS — R278 Other lack of coordination: Secondary | ICD-10-CM | POA: Diagnosis not present

## 2020-05-06 DIAGNOSIS — M6281 Muscle weakness (generalized): Secondary | ICD-10-CM | POA: Diagnosis not present

## 2020-05-06 DIAGNOSIS — J9601 Acute respiratory failure with hypoxia: Secondary | ICD-10-CM | POA: Diagnosis not present

## 2020-05-06 DIAGNOSIS — R1312 Dysphagia, oropharyngeal phase: Secondary | ICD-10-CM | POA: Diagnosis not present

## 2020-05-06 DIAGNOSIS — R2681 Unsteadiness on feet: Secondary | ICD-10-CM | POA: Diagnosis not present

## 2020-05-06 DIAGNOSIS — R2689 Other abnormalities of gait and mobility: Secondary | ICD-10-CM | POA: Diagnosis not present

## 2020-05-07 DIAGNOSIS — M6281 Muscle weakness (generalized): Secondary | ICD-10-CM | POA: Diagnosis not present

## 2020-05-07 DIAGNOSIS — J9601 Acute respiratory failure with hypoxia: Secondary | ICD-10-CM | POA: Diagnosis not present

## 2020-05-07 DIAGNOSIS — I613 Nontraumatic intracerebral hemorrhage in brain stem: Secondary | ICD-10-CM | POA: Diagnosis not present

## 2020-05-07 DIAGNOSIS — R2681 Unsteadiness on feet: Secondary | ICD-10-CM | POA: Diagnosis not present

## 2020-05-07 DIAGNOSIS — R2689 Other abnormalities of gait and mobility: Secondary | ICD-10-CM | POA: Diagnosis not present

## 2020-05-07 DIAGNOSIS — R278 Other lack of coordination: Secondary | ICD-10-CM | POA: Diagnosis not present

## 2020-05-07 DIAGNOSIS — R1312 Dysphagia, oropharyngeal phase: Secondary | ICD-10-CM | POA: Diagnosis not present

## 2020-05-08 DIAGNOSIS — I613 Nontraumatic intracerebral hemorrhage in brain stem: Secondary | ICD-10-CM | POA: Diagnosis not present

## 2020-05-08 DIAGNOSIS — R2689 Other abnormalities of gait and mobility: Secondary | ICD-10-CM | POA: Diagnosis not present

## 2020-05-08 DIAGNOSIS — R2681 Unsteadiness on feet: Secondary | ICD-10-CM | POA: Diagnosis not present

## 2020-05-08 DIAGNOSIS — R278 Other lack of coordination: Secondary | ICD-10-CM | POA: Diagnosis not present

## 2020-05-08 DIAGNOSIS — J9601 Acute respiratory failure with hypoxia: Secondary | ICD-10-CM | POA: Diagnosis not present

## 2020-05-08 DIAGNOSIS — M6281 Muscle weakness (generalized): Secondary | ICD-10-CM | POA: Diagnosis not present

## 2020-05-08 DIAGNOSIS — R1312 Dysphagia, oropharyngeal phase: Secondary | ICD-10-CM | POA: Diagnosis not present

## 2020-05-11 DIAGNOSIS — R2689 Other abnormalities of gait and mobility: Secondary | ICD-10-CM | POA: Diagnosis not present

## 2020-05-11 DIAGNOSIS — R1312 Dysphagia, oropharyngeal phase: Secondary | ICD-10-CM | POA: Diagnosis not present

## 2020-05-11 DIAGNOSIS — R2681 Unsteadiness on feet: Secondary | ICD-10-CM | POA: Diagnosis not present

## 2020-05-11 DIAGNOSIS — R278 Other lack of coordination: Secondary | ICD-10-CM | POA: Diagnosis not present

## 2020-05-11 DIAGNOSIS — M6281 Muscle weakness (generalized): Secondary | ICD-10-CM | POA: Diagnosis not present

## 2020-05-11 DIAGNOSIS — I613 Nontraumatic intracerebral hemorrhage in brain stem: Secondary | ICD-10-CM | POA: Diagnosis not present

## 2020-05-11 DIAGNOSIS — J9601 Acute respiratory failure with hypoxia: Secondary | ICD-10-CM | POA: Diagnosis not present

## 2020-05-12 DIAGNOSIS — J9601 Acute respiratory failure with hypoxia: Secondary | ICD-10-CM | POA: Diagnosis not present

## 2020-05-12 DIAGNOSIS — M6281 Muscle weakness (generalized): Secondary | ICD-10-CM | POA: Diagnosis not present

## 2020-05-12 DIAGNOSIS — R2681 Unsteadiness on feet: Secondary | ICD-10-CM | POA: Diagnosis not present

## 2020-05-12 DIAGNOSIS — R1312 Dysphagia, oropharyngeal phase: Secondary | ICD-10-CM | POA: Diagnosis not present

## 2020-05-12 DIAGNOSIS — R278 Other lack of coordination: Secondary | ICD-10-CM | POA: Diagnosis not present

## 2020-05-12 DIAGNOSIS — I613 Nontraumatic intracerebral hemorrhage in brain stem: Secondary | ICD-10-CM | POA: Diagnosis not present

## 2020-05-12 DIAGNOSIS — R2689 Other abnormalities of gait and mobility: Secondary | ICD-10-CM | POA: Diagnosis not present

## 2020-05-13 DIAGNOSIS — R1312 Dysphagia, oropharyngeal phase: Secondary | ICD-10-CM | POA: Diagnosis not present

## 2020-05-13 DIAGNOSIS — I613 Nontraumatic intracerebral hemorrhage in brain stem: Secondary | ICD-10-CM | POA: Diagnosis not present

## 2020-05-13 DIAGNOSIS — R2689 Other abnormalities of gait and mobility: Secondary | ICD-10-CM | POA: Diagnosis not present

## 2020-05-13 DIAGNOSIS — R278 Other lack of coordination: Secondary | ICD-10-CM | POA: Diagnosis not present

## 2020-05-13 DIAGNOSIS — M6281 Muscle weakness (generalized): Secondary | ICD-10-CM | POA: Diagnosis not present

## 2020-05-13 DIAGNOSIS — R2681 Unsteadiness on feet: Secondary | ICD-10-CM | POA: Diagnosis not present

## 2020-05-13 DIAGNOSIS — J9601 Acute respiratory failure with hypoxia: Secondary | ICD-10-CM | POA: Diagnosis not present

## 2020-05-25 DIAGNOSIS — R1312 Dysphagia, oropharyngeal phase: Secondary | ICD-10-CM | POA: Diagnosis not present

## 2020-05-25 DIAGNOSIS — I613 Nontraumatic intracerebral hemorrhage in brain stem: Secondary | ICD-10-CM | POA: Diagnosis not present

## 2020-05-25 DIAGNOSIS — J9601 Acute respiratory failure with hypoxia: Secondary | ICD-10-CM | POA: Diagnosis not present

## 2020-05-25 DIAGNOSIS — R278 Other lack of coordination: Secondary | ICD-10-CM | POA: Diagnosis not present

## 2020-05-25 DIAGNOSIS — M6281 Muscle weakness (generalized): Secondary | ICD-10-CM | POA: Diagnosis not present

## 2020-05-25 DIAGNOSIS — R2689 Other abnormalities of gait and mobility: Secondary | ICD-10-CM | POA: Diagnosis not present

## 2020-05-25 DIAGNOSIS — R2681 Unsteadiness on feet: Secondary | ICD-10-CM | POA: Diagnosis not present

## 2020-05-26 DIAGNOSIS — B370 Candidal stomatitis: Secondary | ICD-10-CM | POA: Diagnosis not present

## 2020-05-26 DIAGNOSIS — I613 Nontraumatic intracerebral hemorrhage in brain stem: Secondary | ICD-10-CM | POA: Diagnosis not present

## 2020-05-26 DIAGNOSIS — R2681 Unsteadiness on feet: Secondary | ICD-10-CM | POA: Diagnosis not present

## 2020-05-26 DIAGNOSIS — R278 Other lack of coordination: Secondary | ICD-10-CM | POA: Diagnosis not present

## 2020-05-26 DIAGNOSIS — R1312 Dysphagia, oropharyngeal phase: Secondary | ICD-10-CM | POA: Diagnosis not present

## 2020-05-26 DIAGNOSIS — M6281 Muscle weakness (generalized): Secondary | ICD-10-CM | POA: Diagnosis not present

## 2020-05-26 DIAGNOSIS — R2689 Other abnormalities of gait and mobility: Secondary | ICD-10-CM | POA: Diagnosis not present

## 2020-05-26 DIAGNOSIS — J9601 Acute respiratory failure with hypoxia: Secondary | ICD-10-CM | POA: Diagnosis not present

## 2020-05-26 DIAGNOSIS — E46 Unspecified protein-calorie malnutrition: Secondary | ICD-10-CM | POA: Diagnosis not present

## 2020-05-27 DIAGNOSIS — L89152 Pressure ulcer of sacral region, stage 2: Secondary | ICD-10-CM | POA: Diagnosis not present

## 2020-05-27 DIAGNOSIS — R278 Other lack of coordination: Secondary | ICD-10-CM | POA: Diagnosis not present

## 2020-05-27 DIAGNOSIS — J9601 Acute respiratory failure with hypoxia: Secondary | ICD-10-CM | POA: Diagnosis not present

## 2020-05-27 DIAGNOSIS — R2681 Unsteadiness on feet: Secondary | ICD-10-CM | POA: Diagnosis not present

## 2020-05-27 DIAGNOSIS — M6281 Muscle weakness (generalized): Secondary | ICD-10-CM | POA: Diagnosis not present

## 2020-05-27 DIAGNOSIS — R1312 Dysphagia, oropharyngeal phase: Secondary | ICD-10-CM | POA: Diagnosis not present

## 2020-05-27 DIAGNOSIS — I613 Nontraumatic intracerebral hemorrhage in brain stem: Secondary | ICD-10-CM | POA: Diagnosis not present

## 2020-05-27 DIAGNOSIS — R2689 Other abnormalities of gait and mobility: Secondary | ICD-10-CM | POA: Diagnosis not present

## 2020-05-28 DIAGNOSIS — R1312 Dysphagia, oropharyngeal phase: Secondary | ICD-10-CM | POA: Diagnosis not present

## 2020-05-28 DIAGNOSIS — R2689 Other abnormalities of gait and mobility: Secondary | ICD-10-CM | POA: Diagnosis not present

## 2020-05-28 DIAGNOSIS — I613 Nontraumatic intracerebral hemorrhage in brain stem: Secondary | ICD-10-CM | POA: Diagnosis not present

## 2020-05-28 DIAGNOSIS — M6281 Muscle weakness (generalized): Secondary | ICD-10-CM | POA: Diagnosis not present

## 2020-05-28 DIAGNOSIS — J9601 Acute respiratory failure with hypoxia: Secondary | ICD-10-CM | POA: Diagnosis not present

## 2020-05-28 DIAGNOSIS — R278 Other lack of coordination: Secondary | ICD-10-CM | POA: Diagnosis not present

## 2020-05-28 DIAGNOSIS — R2681 Unsteadiness on feet: Secondary | ICD-10-CM | POA: Diagnosis not present

## 2020-05-29 DIAGNOSIS — R278 Other lack of coordination: Secondary | ICD-10-CM | POA: Diagnosis not present

## 2020-05-29 DIAGNOSIS — I10 Essential (primary) hypertension: Secondary | ICD-10-CM | POA: Diagnosis not present

## 2020-05-29 DIAGNOSIS — R2689 Other abnormalities of gait and mobility: Secondary | ICD-10-CM | POA: Diagnosis not present

## 2020-05-29 DIAGNOSIS — K219 Gastro-esophageal reflux disease without esophagitis: Secondary | ICD-10-CM | POA: Diagnosis not present

## 2020-05-29 DIAGNOSIS — R2681 Unsteadiness on feet: Secondary | ICD-10-CM | POA: Diagnosis not present

## 2020-05-29 DIAGNOSIS — R1310 Dysphagia, unspecified: Secondary | ICD-10-CM | POA: Diagnosis not present

## 2020-05-29 DIAGNOSIS — N319 Neuromuscular dysfunction of bladder, unspecified: Secondary | ICD-10-CM | POA: Diagnosis not present

## 2020-05-29 DIAGNOSIS — J9601 Acute respiratory failure with hypoxia: Secondary | ICD-10-CM | POA: Diagnosis not present

## 2020-05-29 DIAGNOSIS — M6281 Muscle weakness (generalized): Secondary | ICD-10-CM | POA: Diagnosis not present

## 2020-05-29 DIAGNOSIS — E782 Mixed hyperlipidemia: Secondary | ICD-10-CM | POA: Diagnosis not present

## 2020-05-29 DIAGNOSIS — R1312 Dysphagia, oropharyngeal phase: Secondary | ICD-10-CM | POA: Diagnosis not present

## 2020-05-29 DIAGNOSIS — I613 Nontraumatic intracerebral hemorrhage in brain stem: Secondary | ICD-10-CM | POA: Diagnosis not present

## 2020-05-29 DIAGNOSIS — J9610 Chronic respiratory failure, unspecified whether with hypoxia or hypercapnia: Secondary | ICD-10-CM | POA: Diagnosis not present

## 2020-06-01 DIAGNOSIS — R1312 Dysphagia, oropharyngeal phase: Secondary | ICD-10-CM | POA: Diagnosis not present

## 2020-06-01 DIAGNOSIS — I613 Nontraumatic intracerebral hemorrhage in brain stem: Secondary | ICD-10-CM | POA: Diagnosis not present

## 2020-06-01 DIAGNOSIS — R278 Other lack of coordination: Secondary | ICD-10-CM | POA: Diagnosis not present

## 2020-06-01 DIAGNOSIS — R2681 Unsteadiness on feet: Secondary | ICD-10-CM | POA: Diagnosis not present

## 2020-06-01 DIAGNOSIS — M6281 Muscle weakness (generalized): Secondary | ICD-10-CM | POA: Diagnosis not present

## 2020-06-01 DIAGNOSIS — R2689 Other abnormalities of gait and mobility: Secondary | ICD-10-CM | POA: Diagnosis not present

## 2020-06-01 DIAGNOSIS — J9601 Acute respiratory failure with hypoxia: Secondary | ICD-10-CM | POA: Diagnosis not present

## 2020-06-04 DIAGNOSIS — E782 Mixed hyperlipidemia: Secondary | ICD-10-CM | POA: Diagnosis not present

## 2020-06-04 DIAGNOSIS — I10 Essential (primary) hypertension: Secondary | ICD-10-CM | POA: Diagnosis not present

## 2020-06-04 DIAGNOSIS — M6281 Muscle weakness (generalized): Secondary | ICD-10-CM | POA: Diagnosis not present

## 2020-06-04 DIAGNOSIS — N319 Neuromuscular dysfunction of bladder, unspecified: Secondary | ICD-10-CM | POA: Diagnosis not present

## 2020-06-04 DIAGNOSIS — R1310 Dysphagia, unspecified: Secondary | ICD-10-CM | POA: Diagnosis not present

## 2020-06-04 DIAGNOSIS — J9610 Chronic respiratory failure, unspecified whether with hypoxia or hypercapnia: Secondary | ICD-10-CM | POA: Diagnosis not present

## 2020-06-04 DIAGNOSIS — K219 Gastro-esophageal reflux disease without esophagitis: Secondary | ICD-10-CM | POA: Diagnosis not present

## 2020-06-05 DIAGNOSIS — R262 Difficulty in walking, not elsewhere classified: Secondary | ICD-10-CM | POA: Diagnosis not present

## 2020-06-05 DIAGNOSIS — N319 Neuromuscular dysfunction of bladder, unspecified: Secondary | ICD-10-CM | POA: Diagnosis not present

## 2020-06-05 DIAGNOSIS — M79661 Pain in right lower leg: Secondary | ICD-10-CM | POA: Diagnosis not present

## 2020-06-05 DIAGNOSIS — M25551 Pain in right hip: Secondary | ICD-10-CM | POA: Diagnosis not present

## 2020-06-05 DIAGNOSIS — M79651 Pain in right thigh: Secondary | ICD-10-CM | POA: Diagnosis not present

## 2020-06-05 DIAGNOSIS — R2689 Other abnormalities of gait and mobility: Secondary | ICD-10-CM | POA: Diagnosis not present

## 2020-06-05 DIAGNOSIS — M79604 Pain in right leg: Secondary | ICD-10-CM | POA: Diagnosis not present

## 2020-06-05 DIAGNOSIS — R339 Retention of urine, unspecified: Secondary | ICD-10-CM | POA: Diagnosis not present

## 2020-06-05 DIAGNOSIS — Z960 Presence of urogenital implants: Secondary | ICD-10-CM | POA: Diagnosis not present

## 2020-06-17 DIAGNOSIS — K068 Other specified disorders of gingiva and edentulous alveolar ridge: Secondary | ICD-10-CM | POA: Diagnosis not present

## 2020-06-17 DIAGNOSIS — R2681 Unsteadiness on feet: Secondary | ICD-10-CM | POA: Diagnosis not present

## 2020-06-17 DIAGNOSIS — N390 Urinary tract infection, site not specified: Secondary | ICD-10-CM | POA: Diagnosis not present

## 2020-06-17 DIAGNOSIS — B370 Candidal stomatitis: Secondary | ICD-10-CM | POA: Diagnosis not present

## 2020-06-18 DIAGNOSIS — I739 Peripheral vascular disease, unspecified: Secondary | ICD-10-CM | POA: Diagnosis not present

## 2020-06-18 DIAGNOSIS — B351 Tinea unguium: Secondary | ICD-10-CM | POA: Diagnosis not present

## 2020-06-19 DIAGNOSIS — A499 Bacterial infection, unspecified: Secondary | ICD-10-CM | POA: Diagnosis not present

## 2020-06-19 DIAGNOSIS — Z1612 Extended spectrum beta lactamase (ESBL) resistance: Secondary | ICD-10-CM | POA: Diagnosis not present

## 2020-06-19 DIAGNOSIS — N390 Urinary tract infection, site not specified: Secondary | ICD-10-CM | POA: Diagnosis not present

## 2020-06-19 DIAGNOSIS — N369 Urethral disorder, unspecified: Secondary | ICD-10-CM | POA: Diagnosis not present

## 2020-06-23 DIAGNOSIS — N319 Neuromuscular dysfunction of bladder, unspecified: Secondary | ICD-10-CM | POA: Diagnosis not present

## 2020-06-23 DIAGNOSIS — L89152 Pressure ulcer of sacral region, stage 2: Secondary | ICD-10-CM | POA: Diagnosis not present

## 2020-06-26 DIAGNOSIS — Z8619 Personal history of other infectious and parasitic diseases: Secondary | ICD-10-CM | POA: Diagnosis not present

## 2020-06-26 DIAGNOSIS — E440 Moderate protein-calorie malnutrition: Secondary | ICD-10-CM | POA: Diagnosis not present

## 2020-06-26 DIAGNOSIS — N390 Urinary tract infection, site not specified: Secondary | ICD-10-CM | POA: Diagnosis not present

## 2020-06-26 DIAGNOSIS — B370 Candidal stomatitis: Secondary | ICD-10-CM | POA: Diagnosis not present

## 2020-06-26 DIAGNOSIS — I10 Essential (primary) hypertension: Secondary | ICD-10-CM | POA: Diagnosis not present

## 2020-06-29 DIAGNOSIS — R2681 Unsteadiness on feet: Secondary | ICD-10-CM | POA: Diagnosis not present

## 2020-06-29 DIAGNOSIS — I613 Nontraumatic intracerebral hemorrhage in brain stem: Secondary | ICD-10-CM | POA: Diagnosis not present

## 2020-06-29 DIAGNOSIS — R2689 Other abnormalities of gait and mobility: Secondary | ICD-10-CM | POA: Diagnosis not present

## 2020-06-29 DIAGNOSIS — R278 Other lack of coordination: Secondary | ICD-10-CM | POA: Diagnosis not present

## 2020-06-29 DIAGNOSIS — M6281 Muscle weakness (generalized): Secondary | ICD-10-CM | POA: Diagnosis not present

## 2020-06-29 DIAGNOSIS — J9601 Acute respiratory failure with hypoxia: Secondary | ICD-10-CM | POA: Diagnosis not present

## 2020-06-29 DIAGNOSIS — R1312 Dysphagia, oropharyngeal phase: Secondary | ICD-10-CM | POA: Diagnosis not present

## 2020-06-30 DIAGNOSIS — M6281 Muscle weakness (generalized): Secondary | ICD-10-CM | POA: Diagnosis not present

## 2020-06-30 DIAGNOSIS — R634 Abnormal weight loss: Secondary | ICD-10-CM | POA: Diagnosis not present

## 2020-06-30 DIAGNOSIS — R278 Other lack of coordination: Secondary | ICD-10-CM | POA: Diagnosis not present

## 2020-06-30 DIAGNOSIS — J9601 Acute respiratory failure with hypoxia: Secondary | ICD-10-CM | POA: Diagnosis not present

## 2020-06-30 DIAGNOSIS — L89152 Pressure ulcer of sacral region, stage 2: Secondary | ICD-10-CM | POA: Diagnosis not present

## 2020-06-30 DIAGNOSIS — R1312 Dysphagia, oropharyngeal phase: Secondary | ICD-10-CM | POA: Diagnosis not present

## 2020-06-30 DIAGNOSIS — E440 Moderate protein-calorie malnutrition: Secondary | ICD-10-CM | POA: Diagnosis not present

## 2020-06-30 DIAGNOSIS — I613 Nontraumatic intracerebral hemorrhage in brain stem: Secondary | ICD-10-CM | POA: Diagnosis not present

## 2020-06-30 DIAGNOSIS — R2681 Unsteadiness on feet: Secondary | ICD-10-CM | POA: Diagnosis not present

## 2020-06-30 DIAGNOSIS — R2689 Other abnormalities of gait and mobility: Secondary | ICD-10-CM | POA: Diagnosis not present

## 2020-07-02 DIAGNOSIS — R2689 Other abnormalities of gait and mobility: Secondary | ICD-10-CM | POA: Diagnosis not present

## 2020-07-02 DIAGNOSIS — M6281 Muscle weakness (generalized): Secondary | ICD-10-CM | POA: Diagnosis not present

## 2020-07-02 DIAGNOSIS — R1312 Dysphagia, oropharyngeal phase: Secondary | ICD-10-CM | POA: Diagnosis not present

## 2020-07-02 DIAGNOSIS — J9601 Acute respiratory failure with hypoxia: Secondary | ICD-10-CM | POA: Diagnosis not present

## 2020-07-02 DIAGNOSIS — R2681 Unsteadiness on feet: Secondary | ICD-10-CM | POA: Diagnosis not present

## 2020-07-02 DIAGNOSIS — R278 Other lack of coordination: Secondary | ICD-10-CM | POA: Diagnosis not present

## 2020-07-02 DIAGNOSIS — I613 Nontraumatic intracerebral hemorrhage in brain stem: Secondary | ICD-10-CM | POA: Diagnosis not present

## 2020-07-04 DIAGNOSIS — R1312 Dysphagia, oropharyngeal phase: Secondary | ICD-10-CM | POA: Diagnosis not present

## 2020-07-04 DIAGNOSIS — R2681 Unsteadiness on feet: Secondary | ICD-10-CM | POA: Diagnosis not present

## 2020-07-04 DIAGNOSIS — R2689 Other abnormalities of gait and mobility: Secondary | ICD-10-CM | POA: Diagnosis not present

## 2020-07-04 DIAGNOSIS — J9601 Acute respiratory failure with hypoxia: Secondary | ICD-10-CM | POA: Diagnosis not present

## 2020-07-04 DIAGNOSIS — R278 Other lack of coordination: Secondary | ICD-10-CM | POA: Diagnosis not present

## 2020-07-04 DIAGNOSIS — M6281 Muscle weakness (generalized): Secondary | ICD-10-CM | POA: Diagnosis not present

## 2020-07-04 DIAGNOSIS — I613 Nontraumatic intracerebral hemorrhage in brain stem: Secondary | ICD-10-CM | POA: Diagnosis not present

## 2020-07-06 DIAGNOSIS — R278 Other lack of coordination: Secondary | ICD-10-CM | POA: Diagnosis not present

## 2020-07-06 DIAGNOSIS — R2681 Unsteadiness on feet: Secondary | ICD-10-CM | POA: Diagnosis not present

## 2020-07-06 DIAGNOSIS — R2689 Other abnormalities of gait and mobility: Secondary | ICD-10-CM | POA: Diagnosis not present

## 2020-07-06 DIAGNOSIS — R1312 Dysphagia, oropharyngeal phase: Secondary | ICD-10-CM | POA: Diagnosis not present

## 2020-07-06 DIAGNOSIS — M6281 Muscle weakness (generalized): Secondary | ICD-10-CM | POA: Diagnosis not present

## 2020-07-06 DIAGNOSIS — J9601 Acute respiratory failure with hypoxia: Secondary | ICD-10-CM | POA: Diagnosis not present

## 2020-07-06 DIAGNOSIS — I613 Nontraumatic intracerebral hemorrhage in brain stem: Secondary | ICD-10-CM | POA: Diagnosis not present

## 2020-07-07 DIAGNOSIS — R1312 Dysphagia, oropharyngeal phase: Secondary | ICD-10-CM | POA: Diagnosis not present

## 2020-07-07 DIAGNOSIS — I613 Nontraumatic intracerebral hemorrhage in brain stem: Secondary | ICD-10-CM | POA: Diagnosis not present

## 2020-07-07 DIAGNOSIS — J9601 Acute respiratory failure with hypoxia: Secondary | ICD-10-CM | POA: Diagnosis not present

## 2020-07-07 DIAGNOSIS — R2689 Other abnormalities of gait and mobility: Secondary | ICD-10-CM | POA: Diagnosis not present

## 2020-07-07 DIAGNOSIS — R2681 Unsteadiness on feet: Secondary | ICD-10-CM | POA: Diagnosis not present

## 2020-07-07 DIAGNOSIS — M6281 Muscle weakness (generalized): Secondary | ICD-10-CM | POA: Diagnosis not present

## 2020-07-07 DIAGNOSIS — R278 Other lack of coordination: Secondary | ICD-10-CM | POA: Diagnosis not present

## 2020-07-08 DIAGNOSIS — I613 Nontraumatic intracerebral hemorrhage in brain stem: Secondary | ICD-10-CM | POA: Diagnosis not present

## 2020-07-08 DIAGNOSIS — M6281 Muscle weakness (generalized): Secondary | ICD-10-CM | POA: Diagnosis not present

## 2020-07-08 DIAGNOSIS — J9601 Acute respiratory failure with hypoxia: Secondary | ICD-10-CM | POA: Diagnosis not present

## 2020-07-08 DIAGNOSIS — R2689 Other abnormalities of gait and mobility: Secondary | ICD-10-CM | POA: Diagnosis not present

## 2020-07-08 DIAGNOSIS — R278 Other lack of coordination: Secondary | ICD-10-CM | POA: Diagnosis not present

## 2020-07-08 DIAGNOSIS — R2681 Unsteadiness on feet: Secondary | ICD-10-CM | POA: Diagnosis not present

## 2020-07-08 DIAGNOSIS — R1312 Dysphagia, oropharyngeal phase: Secondary | ICD-10-CM | POA: Diagnosis not present

## 2020-07-09 DIAGNOSIS — R2689 Other abnormalities of gait and mobility: Secondary | ICD-10-CM | POA: Diagnosis not present

## 2020-07-09 DIAGNOSIS — M6281 Muscle weakness (generalized): Secondary | ICD-10-CM | POA: Diagnosis not present

## 2020-07-09 DIAGNOSIS — R2681 Unsteadiness on feet: Secondary | ICD-10-CM | POA: Diagnosis not present

## 2020-07-09 DIAGNOSIS — J9601 Acute respiratory failure with hypoxia: Secondary | ICD-10-CM | POA: Diagnosis not present

## 2020-07-09 DIAGNOSIS — I613 Nontraumatic intracerebral hemorrhage in brain stem: Secondary | ICD-10-CM | POA: Diagnosis not present

## 2020-07-09 DIAGNOSIS — R278 Other lack of coordination: Secondary | ICD-10-CM | POA: Diagnosis not present

## 2020-07-09 DIAGNOSIS — R1312 Dysphagia, oropharyngeal phase: Secondary | ICD-10-CM | POA: Diagnosis not present

## 2020-07-10 DIAGNOSIS — R278 Other lack of coordination: Secondary | ICD-10-CM | POA: Diagnosis not present

## 2020-07-10 DIAGNOSIS — R2681 Unsteadiness on feet: Secondary | ICD-10-CM | POA: Diagnosis not present

## 2020-07-10 DIAGNOSIS — I613 Nontraumatic intracerebral hemorrhage in brain stem: Secondary | ICD-10-CM | POA: Diagnosis not present

## 2020-07-10 DIAGNOSIS — M6281 Muscle weakness (generalized): Secondary | ICD-10-CM | POA: Diagnosis not present

## 2020-07-10 DIAGNOSIS — J9601 Acute respiratory failure with hypoxia: Secondary | ICD-10-CM | POA: Diagnosis not present

## 2020-07-10 DIAGNOSIS — R2689 Other abnormalities of gait and mobility: Secondary | ICD-10-CM | POA: Diagnosis not present

## 2020-07-10 DIAGNOSIS — R1312 Dysphagia, oropharyngeal phase: Secondary | ICD-10-CM | POA: Diagnosis not present

## 2020-07-13 DIAGNOSIS — R2681 Unsteadiness on feet: Secondary | ICD-10-CM | POA: Diagnosis not present

## 2020-07-13 DIAGNOSIS — R1312 Dysphagia, oropharyngeal phase: Secondary | ICD-10-CM | POA: Diagnosis not present

## 2020-07-13 DIAGNOSIS — M6281 Muscle weakness (generalized): Secondary | ICD-10-CM | POA: Diagnosis not present

## 2020-07-13 DIAGNOSIS — R278 Other lack of coordination: Secondary | ICD-10-CM | POA: Diagnosis not present

## 2020-07-13 DIAGNOSIS — J9601 Acute respiratory failure with hypoxia: Secondary | ICD-10-CM | POA: Diagnosis not present

## 2020-07-13 DIAGNOSIS — I613 Nontraumatic intracerebral hemorrhage in brain stem: Secondary | ICD-10-CM | POA: Diagnosis not present

## 2020-07-13 DIAGNOSIS — R2689 Other abnormalities of gait and mobility: Secondary | ICD-10-CM | POA: Diagnosis not present

## 2020-07-22 DIAGNOSIS — L89152 Pressure ulcer of sacral region, stage 2: Secondary | ICD-10-CM | POA: Diagnosis not present

## 2020-07-22 DIAGNOSIS — N319 Neuromuscular dysfunction of bladder, unspecified: Secondary | ICD-10-CM | POA: Diagnosis not present

## 2020-07-23 DIAGNOSIS — E559 Vitamin D deficiency, unspecified: Secondary | ICD-10-CM | POA: Diagnosis not present

## 2020-07-23 DIAGNOSIS — E119 Type 2 diabetes mellitus without complications: Secondary | ICD-10-CM | POA: Diagnosis not present

## 2020-07-23 DIAGNOSIS — E785 Hyperlipidemia, unspecified: Secondary | ICD-10-CM | POA: Diagnosis not present

## 2020-07-23 DIAGNOSIS — N390 Urinary tract infection, site not specified: Secondary | ICD-10-CM | POA: Diagnosis not present

## 2020-07-23 DIAGNOSIS — R2681 Unsteadiness on feet: Secondary | ICD-10-CM | POA: Diagnosis not present

## 2020-08-18 DIAGNOSIS — N319 Neuromuscular dysfunction of bladder, unspecified: Secondary | ICD-10-CM | POA: Diagnosis not present

## 2020-08-21 DIAGNOSIS — M2041 Other hammer toe(s) (acquired), right foot: Secondary | ICD-10-CM | POA: Diagnosis not present

## 2020-08-21 DIAGNOSIS — I739 Peripheral vascular disease, unspecified: Secondary | ICD-10-CM | POA: Diagnosis not present

## 2020-08-21 DIAGNOSIS — M2042 Other hammer toe(s) (acquired), left foot: Secondary | ICD-10-CM | POA: Diagnosis not present

## 2020-08-21 DIAGNOSIS — B351 Tinea unguium: Secondary | ICD-10-CM | POA: Diagnosis not present

## 2020-08-25 DIAGNOSIS — I6789 Other cerebrovascular disease: Secondary | ICD-10-CM | POA: Diagnosis not present

## 2020-08-25 DIAGNOSIS — Z79899 Other long term (current) drug therapy: Secondary | ICD-10-CM | POA: Diagnosis not present

## 2020-08-25 DIAGNOSIS — N390 Urinary tract infection, site not specified: Secondary | ICD-10-CM | POA: Diagnosis not present

## 2020-08-25 DIAGNOSIS — F482 Pseudobulbar affect: Secondary | ICD-10-CM | POA: Diagnosis not present

## 2020-08-25 DIAGNOSIS — E440 Moderate protein-calorie malnutrition: Secondary | ICD-10-CM | POA: Diagnosis not present

## 2020-08-25 DIAGNOSIS — Z978 Presence of other specified devices: Secondary | ICD-10-CM | POA: Diagnosis not present

## 2020-08-25 DIAGNOSIS — D6489 Other specified anemias: Secondary | ICD-10-CM | POA: Diagnosis not present

## 2020-08-25 DIAGNOSIS — R1319 Other dysphagia: Secondary | ICD-10-CM | POA: Diagnosis not present

## 2020-08-26 DIAGNOSIS — R338 Other retention of urine: Secondary | ICD-10-CM | POA: Diagnosis not present

## 2020-08-27 ENCOUNTER — Non-Acute Institutional Stay: Payer: Medicare Other | Admitting: Student

## 2020-08-27 ENCOUNTER — Other Ambulatory Visit: Payer: Self-pay

## 2020-08-27 DIAGNOSIS — Z515 Encounter for palliative care: Secondary | ICD-10-CM

## 2020-08-27 NOTE — Progress Notes (Signed)
[facility] Consult Note Telephone: [phone]  Fax: [phone]  PATIENT NAME: Beth Foster [address] [place] [address] [phone] (home)  DOB: [birth year] MRN: [id number]  PRIMARY CARE PROVIDER:    Dr. Keenan Bachelor  REFERRING PROVIDER:   Dr. Keenan Bachelor  RESPONSIBLE PARTY:   Extended Emergency Contact Information Primary Emergency Contact: [place] Phone: [phone] Mobile Phone: [phone] Relation: Granddaughter  I met face to face with patient in the facility.  ASSESSMENT AND RECOMMENDATIONS:   Advance Care Planning: Visit at the request of Dr. Keenan Bachelor for palliative consult. Visit consisted of building trust and discussions on Palliative care medicine as specialized medical care for people living with serious illness, aimed at facilitating improved quality of life through symptoms relief, assisting with advance care planning and establishing goals of care. Palliative care will continue to provide support to patient, family and the medical team.  Goal of care: Maintain quality of life, symptom management.  Directives: DNR  Symptom Management:   Late Effect CVA-generalized weakness, patient requires assistance with all adl's. Staff to continue assisting with adl's, redirect/reorient as needed.    Urinary retention-patient with chronic indwelling foley catheter d/t urinary retention. Continue tamsulosin as directed. Foley catheter care per facility directions. Monitor for s/s of urinary tract infection.  Appetite/protein calorie malnutrition-patient with poor to fair appetite; requires assistance with meals. Continue routine weights, Medpass supplement QID, mirtazapine 104m QHS.  Follow up Palliative Care Visit: Palliative care will continue to follow for complex decision making and symptom management. Return in 8 weeks or prn.  Family /Caregiver/Community Supports: Palliative  Medicine will continue to provide support.   I spent 35 minutes providing this consultation, time includes time spent with patient/family, chart review, provider coordination, and documentation. More than 50% of the time in this consultation was spent counseling and coordinating communication.   CHIEF COMPLAINT: Palliative Medicine follow up visit.  History obtained from review of EMR, discussion with primary team. Records reviewed and summarized below.  HISTORY OF PRESENT ILLNESS:  Beth FLAMMis a 87y.o. year old female with multiple medical problems including HTN, hx of CVA, glaucoma, macular degeneration, anxiety, protein-calorie malnutrition, hx of recurrent UTI's. Palliative Care was asked to follow this patient by consultation request of Dr. BKeenan Bachelorto help address advance care planning and goals of care. This is a follow up visit.  Patient requires assistance with all adl's. She is out of bed daily to w/c. Nursing staff report patient being stable; tearful episodes. She is able to be redirected. Patient denies pain, shortness of breath. Poor to fair appetite reported; receives medpass nutritional supplement QID. Chronic indwelling foley catheter d/t urinary retention. No recent falls reported. No recent ER visits or hospitalizations.  CODE STATUS: DNR  PPS: 30%  HOSPICE ELIGIBILITY/DIAGNOSIS: TBD  ROS   General: NAD HEENT: denies dysphagia Cardiovascular: denies chest pain Pulmonary: denies cough, denies increased SOB Abdomen:  incontinence of bowel GU: denies dysuria, indwelling foley catheter MSK: limitations, no falls reported Skin: denies rashes or wounds Neurological: endorses weakness, denies pain, denies insomnia Psych: tearful at times Heme/lymph/immuno: denies bruises, abnormal bleeding   Physical Exam: Weight: 93 pounds Pulse 64, resp 16, sats 97% on room air Constitutional: NAD General: frail appearing, thin EYES: anicteric sclera, lids intact, no discharge   ENMT: intact hearing,oral mucous membranes moist CV: RRR, no LE edema Pulmonary: LCTA, no increased work of breathing, no cough Abdomen: BS normoactive x 4 GU:  foley catheter present; clear, yellow urine MSK: sarcopenia, non ambulatory Skin: warm and dry, no rashes or wounds on visible skin Neuro: Generalized weakness, cognitive impairment Psych: non-anxious affect today Hem/lymph/immuno: no widespread bruising   PAST MEDICAL HISTORY:  Past Medical History:  Diagnosis Date  . Amaurosis fugax   . Anxiety   . Anxiety state, unspecified   . Bunion   . Cataracts, bilateral   . Sherran Needs syndrome   . Cognitive decline   . Contact dermatitis and other eczema due to other chemical products   . Edema   . External hemorrhoids without mention of complication   . GERD (gastroesophageal reflux disease)   . Glaucoma   . Hypertension   . Macular degeneration of both eyes   . Malignant neoplasm of breast (female), unspecified site dx'd 1996   rt breast; xrt   . Pain in joint, pelvic region and thigh   . Rash and other nonspecific skin eruption   . Stroke (cerebrum) ([place])    with decrease in [facility] of right hand.   . Stroke ([place])   . Unspecified cataract   . Unspecified late effects of cerebrovascular disease   . Viral hepatitis A without mention of hepatic coma   . Viral hepatitis A without mention of hepatic coma   . Vitamin D deficiency     SOCIAL HX:  Social History   Tobacco Use  . Smoking status: Never Smoker  . Smokeless tobacco: Never Used  Substance Use Topics  . Alcohol use: Never   FAMILY HX:  Family History  Problem Relation Age of Onset  . Cancer Mother        lung  . Macular degeneration Brother   . Cancer Brother        brain  . Varicose Veins Father   . Varicose Veins Sister     ALLERGIES:  Allergies  Allergen Reactions  . Amoxicillin Anaphylaxis and Other (See Comments)    Has patient had a PCN reaction causing immediate rash,  facial/tongue/throat swelling, SOB or lightheadedness with hypotension: Yes Has patient had a PCN reaction causing severe rash involving mucus membranes or skin necrosis: No Has patient had a PCN reaction that required hospitalization No Has patient had a PCN reaction occurring within the last 10 years: No If all of the above answers are "NO", then may proceed with Cephalosporin use.  . Angiotensin Receptor Blockers Anaphylaxis, Itching and Rash  . Beta Adrenergic Blockers Anaphylaxis, Itching and Rash  . Cephalexin Anaphylaxis  . Clindamycin/Lincomycin Anaphylaxis, Itching and Rash  . Hydrocortisone Other (See Comments)    Reaction to cream - causes blisters  . Prednisone Other (See Comments)    Causes heart to race  . Eggs Or Egg-Derived Products Other (See Comments)    Reaction:  Blisters in mouth   . Tape Other (See Comments)    Reaction:  Pulls skin off   . Amoxicillin Itching and Rash    Did it involve swelling of the face/tongue/throat, SOB, or low BP? No Did it involve sudden or severe rash/hives, skin peeling, or any reaction on the inside of your mouth or nose? Yes Did you need to seek medical attention at a hospital or doctor's office? No When did it last happen?87 years old If all above answers are "NO", may proceed with cephalosporin use.  . Clindamycin/Lincomycin Itching and Rash  . Corticosteroids Itching and Rash  . Hydrocortisone Itching and Rash  . Keflex [Cephalexin] Itching and Rash  . Latex  Itching and Rash  . Latex Rash  . Other Itching, Rash and Other (See Comments)    Pt states that she has a pine allergy and she is only able to use fragrance free soaps and laundry products.    . Reglan [Metoclopramide] Itching and Rash     PERTINENT MEDICATIONS:  Outpatient Encounter Medications as of 08/27/2020  Medication Sig  . acetaminophen (TYLENOL) 325 MG tablet Take 325 mg by mouth every 6 (six) hours as needed for mild pain or headache.  Marland Kitchen atorvastatin  (LIPITOR) 10 MG tablet Take 1 tablet (10 mg total) by mouth daily.  . cholecalciferol (VITAMIN D) 1000 UNITS tablet Take 2,000 Units by mouth daily.   Marland Kitchen diltiazem (CARDIZEM CD) 180 MG 24 hr capsule Take 1 capsule (180 mg total) by mouth at bedtime.  Marland Kitchen estradiol (ESTRACE) 0.1 MG/GM vaginal cream Place 1 Applicatorful vaginally 3 (three) times a week.  . feeding supplement, ENSURE ENLIVE, (ENSURE ENLIVE) LIQD Take 237 mLs by mouth 3 (three) times daily between meals.  . fluconazole (DIFLUCAN) 100 MG tablet Take 1 tablet (100 mg total) by mouth daily.  Marland Kitchen latanoprost (XALATAN) 0.005 % ophthalmic solution Place 1 drop into both eyes at bedtime.  . lidocaine (LIDODERM) 5 % Place 1 patch onto the skin daily. Remove & Discard patch within 12 hours or as directed by MD  . Melatonin 3 MG TABS Take 0.5 tablets (1.5 mg total) by mouth at bedtime.  . Menthol-Methyl Salicylate (MUSCLE RUB) 10-15 % CREA Apply 1 application topically 4 (four) times daily -  before meals and at bedtime.  . Multiple Vitamins-Minerals (EYE VITAMINS PO) Take 1 tablet by mouth daily.  . pantoprazole (PROTONIX) 40 MG tablet Take 1 tablet (40 mg total) by mouth daily.  . polyethylene glycol (MIRALAX / GLYCOLAX) 17 g packet Take 17 g by mouth daily.  Marland Kitchen senna (SENOKOT) 8.6 MG TABS tablet Take 2 tablets (17.2 mg total) by mouth at bedtime.  . sodium phosphate (FLEET) 7-19 GM/118ML ENEM Place 133 mLs (1 enema total) rectally daily as needed for severe constipation.  . tamsulosin (FLOMAX) 0.4 MG CAPS capsule Take 2 capsules (0.8 mg total) by mouth daily after supper.  . witch hazel-glycerin (TUCKS) pad Apply topically as needed for itching.   No facility-administered encounter medications on file as of 08/27/2020.     Thank you for the opportunity to participate in the care of Ms. Grennan. The palliative care team will continue to follow. Please call our office at [phone] if we can be of additional assistance.  Ezekiel Slocumb, NP

## 2020-09-03 DIAGNOSIS — H353111 Nonexudative age-related macular degeneration, right eye, early dry stage: Secondary | ICD-10-CM | POA: Diagnosis not present

## 2020-09-03 DIAGNOSIS — H2513 Age-related nuclear cataract, bilateral: Secondary | ICD-10-CM | POA: Diagnosis not present

## 2020-09-03 DIAGNOSIS — H353122 Nonexudative age-related macular degeneration, left eye, intermediate dry stage: Secondary | ICD-10-CM | POA: Diagnosis not present

## 2020-09-03 DIAGNOSIS — H401134 Primary open-angle glaucoma, bilateral, indeterminate stage: Secondary | ICD-10-CM | POA: Diagnosis not present

## 2020-09-15 DIAGNOSIS — N319 Neuromuscular dysfunction of bladder, unspecified: Secondary | ICD-10-CM | POA: Diagnosis not present

## 2020-10-20 ENCOUNTER — Other Ambulatory Visit: Payer: Self-pay

## 2020-10-20 ENCOUNTER — Non-Acute Institutional Stay: Payer: Medicare Other | Admitting: Student

## 2020-10-20 DIAGNOSIS — R339 Retention of urine, unspecified: Secondary | ICD-10-CM

## 2020-10-20 DIAGNOSIS — I6930 Unspecified sequelae of cerebral infarction: Secondary | ICD-10-CM

## 2020-10-20 DIAGNOSIS — J029 Acute pharyngitis, unspecified: Secondary | ICD-10-CM

## 2020-10-20 DIAGNOSIS — Z515 Encounter for palliative care: Secondary | ICD-10-CM

## 2020-10-20 DIAGNOSIS — E440 Moderate protein-calorie malnutrition: Secondary | ICD-10-CM

## 2020-10-20 NOTE — Progress Notes (Signed)
Therapist, nutritional Palliative Care Consult Note Telephone: [phone]  Fax: [phone]    Date of encounter: 10/20/20 PATIENT NAME: Beth Foster [address] [place] [address]   [phone] (home)  DOB: June 13, 1928 MRN: [id number] PRIMARY CARE PROVIDER:    Karna Dupes, MD,  [address] [place] [address] [phone]  REFERRING PROVIDER:   Karna Dupes, MD [address] [place],  [place] [address] [phone]  RESPONSIBLE PARTY:    Contact Information    Name Relation Home Work Mobile   Pantazahias,Anna Granddaughter [phone]  [phone]       I met face to face with patient and family in the facility. Palliative Care was asked to follow this patient by consultation request of  Karna Dupes, MD to address advance care planning and complex medical decision making. This is a follow up visit.                                   ASSESSMENT AND PLAN / RECOMMENDATIONS:   Advance Care Planning/Goals of Care: Goals include to maximize quality of life and symptom management. Our advance care planning conversation included a discussion about:     The value and importance of advance care planning   Experiences with loved ones who have been seriously ill or have died   Exploration of personal, cultural or spiritual beliefs that might influence medical decisions   Exploration of goals of care in the event of a sudden injury or illness   CODE STATUS: DNR  Will follow up with family regarding Palliative visit. Review goals of care.  Symptom Management/Plan:  Late Effect CVA-generalized weakness, patient requires assistance with all adl's. Staff to continue assisting with adl's, redirect/reorient as needed.    Urinary retention-patient with chronic indwelling foley catheter d/t urinary retention. Continue tamsulosin as directed. Foley catheter care per facility directions. Monitor for s/s of urinary tract infection. Family is  inquiring about suprapubic catheter placement. Will discuss risk vs. Benefits.   Appetite/protein calorie malnutrition-patient with poor to fair appetite; requires assistance with meals. Continue routine weights, Medpass supplement QID, mirtazapine 15mg  QHS.   Sore throat-patient complaint of sore throat today. Patient afebrile. No other symptoms. Staff will monitor for any changes.   Follow up Palliative Care Visit: Palliative care will continue to follow for complex medical decision making, advance care planning, and clarification of goals. Return in 8 weeks or prn.  I spent 30 minutes providing this consultation. More than 50% of the time in this consultation was spent in counseling and care coordination.    PPS: 30%  HOSPICE ELIGIBILITY/DIAGNOSIS: TBD  Chief Complaint: Palliative Medicine follow up visit.   HISTORY OF PRESENT ILLNESS:  Beth Foster is a 87 y.o. year old female  with late effect CVA, protein calorie malnutrition, glaucoma, macular degeneration, anxiety, hx of recurrent UTI's.   Patient currently resides at [facility]. Patient requires assistance with all adl's. She is out of bed daily to w/c. Nursing staff report patient being stable. She is able to be redirected when having tearful episodes. Patient denies pain, shortness of breath. Poor to fair appetite reported; receives medpass nutritional supplement QID. Chronic indwelling foley catheter d/t urinary retention. Family is inquiring about a suprapubic catheter to be placed; has consulted with Urology. No recent falls reported. No recent ER visits or hospitalizations.   History obtained from review of EMR,  discussion with primary team, and interview with family, facility staff/caregiver and/or Beth Foster.  I reviewed available labs, medications, imaging, studies and related documents from the EMR.  Records reviewed and summarized above.   ROS  General: NAD HEENT: reports sore throat today, denies  dysphagia Cardiovascular: denies chest pain Pulmonary: denies cough, denies increased SOB Abdomen: incontinence of bowel GU: denies dysuria, indwelling foley catheter MSK: weakness, non-ambulatory, no falls reported Skin: denies rashes or wounds Neurological: denies pain, denies insomnia Psych: Endorses positive mood Heme/lymph/immuno: denies bruises, abnormal bleeding  Physical Exam: Weight: 90.1 pounds Pulse 72, resp 16, b/p 110/50.  Constitutional: NAD General: frail appearing, thin EYES: anicteric sclera, lids intact, no discharge  ENMT: intact hearing, oral mucous membranes moist, no erythema to posterior pharynx CV: S1S2, RRR, no LE edema Pulmonary: LCTA, no increased work of breathing, no cough, room air Abdomen: normo-active BS + 4 quadrants, soft and non tender GU: yellow urine in drainage bag MSK: sarcopenia, non ambulatory Skin: warm and dry, no rashes or wounds on visible skin Neuro: generalized weakness, cognitive impairment Psych: non-anxious affect, pleasant mood Hem/lymph/immuno: no widespread bruising   Thank you for the opportunity to participate in the care of Beth Foster.  The palliative care team will continue to follow. Please call our office at [phone] if we can be of additional assistance.   Ezekiel Slocumb, NP   COVID-19 PATIENT SCREENING TOOL Asked and negative response unless otherwise noted:   Have you had symptoms of covid, tested positive or been in contact with someone with symptoms/positive test in the past 5-10 days? No

## 2020-10-22 DIAGNOSIS — R634 Abnormal weight loss: Secondary | ICD-10-CM | POA: Diagnosis not present

## 2020-10-22 DIAGNOSIS — I10 Essential (primary) hypertension: Secondary | ICD-10-CM | POA: Diagnosis not present

## 2020-10-23 ENCOUNTER — Telehealth: Payer: Self-pay | Admitting: Student

## 2020-10-23 NOTE — Telephone Encounter (Signed)
Palliative NP left message for POA, grand daughter Vicente Males to follow up on palliative visit. Awaiting return call.

## 2020-10-30 DIAGNOSIS — I10 Essential (primary) hypertension: Secondary | ICD-10-CM | POA: Diagnosis not present

## 2020-10-30 DIAGNOSIS — Z01818 Encounter for other preprocedural examination: Secondary | ICD-10-CM | POA: Diagnosis not present

## 2020-10-31 DIAGNOSIS — D649 Anemia, unspecified: Secondary | ICD-10-CM | POA: Diagnosis not present

## 2020-11-02 DIAGNOSIS — R6889 Other general symptoms and signs: Secondary | ICD-10-CM | POA: Diagnosis not present

## 2020-11-03 DIAGNOSIS — N390 Urinary tract infection, site not specified: Secondary | ICD-10-CM | POA: Diagnosis not present

## 2020-11-03 DIAGNOSIS — I10 Essential (primary) hypertension: Secondary | ICD-10-CM | POA: Diagnosis not present

## 2020-11-11 DIAGNOSIS — N319 Neuromuscular dysfunction of bladder, unspecified: Secondary | ICD-10-CM | POA: Diagnosis not present

## 2020-11-25 DIAGNOSIS — Z01818 Encounter for other preprocedural examination: Secondary | ICD-10-CM | POA: Diagnosis not present

## 2020-11-25 DIAGNOSIS — N390 Urinary tract infection, site not specified: Secondary | ICD-10-CM | POA: Diagnosis not present

## 2020-11-25 DIAGNOSIS — R338 Other retention of urine: Secondary | ICD-10-CM | POA: Diagnosis not present

## 2020-12-02 ENCOUNTER — Other Ambulatory Visit: Payer: Self-pay | Admitting: Urology

## 2020-12-07 DIAGNOSIS — N390 Urinary tract infection, site not specified: Secondary | ICD-10-CM | POA: Diagnosis not present

## 2020-12-08 DIAGNOSIS — N319 Neuromuscular dysfunction of bladder, unspecified: Secondary | ICD-10-CM | POA: Diagnosis not present

## 2020-12-08 NOTE — Progress Notes (Signed)
COVID Vaccine Completed: Date COVID Vaccine completed: Has received booster: COVID vaccine manufacturer: Sherrill  Date of COVID positive in last 90 days:  PCP - Raymondo Band, MD Cardiologist -   Chest x-ray - greater than 1 year in epic EKG - greater than 1 year in epic Stress Test -  ECHO - 03/09/2019 in epic Cardiac Cath -  Pacemaker/ICD device last checked:  Sleep Study -  CPAP -   Fasting Blood Sugar -  Checks Blood Sugar _____ times a day  Blood Thinner Instructions: Aspirin Instructions: Last Dose:  Activity level:  Unable to go up a flight of stairs without symptoms   Can go up a flight of stairs and activities of daily living without stopping and without symptoms   Able to exercise without symptoms     Anesthesia review: History of Stroke, HTN  Patient denies shortness of breath, fever, cough and chest pain at PAT appointment   Patient verbalized understanding of instructions that were given to them at the PAT appointment. Patient was also instructed that they will need to review over the PAT instructions again at home before surgery.

## 2020-12-14 ENCOUNTER — Encounter (HOSPITAL_COMMUNITY): Payer: Self-pay | Admitting: Urology

## 2020-12-14 NOTE — Progress Notes (Signed)
COVID Vaccine Completed: Yes x4 Date COVID Vaccine completed: 05-20-19 06-17-19 Has received booster: 04-09-20 10-08-20 COVID vaccine manufacturer:     Moderna     Date of COVID positive in last 90 days:  Negative Covid test on 11-05-20 on chart   PCP - Raymondo Band, MD Cardiologist -   Medical clearance on chart dated 11-25-20 Earmon Phoenix, AGNP-C  Chest x-ray - 11/02/20 on chart EKG - 11/02/20 on chart Stress Test - ECHO - 03/09/2019 in epic Cardiac Cath - Pacemaker/ICD device last checked:   Sleep Study - CPAP -   Fasting Blood Sugar - Checks Blood Sugar _____ times a day   Blood Thinner Instructions: Aspirin Instructions: Last Dose:   Activity level:   Patient resides in nursing home and will travel by wheelchair.   Has gait issues.                   Anesthesia review: History of Stroke, HTN   Patient denies shortness of breath, fever, cough and chest pain at PAT appointment     Patient verbalized understanding of instructions that were given to them at the PAT appointment. Patient was also instructed that they will need to review over the PAT instructions again at home before surgery.

## 2020-12-14 NOTE — Progress Notes (Signed)
Preop instructions for:     Beth Foster Date of Birth:         08/19/1928              Date of Procedure:   12-22-20 Procedure:     Cystoscopy with suprapubic tube placement Surgeon: Aleen Campi, [place] contact:   Orland Dec  Phone:     [phone] ext [place]: Sharlyne Pacas [phone] RN contact name/phone#:        [phone]                  and Fax #: [phone]   Transportation contact phone#:  Lynnell Dike [phone]    Time to arrive at [facility]: 9:00 AM on 12-22-20   Report to: Admitting (On your left hand side)    Do not eat or drink past midnight the night before your procedure.(To include any tube feedings-must be discontinued)   Take these morning medications only with sips of water.(or give through gastrostomy or feeding tube).  Amlodipine, Atorvastatin, Diltiazem, Lexapro, Pepcid   The Day Before Surgery:  N/A   Note: No Insulin or Diabetic meds should be given or taken the morning of the procedure!     Please send day of procedure:current med list and meds last taken that day, confirm nothing by mouth status from what time, Patient Demographic info( to include DNR status, problem list, allergies)   Bring Insurance card and picture ID Leave all jewelry and other valuables at place where living( no metal or rings to be worn) No contact lens Women-no make-up, no lotions,perfumes,powders   Any questions day of procedure,call  SHORT STAY-[phone]     Sent from :[facility]                   Phone:[phone]                   Fax:[phone]   Sent by :    Norvel Richards, RN

## 2020-12-21 DIAGNOSIS — L603 Nail dystrophy: Secondary | ICD-10-CM | POA: Diagnosis not present

## 2020-12-21 DIAGNOSIS — B351 Tinea unguium: Secondary | ICD-10-CM | POA: Diagnosis not present

## 2020-12-21 DIAGNOSIS — I739 Peripheral vascular disease, unspecified: Secondary | ICD-10-CM | POA: Diagnosis not present

## 2020-12-22 ENCOUNTER — Ambulatory Visit (HOSPITAL_COMMUNITY): Payer: Medicare Other | Admitting: Physician Assistant

## 2020-12-22 ENCOUNTER — Encounter (HOSPITAL_COMMUNITY): Payer: Self-pay | Admitting: Urology

## 2020-12-22 ENCOUNTER — Ambulatory Visit (HOSPITAL_COMMUNITY)
Admission: RE | Admit: 2020-12-22 | Discharge: 2020-12-22 | Disposition: A | Discharge status: Skilled Nursing Facility | Payer: Medicare Other | Attending: Urology | Admitting: Urology

## 2020-12-22 ENCOUNTER — Encounter (HOSPITAL_COMMUNITY)
Admission: RE | Disposition: A | Discharge status: Skilled Nursing Facility | Payer: Self-pay | Source: Home / Self Care | Attending: Urology

## 2020-12-22 DIAGNOSIS — Z853 Personal history of malignant neoplasm of breast: Secondary | ICD-10-CM | POA: Insufficient documentation

## 2020-12-22 DIAGNOSIS — N390 Urinary tract infection, site not specified: Secondary | ICD-10-CM | POA: Diagnosis not present

## 2020-12-22 DIAGNOSIS — Z880 Allergy status to penicillin: Secondary | ICD-10-CM | POA: Insufficient documentation

## 2020-12-22 DIAGNOSIS — Z801 Family history of malignant neoplasm of trachea, bronchus and lung: Secondary | ICD-10-CM | POA: Diagnosis not present

## 2020-12-22 DIAGNOSIS — Z8719 Personal history of other diseases of the digestive system: Secondary | ICD-10-CM | POA: Diagnosis not present

## 2020-12-22 DIAGNOSIS — Z809 Family history of malignant neoplasm, unspecified: Secondary | ICD-10-CM | POA: Insufficient documentation

## 2020-12-22 DIAGNOSIS — H409 Unspecified glaucoma: Secondary | ICD-10-CM | POA: Insufficient documentation

## 2020-12-22 DIAGNOSIS — Z91040 Latex allergy status: Secondary | ICD-10-CM | POA: Insufficient documentation

## 2020-12-22 DIAGNOSIS — N319 Neuromuscular dysfunction of bladder, unspecified: Secondary | ICD-10-CM | POA: Insufficient documentation

## 2020-12-22 DIAGNOSIS — Z881 Allergy status to other antibiotic agents status: Secondary | ICD-10-CM | POA: Insufficient documentation

## 2020-12-22 DIAGNOSIS — N210 Calculus in bladder: Secondary | ICD-10-CM | POA: Insufficient documentation

## 2020-12-22 DIAGNOSIS — Z9049 Acquired absence of other specified parts of digestive tract: Secondary | ICD-10-CM | POA: Insufficient documentation

## 2020-12-22 DIAGNOSIS — F0150 Vascular dementia without behavioral disturbance: Secondary | ICD-10-CM | POA: Diagnosis not present

## 2020-12-22 DIAGNOSIS — E785 Hyperlipidemia, unspecified: Secondary | ICD-10-CM | POA: Insufficient documentation

## 2020-12-22 DIAGNOSIS — Z888 Allergy status to other drugs, medicaments and biological substances status: Secondary | ICD-10-CM | POA: Insufficient documentation

## 2020-12-22 DIAGNOSIS — Z87440 Personal history of urinary (tract) infections: Secondary | ICD-10-CM | POA: Diagnosis not present

## 2020-12-22 DIAGNOSIS — Z8673 Personal history of transient ischemic attack (TIA), and cerebral infarction without residual deficits: Secondary | ICD-10-CM | POA: Insufficient documentation

## 2020-12-22 DIAGNOSIS — I10 Essential (primary) hypertension: Secondary | ICD-10-CM | POA: Insufficient documentation

## 2020-12-22 DIAGNOSIS — E559 Vitamin D deficiency, unspecified: Secondary | ICD-10-CM | POA: Diagnosis not present

## 2020-12-22 HISTORY — DX: Prediabetes: R73.03

## 2020-12-22 HISTORY — DX: Vascular dementia, unspecified severity, without behavioral disturbance, psychotic disturbance, mood disturbance, and anxiety: F01.50

## 2020-12-22 HISTORY — DX: Dysphagia, unspecified: R13.10

## 2020-12-22 HISTORY — DX: Hyperlipidemia, unspecified: E78.5

## 2020-12-22 HISTORY — PX: CYSTOSCOPY: SHX5120

## 2020-12-22 LAB — CBC
HCT: 36 % (ref 36.0–46.0)
Hemoglobin: 10.9 g/dL — ABNORMAL LOW (ref 12.0–15.0)
MCH: 29.6 pg (ref 26.0–34.0)
MCHC: 30.3 g/dL (ref 30.0–36.0)
MCV: 97.8 fL (ref 80.0–100.0)
Platelets: 197 10*3/uL (ref 150–400)
RBC: 3.68 MIL/uL — ABNORMAL LOW (ref 3.87–5.11)
RDW: 14.6 % (ref 11.5–15.5)
WBC: 3.4 10*3/uL — ABNORMAL LOW (ref 4.0–10.5)
nRBC: 0 % (ref 0.0–0.2)

## 2020-12-22 LAB — BASIC METABOLIC PANEL
Anion gap: 9 (ref 5–15)
BUN: 10 mg/dL (ref 8–23)
CO2: 28 mmol/L (ref 22–32)
Calcium: 9.5 mg/dL (ref 8.9–10.3)
Chloride: 102 mmol/L (ref 98–111)
Creatinine, Ser: 0.5 mg/dL (ref 0.44–1.00)
GFR, Estimated: >60 mL/min (ref >60)
Glucose, Bld: 103 mg/dL — ABNORMAL HIGH (ref 70–99)
Potassium: 4.2 mmol/L (ref 3.5–5.1)
Sodium: 139 mmol/L (ref 135–145)

## 2020-12-22 LAB — HEMOGLOBIN A1C
Hgb A1c MFr Bld: 5.8 % — ABNORMAL HIGH (ref 4.8–5.6)
Mean Plasma Glucose: 119.76 mg/dL

## 2020-12-22 SURGERY — CYSTOSCOPY
Anesthesia: General | Site: Bladder

## 2020-12-22 MED ORDER — ORAL CARE MOUTH RINSE: 15.0000 mL | Freq: Once | OROMUCOSAL | Status: AC

## 2020-12-22 MED ORDER — SUGAMMADEX SODIUM 200 MG/2ML IV SOLN
INTRAVENOUS | Status: DC | PRN
  Administered 2020-12-22: 60 mg via INTRAVENOUS

## 2020-12-22 MED ORDER — GENTAMICIN IN SALINE 1-0.9 MG/ML-% IV SOLN
100.0000 mg | Freq: Once | INTRAVENOUS | Status: AC
  Administered 2020-12-22: 100 mg via INTRAVENOUS
  Filled 2020-12-22: qty 100

## 2020-12-22 MED ORDER — ACETAMINOPHEN 10 MG/ML IV SOLN
INTRAVENOUS | Status: DC | PRN
  Administered 2020-12-22: 477 mg via INTRAVENOUS

## 2020-12-22 MED ORDER — PROPOFOL 10 MG/ML IV BOLUS
INTRAVENOUS | Status: AC
  Filled 2020-12-22: qty 20

## 2020-12-22 MED ORDER — ONDANSETRON HCL 4 MG/2ML IJ SOLN
INTRAMUSCULAR | Status: DC | PRN
  Administered 2020-12-22: 3 mg via INTRAVENOUS

## 2020-12-22 MED ORDER — ONDANSETRON HCL 4 MG/2ML IJ SOLN: INTRAMUSCULAR | Status: DC | PRN

## 2020-12-22 MED ORDER — SODIUM CHLORIDE 0.9 % IR SOLN
Status: DC | PRN
  Administered 2020-12-22: 6000 mL via INTRAVESICAL

## 2020-12-22 MED ORDER — FENTANYL CITRATE (PF) 100 MCG/2ML IJ SOLN
INTRAMUSCULAR | Status: DC | PRN
  Administered 2020-12-22 (×2): 25 ug via INTRAVENOUS

## 2020-12-22 MED ORDER — ROCURONIUM BROMIDE 10 MG/ML (PF) SYRINGE
PREFILLED_SYRINGE | INTRAVENOUS | Status: DC | PRN
  Administered 2020-12-22: 40 mg via INTRAVENOUS

## 2020-12-22 MED ORDER — PROPOFOL 10 MG/ML IV BOLUS
INTRAVENOUS | Status: DC | PRN
  Administered 2020-12-22: 50 mg via INTRAVENOUS

## 2020-12-22 MED ORDER — LACTATED RINGERS IV SOLN: INTRAVENOUS | Status: DC

## 2020-12-22 MED ORDER — FENTANYL CITRATE (PF) 100 MCG/2ML IJ SOLN
INTRAMUSCULAR | Status: AC
  Filled 2020-12-22: qty 2

## 2020-12-22 MED ORDER — CHLORHEXIDINE GLUCONATE 0.12 % MT SOLN
15.0000 mL | Freq: Once | OROMUCOSAL | Status: AC
  Administered 2020-12-22: 15 mL via OROMUCOSAL

## 2020-12-22 MED ORDER — FENTANYL CITRATE (PF) 100 MCG/2ML IJ SOLN: 25.0000 ug | INTRAMUSCULAR | Status: DC | PRN

## 2020-12-22 MED ORDER — ACETAMINOPHEN 10 MG/ML IV SOLN: INTRAVENOUS | Status: DC | PRN

## 2020-12-22 MED ORDER — LIDOCAINE 2% (20 MG/ML) 5 ML SYRINGE
INTRAMUSCULAR | Status: DC | PRN
  Administered 2020-12-22: 40 mg via INTRAVENOUS

## 2020-12-22 MED ORDER — GENTAMICIN SULFATE 40 MG/ML IJ SOLN: 5.0000 mg/kg | INTRAVENOUS | Status: DC

## 2020-12-22 MED ORDER — WATER FOR IRRIGATION, STERILE IR SOLN: Status: DC | PRN

## 2020-12-22 MED ORDER — ACETAMINOPHEN 10 MG/ML IV SOLN
INTRAVENOUS | Status: AC
  Filled 2020-12-22: qty 100

## 2020-12-22 SURGICAL SUPPLY — 23 items
BAG URINE DRAIN 2000ML AR STRL (UROLOGICAL SUPPLIES) ×2 IMPLANT
BAG URO CATCHER STRL LF (MISCELLANEOUS) ×2 IMPLANT
CATH FOLEY INTRO SUPRA 16F (CATHETERS) ×2 IMPLANT
CATH SILICONE 5CC 18FR (INSTRUMENTS) ×2 IMPLANT
CATH URET 5FR 28IN OPEN ENDED (CATHETERS) IMPLANT
CLOTH BEACON ORANGE TIMEOUT ST (SAFETY) ×2 IMPLANT
ELECT PENCIL ROCKER SW 15FT (MISCELLANEOUS) ×2 IMPLANT
ELECT REM PT RETURN 15FT ADLT (MISCELLANEOUS) ×2 IMPLANT
GAUZE 4X4 16PLY ~~LOC~~+RFID DBL (SPONGE) ×2 IMPLANT
GLOVE SURG ENC TEXT LTX SZ7.5 (GLOVE) IMPLANT
GLOVE SURG POLYISO LF SZ7.5 (GLOVE) ×4 IMPLANT
GOWN STRL REUS W/TWL XL LVL3 (GOWN DISPOSABLE) ×2 IMPLANT
GUIDEWIRE STR DUAL SENSOR (WIRE) IMPLANT
GUIDEWIRE ZIPWRE .038 STRAIGHT (WIRE) IMPLANT
KIT TURNOVER KIT A (KITS) ×2 IMPLANT
MANIFOLD NEPTUNE II (INSTRUMENTS) ×2 IMPLANT
NEEDLE SPNL 18GX3.5 QUINCKE PK (NEEDLE) ×2 IMPLANT
PACK CYSTO (CUSTOM PROCEDURE TRAY) ×2 IMPLANT
SPONGE DRAIN TRACH 4X4 STRL 2S (GAUZE/BANDAGES/DRESSINGS) ×2 IMPLANT
SUT SILK 0 FSL (SUTURE) ×2 IMPLANT
SYR TOOMEY IRRIG 70ML (MISCELLANEOUS) ×2
SYRINGE TOOMEY IRRIG 70ML (MISCELLANEOUS) ×1 IMPLANT
TUBING CONNECTING 10 (TUBING) ×2 IMPLANT

## 2020-12-22 NOTE — H&P (Signed)
Urology Preoperative H&P   Chief Complaint:  urinary retention   History of Present Illness: Beth Foster is a 87 y.o. female with a history of CVA, dementia, neurogenic bladder and recurrent UTIs. Her most recent urine cultures have grown multidrug-resistant Proteus mirabilis and ESBL.   -Patient is here today to discuss suprapubic tube placement in hopes of avoiding as frequent UTIs.  -Granddaughter Vicente Males [facility]) is present during today's visit  -Indwelling Foley since 2020  -Tremendous amount of anxiety centered around catheter exchanges  -Poor quality of life due to pain from catheter and recurrent UTIs  Past Medical History:  Diagnosis Date   Amaurosis fugax    Anxiety    Anxiety state, unspecified    Bunion    Cataracts, bilateral    Charles Bonnet syndrome    Cognitive decline    Contact dermatitis and other eczema due to other chemical products    Dysphagia    Edema    External hemorrhoids without mention of complication    GERD (gastroesophageal reflux disease)    Glaucoma    Hyperlipidemia    Hypertension    Macular degeneration of both eyes    Malignant neoplasm of breast (female), unspecified site dx'd 1996   rt breast; xrt    Pain in joint, pelvic region and thigh    Pre-diabetes    Rash and other nonspecific skin eruption    Stroke (cerebrum) ([place])    with decrease in [facility] [place] [facility] of right hand.    Stroke [facility])    Unspecified cataract    Unspecified late effects of cerebrovascular disease    Vascular dementia ([place])    Viral hepatitis A without mention of hepatic coma    Viral hepatitis A without mention of hepatic coma    Vitamin D deficiency     Past Surgical History:  Procedure Laterality Date   BREAST LUMPECTOMY  1995   CARDIAC CATHETERIZATION  2004   CHOLECYSTECTOMY  1972    Allergies:  Allergies  Allergen Reactions   Amoxicillin Anaphylaxis and Other (See Comments)    Has patient had a PCN reaction causing immediate rash, facial/tongue/throat swelling,  SOB or lightheadedness with hypotension: Yes Has patient had a PCN reaction causing severe rash involving mucus membranes or skin necrosis: No Has patient had a PCN reaction that required hospitalization No Has patient had a PCN reaction occurring within the last 10 years: No If all of the above answers are "NO", then may proceed with Cephalosporin use.   Angiotensin Receptor Blockers Anaphylaxis, Itching and Rash   Beta Adrenergic Blockers Anaphylaxis, Itching and Rash   Cephalexin Anaphylaxis, Itching and Rash   Clindamycin/Lincomycin Anaphylaxis, Itching and Rash   Hydrocortisone Other (See Comments)    Reaction to cream - causes blisters   Prednisone Other (See Comments)    Causes heart to race   Eggs Or Egg-Derived Products Other (See Comments)    Reaction:  Blisters in mouth    Tape Other (See Comments)    Reaction:  Pulls skin off    Amoxicillin Itching and Rash    Did it involve swelling of the face/tongue/throat, SOB, or low BP? No Did it involve sudden or severe rash/hives, skin peeling, or any reaction on the inside of your mouth or nose? Yes Did you need to seek medical attention at a hospital or doctor's office? No When did it last happen?      87 years old If all above answers are "NO", may proceed with cephalosporin use.  Corticosteroids Itching and Rash   Hydrocortisone Itching and Rash   Latex Itching and Rash   Other Itching, Rash and Other (See Comments)    Pt states that she has a pine allergy and she is only able to use fragrance free soaps and laundry products.     Reglan [Metoclopramide] Itching and Rash    Family History  Problem Relation Age of Onset   Cancer Mother        lung   Macular degeneration Brother    Cancer Brother        brain   Varicose Veins Father    Varicose Veins Sister     Social History:  reports that she has never smoked. She has never used smokeless tobacco. She reports that she does not drink alcohol and does not use  drugs.  ROS: A complete review of systems was performed.  All systems are negative except for pertinent findings as noted.  Physical Exam:  Vital signs in last 24 hours: Temp:  [98.3 F (36.8 C)] 98.3 F (36.8 C) (08/02 1026) Pulse Rate:  [66] 66 (08/02 1026) Resp:  [18] 18 (08/02 1026) BP: (126)/(51) 126/51 (08/02 1026) SpO2:  [96 %] 96 % (08/02 1026) Weight:  [31.8 kg] 31.8 kg (08/02 1013) Constitutional:  Alert but disriented, No acute distress Cardiovascular: Regular rate and rhythm, No JVD Respiratory: Normal respiratory effort, Lungs clear bilaterally GI: Abdomen is soft, nontender, nondistended, no abdominal masses GU: No CVA tenderness Lymphatic: No lymphadenopathy Neurologic: Grossly intact, no focal deficits Psychiatric: Normal mood and affect  Laboratory Data:  Recent Labs    12/22/20 1038  WBC 3.4*  HGB 10.9*  HCT 36.0  PLT 197    Recent Labs    12/22/20 1038  NA 139  K 4.2  CL 102  GLUCOSE 103*  BUN 10  CALCIUM 9.5  CREATININE 0.50     Results for orders placed or performed during the hospital encounter of 12/22/20 (from the past 24 hour(s))  Basic metabolic panel per protocol     Status: Abnormal   Collection Time: 12/22/20 10:38 AM  Result Value Ref Range   Sodium 139 135 - 145 mmol/L   Potassium 4.2 3.5 - 5.1 mmol/L   Chloride 102 98 - 111 mmol/L   CO2 28 22 - 32 mmol/L   Glucose, Bld 103 (H) 70 - 99 mg/dL   BUN 10 8 - 23 mg/dL   Creatinine, Ser 0.50 0.44 - 1.00 mg/dL   Calcium 9.5 8.9 - 10.3 mg/dL   GFR, Estimated >60 >60 mL/min   Anion gap 9 5 - 15  CBC per protocol     Status: Abnormal   Collection Time: 12/22/20 10:38 AM  Result Value Ref Range   WBC 3.4 (L) 4.0 - 10.5 K/uL   RBC 3.68 (L) 3.87 - 5.11 MIL/uL   Hemoglobin 10.9 (L) 12.0 - 15.0 g/dL   HCT 36.0 36.0 - 46.0 %   MCV 97.8 80.0 - 100.0 fL   MCH 29.6 26.0 - 34.0 pg   MCHC 30.3 30.0 - 36.0 g/dL   RDW 14.6 11.5 - 15.5 %   Platelets 197 150 - 400 K/uL   nRBC 0.0 0.0 -  0.2 %   No results found for this or any previous visit (from the past 240 hour(s)).  Renal Function: Recent Labs    12/22/20 1038  CREATININE 0.50   Estimated Creatinine Clearance: 23.5 mL/min (by C-G formula based on SCr of 0.5 mg/dL).  Radiologic Imaging: No results found.  I independently reviewed the above imaging studies.  Assessment and Plan Beth Foster is a 87 y.o. female with urinary retention and recurrent UTIs  The risk, benefits and alternatives of cystoscopy with suprapubic tube placement was discussed with the patient's granddaughter and the patient. Risks include, but are not limited to, bleeding, skin infection, urinary tract infection, bladder injury, persistent UTIs, pain around the catheter insertion site, leakage around the catheter insertion site, MI, CVA, DVT and the inherent risk of general anesthesia. The patient's granddaughter, who is her power of attorney, voices understanding and wishes to proceed.   Ellison Hughs, MD 12/22/2020, 12:06 PM  [facility] Pager: [phone]

## 2020-12-22 NOTE — Anesthesia Preprocedure Evaluation (Addendum)
Anesthesia Evaluation  Patient identified by MRN, date of birth, ID band Patient awake    Reviewed: Allergy & Precautions, H&P , NPO status , Patient's Chart, lab work & pertinent test results  Airway Mallampati: II  TM Distance: >3 FB Neck ROM: Full    Dental no notable dental hx. (+) Edentulous Upper, Edentulous Lower, Dental Advisory Given   Pulmonary neg pulmonary ROS,    Pulmonary exam normal breath sounds clear to auscultation       Cardiovascular hypertension, Pt. on medications + DOE   Rhythm:Regular Rate:Normal     Neuro/Psych Anxiety Dementia CVA, Residual Symptoms    GI/Hepatic Neg liver ROS, GERD  Medicated,  Endo/Other  negative endocrine ROS  Renal/GU negative Renal ROS  negative genitourinary   Musculoskeletal   Abdominal   Peds  Hematology negative hematology ROS (+)   Anesthesia Other Findings   Reproductive/Obstetrics negative OB ROS                            Anesthesia Physical Anesthesia Plan  ASA: 3  Anesthesia Plan: General   Post-op Pain Management:    Induction: Intravenous  PONV Risk Score and Plan: 4 or greater and Ondansetron and Dexamethasone  Airway Management Planned: Oral ETT  Additional Equipment:   Intra-op Plan:   Post-operative Plan: Extubation in OR  Informed Consent: I have reviewed the patients History and Physical, chart, labs and discussed the procedure including the risks, benefits and alternatives for the proposed anesthesia with the patient or authorized representative who has indicated his/her understanding and acceptance.     Dental advisory given  Plan Discussed with: CRNA  Anesthesia Plan Comments:        Anesthesia Quick Evaluation

## 2020-12-22 NOTE — Op Note (Signed)
Operative Note  Preoperative diagnosis:  1.  Neurogenic bladder  Postoperative diagnosis: 1.  Neurogenic bladder 2.  Multiple 1 cm bladder stones  Procedure(s): 1.  Cystoscopy with suprapubic tube placement 2.  Evacuation of bladder stones  Surgeon: Ellison Hughs, MD  Assistants:  None  Anesthesia:  General  Complications:  None  EBL: 5 mm  Specimens: 1.  Bladder stones  Drains/Catheters: 1.  18 French suprapubic tube  Intraoperative findings:   No intravesical lesions were noted.  She was found to have multiple 1 cm bladder stones that were evacuated through the sheath of the cystoscope.  Indication:  Beth Foster is a 87 y.o. female with a neurogenic bladder requiring an indwelling Foley catheter.  She has severe anxiety with catheter exchanges along with recurrent ESBL UTIs.  She is here today for suprapubic tube placement.  The patient and her granddaughter have been consented for the above procedures, voices understanding and wished to proceed.  Description of procedure:  After informed consent was obtained, the patient was brought to the operating room and general LMA anesthesia was administered. The patient was then placed in the dorsolithotomy position and prepped and draped in the usual sterile fashion. A timeout was performed. A 23 French rigid cystoscope was then inserted into the urethral meatus and advanced into the bladder under direct vision. A complete bladder survey revealed no intravesical pathology.  She was noted to have multiple small 1 cm bladder stones that were siphoned out of the bladder through the sheath of the cystoscope.  The bladder was then filled to capacity.  A spinal needle was then inserted approximately 2 fingerbreadths superior to the pubic symphysis in the midline and advanced into the bladder under direct cystoscopic vision.  A suprapubic tube trocar was then advanced along the same tract as the spinal needle and into the bladder,  under direct visualization.  An 73 French suprapubic tube was then advanced through the trocar and into the bladder.  A total of 10 mL of sterile water was then inflated into the suprapubic tube and it was placed to gravity drainage.  The tube was then secured with a 0 silk suture.  The patient tolerated procedure well and was transferred to the postanesthesia in stable condition.  Plan: Follow-up in 2 to 3 weeks to upsize her suprapubic tube and remove her drain stitch.

## 2020-12-22 NOTE — Anesthesia Procedure Notes (Signed)
Procedure Name: Intubation Date/Time: 12/22/2020 12:32 PM Performed by: Pilar Grammes, CRNA Pre-anesthesia Checklist: Patient identified, Emergency Drugs available, Suction available, Patient being monitored and Timeout performed Patient Re-evaluated:Patient Re-evaluated prior to induction Oxygen Delivery Method: Circle system utilized Preoxygenation: Pre-oxygenation with 100% oxygen Induction Type: IV induction Ventilation: Mask ventilation without difficulty Laryngoscope Size: Miller, 3 and 2 Grade View: Grade I Tube type: Oral Tube size: 7.0 mm Number of attempts: 1 Airway Equipment and Method: Stylet Placement Confirmation: positive ETCO2, ETT inserted through vocal cords under direct vision, CO2 detector and breath sounds checked- equal and bilateral Secured at: 20 cm Tube secured with: Tape Dental Injury: Teeth and Oropharynx as per pre-operative assessment

## 2020-12-22 NOTE — Transfer of Care (Signed)
Immediate Anesthesia Transfer of Care Note  Patient: Beth Foster  Procedure(s) Performed: CYSTOSCOPY WITH SUPRA PUBIC TUBE PLACEMENT (Bladder)  Patient Location: PACU  Anesthesia Type:General  Level of Consciousness: awake, sedated and patient cooperative  Airway & Oxygen Therapy: Patient connected to face mask oxygen  Post-op Assessment: Report given to RN and Post -op Vital signs reviewed and stable  Post vital signs: stable  Last Vitals:  Vitals Value Taken Time  BP 129/62 12/22/20 1315  Temp    Pulse 83 12/22/20 1316  Resp 18 12/22/20 1316  SpO2 98 % 12/22/20 1316  Vitals shown include unvalidated device data.  Last Pain:  Vitals:   12/22/20 1057  TempSrc:   PainSc: 0-No pain         Complications: No notable events documented.

## 2020-12-22 NOTE — Anesthesia Postprocedure Evaluation (Signed)
Anesthesia Post Note  Patient: Beth Foster  Procedure(s) Performed: CYSTOSCOPY WITH SUPRA PUBIC TUBE PLACEMENT (Bladder)     Patient location during evaluation: PACU Anesthesia Type: General Level of consciousness: awake and alert Pain management: pain level controlled Vital Signs Assessment: post-procedure vital signs reviewed and stable Respiratory status: spontaneous breathing, nonlabored ventilation and respiratory function stable Cardiovascular status: blood pressure returned to baseline and stable Postop Assessment: no apparent nausea or vomiting Anesthetic complications: no   No notable events documented.  Last Vitals:  Vitals:   12/22/20 1620 12/22/20 1645  BP:  (!) 106/51  Pulse: 63 61  Resp: 14 15  Temp:  36.5 C  SpO2: 94% 94%    Last Pain:  Vitals:   12/22/20 1645  TempSrc:   PainSc: 0-No pain                 Doryan Bahl,W. EDMOND

## 2020-12-23 ENCOUNTER — Encounter (HOSPITAL_COMMUNITY): Payer: Self-pay | Admitting: Urology

## 2020-12-23 DIAGNOSIS — Z9359 Other cystostomy status: Secondary | ICD-10-CM | POA: Diagnosis not present

## 2020-12-23 DIAGNOSIS — R339 Retention of urine, unspecified: Secondary | ICD-10-CM | POA: Diagnosis not present

## 2020-12-23 DIAGNOSIS — N210 Calculus in bladder: Secondary | ICD-10-CM | POA: Diagnosis not present

## 2020-12-25 DIAGNOSIS — Z9359 Other cystostomy status: Secondary | ICD-10-CM | POA: Diagnosis not present

## 2020-12-25 DIAGNOSIS — F482 Pseudobulbar affect: Secondary | ICD-10-CM | POA: Diagnosis not present

## 2020-12-25 DIAGNOSIS — I679 Cerebrovascular disease, unspecified: Secondary | ICD-10-CM | POA: Diagnosis not present

## 2021-01-05 DIAGNOSIS — N319 Neuromuscular dysfunction of bladder, unspecified: Secondary | ICD-10-CM | POA: Diagnosis not present

## 2021-01-05 DIAGNOSIS — N210 Calculus in bladder: Secondary | ICD-10-CM | POA: Diagnosis not present

## 2021-01-07 DIAGNOSIS — I613 Nontraumatic intracerebral hemorrhage in brain stem: Secondary | ICD-10-CM | POA: Diagnosis not present

## 2021-01-07 DIAGNOSIS — M6281 Muscle weakness (generalized): Secondary | ICD-10-CM | POA: Diagnosis not present

## 2021-01-07 DIAGNOSIS — R278 Other lack of coordination: Secondary | ICD-10-CM | POA: Diagnosis not present

## 2021-01-07 DIAGNOSIS — J9601 Acute respiratory failure with hypoxia: Secondary | ICD-10-CM | POA: Diagnosis not present

## 2021-01-07 DIAGNOSIS — R2689 Other abnormalities of gait and mobility: Secondary | ICD-10-CM | POA: Diagnosis not present

## 2021-01-07 DIAGNOSIS — R2681 Unsteadiness on feet: Secondary | ICD-10-CM | POA: Diagnosis not present

## 2021-01-07 DIAGNOSIS — R1312 Dysphagia, oropharyngeal phase: Secondary | ICD-10-CM | POA: Diagnosis not present

## 2021-01-07 DIAGNOSIS — I69319 Unspecified symptoms and signs involving cognitive functions following cerebral infarction: Secondary | ICD-10-CM | POA: Diagnosis not present

## 2021-01-08 DIAGNOSIS — J9601 Acute respiratory failure with hypoxia: Secondary | ICD-10-CM | POA: Diagnosis not present

## 2021-01-08 DIAGNOSIS — M6281 Muscle weakness (generalized): Secondary | ICD-10-CM | POA: Diagnosis not present

## 2021-01-08 DIAGNOSIS — I69319 Unspecified symptoms and signs involving cognitive functions following cerebral infarction: Secondary | ICD-10-CM | POA: Diagnosis not present

## 2021-01-08 DIAGNOSIS — R2689 Other abnormalities of gait and mobility: Secondary | ICD-10-CM | POA: Diagnosis not present

## 2021-01-08 DIAGNOSIS — R278 Other lack of coordination: Secondary | ICD-10-CM | POA: Diagnosis not present

## 2021-01-08 DIAGNOSIS — I613 Nontraumatic intracerebral hemorrhage in brain stem: Secondary | ICD-10-CM | POA: Diagnosis not present

## 2021-01-08 DIAGNOSIS — R2681 Unsteadiness on feet: Secondary | ICD-10-CM | POA: Diagnosis not present

## 2021-01-08 DIAGNOSIS — R1312 Dysphagia, oropharyngeal phase: Secondary | ICD-10-CM | POA: Diagnosis not present

## 2021-01-09 DIAGNOSIS — R2689 Other abnormalities of gait and mobility: Secondary | ICD-10-CM | POA: Diagnosis not present

## 2021-01-09 DIAGNOSIS — J9601 Acute respiratory failure with hypoxia: Secondary | ICD-10-CM | POA: Diagnosis not present

## 2021-01-09 DIAGNOSIS — I69319 Unspecified symptoms and signs involving cognitive functions following cerebral infarction: Secondary | ICD-10-CM | POA: Diagnosis not present

## 2021-01-09 DIAGNOSIS — I613 Nontraumatic intracerebral hemorrhage in brain stem: Secondary | ICD-10-CM | POA: Diagnosis not present

## 2021-01-09 DIAGNOSIS — M6281 Muscle weakness (generalized): Secondary | ICD-10-CM | POA: Diagnosis not present

## 2021-01-09 DIAGNOSIS — R278 Other lack of coordination: Secondary | ICD-10-CM | POA: Diagnosis not present

## 2021-01-09 DIAGNOSIS — R1312 Dysphagia, oropharyngeal phase: Secondary | ICD-10-CM | POA: Diagnosis not present

## 2021-01-09 DIAGNOSIS — R2681 Unsteadiness on feet: Secondary | ICD-10-CM | POA: Diagnosis not present

## 2021-01-12 DIAGNOSIS — J9601 Acute respiratory failure with hypoxia: Secondary | ICD-10-CM | POA: Diagnosis not present

## 2021-01-12 DIAGNOSIS — I613 Nontraumatic intracerebral hemorrhage in brain stem: Secondary | ICD-10-CM | POA: Diagnosis not present

## 2021-01-12 DIAGNOSIS — R2681 Unsteadiness on feet: Secondary | ICD-10-CM | POA: Diagnosis not present

## 2021-01-12 DIAGNOSIS — R1312 Dysphagia, oropharyngeal phase: Secondary | ICD-10-CM | POA: Diagnosis not present

## 2021-01-12 DIAGNOSIS — R278 Other lack of coordination: Secondary | ICD-10-CM | POA: Diagnosis not present

## 2021-01-12 DIAGNOSIS — I69319 Unspecified symptoms and signs involving cognitive functions following cerebral infarction: Secondary | ICD-10-CM | POA: Diagnosis not present

## 2021-01-12 DIAGNOSIS — R2689 Other abnormalities of gait and mobility: Secondary | ICD-10-CM | POA: Diagnosis not present

## 2021-01-12 DIAGNOSIS — M6281 Muscle weakness (generalized): Secondary | ICD-10-CM | POA: Diagnosis not present

## 2021-01-13 DIAGNOSIS — I69319 Unspecified symptoms and signs involving cognitive functions following cerebral infarction: Secondary | ICD-10-CM | POA: Diagnosis not present

## 2021-01-13 DIAGNOSIS — R2681 Unsteadiness on feet: Secondary | ICD-10-CM | POA: Diagnosis not present

## 2021-01-13 DIAGNOSIS — J9601 Acute respiratory failure with hypoxia: Secondary | ICD-10-CM | POA: Diagnosis not present

## 2021-01-13 DIAGNOSIS — R2689 Other abnormalities of gait and mobility: Secondary | ICD-10-CM | POA: Diagnosis not present

## 2021-01-13 DIAGNOSIS — M6281 Muscle weakness (generalized): Secondary | ICD-10-CM | POA: Diagnosis not present

## 2021-01-13 DIAGNOSIS — R1312 Dysphagia, oropharyngeal phase: Secondary | ICD-10-CM | POA: Diagnosis not present

## 2021-01-13 DIAGNOSIS — R278 Other lack of coordination: Secondary | ICD-10-CM | POA: Diagnosis not present

## 2021-01-13 DIAGNOSIS — I613 Nontraumatic intracerebral hemorrhage in brain stem: Secondary | ICD-10-CM | POA: Diagnosis not present

## 2021-01-14 DIAGNOSIS — R2681 Unsteadiness on feet: Secondary | ICD-10-CM | POA: Diagnosis not present

## 2021-01-14 DIAGNOSIS — R1312 Dysphagia, oropharyngeal phase: Secondary | ICD-10-CM | POA: Diagnosis not present

## 2021-01-14 DIAGNOSIS — I613 Nontraumatic intracerebral hemorrhage in brain stem: Secondary | ICD-10-CM | POA: Diagnosis not present

## 2021-01-14 DIAGNOSIS — R2689 Other abnormalities of gait and mobility: Secondary | ICD-10-CM | POA: Diagnosis not present

## 2021-01-14 DIAGNOSIS — I69319 Unspecified symptoms and signs involving cognitive functions following cerebral infarction: Secondary | ICD-10-CM | POA: Diagnosis not present

## 2021-01-14 DIAGNOSIS — J9601 Acute respiratory failure with hypoxia: Secondary | ICD-10-CM | POA: Diagnosis not present

## 2021-01-14 DIAGNOSIS — M6281 Muscle weakness (generalized): Secondary | ICD-10-CM | POA: Diagnosis not present

## 2021-01-14 DIAGNOSIS — R278 Other lack of coordination: Secondary | ICD-10-CM | POA: Diagnosis not present

## 2021-01-15 DIAGNOSIS — I69319 Unspecified symptoms and signs involving cognitive functions following cerebral infarction: Secondary | ICD-10-CM | POA: Diagnosis not present

## 2021-01-15 DIAGNOSIS — M6281 Muscle weakness (generalized): Secondary | ICD-10-CM | POA: Diagnosis not present

## 2021-01-15 DIAGNOSIS — R1312 Dysphagia, oropharyngeal phase: Secondary | ICD-10-CM | POA: Diagnosis not present

## 2021-01-15 DIAGNOSIS — R2689 Other abnormalities of gait and mobility: Secondary | ICD-10-CM | POA: Diagnosis not present

## 2021-01-15 DIAGNOSIS — J9601 Acute respiratory failure with hypoxia: Secondary | ICD-10-CM | POA: Diagnosis not present

## 2021-01-15 DIAGNOSIS — R278 Other lack of coordination: Secondary | ICD-10-CM | POA: Diagnosis not present

## 2021-01-15 DIAGNOSIS — I613 Nontraumatic intracerebral hemorrhage in brain stem: Secondary | ICD-10-CM | POA: Diagnosis not present

## 2021-01-15 DIAGNOSIS — R2681 Unsteadiness on feet: Secondary | ICD-10-CM | POA: Diagnosis not present

## 2021-01-18 DIAGNOSIS — R2689 Other abnormalities of gait and mobility: Secondary | ICD-10-CM | POA: Diagnosis not present

## 2021-01-18 DIAGNOSIS — R2681 Unsteadiness on feet: Secondary | ICD-10-CM | POA: Diagnosis not present

## 2021-01-18 DIAGNOSIS — M6281 Muscle weakness (generalized): Secondary | ICD-10-CM | POA: Diagnosis not present

## 2021-01-18 DIAGNOSIS — I69319 Unspecified symptoms and signs involving cognitive functions following cerebral infarction: Secondary | ICD-10-CM | POA: Diagnosis not present

## 2021-01-18 DIAGNOSIS — I613 Nontraumatic intracerebral hemorrhage in brain stem: Secondary | ICD-10-CM | POA: Diagnosis not present

## 2021-01-18 DIAGNOSIS — R1312 Dysphagia, oropharyngeal phase: Secondary | ICD-10-CM | POA: Diagnosis not present

## 2021-01-18 DIAGNOSIS — J9601 Acute respiratory failure with hypoxia: Secondary | ICD-10-CM | POA: Diagnosis not present

## 2021-01-18 DIAGNOSIS — R278 Other lack of coordination: Secondary | ICD-10-CM | POA: Diagnosis not present

## 2021-01-19 DIAGNOSIS — I613 Nontraumatic intracerebral hemorrhage in brain stem: Secondary | ICD-10-CM | POA: Diagnosis not present

## 2021-01-19 DIAGNOSIS — I69319 Unspecified symptoms and signs involving cognitive functions following cerebral infarction: Secondary | ICD-10-CM | POA: Diagnosis not present

## 2021-01-19 DIAGNOSIS — R2689 Other abnormalities of gait and mobility: Secondary | ICD-10-CM | POA: Diagnosis not present

## 2021-01-19 DIAGNOSIS — R1312 Dysphagia, oropharyngeal phase: Secondary | ICD-10-CM | POA: Diagnosis not present

## 2021-01-19 DIAGNOSIS — R278 Other lack of coordination: Secondary | ICD-10-CM | POA: Diagnosis not present

## 2021-01-19 DIAGNOSIS — M6281 Muscle weakness (generalized): Secondary | ICD-10-CM | POA: Diagnosis not present

## 2021-01-19 DIAGNOSIS — J9601 Acute respiratory failure with hypoxia: Secondary | ICD-10-CM | POA: Diagnosis not present

## 2021-01-19 DIAGNOSIS — R2681 Unsteadiness on feet: Secondary | ICD-10-CM | POA: Diagnosis not present

## 2021-01-20 DIAGNOSIS — R2681 Unsteadiness on feet: Secondary | ICD-10-CM | POA: Diagnosis not present

## 2021-01-20 DIAGNOSIS — R2689 Other abnormalities of gait and mobility: Secondary | ICD-10-CM | POA: Diagnosis not present

## 2021-01-20 DIAGNOSIS — I69319 Unspecified symptoms and signs involving cognitive functions following cerebral infarction: Secondary | ICD-10-CM | POA: Diagnosis not present

## 2021-01-20 DIAGNOSIS — M6281 Muscle weakness (generalized): Secondary | ICD-10-CM | POA: Diagnosis not present

## 2021-01-20 DIAGNOSIS — R278 Other lack of coordination: Secondary | ICD-10-CM | POA: Diagnosis not present

## 2021-01-20 DIAGNOSIS — I613 Nontraumatic intracerebral hemorrhage in brain stem: Secondary | ICD-10-CM | POA: Diagnosis not present

## 2021-01-20 DIAGNOSIS — J9601 Acute respiratory failure with hypoxia: Secondary | ICD-10-CM | POA: Diagnosis not present

## 2021-01-20 DIAGNOSIS — R1312 Dysphagia, oropharyngeal phase: Secondary | ICD-10-CM | POA: Diagnosis not present

## 2021-01-21 DIAGNOSIS — R2689 Other abnormalities of gait and mobility: Secondary | ICD-10-CM | POA: Diagnosis not present

## 2021-01-21 DIAGNOSIS — M6281 Muscle weakness (generalized): Secondary | ICD-10-CM | POA: Diagnosis not present

## 2021-01-21 DIAGNOSIS — J9601 Acute respiratory failure with hypoxia: Secondary | ICD-10-CM | POA: Diagnosis not present

## 2021-01-21 DIAGNOSIS — I613 Nontraumatic intracerebral hemorrhage in brain stem: Secondary | ICD-10-CM | POA: Diagnosis not present

## 2021-01-21 DIAGNOSIS — H3530 Unspecified macular degeneration: Secondary | ICD-10-CM | POA: Diagnosis not present

## 2021-01-21 DIAGNOSIS — I69319 Unspecified symptoms and signs involving cognitive functions following cerebral infarction: Secondary | ICD-10-CM | POA: Diagnosis not present

## 2021-01-21 DIAGNOSIS — R278 Other lack of coordination: Secondary | ICD-10-CM | POA: Diagnosis not present

## 2021-01-21 DIAGNOSIS — R2681 Unsteadiness on feet: Secondary | ICD-10-CM | POA: Diagnosis not present

## 2021-01-21 DIAGNOSIS — R1312 Dysphagia, oropharyngeal phase: Secondary | ICD-10-CM | POA: Diagnosis not present

## 2021-01-22 DIAGNOSIS — M6281 Muscle weakness (generalized): Secondary | ICD-10-CM | POA: Diagnosis not present

## 2021-01-22 DIAGNOSIS — R2681 Unsteadiness on feet: Secondary | ICD-10-CM | POA: Diagnosis not present

## 2021-01-22 DIAGNOSIS — R278 Other lack of coordination: Secondary | ICD-10-CM | POA: Diagnosis not present

## 2021-01-22 DIAGNOSIS — R1312 Dysphagia, oropharyngeal phase: Secondary | ICD-10-CM | POA: Diagnosis not present

## 2021-01-22 DIAGNOSIS — R2689 Other abnormalities of gait and mobility: Secondary | ICD-10-CM | POA: Diagnosis not present

## 2021-01-22 DIAGNOSIS — I69319 Unspecified symptoms and signs involving cognitive functions following cerebral infarction: Secondary | ICD-10-CM | POA: Diagnosis not present

## 2021-01-22 DIAGNOSIS — I613 Nontraumatic intracerebral hemorrhage in brain stem: Secondary | ICD-10-CM | POA: Diagnosis not present

## 2021-01-22 DIAGNOSIS — H3530 Unspecified macular degeneration: Secondary | ICD-10-CM | POA: Diagnosis not present

## 2021-01-22 DIAGNOSIS — J9601 Acute respiratory failure with hypoxia: Secondary | ICD-10-CM | POA: Diagnosis not present

## 2021-01-25 DIAGNOSIS — J9601 Acute respiratory failure with hypoxia: Secondary | ICD-10-CM | POA: Diagnosis not present

## 2021-01-25 DIAGNOSIS — R1312 Dysphagia, oropharyngeal phase: Secondary | ICD-10-CM | POA: Diagnosis not present

## 2021-01-25 DIAGNOSIS — I69319 Unspecified symptoms and signs involving cognitive functions following cerebral infarction: Secondary | ICD-10-CM | POA: Diagnosis not present

## 2021-01-25 DIAGNOSIS — R2681 Unsteadiness on feet: Secondary | ICD-10-CM | POA: Diagnosis not present

## 2021-01-25 DIAGNOSIS — R278 Other lack of coordination: Secondary | ICD-10-CM | POA: Diagnosis not present

## 2021-01-25 DIAGNOSIS — M6281 Muscle weakness (generalized): Secondary | ICD-10-CM | POA: Diagnosis not present

## 2021-01-25 DIAGNOSIS — R2689 Other abnormalities of gait and mobility: Secondary | ICD-10-CM | POA: Diagnosis not present

## 2021-01-25 DIAGNOSIS — H3530 Unspecified macular degeneration: Secondary | ICD-10-CM | POA: Diagnosis not present

## 2021-01-25 DIAGNOSIS — I613 Nontraumatic intracerebral hemorrhage in brain stem: Secondary | ICD-10-CM | POA: Diagnosis not present

## 2021-01-26 DIAGNOSIS — J9601 Acute respiratory failure with hypoxia: Secondary | ICD-10-CM | POA: Diagnosis not present

## 2021-01-26 DIAGNOSIS — R531 Weakness: Secondary | ICD-10-CM | POA: Diagnosis not present

## 2021-01-26 DIAGNOSIS — R278 Other lack of coordination: Secondary | ICD-10-CM | POA: Diagnosis not present

## 2021-01-26 DIAGNOSIS — I69319 Unspecified symptoms and signs involving cognitive functions following cerebral infarction: Secondary | ICD-10-CM | POA: Diagnosis not present

## 2021-01-26 DIAGNOSIS — Z8679 Personal history of other diseases of the circulatory system: Secondary | ICD-10-CM | POA: Diagnosis not present

## 2021-01-26 DIAGNOSIS — R2681 Unsteadiness on feet: Secondary | ICD-10-CM | POA: Diagnosis not present

## 2021-01-26 DIAGNOSIS — M6281 Muscle weakness (generalized): Secondary | ICD-10-CM | POA: Diagnosis not present

## 2021-01-26 DIAGNOSIS — R2689 Other abnormalities of gait and mobility: Secondary | ICD-10-CM | POA: Diagnosis not present

## 2021-01-26 DIAGNOSIS — R1312 Dysphagia, oropharyngeal phase: Secondary | ICD-10-CM | POA: Diagnosis not present

## 2021-01-26 DIAGNOSIS — Z9359 Other cystostomy status: Secondary | ICD-10-CM | POA: Diagnosis not present

## 2021-01-26 DIAGNOSIS — I613 Nontraumatic intracerebral hemorrhage in brain stem: Secondary | ICD-10-CM | POA: Diagnosis not present

## 2021-01-26 DIAGNOSIS — H3530 Unspecified macular degeneration: Secondary | ICD-10-CM | POA: Diagnosis not present

## 2021-01-26 DIAGNOSIS — R2990 Unspecified symptoms and signs involving the nervous system: Secondary | ICD-10-CM | POA: Diagnosis not present

## 2021-01-26 DIAGNOSIS — I10 Essential (primary) hypertension: Secondary | ICD-10-CM | POA: Diagnosis not present

## 2021-01-27 DIAGNOSIS — J9601 Acute respiratory failure with hypoxia: Secondary | ICD-10-CM | POA: Diagnosis not present

## 2021-01-27 DIAGNOSIS — I69319 Unspecified symptoms and signs involving cognitive functions following cerebral infarction: Secondary | ICD-10-CM | POA: Diagnosis not present

## 2021-01-27 DIAGNOSIS — M6281 Muscle weakness (generalized): Secondary | ICD-10-CM | POA: Diagnosis not present

## 2021-01-27 DIAGNOSIS — R2681 Unsteadiness on feet: Secondary | ICD-10-CM | POA: Diagnosis not present

## 2021-01-27 DIAGNOSIS — I613 Nontraumatic intracerebral hemorrhage in brain stem: Secondary | ICD-10-CM | POA: Diagnosis not present

## 2021-01-27 DIAGNOSIS — H3530 Unspecified macular degeneration: Secondary | ICD-10-CM | POA: Diagnosis not present

## 2021-01-27 DIAGNOSIS — R2689 Other abnormalities of gait and mobility: Secondary | ICD-10-CM | POA: Diagnosis not present

## 2021-01-27 DIAGNOSIS — R278 Other lack of coordination: Secondary | ICD-10-CM | POA: Diagnosis not present

## 2021-01-27 DIAGNOSIS — R1312 Dysphagia, oropharyngeal phase: Secondary | ICD-10-CM | POA: Diagnosis not present

## 2021-01-28 DIAGNOSIS — H3530 Unspecified macular degeneration: Secondary | ICD-10-CM | POA: Diagnosis not present

## 2021-01-28 DIAGNOSIS — R2681 Unsteadiness on feet: Secondary | ICD-10-CM | POA: Diagnosis not present

## 2021-01-28 DIAGNOSIS — M6281 Muscle weakness (generalized): Secondary | ICD-10-CM | POA: Diagnosis not present

## 2021-01-28 DIAGNOSIS — I69319 Unspecified symptoms and signs involving cognitive functions following cerebral infarction: Secondary | ICD-10-CM | POA: Diagnosis not present

## 2021-01-28 DIAGNOSIS — J9601 Acute respiratory failure with hypoxia: Secondary | ICD-10-CM | POA: Diagnosis not present

## 2021-01-28 DIAGNOSIS — R278 Other lack of coordination: Secondary | ICD-10-CM | POA: Diagnosis not present

## 2021-01-28 DIAGNOSIS — R2689 Other abnormalities of gait and mobility: Secondary | ICD-10-CM | POA: Diagnosis not present

## 2021-01-28 DIAGNOSIS — R1312 Dysphagia, oropharyngeal phase: Secondary | ICD-10-CM | POA: Diagnosis not present

## 2021-01-28 DIAGNOSIS — I613 Nontraumatic intracerebral hemorrhage in brain stem: Secondary | ICD-10-CM | POA: Diagnosis not present

## 2021-01-29 DIAGNOSIS — R2689 Other abnormalities of gait and mobility: Secondary | ICD-10-CM | POA: Diagnosis not present

## 2021-01-29 DIAGNOSIS — H3530 Unspecified macular degeneration: Secondary | ICD-10-CM | POA: Diagnosis not present

## 2021-01-29 DIAGNOSIS — J9601 Acute respiratory failure with hypoxia: Secondary | ICD-10-CM | POA: Diagnosis not present

## 2021-01-29 DIAGNOSIS — R2681 Unsteadiness on feet: Secondary | ICD-10-CM | POA: Diagnosis not present

## 2021-01-29 DIAGNOSIS — R1312 Dysphagia, oropharyngeal phase: Secondary | ICD-10-CM | POA: Diagnosis not present

## 2021-01-29 DIAGNOSIS — I613 Nontraumatic intracerebral hemorrhage in brain stem: Secondary | ICD-10-CM | POA: Diagnosis not present

## 2021-01-29 DIAGNOSIS — R278 Other lack of coordination: Secondary | ICD-10-CM | POA: Diagnosis not present

## 2021-01-29 DIAGNOSIS — M6281 Muscle weakness (generalized): Secondary | ICD-10-CM | POA: Diagnosis not present

## 2021-01-29 DIAGNOSIS — I69319 Unspecified symptoms and signs involving cognitive functions following cerebral infarction: Secondary | ICD-10-CM | POA: Diagnosis not present

## 2021-02-01 DIAGNOSIS — I69319 Unspecified symptoms and signs involving cognitive functions following cerebral infarction: Secondary | ICD-10-CM | POA: Diagnosis not present

## 2021-02-01 DIAGNOSIS — R278 Other lack of coordination: Secondary | ICD-10-CM | POA: Diagnosis not present

## 2021-02-01 DIAGNOSIS — R2681 Unsteadiness on feet: Secondary | ICD-10-CM | POA: Diagnosis not present

## 2021-02-01 DIAGNOSIS — R1312 Dysphagia, oropharyngeal phase: Secondary | ICD-10-CM | POA: Diagnosis not present

## 2021-02-01 DIAGNOSIS — R2689 Other abnormalities of gait and mobility: Secondary | ICD-10-CM | POA: Diagnosis not present

## 2021-02-01 DIAGNOSIS — M6281 Muscle weakness (generalized): Secondary | ICD-10-CM | POA: Diagnosis not present

## 2021-02-01 DIAGNOSIS — J9601 Acute respiratory failure with hypoxia: Secondary | ICD-10-CM | POA: Diagnosis not present

## 2021-02-01 DIAGNOSIS — I613 Nontraumatic intracerebral hemorrhage in brain stem: Secondary | ICD-10-CM | POA: Diagnosis not present

## 2021-02-01 DIAGNOSIS — H3530 Unspecified macular degeneration: Secondary | ICD-10-CM | POA: Diagnosis not present

## 2021-02-02 DIAGNOSIS — H3530 Unspecified macular degeneration: Secondary | ICD-10-CM | POA: Diagnosis not present

## 2021-02-02 DIAGNOSIS — R278 Other lack of coordination: Secondary | ICD-10-CM | POA: Diagnosis not present

## 2021-02-02 DIAGNOSIS — R2681 Unsteadiness on feet: Secondary | ICD-10-CM | POA: Diagnosis not present

## 2021-02-02 DIAGNOSIS — M6281 Muscle weakness (generalized): Secondary | ICD-10-CM | POA: Diagnosis not present

## 2021-02-02 DIAGNOSIS — R1312 Dysphagia, oropharyngeal phase: Secondary | ICD-10-CM | POA: Diagnosis not present

## 2021-02-02 DIAGNOSIS — I69319 Unspecified symptoms and signs involving cognitive functions following cerebral infarction: Secondary | ICD-10-CM | POA: Diagnosis not present

## 2021-02-02 DIAGNOSIS — I613 Nontraumatic intracerebral hemorrhage in brain stem: Secondary | ICD-10-CM | POA: Diagnosis not present

## 2021-02-02 DIAGNOSIS — R2689 Other abnormalities of gait and mobility: Secondary | ICD-10-CM | POA: Diagnosis not present

## 2021-02-02 DIAGNOSIS — J9601 Acute respiratory failure with hypoxia: Secondary | ICD-10-CM | POA: Diagnosis not present

## 2021-02-03 DIAGNOSIS — J9601 Acute respiratory failure with hypoxia: Secondary | ICD-10-CM | POA: Diagnosis not present

## 2021-02-03 DIAGNOSIS — I613 Nontraumatic intracerebral hemorrhage in brain stem: Secondary | ICD-10-CM | POA: Diagnosis not present

## 2021-02-03 DIAGNOSIS — R1312 Dysphagia, oropharyngeal phase: Secondary | ICD-10-CM | POA: Diagnosis not present

## 2021-02-03 DIAGNOSIS — R2689 Other abnormalities of gait and mobility: Secondary | ICD-10-CM | POA: Diagnosis not present

## 2021-02-03 DIAGNOSIS — R278 Other lack of coordination: Secondary | ICD-10-CM | POA: Diagnosis not present

## 2021-02-03 DIAGNOSIS — H3530 Unspecified macular degeneration: Secondary | ICD-10-CM | POA: Diagnosis not present

## 2021-02-03 DIAGNOSIS — R2681 Unsteadiness on feet: Secondary | ICD-10-CM | POA: Diagnosis not present

## 2021-02-03 DIAGNOSIS — M6281 Muscle weakness (generalized): Secondary | ICD-10-CM | POA: Diagnosis not present

## 2021-02-03 DIAGNOSIS — I69319 Unspecified symptoms and signs involving cognitive functions following cerebral infarction: Secondary | ICD-10-CM | POA: Diagnosis not present

## 2021-02-04 DIAGNOSIS — M6281 Muscle weakness (generalized): Secondary | ICD-10-CM | POA: Diagnosis not present

## 2021-02-04 DIAGNOSIS — I69319 Unspecified symptoms and signs involving cognitive functions following cerebral infarction: Secondary | ICD-10-CM | POA: Diagnosis not present

## 2021-02-04 DIAGNOSIS — R2681 Unsteadiness on feet: Secondary | ICD-10-CM | POA: Diagnosis not present

## 2021-02-04 DIAGNOSIS — R1312 Dysphagia, oropharyngeal phase: Secondary | ICD-10-CM | POA: Diagnosis not present

## 2021-02-04 DIAGNOSIS — I613 Nontraumatic intracerebral hemorrhage in brain stem: Secondary | ICD-10-CM | POA: Diagnosis not present

## 2021-02-04 DIAGNOSIS — R2689 Other abnormalities of gait and mobility: Secondary | ICD-10-CM | POA: Diagnosis not present

## 2021-02-04 DIAGNOSIS — J9601 Acute respiratory failure with hypoxia: Secondary | ICD-10-CM | POA: Diagnosis not present

## 2021-02-04 DIAGNOSIS — H3530 Unspecified macular degeneration: Secondary | ICD-10-CM | POA: Diagnosis not present

## 2021-02-04 DIAGNOSIS — R278 Other lack of coordination: Secondary | ICD-10-CM | POA: Diagnosis not present

## 2021-02-05 DIAGNOSIS — M6281 Muscle weakness (generalized): Secondary | ICD-10-CM | POA: Diagnosis not present

## 2021-02-05 DIAGNOSIS — I613 Nontraumatic intracerebral hemorrhage in brain stem: Secondary | ICD-10-CM | POA: Diagnosis not present

## 2021-02-05 DIAGNOSIS — I69319 Unspecified symptoms and signs involving cognitive functions following cerebral infarction: Secondary | ICD-10-CM | POA: Diagnosis not present

## 2021-02-05 DIAGNOSIS — R1312 Dysphagia, oropharyngeal phase: Secondary | ICD-10-CM | POA: Diagnosis not present

## 2021-02-05 DIAGNOSIS — R2681 Unsteadiness on feet: Secondary | ICD-10-CM | POA: Diagnosis not present

## 2021-02-05 DIAGNOSIS — R278 Other lack of coordination: Secondary | ICD-10-CM | POA: Diagnosis not present

## 2021-02-05 DIAGNOSIS — R2689 Other abnormalities of gait and mobility: Secondary | ICD-10-CM | POA: Diagnosis not present

## 2021-02-05 DIAGNOSIS — J9601 Acute respiratory failure with hypoxia: Secondary | ICD-10-CM | POA: Diagnosis not present

## 2021-02-05 DIAGNOSIS — H3530 Unspecified macular degeneration: Secondary | ICD-10-CM | POA: Diagnosis not present

## 2021-02-08 DIAGNOSIS — R2689 Other abnormalities of gait and mobility: Secondary | ICD-10-CM | POA: Diagnosis not present

## 2021-02-08 DIAGNOSIS — R278 Other lack of coordination: Secondary | ICD-10-CM | POA: Diagnosis not present

## 2021-02-08 DIAGNOSIS — M6281 Muscle weakness (generalized): Secondary | ICD-10-CM | POA: Diagnosis not present

## 2021-02-08 DIAGNOSIS — I613 Nontraumatic intracerebral hemorrhage in brain stem: Secondary | ICD-10-CM | POA: Diagnosis not present

## 2021-02-08 DIAGNOSIS — R110 Nausea: Secondary | ICD-10-CM | POA: Diagnosis not present

## 2021-02-08 DIAGNOSIS — G8929 Other chronic pain: Secondary | ICD-10-CM | POA: Diagnosis not present

## 2021-02-08 DIAGNOSIS — J9601 Acute respiratory failure with hypoxia: Secondary | ICD-10-CM | POA: Diagnosis not present

## 2021-02-08 DIAGNOSIS — Z515 Encounter for palliative care: Secondary | ICD-10-CM | POA: Diagnosis not present

## 2021-02-08 DIAGNOSIS — R1312 Dysphagia, oropharyngeal phase: Secondary | ICD-10-CM | POA: Diagnosis not present

## 2021-02-08 DIAGNOSIS — H3530 Unspecified macular degeneration: Secondary | ICD-10-CM | POA: Diagnosis not present

## 2021-02-08 DIAGNOSIS — I69319 Unspecified symptoms and signs involving cognitive functions following cerebral infarction: Secondary | ICD-10-CM | POA: Diagnosis not present

## 2021-02-08 DIAGNOSIS — R2681 Unsteadiness on feet: Secondary | ICD-10-CM | POA: Diagnosis not present

## 2021-02-09 DIAGNOSIS — R2689 Other abnormalities of gait and mobility: Secondary | ICD-10-CM | POA: Diagnosis not present

## 2021-02-09 DIAGNOSIS — I69319 Unspecified symptoms and signs involving cognitive functions following cerebral infarction: Secondary | ICD-10-CM | POA: Diagnosis not present

## 2021-02-09 DIAGNOSIS — N319 Neuromuscular dysfunction of bladder, unspecified: Secondary | ICD-10-CM | POA: Diagnosis not present

## 2021-02-09 DIAGNOSIS — R1312 Dysphagia, oropharyngeal phase: Secondary | ICD-10-CM | POA: Diagnosis not present

## 2021-02-09 DIAGNOSIS — I613 Nontraumatic intracerebral hemorrhage in brain stem: Secondary | ICD-10-CM | POA: Diagnosis not present

## 2021-02-09 DIAGNOSIS — R2681 Unsteadiness on feet: Secondary | ICD-10-CM | POA: Diagnosis not present

## 2021-02-09 DIAGNOSIS — R278 Other lack of coordination: Secondary | ICD-10-CM | POA: Diagnosis not present

## 2021-02-09 DIAGNOSIS — H3530 Unspecified macular degeneration: Secondary | ICD-10-CM | POA: Diagnosis not present

## 2021-02-09 DIAGNOSIS — J9601 Acute respiratory failure with hypoxia: Secondary | ICD-10-CM | POA: Diagnosis not present

## 2021-02-09 DIAGNOSIS — M6281 Muscle weakness (generalized): Secondary | ICD-10-CM | POA: Diagnosis not present

## 2021-02-10 DIAGNOSIS — H3530 Unspecified macular degeneration: Secondary | ICD-10-CM | POA: Diagnosis not present

## 2021-02-10 DIAGNOSIS — R2681 Unsteadiness on feet: Secondary | ICD-10-CM | POA: Diagnosis not present

## 2021-02-10 DIAGNOSIS — R278 Other lack of coordination: Secondary | ICD-10-CM | POA: Diagnosis not present

## 2021-02-10 DIAGNOSIS — I613 Nontraumatic intracerebral hemorrhage in brain stem: Secondary | ICD-10-CM | POA: Diagnosis not present

## 2021-02-10 DIAGNOSIS — R1312 Dysphagia, oropharyngeal phase: Secondary | ICD-10-CM | POA: Diagnosis not present

## 2021-02-10 DIAGNOSIS — I69319 Unspecified symptoms and signs involving cognitive functions following cerebral infarction: Secondary | ICD-10-CM | POA: Diagnosis not present

## 2021-02-10 DIAGNOSIS — M6281 Muscle weakness (generalized): Secondary | ICD-10-CM | POA: Diagnosis not present

## 2021-02-10 DIAGNOSIS — J9601 Acute respiratory failure with hypoxia: Secondary | ICD-10-CM | POA: Diagnosis not present

## 2021-02-10 DIAGNOSIS — R2689 Other abnormalities of gait and mobility: Secondary | ICD-10-CM | POA: Diagnosis not present

## 2021-02-11 DIAGNOSIS — M6281 Muscle weakness (generalized): Secondary | ICD-10-CM | POA: Diagnosis not present

## 2021-02-11 DIAGNOSIS — R2689 Other abnormalities of gait and mobility: Secondary | ICD-10-CM | POA: Diagnosis not present

## 2021-02-11 DIAGNOSIS — R1312 Dysphagia, oropharyngeal phase: Secondary | ICD-10-CM | POA: Diagnosis not present

## 2021-02-11 DIAGNOSIS — I69319 Unspecified symptoms and signs involving cognitive functions following cerebral infarction: Secondary | ICD-10-CM | POA: Diagnosis not present

## 2021-02-11 DIAGNOSIS — J9601 Acute respiratory failure with hypoxia: Secondary | ICD-10-CM | POA: Diagnosis not present

## 2021-02-11 DIAGNOSIS — R278 Other lack of coordination: Secondary | ICD-10-CM | POA: Diagnosis not present

## 2021-02-11 DIAGNOSIS — H3530 Unspecified macular degeneration: Secondary | ICD-10-CM | POA: Diagnosis not present

## 2021-02-11 DIAGNOSIS — R2681 Unsteadiness on feet: Secondary | ICD-10-CM | POA: Diagnosis not present

## 2021-02-11 DIAGNOSIS — I613 Nontraumatic intracerebral hemorrhage in brain stem: Secondary | ICD-10-CM | POA: Diagnosis not present

## 2021-02-12 DIAGNOSIS — J9601 Acute respiratory failure with hypoxia: Secondary | ICD-10-CM | POA: Diagnosis not present

## 2021-02-12 DIAGNOSIS — I613 Nontraumatic intracerebral hemorrhage in brain stem: Secondary | ICD-10-CM | POA: Diagnosis not present

## 2021-02-12 DIAGNOSIS — R2681 Unsteadiness on feet: Secondary | ICD-10-CM | POA: Diagnosis not present

## 2021-02-12 DIAGNOSIS — R2689 Other abnormalities of gait and mobility: Secondary | ICD-10-CM | POA: Diagnosis not present

## 2021-02-12 DIAGNOSIS — R1312 Dysphagia, oropharyngeal phase: Secondary | ICD-10-CM | POA: Diagnosis not present

## 2021-02-12 DIAGNOSIS — R278 Other lack of coordination: Secondary | ICD-10-CM | POA: Diagnosis not present

## 2021-02-12 DIAGNOSIS — H3530 Unspecified macular degeneration: Secondary | ICD-10-CM | POA: Diagnosis not present

## 2021-02-12 DIAGNOSIS — I69319 Unspecified symptoms and signs involving cognitive functions following cerebral infarction: Secondary | ICD-10-CM | POA: Diagnosis not present

## 2021-02-12 DIAGNOSIS — M6281 Muscle weakness (generalized): Secondary | ICD-10-CM | POA: Diagnosis not present

## 2021-02-15 DIAGNOSIS — R278 Other lack of coordination: Secondary | ICD-10-CM | POA: Diagnosis not present

## 2021-02-15 DIAGNOSIS — M6281 Muscle weakness (generalized): Secondary | ICD-10-CM | POA: Diagnosis not present

## 2021-02-15 DIAGNOSIS — I69319 Unspecified symptoms and signs involving cognitive functions following cerebral infarction: Secondary | ICD-10-CM | POA: Diagnosis not present

## 2021-02-15 DIAGNOSIS — J9601 Acute respiratory failure with hypoxia: Secondary | ICD-10-CM | POA: Diagnosis not present

## 2021-02-15 DIAGNOSIS — R2689 Other abnormalities of gait and mobility: Secondary | ICD-10-CM | POA: Diagnosis not present

## 2021-02-15 DIAGNOSIS — R1312 Dysphagia, oropharyngeal phase: Secondary | ICD-10-CM | POA: Diagnosis not present

## 2021-02-15 DIAGNOSIS — R2681 Unsteadiness on feet: Secondary | ICD-10-CM | POA: Diagnosis not present

## 2021-02-15 DIAGNOSIS — I613 Nontraumatic intracerebral hemorrhage in brain stem: Secondary | ICD-10-CM | POA: Diagnosis not present

## 2021-02-15 DIAGNOSIS — H3530 Unspecified macular degeneration: Secondary | ICD-10-CM | POA: Diagnosis not present

## 2021-02-16 DIAGNOSIS — R2681 Unsteadiness on feet: Secondary | ICD-10-CM | POA: Diagnosis not present

## 2021-02-16 DIAGNOSIS — R2689 Other abnormalities of gait and mobility: Secondary | ICD-10-CM | POA: Diagnosis not present

## 2021-02-16 DIAGNOSIS — M6281 Muscle weakness (generalized): Secondary | ICD-10-CM | POA: Diagnosis not present

## 2021-02-16 DIAGNOSIS — R1312 Dysphagia, oropharyngeal phase: Secondary | ICD-10-CM | POA: Diagnosis not present

## 2021-02-16 DIAGNOSIS — R278 Other lack of coordination: Secondary | ICD-10-CM | POA: Diagnosis not present

## 2021-02-16 DIAGNOSIS — J9601 Acute respiratory failure with hypoxia: Secondary | ICD-10-CM | POA: Diagnosis not present

## 2021-02-16 DIAGNOSIS — I613 Nontraumatic intracerebral hemorrhage in brain stem: Secondary | ICD-10-CM | POA: Diagnosis not present

## 2021-02-16 DIAGNOSIS — H3530 Unspecified macular degeneration: Secondary | ICD-10-CM | POA: Diagnosis not present

## 2021-02-16 DIAGNOSIS — I69319 Unspecified symptoms and signs involving cognitive functions following cerebral infarction: Secondary | ICD-10-CM | POA: Diagnosis not present

## 2021-02-17 DIAGNOSIS — J9601 Acute respiratory failure with hypoxia: Secondary | ICD-10-CM | POA: Diagnosis not present

## 2021-02-17 DIAGNOSIS — R1312 Dysphagia, oropharyngeal phase: Secondary | ICD-10-CM | POA: Diagnosis not present

## 2021-02-17 DIAGNOSIS — M6281 Muscle weakness (generalized): Secondary | ICD-10-CM | POA: Diagnosis not present

## 2021-02-17 DIAGNOSIS — I613 Nontraumatic intracerebral hemorrhage in brain stem: Secondary | ICD-10-CM | POA: Diagnosis not present

## 2021-02-17 DIAGNOSIS — R2681 Unsteadiness on feet: Secondary | ICD-10-CM | POA: Diagnosis not present

## 2021-02-17 DIAGNOSIS — R2689 Other abnormalities of gait and mobility: Secondary | ICD-10-CM | POA: Diagnosis not present

## 2021-02-17 DIAGNOSIS — R278 Other lack of coordination: Secondary | ICD-10-CM | POA: Diagnosis not present

## 2021-02-17 DIAGNOSIS — I69319 Unspecified symptoms and signs involving cognitive functions following cerebral infarction: Secondary | ICD-10-CM | POA: Diagnosis not present

## 2021-02-17 DIAGNOSIS — H3530 Unspecified macular degeneration: Secondary | ICD-10-CM | POA: Diagnosis not present

## 2021-02-18 DIAGNOSIS — I613 Nontraumatic intracerebral hemorrhage in brain stem: Secondary | ICD-10-CM | POA: Diagnosis not present

## 2021-02-18 DIAGNOSIS — J9601 Acute respiratory failure with hypoxia: Secondary | ICD-10-CM | POA: Diagnosis not present

## 2021-02-18 DIAGNOSIS — R1312 Dysphagia, oropharyngeal phase: Secondary | ICD-10-CM | POA: Diagnosis not present

## 2021-02-18 DIAGNOSIS — I69319 Unspecified symptoms and signs involving cognitive functions following cerebral infarction: Secondary | ICD-10-CM | POA: Diagnosis not present

## 2021-02-18 DIAGNOSIS — R278 Other lack of coordination: Secondary | ICD-10-CM | POA: Diagnosis not present

## 2021-02-18 DIAGNOSIS — R2689 Other abnormalities of gait and mobility: Secondary | ICD-10-CM | POA: Diagnosis not present

## 2021-02-18 DIAGNOSIS — H3530 Unspecified macular degeneration: Secondary | ICD-10-CM | POA: Diagnosis not present

## 2021-02-18 DIAGNOSIS — R2681 Unsteadiness on feet: Secondary | ICD-10-CM | POA: Diagnosis not present

## 2021-02-18 DIAGNOSIS — M6281 Muscle weakness (generalized): Secondary | ICD-10-CM | POA: Diagnosis not present

## 2021-02-19 DIAGNOSIS — H3530 Unspecified macular degeneration: Secondary | ICD-10-CM | POA: Diagnosis not present

## 2021-02-19 DIAGNOSIS — I69319 Unspecified symptoms and signs involving cognitive functions following cerebral infarction: Secondary | ICD-10-CM | POA: Diagnosis not present

## 2021-02-19 DIAGNOSIS — R2689 Other abnormalities of gait and mobility: Secondary | ICD-10-CM | POA: Diagnosis not present

## 2021-02-19 DIAGNOSIS — R2681 Unsteadiness on feet: Secondary | ICD-10-CM | POA: Diagnosis not present

## 2021-02-19 DIAGNOSIS — R1312 Dysphagia, oropharyngeal phase: Secondary | ICD-10-CM | POA: Diagnosis not present

## 2021-02-19 DIAGNOSIS — R278 Other lack of coordination: Secondary | ICD-10-CM | POA: Diagnosis not present

## 2021-02-19 DIAGNOSIS — M6281 Muscle weakness (generalized): Secondary | ICD-10-CM | POA: Diagnosis not present

## 2021-02-19 DIAGNOSIS — I613 Nontraumatic intracerebral hemorrhage in brain stem: Secondary | ICD-10-CM | POA: Diagnosis not present

## 2021-02-19 DIAGNOSIS — J9601 Acute respiratory failure with hypoxia: Secondary | ICD-10-CM | POA: Diagnosis not present

## 2021-02-22 DIAGNOSIS — R2689 Other abnormalities of gait and mobility: Secondary | ICD-10-CM | POA: Diagnosis not present

## 2021-02-22 DIAGNOSIS — I613 Nontraumatic intracerebral hemorrhage in brain stem: Secondary | ICD-10-CM | POA: Diagnosis not present

## 2021-02-22 DIAGNOSIS — R278 Other lack of coordination: Secondary | ICD-10-CM | POA: Diagnosis not present

## 2021-02-22 DIAGNOSIS — R319 Hematuria, unspecified: Secondary | ICD-10-CM | POA: Diagnosis not present

## 2021-02-22 DIAGNOSIS — M6281 Muscle weakness (generalized): Secondary | ICD-10-CM | POA: Diagnosis not present

## 2021-02-22 DIAGNOSIS — R2681 Unsteadiness on feet: Secondary | ICD-10-CM | POA: Diagnosis not present

## 2021-02-22 DIAGNOSIS — Z9359 Other cystostomy status: Secondary | ICD-10-CM | POA: Diagnosis not present

## 2021-02-23 DIAGNOSIS — I10 Essential (primary) hypertension: Secondary | ICD-10-CM | POA: Diagnosis not present

## 2021-02-23 DIAGNOSIS — I613 Nontraumatic intracerebral hemorrhage in brain stem: Secondary | ICD-10-CM | POA: Diagnosis not present

## 2021-02-23 DIAGNOSIS — R2689 Other abnormalities of gait and mobility: Secondary | ICD-10-CM | POA: Diagnosis not present

## 2021-02-23 DIAGNOSIS — R278 Other lack of coordination: Secondary | ICD-10-CM | POA: Diagnosis not present

## 2021-02-23 DIAGNOSIS — M6281 Muscle weakness (generalized): Secondary | ICD-10-CM | POA: Diagnosis not present

## 2021-02-23 DIAGNOSIS — Z9359 Other cystostomy status: Secondary | ICD-10-CM | POA: Diagnosis not present

## 2021-02-23 DIAGNOSIS — Z515 Encounter for palliative care: Secondary | ICD-10-CM | POA: Diagnosis not present

## 2021-02-23 DIAGNOSIS — R2681 Unsteadiness on feet: Secondary | ICD-10-CM | POA: Diagnosis not present

## 2021-02-24 DIAGNOSIS — M6281 Muscle weakness (generalized): Secondary | ICD-10-CM | POA: Diagnosis not present

## 2021-02-24 DIAGNOSIS — R2681 Unsteadiness on feet: Secondary | ICD-10-CM | POA: Diagnosis not present

## 2021-02-24 DIAGNOSIS — R2689 Other abnormalities of gait and mobility: Secondary | ICD-10-CM | POA: Diagnosis not present

## 2021-02-24 DIAGNOSIS — I613 Nontraumatic intracerebral hemorrhage in brain stem: Secondary | ICD-10-CM | POA: Diagnosis not present

## 2021-02-24 DIAGNOSIS — R278 Other lack of coordination: Secondary | ICD-10-CM | POA: Diagnosis not present

## 2021-02-25 DIAGNOSIS — R278 Other lack of coordination: Secondary | ICD-10-CM | POA: Diagnosis not present

## 2021-02-25 DIAGNOSIS — R2689 Other abnormalities of gait and mobility: Secondary | ICD-10-CM | POA: Diagnosis not present

## 2021-02-25 DIAGNOSIS — R2681 Unsteadiness on feet: Secondary | ICD-10-CM | POA: Diagnosis not present

## 2021-02-25 DIAGNOSIS — I613 Nontraumatic intracerebral hemorrhage in brain stem: Secondary | ICD-10-CM | POA: Diagnosis not present

## 2021-02-25 DIAGNOSIS — M6281 Muscle weakness (generalized): Secondary | ICD-10-CM | POA: Diagnosis not present

## 2021-02-26 DIAGNOSIS — R2681 Unsteadiness on feet: Secondary | ICD-10-CM | POA: Diagnosis not present

## 2021-02-26 DIAGNOSIS — M6281 Muscle weakness (generalized): Secondary | ICD-10-CM | POA: Diagnosis not present

## 2021-02-26 DIAGNOSIS — R2689 Other abnormalities of gait and mobility: Secondary | ICD-10-CM | POA: Diagnosis not present

## 2021-02-26 DIAGNOSIS — R278 Other lack of coordination: Secondary | ICD-10-CM | POA: Diagnosis not present

## 2021-02-26 DIAGNOSIS — I613 Nontraumatic intracerebral hemorrhage in brain stem: Secondary | ICD-10-CM | POA: Diagnosis not present

## 2021-03-01 DIAGNOSIS — R278 Other lack of coordination: Secondary | ICD-10-CM | POA: Diagnosis not present

## 2021-03-01 DIAGNOSIS — R2681 Unsteadiness on feet: Secondary | ICD-10-CM | POA: Diagnosis not present

## 2021-03-01 DIAGNOSIS — R2689 Other abnormalities of gait and mobility: Secondary | ICD-10-CM | POA: Diagnosis not present

## 2021-03-01 DIAGNOSIS — I613 Nontraumatic intracerebral hemorrhage in brain stem: Secondary | ICD-10-CM | POA: Diagnosis not present

## 2021-03-01 DIAGNOSIS — M6281 Muscle weakness (generalized): Secondary | ICD-10-CM | POA: Diagnosis not present

## 2021-03-02 DIAGNOSIS — M6281 Muscle weakness (generalized): Secondary | ICD-10-CM | POA: Diagnosis not present

## 2021-03-02 DIAGNOSIS — I613 Nontraumatic intracerebral hemorrhage in brain stem: Secondary | ICD-10-CM | POA: Diagnosis not present

## 2021-03-02 DIAGNOSIS — R278 Other lack of coordination: Secondary | ICD-10-CM | POA: Diagnosis not present

## 2021-03-02 DIAGNOSIS — R2689 Other abnormalities of gait and mobility: Secondary | ICD-10-CM | POA: Diagnosis not present

## 2021-03-02 DIAGNOSIS — R2681 Unsteadiness on feet: Secondary | ICD-10-CM | POA: Diagnosis not present

## 2021-03-03 DIAGNOSIS — R2681 Unsteadiness on feet: Secondary | ICD-10-CM | POA: Diagnosis not present

## 2021-03-03 DIAGNOSIS — R278 Other lack of coordination: Secondary | ICD-10-CM | POA: Diagnosis not present

## 2021-03-03 DIAGNOSIS — M6281 Muscle weakness (generalized): Secondary | ICD-10-CM | POA: Diagnosis not present

## 2021-03-03 DIAGNOSIS — I613 Nontraumatic intracerebral hemorrhage in brain stem: Secondary | ICD-10-CM | POA: Diagnosis not present

## 2021-03-03 DIAGNOSIS — R2689 Other abnormalities of gait and mobility: Secondary | ICD-10-CM | POA: Diagnosis not present

## 2021-03-04 DIAGNOSIS — M6281 Muscle weakness (generalized): Secondary | ICD-10-CM | POA: Diagnosis not present

## 2021-03-04 DIAGNOSIS — R2689 Other abnormalities of gait and mobility: Secondary | ICD-10-CM | POA: Diagnosis not present

## 2021-03-04 DIAGNOSIS — R2681 Unsteadiness on feet: Secondary | ICD-10-CM | POA: Diagnosis not present

## 2021-03-04 DIAGNOSIS — R278 Other lack of coordination: Secondary | ICD-10-CM | POA: Diagnosis not present

## 2021-03-04 DIAGNOSIS — I613 Nontraumatic intracerebral hemorrhage in brain stem: Secondary | ICD-10-CM | POA: Diagnosis not present

## 2021-03-05 DIAGNOSIS — R2689 Other abnormalities of gait and mobility: Secondary | ICD-10-CM | POA: Diagnosis not present

## 2021-03-05 DIAGNOSIS — R278 Other lack of coordination: Secondary | ICD-10-CM | POA: Diagnosis not present

## 2021-03-05 DIAGNOSIS — I613 Nontraumatic intracerebral hemorrhage in brain stem: Secondary | ICD-10-CM | POA: Diagnosis not present

## 2021-03-05 DIAGNOSIS — N390 Urinary tract infection, site not specified: Secondary | ICD-10-CM | POA: Diagnosis not present

## 2021-03-05 DIAGNOSIS — M6281 Muscle weakness (generalized): Secondary | ICD-10-CM | POA: Diagnosis not present

## 2021-03-05 DIAGNOSIS — R2681 Unsteadiness on feet: Secondary | ICD-10-CM | POA: Diagnosis not present

## 2021-03-08 DIAGNOSIS — R2681 Unsteadiness on feet: Secondary | ICD-10-CM | POA: Diagnosis not present

## 2021-03-08 DIAGNOSIS — R8290 Unspecified abnormal findings in urine: Secondary | ICD-10-CM | POA: Diagnosis not present

## 2021-03-08 DIAGNOSIS — R2689 Other abnormalities of gait and mobility: Secondary | ICD-10-CM | POA: Diagnosis not present

## 2021-03-08 DIAGNOSIS — M6281 Muscle weakness (generalized): Secondary | ICD-10-CM | POA: Diagnosis not present

## 2021-03-08 DIAGNOSIS — I613 Nontraumatic intracerebral hemorrhage in brain stem: Secondary | ICD-10-CM | POA: Diagnosis not present

## 2021-03-08 DIAGNOSIS — R278 Other lack of coordination: Secondary | ICD-10-CM | POA: Diagnosis not present

## 2021-03-08 DIAGNOSIS — T83090A Other mechanical complication of cystostomy catheter, initial encounter: Secondary | ICD-10-CM | POA: Diagnosis not present

## 2021-03-12 DIAGNOSIS — Z9359 Other cystostomy status: Secondary | ICD-10-CM | POA: Diagnosis not present

## 2021-03-12 DIAGNOSIS — R8271 Bacteriuria: Secondary | ICD-10-CM | POA: Diagnosis not present

## 2021-04-27 DIAGNOSIS — R532 Functional quadriplegia: Secondary | ICD-10-CM | POA: Diagnosis not present

## 2021-04-27 DIAGNOSIS — U071 COVID-19: Secondary | ICD-10-CM | POA: Diagnosis not present

## 2021-04-27 DIAGNOSIS — Z515 Encounter for palliative care: Secondary | ICD-10-CM | POA: Diagnosis not present

## 2021-04-29 DIAGNOSIS — J029 Acute pharyngitis, unspecified: Secondary | ICD-10-CM | POA: Diagnosis not present

## 2021-04-29 DIAGNOSIS — Z515 Encounter for palliative care: Secondary | ICD-10-CM | POA: Diagnosis not present

## 2021-04-29 DIAGNOSIS — U071 COVID-19: Secondary | ICD-10-CM | POA: Diagnosis not present

## 2021-05-04 DIAGNOSIS — L89150 Pressure ulcer of sacral region, unstageable: Secondary | ICD-10-CM | POA: Diagnosis not present

## 2021-05-04 DIAGNOSIS — F482 Pseudobulbar affect: Secondary | ICD-10-CM | POA: Diagnosis not present

## 2021-05-04 DIAGNOSIS — E46 Unspecified protein-calorie malnutrition: Secondary | ICD-10-CM | POA: Diagnosis not present

## 2021-05-04 DIAGNOSIS — R1310 Dysphagia, unspecified: Secondary | ICD-10-CM | POA: Diagnosis not present

## 2021-05-04 DIAGNOSIS — N390 Urinary tract infection, site not specified: Secondary | ICD-10-CM | POA: Diagnosis not present

## 2021-05-04 DIAGNOSIS — Z9359 Other cystostomy status: Secondary | ICD-10-CM | POA: Diagnosis not present

## 2021-05-04 DIAGNOSIS — Z515 Encounter for palliative care: Secondary | ICD-10-CM | POA: Diagnosis not present

## 2021-05-04 DIAGNOSIS — I679 Cerebrovascular disease, unspecified: Secondary | ICD-10-CM | POA: Diagnosis not present

## 2021-05-04 DIAGNOSIS — U071 COVID-19: Secondary | ICD-10-CM | POA: Diagnosis not present

## 2021-05-23 DEATH — deceased
